# Patient Record
Sex: Female | Born: 1935 | Race: White | Hispanic: No | Smoking: Never smoker
Health system: Southern US, Community
[De-identification: ages and names within clinical notes are randomized; demographics above are authoritative.]

## PROBLEM LIST (undated history)

## (undated) DIAGNOSIS — B029 Zoster without complications: Secondary | ICD-10-CM

## (undated) DIAGNOSIS — E785 Hyperlipidemia, unspecified: Secondary | ICD-10-CM

## (undated) DIAGNOSIS — E039 Hypothyroidism, unspecified: Secondary | ICD-10-CM

## (undated) DIAGNOSIS — I340 Nonrheumatic mitral (valve) insufficiency: Secondary | ICD-10-CM

## (undated) DIAGNOSIS — I10 Essential (primary) hypertension: Secondary | ICD-10-CM

## (undated) DIAGNOSIS — S12100A Unspecified displaced fracture of second cervical vertebra, initial encounter for closed fracture: Secondary | ICD-10-CM

## (undated) DIAGNOSIS — F32A Depression, unspecified: Secondary | ICD-10-CM

## (undated) DIAGNOSIS — C4490 Unspecified malignant neoplasm of skin, unspecified: Secondary | ICD-10-CM

## (undated) DIAGNOSIS — R569 Unspecified convulsions: Secondary | ICD-10-CM

## (undated) DIAGNOSIS — M1990 Unspecified osteoarthritis, unspecified site: Secondary | ICD-10-CM

## (undated) DIAGNOSIS — I071 Rheumatic tricuspid insufficiency: Secondary | ICD-10-CM

## (undated) DIAGNOSIS — F329 Major depressive disorder, single episode, unspecified: Secondary | ICD-10-CM

## (undated) DIAGNOSIS — I452 Bifascicular block: Secondary | ICD-10-CM

## (undated) DIAGNOSIS — S7292XA Unspecified fracture of left femur, initial encounter for closed fracture: Secondary | ICD-10-CM

## (undated) DIAGNOSIS — I272 Pulmonary hypertension, unspecified: Secondary | ICD-10-CM

## (undated) DIAGNOSIS — H547 Unspecified visual loss: Secondary | ICD-10-CM

## (undated) DIAGNOSIS — S73004A Unspecified dislocation of right hip, initial encounter: Secondary | ICD-10-CM

## (undated) DIAGNOSIS — I4891 Unspecified atrial fibrillation: Secondary | ICD-10-CM

## (undated) DIAGNOSIS — S7291XA Unspecified fracture of right femur, initial encounter for closed fracture: Secondary | ICD-10-CM

## (undated) DIAGNOSIS — I5042 Chronic combined systolic (congestive) and diastolic (congestive) heart failure: Secondary | ICD-10-CM

## (undated) DIAGNOSIS — R42 Dizziness and giddiness: Secondary | ICD-10-CM

## (undated) HISTORY — PX: BREAST CYST EXCISION: SHX579

## (undated) HISTORY — PX: TOTAL KNEE ARTHROPLASTY: SHX125

## (undated) HISTORY — PX: TOTAL HIP ARTHROPLASTY: SHX124

## (undated) HISTORY — PX: ROTATOR CUFF REPAIR: SHX139

## (undated) HISTORY — DX: Major depressive disorder, single episode, unspecified: F32.9

## (undated) HISTORY — DX: Hyperlipidemia, unspecified: E78.5

## (undated) HISTORY — PX: ABDOMINAL HYSTERECTOMY: SHX81

## (undated) HISTORY — PX: CHOLECYSTECTOMY: SHX55

## (undated) HISTORY — PX: CATARACT EXTRACTION, BILATERAL: SHX1313

## (undated) HISTORY — DX: Zoster without complications: B02.9

## (undated) HISTORY — DX: Unspecified malignant neoplasm of skin, unspecified: C44.90

## (undated) HISTORY — DX: Depression, unspecified: F32.A

## (undated) HISTORY — DX: Unspecified convulsions: R56.9

## (undated) HISTORY — PX: OTHER SURGICAL HISTORY: SHX169

## (undated) HISTORY — DX: Unspecified osteoarthritis, unspecified site: M19.90

## (undated) HISTORY — PX: CARPAL TUNNEL RELEASE: SHX101

---
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
## 1996-10-11 HISTORY — PX: SHOULDER ARTHROSCOPY: SHX128

## 1998-02-03 ENCOUNTER — Other Ambulatory Visit: Admission: RE | Admit: 1998-02-03 | Discharge: 1998-02-03 | Payer: Self-pay | Admitting: Endocrinology

## 1998-08-28 ENCOUNTER — Ambulatory Visit (HOSPITAL_COMMUNITY): Admission: RE | Admit: 1998-08-28 | Discharge: 1998-08-28 | Payer: Self-pay | Admitting: *Deleted

## 1999-02-05 ENCOUNTER — Other Ambulatory Visit: Admission: RE | Admit: 1999-02-05 | Discharge: 1999-02-05 | Payer: Self-pay | Admitting: Endocrinology

## 1999-07-05 ENCOUNTER — Encounter: Payer: Self-pay | Admitting: Emergency Medicine

## 1999-07-05 ENCOUNTER — Emergency Department (HOSPITAL_COMMUNITY): Admission: EM | Admit: 1999-07-05 | Discharge: 1999-07-05 | Payer: Self-pay | Admitting: Emergency Medicine

## 1999-07-10 ENCOUNTER — Inpatient Hospital Stay (HOSPITAL_COMMUNITY): Admission: EM | Admit: 1999-07-10 | Discharge: 1999-07-11 | Payer: Self-pay | Admitting: Emergency Medicine

## 1999-07-11 ENCOUNTER — Encounter: Payer: Self-pay | Admitting: *Deleted

## 1999-07-11 ENCOUNTER — Encounter: Payer: Self-pay | Admitting: Emergency Medicine

## 2000-02-03 ENCOUNTER — Ambulatory Visit (HOSPITAL_COMMUNITY): Admission: RE | Admit: 2000-02-03 | Discharge: 2000-02-03 | Payer: Self-pay | Admitting: Endocrinology

## 2000-02-03 ENCOUNTER — Encounter: Payer: Self-pay | Admitting: Endocrinology

## 2000-03-01 ENCOUNTER — Other Ambulatory Visit: Admission: RE | Admit: 2000-03-01 | Discharge: 2000-03-01 | Payer: Self-pay | Admitting: Endocrinology

## 2001-02-14 ENCOUNTER — Ambulatory Visit (HOSPITAL_COMMUNITY): Admission: RE | Admit: 2001-02-14 | Discharge: 2001-02-14 | Payer: Self-pay | Admitting: Family Medicine

## 2001-02-14 ENCOUNTER — Encounter: Payer: Self-pay | Admitting: Family Medicine

## 2002-08-13 ENCOUNTER — Ambulatory Visit (HOSPITAL_COMMUNITY): Admission: RE | Admit: 2002-08-13 | Discharge: 2002-08-13 | Payer: Self-pay | Admitting: *Deleted

## 2002-08-13 ENCOUNTER — Encounter: Payer: Self-pay | Admitting: *Deleted

## 2003-05-31 ENCOUNTER — Emergency Department (HOSPITAL_COMMUNITY): Admission: EM | Admit: 2003-05-31 | Discharge: 2003-05-31 | Payer: Self-pay | Admitting: Emergency Medicine

## 2003-09-26 ENCOUNTER — Encounter: Admission: RE | Admit: 2003-09-26 | Discharge: 2003-09-26 | Payer: Self-pay | Admitting: Orthopedic Surgery

## 2004-03-13 ENCOUNTER — Ambulatory Visit (HOSPITAL_COMMUNITY): Admission: RE | Admit: 2004-03-13 | Discharge: 2004-03-13 | Payer: Self-pay | Admitting: Family Medicine

## 2004-03-19 ENCOUNTER — Encounter: Admission: RE | Admit: 2004-03-19 | Discharge: 2004-03-19 | Payer: Self-pay | Admitting: Family Medicine

## 2004-07-16 ENCOUNTER — Encounter: Admission: RE | Admit: 2004-07-16 | Discharge: 2004-07-16 | Payer: Self-pay | Admitting: Orthopedic Surgery

## 2004-07-22 ENCOUNTER — Ambulatory Visit (HOSPITAL_BASED_OUTPATIENT_CLINIC_OR_DEPARTMENT_OTHER): Admission: RE | Admit: 2004-07-22 | Discharge: 2004-07-22 | Payer: Self-pay | Admitting: Orthopedic Surgery

## 2004-07-22 ENCOUNTER — Ambulatory Visit (HOSPITAL_COMMUNITY): Admission: RE | Admit: 2004-07-22 | Discharge: 2004-07-22 | Payer: Self-pay | Admitting: Orthopedic Surgery

## 2005-04-06 ENCOUNTER — Ambulatory Visit (HOSPITAL_COMMUNITY): Admission: RE | Admit: 2005-04-06 | Discharge: 2005-04-06 | Payer: Self-pay | Admitting: Family Medicine

## 2005-07-22 ENCOUNTER — Inpatient Hospital Stay (HOSPITAL_COMMUNITY): Admission: EM | Admit: 2005-07-22 | Discharge: 2005-07-24 | Payer: Self-pay | Admitting: Emergency Medicine

## 2006-05-26 ENCOUNTER — Ambulatory Visit (HOSPITAL_COMMUNITY): Admission: RE | Admit: 2006-05-26 | Discharge: 2006-05-26 | Payer: Self-pay | Admitting: Family Medicine

## 2007-03-23 ENCOUNTER — Ambulatory Visit (HOSPITAL_COMMUNITY): Admission: EM | Admit: 2007-03-23 | Discharge: 2007-03-23 | Payer: Self-pay | Admitting: Emergency Medicine

## 2007-06-26 ENCOUNTER — Ambulatory Visit (HOSPITAL_COMMUNITY): Admission: RE | Admit: 2007-06-26 | Discharge: 2007-06-26 | Payer: Self-pay | Admitting: Family Medicine

## 2008-07-18 ENCOUNTER — Ambulatory Visit (HOSPITAL_COMMUNITY): Admission: RE | Admit: 2008-07-18 | Discharge: 2008-07-18 | Payer: Self-pay | Admitting: Family Medicine

## 2010-05-09 ENCOUNTER — Inpatient Hospital Stay (HOSPITAL_COMMUNITY)
Admission: EM | Admit: 2010-05-09 | Discharge: 2010-05-10 | Payer: Self-pay | Source: Home / Self Care | Admitting: Emergency Medicine

## 2010-11-01 ENCOUNTER — Encounter: Payer: Self-pay | Admitting: Family Medicine

## 2010-12-26 LAB — APTT: aPTT: 35 seconds (ref 24–37)

## 2010-12-26 LAB — BASIC METABOLIC PANEL
BUN: 14 mg/dL (ref 6–23)
CO2: 31 mEq/L (ref 19–32)
Calcium: 8.7 mg/dL (ref 8.4–10.5)
Creatinine, Ser: 0.6 mg/dL (ref 0.4–1.2)
GFR calc Af Amer: >60 mL/min (ref >60)

## 2010-12-26 LAB — CBC
HCT: 37.5 % (ref 36.0–46.0)
HCT: 40.5 % (ref 36.0–46.0)
Hemoglobin: 12.9 g/dL (ref 12.0–15.0)
Hemoglobin: 13.6 g/dL (ref 12.0–15.0)
MCH: 32.6 pg (ref 26.0–34.0)
MCHC: 33.7 g/dL (ref 30.0–36.0)
MCHC: 34.4 g/dL (ref 30.0–36.0)
MCV: 96.9 fL (ref 78.0–100.0)
Platelets: 160 10*3/uL (ref 150–400)
RBC: 3.89 MIL/uL (ref 3.87–5.11)
RBC: 4.18 MIL/uL (ref 3.87–5.11)
RDW: 14 % (ref 11.5–15.5)
WBC: 4.6 10*3/uL (ref 4.0–10.5)

## 2010-12-26 LAB — COMPREHENSIVE METABOLIC PANEL
ALT: 16 U/L (ref 0–35)
AST: 27 U/L (ref 0–37)
Albumin: 3.7 g/dL (ref 3.5–5.2)
Alkaline Phosphatase: 81 U/L (ref 39–117)
BUN: 16 mg/dL (ref 6–23)
CO2: 31 mEq/L (ref 19–32)
Calcium: 9 mg/dL (ref 8.4–10.5)
Chloride: 99 mEq/L (ref 96–112)
Creatinine, Ser: 0.71 mg/dL (ref 0.4–1.2)
GFR calc Af Amer: >60 mL/min (ref >60)
GFR calc non Af Amer: >60 mL/min (ref >60)
Glucose, Bld: 109 mg/dL — ABNORMAL HIGH (ref 70–99)
Potassium: 3.6 mEq/L (ref 3.5–5.1)
Sodium: 139 mEq/L (ref 135–145)
Total Bilirubin: 0.5 mg/dL (ref 0.3–1.2)
Total Protein: 6.3 g/dL (ref 6.0–8.3)

## 2010-12-26 LAB — TYPE AND SCREEN
ABO/RH(D): A POS
Antibody Screen: NEGATIVE

## 2010-12-26 LAB — DIFFERENTIAL
Basophils Absolute: 0 10*3/uL (ref 0.0–0.1)
Basophils Relative: 1 % (ref 0–1)
Eosinophils Absolute: 0.3 10*3/uL (ref 0.0–0.7)
Eosinophils Relative: 6 % — ABNORMAL HIGH (ref 0–5)
Lymphocytes Relative: 36 % (ref 12–46)
Lymphs Abs: 1.6 10*3/uL (ref 0.7–4.0)
Monocytes Absolute: 0.4 10*3/uL (ref 0.1–1.0)
Monocytes Relative: 9 % (ref 3–12)
Neutro Abs: 2.3 10*3/uL (ref 1.7–7.7)
Neutrophils Relative %: 49 % (ref 43–77)

## 2010-12-26 LAB — PROTIME-INR
INR: 1.02 (ref 0.00–1.49)
Prothrombin Time: 13.3 seconds (ref 11.6–15.2)

## 2010-12-26 LAB — TSH
TSH: 5.456 u[IU]/mL — ABNORMAL HIGH (ref 0.350–4.500)
TSH: 6.341 u[IU]/mL — ABNORMAL HIGH (ref 0.350–4.500)

## 2010-12-26 LAB — SAMPLE TO BLOOD BANK

## 2010-12-26 LAB — ABO/RH: ABO/RH(D): A POS

## 2010-12-26 LAB — HEMOCCULT GUIAC POC 1CARD (OFFICE): Fecal Occult Bld: POSITIVE

## 2010-12-26 LAB — HEMOGLOBIN AND HEMATOCRIT, BLOOD
HCT: 38 % (ref 36.0–46.0)
HCT: 38.4 % (ref 36.0–46.0)
Hemoglobin: 13 g/dL (ref 12.0–15.0)
Hemoglobin: 13.1 g/dL (ref 12.0–15.0)

## 2010-12-26 LAB — PHENYTOIN LEVEL, TOTAL: Phenytoin Lvl: 9.7 ug/mL — ABNORMAL LOW (ref 10.0–20.0)

## 2011-02-23 NOTE — Op Note (Signed)
Erica [name], Erica [name]        ACCOUNT NO.:  [id number]   MEDICAL RECORD NO.:  [id number]          PATIENT TYPE:  INP   LOCATION:  0098                         FACILITY:  [facility]   PHYSICIAN:  Madlyn Frankel. Charlann Boxer, M.D.  DATE OF BIRTH:  10-04-1917   DATE OF PROCEDURE:  03/23/2007  DATE OF DISCHARGE:                               OPERATIVE REPORT   PREOPERATIVE DIAGNOSIS:  Dislocation right total hip replacement.   POSTOPERATIVE DIAGNOSIS:  Dislocation right total hip replacement.   PROCEDURE:  Closed reduction right total hip replacement.   SURGEON:  Madlyn Frankel. Charlann Boxer, M.D.   ASSISTANT:  None.   ANESTHESIA:  MAC.   COMPLICATIONS:  None.   INDICATIONS FOR PROCEDURE:  [name] is an 77 year old female with  a history of right total hip replacement performed by Dr. Ranee Gosselin.  She was out in her garden today when she bent down pick some  leaves up and felt a popping type sensation.  Her daughter lives next to  her and brought her to the emergency room after finding her down.  Radiographs note dislocation of the hip. She was seen and evaluated in  the emergency room  and brought to the operating room for closed  reduction.  The risks and benefits were discussed.   PROCEDURE IN DETAIL:  The patient was brought to the operating theater.  Once adequate anesthesia was established, flexion and internal rotation  of the hip and in line traction reduced the hip without complications.  Leg lengths were normal.  Hip range of motion was normal.  She was  placed in a knee immobilizer.  She was then awakened from anesthesia and  brought to the recovery room in stable condition.   I will plan for her to follow up in approximately 3-4 weeks with Dr.  Ranee Gosselin postoperatively.  She will be able to be discharged home.  She will be wearing the knee immobilizer full time until she is followed  up.      Madlyn Frankel Charlann Boxer, M.D.  Electronically Signed     MDO/MEDQ  D:   03/23/2007  T:  03/23/2007  Job:  [id number]   cc:   Windy Fast A. Darrelyn Hillock, M.D.  Fax: [phone]
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
## 2011-02-26 NOTE — Op Note (Signed)
Erica [name], Erica [name]        ACCOUNT NO.:  [id number]   MEDICAL RECORD NO.:  [id number]          PATIENT TYPE:  AMB   LOCATION:  NESC                         FACILITY:  [facility]   PHYSICIAN:  Georges Lynch. Gioffre, M.D.DATE OF BIRTH:  Jan 19, 1917   DATE OF PROCEDURE:  07/22/2004  DATE OF DISCHARGE:                                 OPERATIVE REPORT   PREOPERATIVE DIAGNOSIS:  Severe carpal tunnel syndrome on the left.   POSTOPERATIVE DIAGNOSIS:  Severe carpal tunnel syndrome on the left.   PROCEDURE:  Left carpal tunnel release.   SURGEON:  Georges Lynch. Darrelyn Hillock, M.D.   ASSISTANT:  Nurse.   ANESTHESIA:  General.   DESCRIPTION OF PROCEDURE:  Under general anesthesia, routine orthopedic  prepping and draping of the left upper extremity was carried out.  I  exsanguinated with the left arm with an Esmarch and elevated the tourniquet  to 225 mmHg.  At this time, a curvilinear incision was made over the flexor  aspect of the left wrist.  The few superficial bleeders were identified and  cauterized with the bipolar. Great care was taken not to injure the  underlying neurovascular structures.   I identified the superficial and deep transverse carpal ligaments.  I  protected the median nerve.  I incised the ligaments down into the palm.  The nerve now was free proximally and distally. I thoroughly irrigated out  the area and closed the skin only with 2-0 and 3-0 nylon sutures.  Sterile  Neosporin bundle dressings were applied.  The patient left the operating  room in satisfactory condition.      RAG/MEDQ  D:  07/22/2004  T:  07/22/2004  Job:  [id number]
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
## 2011-05-14 ENCOUNTER — Encounter (INDEPENDENT_AMBULATORY_CARE_PROVIDER_SITE_OTHER): Payer: Self-pay | Admitting: Surgery

## 2011-05-17 ENCOUNTER — Encounter (INDEPENDENT_AMBULATORY_CARE_PROVIDER_SITE_OTHER): Payer: Self-pay | Admitting: Surgery

## 2011-05-17 ENCOUNTER — Ambulatory Visit (INDEPENDENT_AMBULATORY_CARE_PROVIDER_SITE_OTHER): Payer: Medicare Other | Admitting: Surgery

## 2011-05-17 VITALS — BP 142/78 | HR 58 | Temp 97.2°F | Ht 64.0 in | Wt 173.2 lb

## 2011-05-17 DIAGNOSIS — L989 Disorder of the skin and subcutaneous tissue, unspecified: Secondary | ICD-10-CM

## 2011-05-17 NOTE — Progress Notes (Signed)
Chief Complaint  Patient presents with  . Other    new pt-cyst on rt calf    Erica [name] is a 77 y.o. white female who is a patient of Erica Hoit, MD and comes to me today for right posterior skin lesion.  The patient has had multiple prior skin cancers. She saw Dr. Leta [name] for a lesion on her posterior right leg. She's had this lesion for several months but were recently decided to have it addressed.  She's had some swelling of her right leg she says only for 3 weeks as this lesion has flared up.  She is accompanied by her daughter. Her daughter is morbidly obese and in a wheelchair.  Past Medical History  Diagnosis Date  . Hypertension   . Hemorrhoids   . Thyroid disease   . Osteoarthritis   . Seizures   . Depression   . Hyperlipidemia   . Wears glasses   . Cancer     skin  . Bruises easily     Past Surgical History  Procedure Date  . Cataract extraction, bilateral   . Carpal tunnel release     left hand  . Shoulder arthroscopy 1998    right  . Total hip arthroplasty     right  . Total knee arthroplasty     right   . Rotator cuff repair     left shoulder  . Total knee arthroplasty     left   . Cholecystectomy   . Breast cyst excision     left  . Abdominal hysterectomy   . Tonsellectomy     Current Outpatient Prescriptions  Medication Sig Dispense Refill  . amLODipine (NORVASC) 2.5 MG tablet Take 2.5 mg by mouth daily.        . B Complex-C (B-COMPLEX WITH VITAMIN C) tablet Take 1 tablet by mouth daily.        . calcium-vitamin D (OSCAL WITH D) 250-125 MG-UNIT per tablet Take 1 tablet by mouth daily.        . fexofenadine (ALLEGRA) 60 MG tablet Take 60 mg by mouth daily.        . Multiple Vitamins-Minerals (MULTIVITAMIN WITH MINERALS) tablet Take 1 tablet by mouth daily.        Marland Kitchen PARoxetine (PAXIL) 20 MG tablet Take 29 mg by mouth every morning.       . phenytoin (DILANTIN) 200 MG ER capsule Take 100 mg by mouth 2 (two) times daily.        . simvastatin (ZOCOR) 40 MG tablet Take 40 mg by mouth at bedtime.        Marland Kitchen spironolactone-hydrochlorothiazide (ALDACTAZIDE) 25-25 MG per tablet Take 1 tablet by mouth daily.        . ciprofloxacin (CIPRO) 750 MG tablet 750 mg Daily.      Marland Kitchen docusate sodium (COLACE) 100 MG capsule Take 100 mg by mouth 2 (two) times daily.        . hydrocortisone (ANUSOL-HC) 2.5 % rectal cream Place rectally as needed.        . naproxen sodium (ANAPROX) 220 MG tablet Take 220 mg by mouth 2 (two) times daily with a meal.        . SYNTHROID 112 MCG tablet Daily.        Allergies  Allergen Reactions  . Biaxin   . Detrol (Tolterodine Tartrate)   . Ditropan (Oxybutynin Chloride)   . Prednisone   . Vioxx (Rofecoxib)    Review of Systems:  Skin:  No history of rash.  Has had prior skin cancers. Infection:  No history of hepatitis.  No history of MRSA. Neurologic:  History of seizure in 2000.  Followed by [facility] (? Physician).  Erica [name], [place].  No history of headaches. History of depression (though she says she is not) Cardiac:  Hypertension for 10 years. No history of heart disease.  No history of prior cardiac catheterization.  No history of seeing a cardiologist. Pulmonary:  Does not smoke cigarettes.  No asthma or bronchitis.  No OSA/CPAP.  Endocrine:  No diabetes. On thyroid replacement.  Has hypercholesterolemia. Gastrointestinal:  No history of stomach disease.  No history of liver disease.  Cholecystectomy - 1979.  No history of pancreas disease.  No history of colon disease. Urologic:  No history of kidney stones.  No history of bladder infections. Musculoskeletal:  Has had bilateral knee replacement (1988 - left, 1991 - right). Hematologic:  No bleeding disorder.  No history of anemia.  Not anticoagulated.   SOCIAL and FAMILY HISTORY:  PHYSICAL EXAM: BP 142/78  Pulse 58  Temp 97.2 F (36.2 C)  Ht 5\' 4"  (1.626 m)  Wt 173 lb 4 oz (78.586 kg)  BMI 29.74 kg/m2  HEENT: Normal.  Pupils equal. Normal dentition.  Scars on face from prior excisions. Neck: Supple. No thyroid mass. Lymph Nodes:  No supraclavicular or cervical nodes. Lungs: Clear and symmetric. Heart:  RRR. No murmur. Abdomen: No mass. No tenderness. No hernia. Normal bowel sounds.  No abdominal scars. Rectal: Not done. Extremities:  Edema right leg > left leg.  2.0 x 3.5 cm lesion on posterior right leg. [photo taken]  Multiple scars from skin excisions. Neurologic:  Grossly intact to motor and sensory function.  Users a cane for balance.   DATA REVIEWED: Typed patient notes and Dr. Loyce [name] notes.  ASSESSMENT and PLAN: 1.  Right posterior skin lesion.  No pathology.  Probable SCCa.  Discussed surgery.  Risks include bleeding, wound problems, and inadequate excision.  2.  History of seizures. Remote. 3.  Hypertension. 4.  Hypercholesterolism. 5.  Thyroid replacement. 6.  Osteoarthritis.

## 2011-05-25 ENCOUNTER — Ambulatory Visit (HOSPITAL_BASED_OUTPATIENT_CLINIC_OR_DEPARTMENT_OTHER)
Admission: RE | Admit: 2011-05-25 | Discharge: 2011-05-25 | Disposition: A | Discharge status: Home / Self Care | Payer: Medicare Other | Source: Ambulatory Visit | Attending: Surgery | Admitting: Surgery

## 2011-05-25 ENCOUNTER — Other Ambulatory Visit (INDEPENDENT_AMBULATORY_CARE_PROVIDER_SITE_OTHER): Payer: Self-pay | Admitting: Surgery

## 2011-05-25 DIAGNOSIS — C44721 Squamous cell carcinoma of skin of unspecified lower limb, including hip: Secondary | ICD-10-CM

## 2011-05-25 HISTORY — PX: SKIN LESION EXCISION: SHX2412

## 2011-06-01 NOTE — Op Note (Signed)
  NAMECENIYAH, THORP          ACCOUNT NO.:  [id number]  MEDICAL RECORD NO.:  [id number]  LOCATION:                                 FACILITY:  PHYSICIAN:  Sandria Bales. Ezzard Standing, M.D.  DATE OF BIRTH:  [birth year]  DATE OF PROCEDURE: 25 May 2011                              OPERATIVE REPORT  PREOP DIAGNOSIS:  Lesion, posterior right calf.  POSTOP DIAGNOSIS:  A 2 x 3 cm lesion, posterior right calf.  Final path pending.  PROCEDURES:  Excision of lesion, posterior right calf with 8 cm in longest dimension of excision.  SURGEON:  Sandria Bales. Ezzard Standing, MD.  FIRST ASSISTANT:  None.  ANESTHESIA:  20 mL of 1% Xylocaine with epinephrine.  COMPLICATIONS:  None.  PROCEDURE:  Ms. Roda Lauture is a 77 year old white female, who sees Dr. Benedetto Goad as her primary medical doctor.  Dr. Leta Speller from a dermatology standpoint has this lesion of her right posterior calf with some swelling over her right leg, and now comes for excision of this lesion.  The indications and potential complications were explained to the patient and her daughter.  The potential complications include bleeding, infection, and inadequate excision of the lesion, and nerve injury.  OPERATIVE NOTE:  The patient in prone position with a right posterior calf prepped with Betadine solution. I cleaned off some dead skin and then reprepped again with Betadine solution.  She has this lesion, which measures about 2 x 3 cm on the right posterior calf, it  looks like a component be part of the sebaceous cyst, although there is some kind of an open ulcer, which was excised in one block.  I infiltrated with 20 mL of 1% Xylocaine with epinephrine.  I then made an incision approximately 8 x 3 cm.  I marked a long suture on the cranial aspect of the lesion, a short suture of the medial aspect to the lesion.  I then undermined the skin back about 2 cm on both sides so the skin would pull together.  I used 2-0 nylon sutures,  her skin is fairly fragile.  I got the wound closure to where the skin edges were approximated under a moderate amount of tension.  The wound looked good.  I put a gauze over the incision and then wrapped the leg with a 4-inch Kerlix and then a 6-inch Ace bandage.  She will keep this on her leg for least 3 days, keep her leg elevated. She does have some chronic edema, maybe 1+ of the right leg, this may be coming as far from this inflammation.  I think elevation will help keep this down.  She will see me back in 1 week for wound check.  She can shower in 3 days.    Sandria Bales. Ezzard Standing, M.D., FACS   DHN/MEDQ  D:  05/25/2011  T:  05/26/2011  Job:  [id number]  cc:   Gloriajean Dell. Andrey Campanile, M.D. Norval Gable. [place], M.D.  Electronically Signed by Ovidio Kin M.D. on 06/01/2011 29:56:21 PM

## 2011-06-02 ENCOUNTER — Ambulatory Visit (INDEPENDENT_AMBULATORY_CARE_PROVIDER_SITE_OTHER): Payer: Medicare Other | Admitting: Surgery

## 2011-06-02 ENCOUNTER — Encounter (INDEPENDENT_AMBULATORY_CARE_PROVIDER_SITE_OTHER): Payer: Self-pay | Admitting: Surgery

## 2011-06-02 VITALS — BP 156/68 | HR 68 | Temp 97.6°F | Wt 174.0 lb

## 2011-06-02 DIAGNOSIS — C4492 Squamous cell carcinoma of skin, unspecified: Secondary | ICD-10-CM

## 2011-06-02 NOTE — Patient Instructions (Signed)
Continue local wound care with soap and water and wrapping leg.  See me back in one week.

## 2011-06-02 NOTE — Progress Notes (Signed)
Chief Complaint  Patient presents with  . Other    new pt-cyst on rt calf    Erica [name] is a 77 y.o. white female who is a patient of Pamelia Hoit, MD and comes follow-up of a right posterior skin lesion that I excised 05/25/2011.  The patient has had multiple prior skin cancers. She saw Dr. Leta Speller for a lesion on her posterior right leg.  He referred her for me to see.  I did an excision of this lesion on 05/25/2011.  Her final path showed a well differentiated squamous cell carcinoma.  Tumor was present at the medial margin.  The patient has done a good job of taking care of the wound.   (She is accompanied by her daughter. Her daughter is morbidly obese and in a wheelchair.)  PHYSICAL EXAM: BP 156/68  Pulse 68  Temp(Src) 97.6 F (36.4 C) (Temporal)  Wt 174 lb (78.926 kg)  Right LEG: She has had some breakdown of the incision, I think this is just secondary to her age and tension on the repair.  I removed one suture.  I will leave the other sutures until I see her next week in the office.  ASSESSMENT AND PLAN: 1.  Squamous cell ca of right calf.    Wound is doing okay.  Will probably have some central breakdown.  Medial margin involved.  Will watch wound and decide if to proceed with wider excision now or later.  I gave the patient a copy of her path report.  2. History of seizures. Remote.  3. Hypertension.  4. Hypercholesterolism.  5. Thyroid replacement.  6. Osteoarthritis.

## 2011-06-09 ENCOUNTER — Ambulatory Visit (INDEPENDENT_AMBULATORY_CARE_PROVIDER_SITE_OTHER): Payer: Medicare Other | Admitting: Surgery

## 2011-06-09 DIAGNOSIS — C44721 Squamous cell carcinoma of skin of unspecified lower limb, including hip: Secondary | ICD-10-CM

## 2011-06-09 NOTE — Progress Notes (Signed)
Chief Complaint  Patient presents with  . Other    new pt-cyst on rt calf    Erica [name] is a 77 y.o. white female who is a patient of Pamelia Hoit, MD and comes follow-up of a right posterior skin lesion that I excised 05/25/2011.  The patient has had multiple prior skin cancers. She saw Dr. Leta Speller for a lesion on her posterior right leg.  He referred her for me to see.  I did an excision of this lesion on 05/25/2011.  Her final path showed a well differentiated squamous cell carcinoma.  Tumor was present at the medial margin.  She is doing well.  She has been up cleaning around her house.  The patient has done a good job of taking care of the wound.   (She is accompanied by her daughter. Her daughter is morbidly obese and in a wheelchair.)  PHYSICAL EXAM: BP 156/68  Pulse 68  Temp(Src) 97.6 F (36.4 C) (Temporal)  Wt 174 lb (78.926 kg)  Right LEG: Wound looks good.  I removed the remainder of the sutures.  ASSESSMENT AND PLAN: 1.  Squamous cell ca of right calf.    She'll continue local wound care.  I want to see her back in 2 weeks to check the incision.   Then I'll probably see her in 4 - 6 months for follow up wound check.  With the positive margin, she understands there is a risk of local recurrence.  2. History of seizures. Remote.  3. Hypertension.  4. Hypercholesterolism.  5. Thyroid replacement.  6. Osteoarthritis.

## 2011-06-09 NOTE — Patient Instructions (Signed)
Continue local wound care.  

## 2011-06-25 ENCOUNTER — Ambulatory Visit (INDEPENDENT_AMBULATORY_CARE_PROVIDER_SITE_OTHER): Payer: Medicare Other | Admitting: Surgery

## 2011-06-25 VITALS — BP 130/82 | HR 66 | Wt 172.0 lb

## 2011-06-25 DIAGNOSIS — Z8589 Personal history of malignant neoplasm of other organs and systems: Secondary | ICD-10-CM | POA: Insufficient documentation

## 2011-06-25 NOTE — Progress Notes (Signed)
Erica [name] is a 77 y.o. white female who is a patient of Pamelia Hoit, MD and comes follow-up of a right posterior skin lesion that I excised 05/25/2011.   The patient has had multiple prior skin cancers. She saw Dr. Leta Speller for a lesion on her posterior right leg. He referred her for me to see.  I did an excision of this lesion on 05/25/2011. Her final path showed a well differentiated squamous cell carcinoma. Tumor was present at the medial margin. She is doing well. She has been up cleaning around her house.   The patient has done a good job of taking care of the wound.   (She is accompanied by her daughter. Her daughter is morbidly obese and in a wheelchair.)   PHYSICAL EXAM:  BP 130/82  Pulse 66  Wt 172 lb (78.019 kg)   Right LEG: Wound looks good.  It is almost healed.   ASSESSMENT AND PLAN:  1. Squamous cell ca of right calf.   The wound is almost entirely healed and looks better than I wound have expected.  She'll continue local wound care.  I want to see her in 6 months for follow up wound check. With the positive margin, she understands there is a risk of local recurrence.   2. History of seizures. Remote.  3. Hypertension.  4. Hypercholesterolism.  5. Thyroid replacement.  6. Osteoarthritis.

## 2011-07-29 LAB — COMPREHENSIVE METABOLIC PANEL
ALT: 19
AST: 29
Calcium: 9.6
GFR calc Af Amer: >60
Sodium: 140
Total Protein: 6.3

## 2011-07-29 LAB — URINALYSIS, ROUTINE W REFLEX MICROSCOPIC
Glucose, UA: NEGATIVE
Hgb urine dipstick: NEGATIVE
Ketones, ur: NEGATIVE
Protein, ur: NEGATIVE

## 2011-07-29 LAB — CBC
HCT: 41.9
Hemoglobin: 14.1
MCV: 95
RDW: 13.9
WBC: 7

## 2011-07-29 LAB — DIFFERENTIAL
Eosinophils Absolute: 0.1
Eosinophils Relative: 2
Lymphs Abs: 1.3
Monocytes Relative: 7

## 2011-08-18 ENCOUNTER — Encounter (INDEPENDENT_AMBULATORY_CARE_PROVIDER_SITE_OTHER): Payer: Self-pay

## 2011-08-25 ENCOUNTER — Encounter (INDEPENDENT_AMBULATORY_CARE_PROVIDER_SITE_OTHER): Payer: Medicare Other | Admitting: Surgery

## 2011-10-06 ENCOUNTER — Telehealth (INDEPENDENT_AMBULATORY_CARE_PROVIDER_SITE_OTHER): Payer: Self-pay

## 2011-10-06 NOTE — Telephone Encounter (Signed)
Pt called today c/o swelling and tightness in her leg.  She has hx of squamous cell carcinoma.  I advised her to call her PCP today to be seen ASAP.  They should then contact us if she needs to be seen by Dr. Ezzard Standing.

## 2011-10-27 ENCOUNTER — Encounter (INDEPENDENT_AMBULATORY_CARE_PROVIDER_SITE_OTHER): Payer: Self-pay | Admitting: Surgery

## 2011-10-27 ENCOUNTER — Ambulatory Visit (INDEPENDENT_AMBULATORY_CARE_PROVIDER_SITE_OTHER): Payer: Medicare Other | Admitting: Surgery

## 2011-10-27 DIAGNOSIS — L989 Disorder of the skin and subcutaneous tissue, unspecified: Secondary | ICD-10-CM

## 2011-10-27 DIAGNOSIS — Z8589 Personal history of malignant neoplasm of other organs and systems: Secondary | ICD-10-CM

## 2011-10-27 NOTE — Progress Notes (Signed)
[facility]  Kensington Rios, MD,  FACS [address]  [address] [place], [place]    [address] Phone:  [phone] FAX:  [phone]   Re:   Erica [name] DOB:   04/13/1917 MRN:   [id number]  ASSESSMENT AND PLAN: 1.  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  I discussed with patient about re-excising this.   Will do under local.  I will do one leg at a time.  Since this was a known SCCa, will do the right leg first.  I discussed there is still the possibility that the margins could be involved.  2.  Lesion left calf, posterior lateral, possible SCCa.  Will excise this 4 - 8 weeks after the right leg excision, depending on how the incisions do.  3.  History of seizures. In 2000 after fall at [facility].  No further seizures, though she is still on Dilantin. 4.  Hypertension.  5.  Hypercholesterolism.  6.  Thyroid replacement.  7.  Osteoarthritis. 8.  Left hip/leg pain after recent fall.  To see Dr. Giofre (was a former patient of Dr. Carter).  HISTORY OF PRESENT ILLNESS: Chief Complaint  Patient presents with  . Follow-up    rt leg wound not healed after 5 mo    Erica [name] is a 78 y.o. (DOB: 08/19/1917)  white female who is a patient of WILSON,FRED HENRY, MD, MD and comes to me today for evaluation of left leg lesion.  She also has a lesion on the right leg that has come up since she saw Dr. Wilson.  I received a full office note from Dr. Wilson.  I excised a 2 x 3 cm right posterior calf lesion on 05/23/2011.  It was a SCCa, but the margins were positive.  She said the wound never healed.  She has recently fallen and thought she is walking, her left leg/hip bothers her.  She is accompanied with her son in law, Bob [name].   PHYSICAL EXAM: BP 128/76  Pulse 72  Temp(Src) 96.9 F (36.1 C) (Temporal)  Resp 18  Ht 5' 4" (1.626 m)  Wt 174 lb (78.926 kg)  BMI 29.87 kg/m2  General:  Older WF who is slow moving, but  alert and able to get on the exam table by herself.  Legs:  1.2 cm right posterior calf lesion,  2.0 x 1.2 left posterior calf lesion [photos were taken]  DATA REVIEWED: Old path report  Eleaner Dibartolo, MD, FACS Office:  [phone]  

## 2011-11-11 ENCOUNTER — Other Ambulatory Visit (INDEPENDENT_AMBULATORY_CARE_PROVIDER_SITE_OTHER): Payer: Self-pay | Admitting: Surgery

## 2011-11-11 ENCOUNTER — Ambulatory Visit (HOSPITAL_BASED_OUTPATIENT_CLINIC_OR_DEPARTMENT_OTHER)
Admission: RE | Admit: 2011-11-11 | Discharge: 2011-11-11 | Disposition: A | Discharge status: Home / Self Care | Payer: Medicare Other | Source: Ambulatory Visit | Attending: Surgery | Admitting: Surgery

## 2011-11-11 ENCOUNTER — Encounter (HOSPITAL_BASED_OUTPATIENT_CLINIC_OR_DEPARTMENT_OTHER)
Admission: RE | Disposition: A | Discharge status: Home / Self Care | Payer: Self-pay | Source: Ambulatory Visit | Attending: Surgery

## 2011-11-11 DIAGNOSIS — C44721 Squamous cell carcinoma of skin of unspecified lower limb, including hip: Secondary | ICD-10-CM | POA: Insufficient documentation

## 2011-11-11 DIAGNOSIS — I10 Essential (primary) hypertension: Secondary | ICD-10-CM | POA: Insufficient documentation

## 2011-11-11 DIAGNOSIS — Z8589 Personal history of malignant neoplasm of other organs and systems: Secondary | ICD-10-CM

## 2011-11-11 DIAGNOSIS — E7800 Pure hypercholesterolemia, unspecified: Secondary | ICD-10-CM | POA: Insufficient documentation

## 2011-11-11 HISTORY — PX: LESION REMOVAL: SHX5196

## 2011-11-11 SURGERY — MINOR EXCISION OF LESION
Anesthesia: LOCAL | Site: Leg Lower | Laterality: Right | Wound class: Clean

## 2011-11-11 MED ORDER — LIDOCAINE-EPINEPHRINE 1 %-1:100000 IJ SOLN
INTRAMUSCULAR | Status: DC | PRN
  Administered 2011-11-11: 15 mL

## 2011-11-11 SURGICAL SUPPLY — 34 items
APL SKNCLS STERI-STRIP NONHPOA (GAUZE/BANDAGES/DRESSINGS)
BANDAGE ADHESIVE 1X3 (GAUZE/BANDAGES/DRESSINGS) IMPLANT
BENZOIN TINCTURE PRP APPL 2/3 (GAUZE/BANDAGES/DRESSINGS) IMPLANT
BLADE SURG 15 STRL LF DISP TIS (BLADE) ×1 IMPLANT
BLADE SURG 15 STRL SS (BLADE) ×2
CLOTH BEACON ORANGE TIMEOUT ST (SAFETY) ×2 IMPLANT
DRAPE UTILITY XL STRL (DRAPES) IMPLANT
GAUZE SPONGE 4X4 12PLY STRL LF (GAUZE/BANDAGES/DRESSINGS) IMPLANT
GLOVE BIO SURGEON STRL SZ 6.5 (GLOVE) ×2 IMPLANT
GLOVE SURG SIGNA 7.5 PF LTX (GLOVE) ×2 IMPLANT
MARKER SKIN DUAL TIP RULER LAB (MISCELLANEOUS) ×2 IMPLANT
NDL HYPO 25X5/8 SAFETYGLIDE (NEEDLE) ×1 IMPLANT
NDL SAFETY ECLIPSE 18X1.5 (NEEDLE) ×1 IMPLANT
NEEDLE HYPO 18GX1.5 SHARP (NEEDLE)
NEEDLE HYPO 25X1 1.5 SAFETY (NEEDLE) IMPLANT
NEEDLE HYPO 25X5/8 SAFETYGLIDE (NEEDLE) ×2 IMPLANT
NS IRRIG 1000ML POUR BTL (IV SOLUTION) IMPLANT
STRIP CLOSURE SKIN 1/2X4 (GAUZE/BANDAGES/DRESSINGS) IMPLANT
STRIP CLOSURE SKIN 1/4X4 (GAUZE/BANDAGES/DRESSINGS) IMPLANT
SUT ETHILON 2 0 FS 18 (SUTURE) ×3 IMPLANT
SUT ETHILON 3 0 PS 1 (SUTURE) IMPLANT
SUT ETHILON 4 0 PS 2 18 (SUTURE) IMPLANT
SUT ETHILON 5 0 P 3 18 (SUTURE)
SUT NYLON ETHILON 5-0 P-3 1X18 (SUTURE) IMPLANT
SUT SILK 2 0 FS (SUTURE) ×2 IMPLANT
SUT VIC AB 3-0 SH 27 (SUTURE) ×2
SUT VIC AB 3-0 SH 27X BRD (SUTURE) ×1 IMPLANT
SUT VIC AB 5-0 PC1 18 (SUTURE) IMPLANT
SUT VICRYL 3-0 CR8 SH (SUTURE) IMPLANT
SUT VICRYL 4-0 PS2 18IN ABS (SUTURE) IMPLANT
SWABSTICK POVIDONE IODINE SNGL (MISCELLANEOUS) ×4 IMPLANT
SYR CONTROL 10ML LL (SYRINGE) ×2 IMPLANT
TOWEL OR NON WOVEN STRL DISP B (DISPOSABLE) ×2 IMPLANT
UNDERPAD 30X30 INCONTINENT (UNDERPADS AND DIAPERS) IMPLANT

## 2011-11-11 NOTE — Interval H&P Note (Signed)
History and Physical Interval Note:  11/11/2011 10:12 AM  Erica [name]  has presented today for surgery, with the diagnosis of right leg lesion  This is probably recurrent SCCa.  Her son is at the bedside (I met her son in law in the office).   The various methods of treatment have been discussed with the patient and family.   After consideration of risks, benefits and other options for treatment, the patient has consented to  Procedure(s):  EXICISION OF LESION on right calf as a surgical intervention .    The patients' history has been reviewed, patient examined, no change in status, stable for surgery.  I have reviewed the patients' chart and labs.  Questions were answered to the patient's satisfaction.     Laketa Sandoz H

## 2011-11-11 NOTE — H&P (View-Only) (Signed)
[facility]  Ovidio Kin, MD,  FACS [address] [place]  [address] [place], [place]    [address] Phone:  [phone] FAX:  [phone]   Re:   Erica [name] DOB:   [birth year] MRN:   [id number]  ASSESSMENT AND PLAN: 1.  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  I discussed with patient about re-excising this.   Will do under local.  I will do one leg at a time.  Since this was a known SCCa, will do the right leg first.  I discussed there is still the possibility that the margins could be involved.  2.  Lesion left calf, posterior lateral, possible SCCa.  Will excise this 4 - 8 weeks after the right leg excision, depending on how the incisions do.  3.  History of seizures. In 2000 after fall at [facility].  No further seizures, though she is still on Dilantin. 4.  Hypertension.  5.  Hypercholesterolism.  6.  Thyroid replacement.  7.  Osteoarthritis. 8.  Left hip/leg pain after recent fall.  To see Dr. Lerry Paterson (was a former patient of Dr. Montez Morita).  HISTORY OF PRESENT ILLNESS: Chief Complaint  Patient presents with  . Follow-up    rt leg wound not healed after 5 mo    Erica [name] is a 78 y.o. (DOB: 1917-06-08)  white female who is a patient of Pamelia Hoit, MD, MD and comes to me today for evaluation of left leg lesion.  She also has a lesion on the right leg that has come up since she saw Dr. Andrey Campanile.  I received a full office note from Dr. Andrey Campanile.  I excised a 2 x 3 cm right posterior calf lesion on 05/23/2011.  It was a SCCa, but the margins were positive.  She said the wound never healed.  She has recently fallen and thought she is walking, her left leg/hip bothers her.  She is accompanied with her son in law, Coy Saunas.   PHYSICAL EXAM: BP 128/76  Pulse 72  Temp(Src) 96.9 F (36.1 C) (Temporal)  Resp 18  Ht 5\' 4"  (1.626 m)  Wt 174 lb (78.926 kg)  BMI 29.87 kg/m2  General:  Older WF who is slow moving, but  alert and able to get on the exam table by herself.  Legs:  1.2 cm right posterior calf lesion,  2.0 x 1.2 left posterior calf lesion [photos were taken]  DATA REVIEWED: Old path report  Ovidio Kin, MD, FACS Office:  [phone]

## 2011-11-11 NOTE — Brief Op Note (Addendum)
11/11/2011  10:52 AM  PATIENT:  Erica [name], 78 y.o., female, MRN: [id number]  PREOP DIAGNOSIS:  right leg lesion  POSTOP DIAGNOSIS:   Probable recurrent SCCa of right posterior calf  PROCEDURE:   Procedure(s): MINOR EXICISION OF LESION  SURGEON:   Ovidio Kin, M.D.  ASSISTANT:   none  ANESTHESIA:   local  EBL:  Minimal  ml  BLOOD ADMINISTERED: none  DRAINS: none   LOCAL MEDICATIONS USED:   15 cc 1% xylocaine  SPECIMEN:   Right posterior calf lesion - long suture distal, short suture anterior  COUNTS CORRECT:  YES  INDICATIONS FOR PROCEDURE:  Erica [name] is a 78 y.o. (DOB: 22-Aug-1917) white female whose primary care physician is Pamelia Hoit, MD, MD and comes for excision of right leg lesion.   The indications and risks of the surgery were explained to the patient.  The risks include, but are not limited to, infection, bleeding, and nerve injury.  Note dictated to:   [id number]  DN

## 2011-11-12 NOTE — Op Note (Signed)
NAMECarolan Clines           ACCOUNT NO.:  [id number]  MEDICAL RECORD NO.:  [id number]  LOCATION:                               FACILITY:  [facility]  PHYSICIAN:  Sandria Bales. Ezzard Standing, M.D.  DATE OF BIRTH:  06/05/1917  DATE OF PROCEDURE:  11/11/2011                               OPERATIVE REPORT  PREOPERATIVE DIAGNOSIS:  Lesion, right lateral posterior calf.  POSTOPERATIVE DIAGNOSIS:  Lesion, right lateral posterior calf, probable recurrent squamous cell carcinoma.  PROCEDURE:  Excision of right calf lesion, excision approximately 2 x 8 cm.  SURGEON:  Sandria Bales. Ezzard Standing, MD  FIRST ASSISTANT:  None.  ANESTHESIA:  Local with 15 cc of 1% Xylocaine with epinephrine.  COMPLICATIONS:  None.  INDICATIONS OF PROCEDURE:  Mr. Edgren is a pleasant 78 year old white female who sees Dr. Benedetto Goad, who has had prior squamous cells of her lower extremities.  She comes with an increasing lesion of her right lateral posterior calf, which is suspicious for recurrent squamous cell carcinoma.  She comes for excision of this lesion.  The indications and potential complications of the procedure were explained to the patient.  DESCRIPTION OF PROCEDURE:  The patient was placed in left lateral decubitus position with the right leg up.  I prepped her right posterior calf with Betadine solution and marked this.  The lesion itself was about 1.5 cm.  I tried to get about 5 mm margins around this.  I excised an ellipse of skin, measuring approximately 8 x 2 cm.  A long suture marked the distal margin of the excision, a short suture marked the anterior margin of the incision, this was sent to Pathology.  The wound was then irrigated with saline.  It was closed with interrupted 2-0 nylon sutures, painted with Betadine ointment, sterilely dressed, and her leg wrapped with a 6-inch Ace gauze.  She will see me back in probably 1 week for just wound check with a plan to leave the sutures in for a total of 3  weeks.   Sandria Bales. Ezzard Standing, M.D., FACS   DHN/MEDQ  D:  11/11/2011  T:  11/11/2011  Job:  [id number]  cc:   Norval Gable. [place], M.D. Fax: [phone]  Gloriajean Dell. Andrey Campanile, M.D. Fax: [phone]

## 2011-11-15 ENCOUNTER — Encounter (HOSPITAL_BASED_OUTPATIENT_CLINIC_OR_DEPARTMENT_OTHER): Payer: Self-pay | Admitting: Surgery

## 2011-11-17 ENCOUNTER — Encounter (INDEPENDENT_AMBULATORY_CARE_PROVIDER_SITE_OTHER): Payer: Self-pay | Admitting: Surgery

## 2011-11-17 ENCOUNTER — Ambulatory Visit (INDEPENDENT_AMBULATORY_CARE_PROVIDER_SITE_OTHER): Payer: Medicare Other | Admitting: Surgery

## 2011-11-17 VITALS — BP 136/68 | HR 76 | Temp 97.3°F | Resp 20 | Ht 63.0 in | Wt 172.0 lb

## 2011-11-17 DIAGNOSIS — Z8589 Personal history of malignant neoplasm of other organs and systems: Secondary | ICD-10-CM

## 2011-11-17 NOTE — Progress Notes (Signed)
[facility]  Ovidio Kin, MD,  FACS [address] [place]  [address] [place], [place]    [address] Phone:  [phone] FAX:  [phone]   Re:   Erica [name] DOB:   1917-03-28 MRN:   [id number]  ASSESSMENT AND PLAN: 1.  Recurrent SCCa of right calf, posterior lateral.  The margins are clear on the path report.  I gave her a copy of the report.  The wound looks good.   I removed a few of the sutures.  I will see her back in 2 weeks to remove the remaining sutures and schedule the left leg lesion excision.  2.  Lesion left calf, posterior lateral, possible SCCa.  Will excise this 4 weeks after the right leg excision. 3.  History of seizures. In 2000 after fall at [facility].  No further seizures, though she is still on Dilantin. 4.  Hypertension.  5.  Hypercholesterolism.  6.  Thyroid replacement.  7.  Osteoarthritis. 8.  Left hip/leg pain after recent fall.  To see Dr. Lerry Paterson (was a former patient of Dr. Montez Morita).  HISTORY OF PRESENT ILLNESS: Chief Complaint  Patient presents with  . Routine Post Op    f/u after leg excision 11/11/11    Erica [name] is a 78 y.o. (DOB: 1917-03-24)  white female who is a patient of Pamelia Hoit, MD, MD and comes to me today follow up of an excision of a right calf SCCa.  The wound looks good and the path report showed the margins were clear.   She is accompanied with her son in law, Coy Saunas.  PHYSICAL EXAM: BP 136/68  Pulse 76  Temp(Src) 97.3 F (36.3 C) (Temporal)  Resp 20  Ht 5\' 3"  (1.6 m)  Wt 172 lb (78.019 kg)  BMI 30.47 kg/m2  General:  Older WF who is slow moving, but alert and able to get on the exam table by herself.  Legs:  Right posterior calf wound looks good.  I removed some sutures.  I will remove the remaining sutures in 2 weeks.  DATA REVIEWED: Current path report.  Ovidio Kin, MD, FACS Office:  [phone]

## 2011-12-01 ENCOUNTER — Encounter (INDEPENDENT_AMBULATORY_CARE_PROVIDER_SITE_OTHER): Payer: Self-pay | Admitting: Surgery

## 2011-12-01 ENCOUNTER — Ambulatory Visit (INDEPENDENT_AMBULATORY_CARE_PROVIDER_SITE_OTHER): Payer: Medicare Other | Admitting: Surgery

## 2011-12-01 DIAGNOSIS — Z8589 Personal history of malignant neoplasm of other organs and systems: Secondary | ICD-10-CM

## 2011-12-01 DIAGNOSIS — L989 Disorder of the skin and subcutaneous tissue, unspecified: Secondary | ICD-10-CM

## 2011-12-01 NOTE — Progress Notes (Signed)
[facility]  Ovidio Kin, MD,  FACS [address] [place]  [address] [place], [place]    [address] Phone:  [phone] FAX:  [phone]   Re:   Erica [name] DOB:   1916-12-07 MRN:   [id number]  ASSESSMENT AND PLAN: 1.  Recurrent SCCa of right calf, posterior lateral.  The margins are clear on the path report.    The wound looks good.   I removed  the remaining sutures.  Her son in law is with her.  His wife (her daughter) has had hip trouble and is at [facility].  This may effect scheduling surgery on the left side.  2.  Lesion left calf, posterior lateral, possible SCCa.  Will schedule to excise this lesion when it is convenient for her. 3.  History of seizures. In 2000 after fall at [facility].  No further seizures, though she is still on Dilantin. 4.  Hypertension.  5.  Hypercholesterolism.  6.  Thyroid replacement.  7.  Osteoarthritis. 8.  Left hip/leg pain after recent fall.  To see Dr. Lerry Paterson (was a former patient of Dr. Montez Morita).  HISTORY OF PRESENT ILLNESS: Chief Complaint  Patient presents with  . Suture / Staple Removal    Erica [name] is a 78 y.o. (DOB: [birth year])  white female who is a patient of Pamelia Hoit, MD, MD and comes to me today follow up of an excision of a right calf SCCa.  The wound looks good and the path report showed the margins were clear.   She is accompanied with her son in law, Coy Saunas.  PHYSICAL EXAM: BP 130/76  Pulse 68  Temp(Src) 97.9 F (36.6 C) (Temporal)  Resp 16  Ht 5\' 5"  (1.651 m)  Wt 170 lb (77.111 kg)  BMI 28.29 kg/m2  General:  Older WF who is slow moving, but alert and able to get on the exam table by herself.  Legs:  Right posterior calf wound looks good.  I removed the remaining sutures.   Left lateral leg:  The lesion is unchanged from what it looked like earlier.  Will plan excision at a time convenient for her.  DATA REVIEWED: None.  Ovidio Kin, MD, FACS Office:   [phone]

## 2011-12-21 ENCOUNTER — Ambulatory Visit (HOSPITAL_BASED_OUTPATIENT_CLINIC_OR_DEPARTMENT_OTHER)
Admission: RE | Admit: 2011-12-21 | Discharge: 2011-12-21 | Disposition: A | Discharge status: Home / Self Care | Payer: Medicare Other | Source: Ambulatory Visit | Attending: Surgery | Admitting: Surgery

## 2011-12-21 ENCOUNTER — Encounter (HOSPITAL_BASED_OUTPATIENT_CLINIC_OR_DEPARTMENT_OTHER)
Admission: RE | Disposition: A | Discharge status: Home / Self Care | Payer: Self-pay | Source: Ambulatory Visit | Attending: Surgery

## 2011-12-21 DIAGNOSIS — C44721 Squamous cell carcinoma of skin of unspecified lower limb, including hip: Secondary | ICD-10-CM

## 2011-12-21 DIAGNOSIS — Z9181 History of falling: Secondary | ICD-10-CM | POA: Insufficient documentation

## 2011-12-21 DIAGNOSIS — E7800 Pure hypercholesterolemia, unspecified: Secondary | ICD-10-CM | POA: Insufficient documentation

## 2011-12-21 DIAGNOSIS — M1990 Unspecified osteoarthritis, unspecified site: Secondary | ICD-10-CM | POA: Insufficient documentation

## 2011-12-21 DIAGNOSIS — I10 Essential (primary) hypertension: Secondary | ICD-10-CM | POA: Insufficient documentation

## 2011-12-21 DIAGNOSIS — M25559 Pain in unspecified hip: Secondary | ICD-10-CM | POA: Insufficient documentation

## 2011-12-21 DIAGNOSIS — Z8589 Personal history of malignant neoplasm of other organs and systems: Secondary | ICD-10-CM

## 2011-12-21 HISTORY — PX: MASS EXCISION: SHX2000

## 2011-12-21 SURGERY — MINOR EXCISION OF MASS
Anesthesia: LOCAL | Site: Leg Lower | Laterality: Left | Wound class: Clean

## 2011-12-21 MED ORDER — LIDOCAINE-EPINEPHRINE 1 %-1:100000 IJ SOLN
INTRAMUSCULAR | Status: DC | PRN
  Administered 2011-12-21: 20 mL

## 2011-12-21 SURGICAL SUPPLY — 33 items
APL SKNCLS STERI-STRIP NONHPOA (GAUZE/BANDAGES/DRESSINGS)
BANDAGE ADHESIVE 1X3 (GAUZE/BANDAGES/DRESSINGS) IMPLANT
BANDAGE ELASTIC 6 VELCRO ST LF (GAUZE/BANDAGES/DRESSINGS) ×1 IMPLANT
BENZOIN TINCTURE PRP APPL 2/3 (GAUZE/BANDAGES/DRESSINGS) IMPLANT
BLADE SURG 15 STRL LF DISP TIS (BLADE) ×1 IMPLANT
BLADE SURG 15 STRL SS (BLADE) ×2
CLOTH BEACON ORANGE TIMEOUT ST (SAFETY) ×2 IMPLANT
DRAPE UTILITY XL STRL (DRAPES) ×2 IMPLANT
GAUZE SPONGE 4X4 12PLY STRL LF (GAUZE/BANDAGES/DRESSINGS) ×6 IMPLANT
GLOVE ECLIPSE 6.5 STRL STRAW (GLOVE) ×2 IMPLANT
GLOVE SURG SIGNA 7.5 PF LTX (GLOVE) ×2 IMPLANT
MARKER SKIN DUAL TIP RULER LAB (MISCELLANEOUS) ×2 IMPLANT
NDL HYPO 25X1 1.5 SAFETY (NEEDLE) IMPLANT
NDL SAFETY ECLIPSE 18X1.5 (NEEDLE) ×1 IMPLANT
NEEDLE HYPO 18GX1.5 SHARP (NEEDLE)
NEEDLE HYPO 25X1 1.5 SAFETY (NEEDLE) ×2 IMPLANT
NEEDLE HYPO 25X5/8 SAFETYGLIDE (NEEDLE) ×2 IMPLANT
NS IRRIG 1000ML POUR BTL (IV SOLUTION) IMPLANT
SPONGE GAUZE 4X4 12PLY (GAUZE/BANDAGES/DRESSINGS) ×2 IMPLANT
STRIP CLOSURE SKIN 1/2X4 (GAUZE/BANDAGES/DRESSINGS) IMPLANT
STRIP CLOSURE SKIN 1/4X4 (GAUZE/BANDAGES/DRESSINGS) IMPLANT
SUT ETHILON 2 0 FS 18 (SUTURE) ×6 IMPLANT
SUT ETHILON 3 0 PS 1 (SUTURE) IMPLANT
SUT ETHILON 4 0 PS 2 18 (SUTURE) IMPLANT
SUT ETHILON 5 0 P 3 18 (SUTURE)
SUT NYLON ETHILON 5-0 P-3 1X18 (SUTURE) IMPLANT
SUT VIC AB 5-0 PC1 18 (SUTURE) IMPLANT
SUT VICRYL 3-0 CR8 SH (SUTURE) ×1 IMPLANT
SUT VICRYL 4-0 PS2 18IN ABS (SUTURE) IMPLANT
SWABSTICK POVIDONE IODINE SNGL (MISCELLANEOUS) ×4 IMPLANT
SYR CONTROL 10ML LL (SYRINGE) ×2 IMPLANT
TOWEL OR NON WOVEN STRL DISP B (DISPOSABLE) ×1 IMPLANT
UNDERPAD 30X30 INCONTINENT (UNDERPADS AND DIAPERS) ×1 IMPLANT

## 2011-12-21 NOTE — H&P (View-Only) (Signed)
[facility]  Dorlisa Savino, MD,  FACS [address]  [address] [place], [place]    [address] Phone:  [phone] FAX:  [phone]   Re:   Summit P Gauntt DOB:   09/18/1917 MRN:   [id number]  ASSESSMENT AND PLAN: 1.  Recurrent SCCa of right calf, posterior lateral.  The margins are clear on the path report.    The wound looks good.   I removed  the remaining sutures.  Her son in law is with her.  His wife (her daughter) has had hip trouble and is at [facility].  This may effect scheduling surgery on the left side.  2.  Lesion left calf, posterior lateral, possible SCCa.  Will schedule to excise this lesion when it is convenient for her. 3.  History of seizures. In 2000 after fall at [facility].  No further seizures, though she is still on Dilantin. 4.  Hypertension.  5.  Hypercholesterolism.  6.  Thyroid replacement.  7.  Osteoarthritis. 8.  Left hip/leg pain after recent fall.  To see Dr. Giofre (was a former patient of Dr. Carter).  HISTORY OF PRESENT ILLNESS: Chief Complaint  Patient presents with  . Suture / Staple Removal    Izza P Heidelberger is a 78 y.o. (DOB: 12/19/1916)  white female who is a patient of WILSON,FRED HENRY, MD, MD and comes to me today follow up of an excision of a right calf SCCa.  The wound looks good and the path report showed the margins were clear.   She is accompanied with her son in law, Bob [name].  PHYSICAL EXAM: BP 130/76  Pulse 68  Temp(Src) 97.9 F (36.6 C) (Temporal)  Resp 16  Ht 5' 5" (1.651 m)  Wt 170 lb (77.111 kg)  BMI 28.29 kg/m2  General:  Older WF who is slow moving, but alert and able to get on the exam table by herself.  Legs:  Right posterior calf wound looks good.  I removed the remaining sutures.   Left lateral leg:  The lesion is unchanged from what it looked like earlier.  Will plan excision at a time convenient for her.  DATA REVIEWED: None.  Raksha Wolfgang, MD, FACS Office:   [phone]  

## 2011-12-21 NOTE — Discharge Instructions (Signed)
DISCHARGE INSTRUCTIONS TO PATIENT  Activity:  Elevate leg when not walking for at least 3 to 4 days.  Wound Care:   Leave wound covered for 3 days, then may remove bandage and shower wounds.  Diet:  As tolerated  Follow up appointment:  Call Dr. Allene Pyo office [facility]) at [phone] for an appointment in 2 weeks  Medications and dosages:  Resume your home medications.  Call Dr. Ezzard Standing or his office  [phone]) if you have:  Temperature greater than 100.4,  Persistent nausea and vomiting,  Severe uncontrolled pain,  Redness, tenderness, or signs of infection (pain, swelling, redness, odor or green/yellow discharge around the site),  Difficulty breathing, headache or visual disturbances,  Any other questions or concerns you may have after discharge.  In an emergency, call 911 or go to an Emergency Department at a nearby hospital.

## 2011-12-21 NOTE — Brief Op Note (Signed)
12/21/2011  12:13 PM  PATIENT:  Erica [name], 78 y.o., female, MRN: [id number]  PREOP DIAGNOSIS:  lesion left leg  POSTOP DIAGNOSIS:   3 cm lesion, left lower leg - lateral  PROCEDURE:   Procedure(s): MINOR EXCISION OF MASS  SURGEON:   Ovidio Kin, M.D.  ASSISTANT:   none  ANESTHESIA:   local  * No anesthesia staff entered *  Local  EBL:  minimal  ml  BLOOD ADMINISTERED: none  DRAINS: none   LOCAL MEDICATIONS USED:   20cc 1% xylocaine with epi  SPECIMEN:   Left leg lesion - long suture cranial, short suture anterior  COUNTS CORRECT:  YES  INDICATIONS FOR PROCEDURE:  Erica [name] is a 78 y.o. (DOB: [birth year]) white female whose primary care physician is Pamelia Hoit, MD, MD and comes for excision lesion of the left leg.   The indications and risks of the surgery were explained to the patient.  The risks include, but are not limited to, infection, bleeding, and nerve injury.  Note dictated to:   #[id number]

## 2011-12-21 NOTE — Interval H&P Note (Signed)
History and Physical Interval Note:  12/21/2011 11:32 AM  Erica [name]  has presented today for surgery, with the diagnosis of lesion left leg  The various methods of treatment have been discussed with the patient and family. [name] is with her.  After consideration of risks, benefits and other options for treatment, the patient has consented to  Procedure(s) (LRB):  MINOR EXCISION OF MASS (Left) as a surgical intervention .    The patients' history has been reviewed, patient examined, no change in status, stable for surgery.  I have reviewed the patients' chart and labs.  Questions were answered to the patient's satisfaction.     Enas Winchel H

## 2011-12-22 ENCOUNTER — Encounter (HOSPITAL_BASED_OUTPATIENT_CLINIC_OR_DEPARTMENT_OTHER): Payer: Self-pay | Admitting: Surgery

## 2011-12-22 NOTE — Op Note (Signed)
NAME:  KASHLYN, SALINAS               ACCOUNT NO.:  MEDICAL RECORD NO.:  [id number]  LOCATION:                                 FACILITY:  PHYSICIAN:  Sandria Bales. Ezzard Standing, M.D.  DATE OF BIRTH:  Feb 17, 1917  DATE OF PROCEDURE:  12/21/2011                              OPERATIVE REPORT   PREOPERATIVE DIAGNOSIS:  A 3-cm lesion, left lower leg, lateral.  POSTOPERATIVE DIAGNOSIS:  A 3-cm lesion, left lower leg, lateral.  PROCEDURE:  Excision of left lower leg lesion, 3 cm x 10 cm.  SURGEON:  Sandria Bales. Ezzard Standing, MD  FIRST ASSISTANT:  No first assistant.  ANESTHESIA:  Local with 20 mL of 1% Xylocaine with epinephrine.  COMPLICATIONS:  None.  INDICATION OF PROCEDURE:  [name] is an 78 year old white female, who has had multiple squamous cell carcinomas.  I recently resected one on her right leg and she now comes for a suspicious lesion on her left leg for excision.  The indications, potential complications of procedure were explained to the patient.  OPERATIVE NOTE:  The patient in a right lateral decubitus position, her left lower leg, posterior calf was prepped with Betadine solution, sterilely draped.  A time-out was held and surgical checklist run.  The skin was marked and then infiltrated with 20 mL of 1% Xylocaine with epinephrine.  I excised an area approximately 9.5 x 3 cm, found to have excised a lesion in total.  I controlled hemostasis with Bovie electrocautery.  I marked the specimen, long suture cranial and short suture anterior, and was sent to pathology.  I then closed the wound just with simple mattress 2-0 nylon sutures, and though under some tension, the skin did approximate, looked in reasonably good health.  I then painted with Betadine ointment, covered with sterile dressings and a 6-inch Ace bandage.  She will leave the bandage on for a couple days, see me back in 2 weeks for wound check and review of pathology.   Sandria Bales. Ezzard Standing, M.D.,  FACS   DHN/MEDQ  D:  12/21/2011  T:  12/22/2011  Job:  [id number]  cc:   Gloriajean Dell. Andrey Campanile, M.D. Norval Gable. [place], M.D.

## 2011-12-23 ENCOUNTER — Telehealth (INDEPENDENT_AMBULATORY_CARE_PROVIDER_SITE_OTHER): Payer: Self-pay | Admitting: Surgery

## 2011-12-23 NOTE — Telephone Encounter (Signed)
I called the patient and gave her an appointment for 3/28.

## 2011-12-23 NOTE — Telephone Encounter (Signed)
Pt calling in to set up 2 week appt for suture removal. [phone].

## 2012-01-06 ENCOUNTER — Ambulatory Visit (INDEPENDENT_AMBULATORY_CARE_PROVIDER_SITE_OTHER): Payer: Medicare Other | Admitting: Surgery

## 2012-01-06 VITALS — BP 138/70 | HR 78 | Resp 18 | Ht 66.0 in | Wt 165.0 lb

## 2012-01-06 DIAGNOSIS — Z8589 Personal history of malignant neoplasm of other organs and systems: Secondary | ICD-10-CM

## 2012-01-06 NOTE — Progress Notes (Addendum)
[facility]  Ovidio Kin, MD,  FACS [address] [place]  [address] [place], [place]    [address] Phone:  [phone] FAX:  [phone]   Re:   Erica [name] DOB:   1916/10/25 MRN:   [id number]  ASSESSMENT AND PLAN: 1.  Recurrent SCCa of right calf, posterior lateral.  Excised - 11/11/2011  2.  Lesion left calf, posterior lateral, SCCa. 2.3 cm, T1  Excised 12/21/2011.  Posterior margin positive for dysplasia.  I will see her every 2 months for 2 - 4 times to make sure the wound is entirely healed. 3.  History of seizures. In 2000 after fall at [facility].  No further seizures, though she is still on Dilantin. 4.  Hypertension.  5.  Hypercholesterolism.  6.  Thyroid replacement.  7.  Osteoarthritis. 8.  Left hip/leg pain after recent fall.  To see Dr. Lerry Paterson (was a former patient of Dr. Montez Morita). 9.  Daughter died early 01-18-12.  HISTORY OF PRESENT ILLNESS: Chief Complaint  Patient presents with  . Suture / Staple Removal    Erica [name] is a 78 y.o. (DOB: 1917-05-24)  white female who is a patient of Pamelia Hoit, MD, MD and comes to me today follow up of an excision of a left calf SCCa. I removed the sutures and reviewed the pathology with her.  She is accompanied with her son in law, Coy Saunas.  It was his wife that died.  PHYSICAL EXAM: BP 138/70  Pulse 78  Resp 18  Ht 5\' 6"  (1.676 m)  Wt 165 lb (74.844 kg)  BMI 26.63 kg/m2  General:  Older WF who is slow moving, but alert and able to get on the exam table by herself.  Legs:  Right posterior calf: wound looks good.  I took out all the sutures.  Left lateral leg:  Wound okay.  DATA REVIEWED: Path to patient.  Ovidio Kin, MD, FACS Office:  [phone]

## 2012-03-15 ENCOUNTER — Ambulatory Visit (INDEPENDENT_AMBULATORY_CARE_PROVIDER_SITE_OTHER): Payer: Medicare Other | Admitting: Surgery

## 2012-03-15 ENCOUNTER — Encounter (INDEPENDENT_AMBULATORY_CARE_PROVIDER_SITE_OTHER): Payer: Self-pay | Admitting: Surgery

## 2012-03-15 VITALS — BP 136/68 | HR 73 | Temp 97.7°F | Ht 65.5 in | Wt 158.0 lb

## 2012-03-15 DIAGNOSIS — Z8589 Personal history of malignant neoplasm of other organs and systems: Secondary | ICD-10-CM

## 2012-03-15 NOTE — Progress Notes (Signed)
[facility]  Ovidio Kin, MD,  FACS [address] [place]  [address] [place], [place]    [address] Phone:  [phone] FAX:  [phone]   Re:   Erica [name] DOB:   06/18/1917 MRN:   [id number]  ASSESSMENT AND PLAN: 1.  Recurrent SCCa of right calf, posterior lateral.  Excised - 11/11/2011  She has a keratotic lesion just lateral to the old scar.  Clayborne Artist is a Building services engineer of this]    We will watch this are and I will see her back in 3 months.  She knows to call if there is a change.  2.  Lesion left calf, posterior lateral, SCCa. 2.3 cm, T1  Excised 12/21/2011.  Posterior margin positive for dysplasia.  I will see her every 3 months for 2 - 4 times to make sure the wound is entirely healed.  The left leg lesion has healed well 3.  History of seizures.   In 2000 after fall at [facility].  No further seizures, though she is still on Dilantin. 4.  Hypertension.  5.  Hypercholesterolism.  6.  Thyroid replacement.  7.  Osteoarthritis. 8.  Left hip/leg pain after recent fall.  To see Dr. Lerry Paterson (was a former patient of Dr. Montez Morita). 9.  Daughter died early 2012/01/22.  HISTORY OF PRESENT ILLNESS: Chief Complaint  Patient presents with  . Follow-up    reck Lt leg exc lesion    Erica [name] is a 78 y.o. (DOB: [birth year])  white female who is a patient of Pamelia Hoit, MD, MD and comes to me today follow up of an excision of a left calf SCCa. She has had no new complains.  Though she does have a raised area lateral to her right leg incision.  She is accompanied with her son in law, Erica [name].  It was his wife that died.  PHYSICAL EXAM: BP 136/68  Pulse 73  Temp(Src) 97.7 F (36.5 C) (Temporal)  Ht 5' 5.5" (1.664 m)  Wt 158 lb (71.668 kg)  BMI 25.89 kg/m2  SpO2 98%  General:  Older WF who is slow moving, but alert and has some difficulty getting on the exam tablef.  Legs:  Right posterior calf: 1 cm keritotic lesion just lateral to my  scar.  [photo taken]  Left lateral leg:  Wound okay.  DATA REVIEWED: None new.  Ovidio Kin, MD, FACS Office:  [phone]

## 2012-03-28 ENCOUNTER — Encounter (HOSPITAL_COMMUNITY): Payer: Self-pay | Admitting: Emergency Medicine

## 2012-03-28 ENCOUNTER — Emergency Department (HOSPITAL_COMMUNITY)
Admission: EM | Admit: 2012-03-28 | Discharge: 2012-03-28 | Disposition: A | Discharge status: Home / Self Care | Payer: Medicare Other | Source: Home / Self Care | Attending: Emergency Medicine | Admitting: Emergency Medicine

## 2012-03-28 ENCOUNTER — Emergency Department (HOSPITAL_COMMUNITY): Payer: Medicare Other

## 2012-03-28 DIAGNOSIS — I10 Essential (primary) hypertension: Secondary | ICD-10-CM | POA: Insufficient documentation

## 2012-03-28 DIAGNOSIS — X500XXA Overexertion from strenuous movement or load, initial encounter: Secondary | ICD-10-CM | POA: Insufficient documentation

## 2012-03-28 DIAGNOSIS — M25559 Pain in unspecified hip: Secondary | ICD-10-CM | POA: Insufficient documentation

## 2012-03-28 DIAGNOSIS — T84029A Dislocation of unspecified internal joint prosthesis, initial encounter: Secondary | ICD-10-CM | POA: Insufficient documentation

## 2012-03-28 DIAGNOSIS — M25569 Pain in unspecified knee: Secondary | ICD-10-CM | POA: Insufficient documentation

## 2012-03-28 DIAGNOSIS — Z96649 Presence of unspecified artificial hip joint: Secondary | ICD-10-CM | POA: Insufficient documentation

## 2012-03-28 DIAGNOSIS — E876 Hypokalemia: Secondary | ICD-10-CM | POA: Insufficient documentation

## 2012-03-28 DIAGNOSIS — S73004A Unspecified dislocation of right hip, initial encounter: Secondary | ICD-10-CM

## 2012-03-28 DIAGNOSIS — M25579 Pain in unspecified ankle and joints of unspecified foot: Secondary | ICD-10-CM | POA: Insufficient documentation

## 2012-03-28 LAB — DIFFERENTIAL
Basophils Absolute: 0 10*3/uL (ref 0.0–0.1)
Eosinophils Absolute: 0.1 10*3/uL (ref 0.0–0.7)
Lymphocytes Relative: 15 % (ref 12–46)
Lymphs Abs: 0.9 10*3/uL (ref 0.7–4.0)
Neutrophils Relative %: 74 % (ref 43–77)

## 2012-03-28 LAB — BASIC METABOLIC PANEL
CO2: 33 mEq/L — ABNORMAL HIGH (ref 19–32)
GFR calc non Af Amer: 77 mL/min — ABNORMAL LOW (ref >90)
Glucose, Bld: 113 mg/dL — ABNORMAL HIGH (ref 70–99)
Potassium: 3 mEq/L — ABNORMAL LOW (ref 3.5–5.1)
Sodium: 140 mEq/L (ref 135–145)

## 2012-03-28 LAB — CBC
Platelets: 176 10*3/uL (ref 150–400)
RBC: 4.27 MIL/uL (ref 3.87–5.11)
RDW: 13.6 % (ref 11.5–15.5)
WBC: 6.1 10*3/uL (ref 4.0–10.5)

## 2012-03-28 MED ORDER — PROPOFOL 10 MG/ML IV BOLUS
0.5000 mg/kg | Freq: Once | INTRAVENOUS | Status: AC
  Administered 2012-03-28: 70 mg via INTRAVENOUS

## 2012-03-28 MED ORDER — POTASSIUM CHLORIDE CRYS ER 20 MEQ PO TBCR
40.0000 meq | EXTENDED_RELEASE_TABLET | Freq: Once | ORAL | Status: AC
  Administered 2012-03-28: 40 meq via ORAL
  Filled 2012-03-28: qty 2

## 2012-03-28 MED ORDER — FENTANYL CITRATE 0.05 MG/ML IJ SOLN
50.0000 ug | INTRAMUSCULAR | Status: AC
  Administered 2012-03-28: 50 ug via INTRAVENOUS
  Filled 2012-03-28: qty 2

## 2012-03-28 MED ORDER — FENTANYL CITRATE 0.05 MG/ML IJ SOLN
50.0000 ug | Freq: Once | INTRAMUSCULAR | Status: AC
  Administered 2012-03-28: 50 ug via INTRAVENOUS
  Filled 2012-03-28: qty 2

## 2012-03-28 MED ORDER — POTASSIUM CHLORIDE ER 10 MEQ PO TBCR: 10.0000 meq | EXTENDED_RELEASE_TABLET | Freq: Two times a day (BID) | ORAL | Status: DC

## 2012-03-28 NOTE — ED Notes (Signed)
Pt awake conversing with family member

## 2012-03-28 NOTE — ED Notes (Signed)
MD still adjusting leg. Ortho assist.

## 2012-03-28 NOTE — ED Notes (Signed)
MD finished adjusting leg. Ortho applying brace

## 2012-03-28 NOTE — ED Provider Notes (Signed)
Medical screening examination/treatment/procedure(s) were conducted as a shared visit with non-physician practitioner(s) and myself.  I personally evaluated the patient during the encounter  i personally performed the right hip reduction (please see midlevel note of procedure)  Procedural sedation Performed by: Lyanne Co Consent: Verbal consent obtained. Risks and benefits: risks, benefits and alternatives were discussed Required items: required blood products, implants, devices, and special equipment available Patient identity confirmed: arm band and provided demographic data Time out: Immediately prior to procedure a "time out" was called to verify the correct patient, procedure, equipment, support staff and site/side marked as required. Sedation type: moderate (conscious) sedation NPO time confirmed and considedered Sedatives: PROPOFOL Physician Time at Bedside: 20 Vitals: Vital signs were monitored during sedation. Cardiac Monitor, pulse oximeter Patient tolerance: Patient tolerated the procedure well with no immediate complications. Comments: Pt with uneventful recovered. Returned to pre-procedural sedation baseline  Discharge home with orthopedic follow up  1. Hip dislocation, right   2. Hypokalemia    Dg Hip 1 View Right  03/28/2012  *RADIOLOGY REPORT*  Clinical Data: 78 year old female status post fall with hip arthroplasty dislocation.  Post reduction.  RIGHT HIP - 1 VIEW  Comparison: 1452 hours the same day.  Findings: The AP view 1647 hours.  The femoral component now appears normally aligned over the acetabular component.  Hardware appears intact.  No fracture identified.  IMPRESSION: Successfully reduced right hip arthroplasty judging by this single AP view.  Original Report Authenticated By: Harley Hallmark, M.D.   Dg Hip Complete Right  03/28/2012  *RADIOLOGY REPORT*  Clinical Data: Fall, hip deformity.  RIGHT HIP - COMPLETE 2+ VIEW  Comparison: 03/23/2007  Findings:  Right total hip replacement dislocation noted.  The femoral head component projects superior to the acetabular component.  Advanced degenerative changes in the left hip.  Diffuse osteopenia.  IMPRESSION: Right total hip replacement dislocation superiorly.  Original Report Authenticated By: Cyndie Chime, M.D.   Dg Knee 1-2 Views Right  03/28/2012  *RADIOLOGY REPORT*  Clinical Data: 78 year old female with pain after fall.  RIGHT KNEE - 1-2 VIEW  Comparison: None.  Findings: Sequelae of right total knee arthroplasty.  Hardware components appear intact and normally aligned.  Postoperative changes to the patella.  Osteopenia.  No acute fracture or dislocation identified.  No definite joint effusion.  IMPRESSION: Previous right knee arthroplasty.  No acute fracture or dislocation identified.  Original Report Authenticated By: Harley Hallmark, M.D.   Dg Tibia/fibula Right  03/28/2012  *RADIOLOGY REPORT*  Clinical Data: 78 year old female status post fall with pain.  RIGHT TIBIA AND FIBULA - 2 VIEW  Comparison: Right knee series from the same day.  Findings: Right knee total arthroplasty again noted.  Osteopenia. Grossly normal alignment at the right ankle.  Irregularity at the medial malleolus, otherwise the right tibia appears intact.  No definite acute fracture of the right fibula.  Calcified atherosclerosis.  IMPRESSION: 1.  Cannot exclude a minimally-displaced fracture of the right medial malleolus.  Recommend dedicated right ankle series. 2.  No other acute fracture identified in the right tib-fib.  Original Report Authenticated By: Harley Hallmark, M.D.   Dg Ankle Complete Right  03/28/2012  *RADIOLOGY REPORT*  Clinical Data: Generalized ankle pain  RIGHT ANKLE - COMPLETE 3+ VIEW  Comparison: Right tibia-fibula same day  Findings: Three views of the right ankle submitted.  Ankle mortise is preserved.  There is diffuse osteopenia.  Again noted cortical irregularity medial malleolus with a subtle lucent line  suspicious for nondisplaced fracture.  There is soft tissue swelling adjacent to medial malleolus. Plantar and posterior spurring of the calcaneus.  IMPRESSION: There is diffuse osteopenia.  Again noted cortical irregularity medial malleolus with a subtle lucent line suspicious for nondisplaced fracture.  There is soft tissue swelling adjacent to medial malleolus.  Original Report Authenticated By: Natasha Mead, M.D.       Lyanne Co, MD 03/28/12 2016

## 2012-03-28 NOTE — ED Notes (Addendum)
Pt was sliding over bathchair into bathtub when her R hip pop.  Pt lowered herself to bathstool.  Upon EMS arrival R hip was deformed with shortening and rotation.  Pulse, sensation and movemment present.  20ga started, but pt refused pain med.

## 2012-03-28 NOTE — ED Notes (Addendum)
MD adjusting leg.  Ortho assist.

## 2012-03-28 NOTE — ED Notes (Signed)
2 ortho techs, Dr. Patria Mane, Drucie Opitz PA, and Fleet Contras RN at bedside

## 2012-03-28 NOTE — ED Notes (Signed)
[id number]<AV> Expected date:<BR> Expected time: 1:16 PM<BR> Means of arrival:<BR> Comments:<BR> Rock8 - 89yoF hip pain, deformity, fall

## 2012-03-28 NOTE — Discharge Instructions (Signed)
Take potassium supplement as directed but it is important to followup closely with your primary care physician next week for recheck of your potassium. Wear the knee immobilizer for the next 1 to 2 weeks until you followup with [facility] for further evaluation and management of your dislocated hip that was put back into place in emergency department. Use walker for assistance. Return to the emergency department for any emergent changing or worsening symptoms. Alternate between Tylenol and ibuprofen as needed for pain.  Hip Dislocation Hip dislocation is the displacement of the "ball" at the head of your thigh bone (femur) from its socket in the hip bone (pelvis). The ball-and-socket structure of the hip joint gives it a lot of stability, while allowing it to move freely. Therefore, a lot of force is required to displace the femur from its socket. A hip dislocation is an emergency. If you believe you have dislocated your hip and cannot move your leg, call for help immediately. Do not try to move. CAUSES The most common cause of hip dislocation is motor vehicle accidents. However, force from falls from a height (a ladder or building), injuries from contact sports, or injuries from industrial accidents can be enough to dislocate your hip. SYMPTOMS A hip dislocation is very painful. If you have a dislocated hip, you will not be able to move your hip. If you have nerve damage, you may not have feeling in your lower leg, foot, or ankle.  DIAGNOSIS Usually, your caregiver can diagnose a hip dislocation by looking at the position of your leg. Generally, X-ray exams are done to check for fractures in your femur or pelvis. The leg of the dislocated hip will appear shorter than the other leg, and your foot will be turned inward. TREATMENT  Your caregiver can manipulate your bones back into the joint (reduction). If there are no other complications involved with your dislocation, such as fractures or  damage to blood vessels or nerves, this procedure can be done without surgery. Before this procedure, you will be given medicine so that you will not feel pain (anesthetic). Often specialized imaging exams are done after the reduction (magnetic resonance imaging [MRI] or computed tomography [CT]) to check for loose pieces of cartilage or bone in the joint. If a manual reduction fails or you have nerve damage, damage to your blood vessels, or bone fractures, surgery will be necessary to perform the reduction.  HOME CARE INSTRUCTIONS The following measures can help to reduce pain and speed up the healing process:  Rest your injured joint. Do not move your joint if it is painful. Also, avoid activities similar to the one that caused your injury.   Apply ice to your injured joint for 1 to 2 days after your reduction or as directed by your caregiver. Applying ice helps to reduce inflammation and pain.   Put ice in a plastic bag.   Place a towel between your skin and the bag.   Leave the ice on for 15 to 20 minutes at a time, every 2 hours while you are awake.   Use crutches or a walker as directed by your caregiver.   Exercise your hip and leg as directed by your caregiver.   Take over-the-counter or prescription medicine for pain as directed by your caregiver.  SEEK IMMEDIATE MEDICAL CARE IF:  Your pain becomes worse rather than better.   You feel like your hip has become dislocated again.  MAKE SURE YOU:  Understand these instructions.  Will watch your condition.   Will get help right away if you are not doing well or get worse.  Document Released: 06/22/2001 Document Revised: 09/16/2011 Document Reviewed: 02/25/2011 [facility] Patient Information 2012 [place], [place].

## 2012-03-28 NOTE — ED Notes (Signed)
Patient has urinal positioned between legs to obtain urine specimen if patient voids.  

## 2012-03-28 NOTE — ED Notes (Signed)
Propofol 30mg  given.  Airway adjusted o2 sat 100

## 2012-03-28 NOTE — ED Provider Notes (Signed)
History     CSN: [id number]  Arrival date & time 03/28/12  1333   First MD Initiated Contact with Patient 03/28/12 1350      Chief Complaint  Patient presents with  . Hip Pain    deformity    (Consider location/radiation/quality/duration/timing/severity/associated sxs/prior treatment) Patient is a 78 y.o. female presenting with hip pain. The history is provided by the patient, medical records and a relative.  Hip Pain   Patient who states she has hx of hip dislcoation adn prosthestic hip presents to ER complmaining of right hip pain and injury just PTA by EMS. Patient states she was sitting on her bath tub chair and "swung leg over to get out of tub" and when she did she states her "hip popped right out and now I can not move it." patient states this similar to prior dislocation. Patient has seen [facility] in the past. She denies extremity numbness/tingling/weakness or additional injury. She denies falling off of the chair or hitting her head. Pain is constant and aggravated by movement or touch.   Past Medical History  Diagnosis Date  . Hypertension   . Hemorrhoids   . Thyroid disease   . Osteoarthritis   . Seizures   . Depression   . Hyperlipidemia   . Wears glasses   . Cancer     skin  . Bruises easily   . Skin cancer     Past Surgical History  Procedure Date  . Cataract extraction, bilateral   . Carpal tunnel release     left hand  . Shoulder arthroscopy 1998    right  . Total hip arthroplasty     right  . Total knee arthroplasty     right   . Rotator cuff repair     left shoulder  . Total knee arthroplasty     left   . Cholecystectomy   . Breast cyst excision     left  . Abdominal hysterectomy   . Tonsellectomy   . Skin lesion excision 05/25/2011  . Lesion removal 11/11/2011    Procedure: MINOR EXICISION OF LESION;  Surgeon: Kandis Cocking, MD;  Location: [facility];  Service: General;  Laterality: Right;  right leg  . Mass  excision 12/21/2011    Procedure: MINOR EXCISION OF MASS;  Surgeon: Kandis Cocking, MD;  Location: [facility];  Service: General;  Laterality: Left;  excision of lesion left leg-3cm    Family History  Problem Relation Age of Onset  . Heart disease Father   . Heart disease Sister   . Cancer Brother     mouth and throat    History  Substance Use Topics  . Smoking status: Never Smoker   . Smokeless tobacco: Never Used  . Alcohol Use: No    OB History    Grav Para Term Preterm Abortions TAB SAB Ect Mult Living                  Review of Systems  All other systems reviewed and are negative.    Allergies  Clarithromycin; Detrol; Ditropan; Prednisone; and Vioxx  Home Medications   Current Outpatient Rx  Name Route Sig Dispense Refill  . AMLODIPINE BESYLATE 2.5 MG PO TABS  2.5 mg See admin instructions. Take one tablet daily    . B COMPLEX-C PO TABS Oral Take 1 tablet by mouth daily. OTC  [facility] brand    . CALCIUM CARBONATE-VITAMIN D 250-125 MG-UNIT PO  TABS Oral Take 1 tablet by mouth daily.     Marland Kitchen VITAMIN D3 2000 UNITS PO CAPS Oral Take 2,000 Units by mouth daily.    Marland Kitchen FEXOFENADINE HCL 180 MG PO TABS Oral Take 180 mg by mouth daily.    Marland Kitchen LEVOTHYROXINE SODIUM 125 MCG PO TABS Oral Take 125 mcg by mouth daily.    . MULTI-VITAMIN/MINERALS PO TABS Oral Take 1 tablet by mouth daily.     . ALEVE PO Oral Take by mouth as needed.      Marland Kitchen PAROXETINE HCL 20 MG PO TABS Oral Take 20 mg by mouth daily.     Marland Kitchen PHENYTOIN SODIUM EXTENDED 200 MG PO CAPS Oral Take 100 mg by mouth See admin instructions. Take two capsules by mouth at bedtime    . SIMVASTATIN 40 MG PO TABS Oral Take 40 mg by mouth daily.     Marland Kitchen SPIRONOLACTONE-HCTZ 25-25 MG PO TABS Oral Take 1 tablet by mouth daily.       BP 149/101  Pulse 67  Temp 97.7 F (36.5 C) (Oral)  Resp 14  SpO2 98%  Physical Exam  Constitutional: She is oriented to person, place, and time. She appears well-developed and well-nourished.  No distress.  HENT:  Head: Normocephalic and atraumatic.  Eyes: Conjunctivae are normal.  Cardiovascular: Normal rate and regular rhythm.   Pulmonary/Chest: Effort normal.  Musculoskeletal: She exhibits tenderness.       Right ankle: She exhibits swelling. tenderness.       RLE internally rotated and shortened. Good pedal pulse and normal sensation of RLE. Mild TTP over right lateral hip. Pelvis stable. Good femoral pulse.   Neurological: She is alert and oriented to person, place, and time.       Normal sensation of entire foot.   Skin: Skin is warm and dry. No rash noted. She is not diaphoretic. No erythema. No pallor.  Psychiatric: She has a normal mood and affect. Her behavior is normal.    ED Course  Reduction of dislocation Date/Time: 03/28/2012 4:10 PM Performed by: Drucie Opitz Authorized by: Drucie Opitz Consent: Verbal consent obtained. Written consent obtained. Risks and benefits: risks, benefits and alternatives were discussed Consent given by: patient Patient understanding: patient states understanding of the procedure being performed Patient consent: the patient's understanding of the procedure matches consent given Relevant documents: relevant documents present and verified Imaging studies: imaging studies available Patient identity confirmed: verbally with patient and arm band Time out: Immediately prior to procedure a "time out" was called to verify the correct patient, procedure, equipment, support staff and site/side marked as required. Patient sedated: yes Sedatives: propofol Vitals: Vital signs were monitored during sedation. Patient tolerance: Patient tolerated the procedure well with no immediate complications. Comments: Right hip dislocation successfully reduced under conscious sedation.    (including critical care time)  IV fentanyl  3:26 PM Imaging reviewed with Dr. Patria Mane. We will consent patient for conscious sedation and reduction or right  dislocated hip. RLE is neurovasc intact pre reduction.    Date: 03/28/2012  Rate: 68  Rhythm: normal sinus rhythm  QRS Axis: normal  Intervals: normal  ST/T Wave abnormalities: normal  Conduction Disutrbances:left anterior fascicular block  Narrative Interpretation: non provocative EKG compared to Jul 23, 2005  Old EKG Reviewed: unchanged   Labs Reviewed  BASIC METABOLIC PANEL - Abnormal; Notable for the following:    Potassium 3.0 (*)     CO2 33 (*)     Glucose, Bld 113 (*)  GFR calc non Af Amer 77 (*)     GFR calc Af Amer 89 (*)     All other components within normal limits  CBC  DIFFERENTIAL   Dg Hip 1 View Right  03/28/2012  *RADIOLOGY REPORT*  Clinical Data: 78 year old female status post fall with hip arthroplasty dislocation.  Post reduction.  RIGHT HIP - 1 VIEW  Comparison: 1452 hours the same day.  Findings: The AP view 1647 hours.  The femoral component now appears normally aligned over the acetabular component.  Hardware appears intact.  No fracture identified.  IMPRESSION: Successfully reduced right hip arthroplasty judging by this single AP view.  Original Report Authenticated By: Harley Hallmark, M.D.   Dg Hip Complete Right  03/28/2012  *RADIOLOGY REPORT*  Clinical Data: Fall, hip deformity.  RIGHT HIP - COMPLETE 2+ VIEW  Comparison: 03/23/2007  Findings: Right total hip replacement dislocation noted.  The femoral head component projects superior to the acetabular component.  Advanced degenerative changes in the left hip.  Diffuse osteopenia.  IMPRESSION: Right total hip replacement dislocation superiorly.  Original Report Authenticated By: Cyndie Chime, M.D.   Dg Knee 1-2 Views Right  03/28/2012  *RADIOLOGY REPORT*  Clinical Data: 78 year old female with pain after fall.  RIGHT KNEE - 1-2 VIEW  Comparison: None.  Findings: Sequelae of right total knee arthroplasty.  Hardware components appear intact and normally aligned.  Postoperative changes to the patella.   Osteopenia.  No acute fracture or dislocation identified.  No definite joint effusion.  IMPRESSION: Previous right knee arthroplasty.  No acute fracture or dislocation identified.  Original Report Authenticated By: Harley Hallmark, M.D.   Dg Tibia/fibula Right  03/28/2012  *RADIOLOGY REPORT*  Clinical Data: 78 year old female status post fall with pain.  RIGHT TIBIA AND FIBULA - 2 VIEW  Comparison: Right knee series from the same day.  Findings: Right knee total arthroplasty again noted.  Osteopenia. Grossly normal alignment at the right ankle.  Irregularity at the medial malleolus, otherwise the right tibia appears intact.  No definite acute fracture of the right fibula.  Calcified atherosclerosis.  IMPRESSION: 1.  Cannot exclude a minimally-displaced fracture of the right medial malleolus.  Recommend dedicated right ankle series. 2.  No other acute fracture identified in the right tib-fib.  Original Report Authenticated By: Harley Hallmark, M.D.   Dg Ankle Complete Right  03/28/2012  *RADIOLOGY REPORT*  Clinical Data: Generalized ankle pain  RIGHT ANKLE - COMPLETE 3+ VIEW  Comparison: Right tibia-fibula same day  Findings: Three views of the right ankle submitted.  Ankle mortise is preserved.  There is diffuse osteopenia.  Again noted cortical irregularity medial malleolus with a subtle lucent line suspicious for nondisplaced fracture.  There is soft tissue swelling adjacent to medial malleolus. Plantar and posterior spurring of the calcaneus.  IMPRESSION: There is diffuse osteopenia.  Again noted cortical irregularity medial malleolus with a subtle lucent line suspicious for nondisplaced fracture.  There is soft tissue swelling adjacent to medial malleolus.  Original Report Authenticated By: Natasha Mead, M.D.   RLE neurovasc intact after reduction. Force was needed to reduce hip under conscious sedation and therefore post reduction imaging obtained to rule acute abnormality. Despite ankle finding, patient has  no complaints of ankle pain with FROM and there was no direct force applied to ankle.   1. Hip dislocation, right   2. Hypokalemia       MDM  Right lower extremity is neurovascularly intact pre-and post reduction. No other  acute findings on x-rays. Patient states her pain is completely resolved post reduction. Patient is agreeable to following up closely with her orthopedic surgeon in [place] [facility] in the near future. Incidental hypokalemia found on labs and repleted in the ER. She is agreeable to following up closely with her primary care provider for recheck of potassium. Patient has family at bedside who will be caring for patient closely. She states she has a walker at home for assistance.        [place], [place] 03/28/12 1811

## 2012-03-30 ENCOUNTER — Inpatient Hospital Stay (HOSPITAL_COMMUNITY): Payer: Medicare Other

## 2012-03-30 ENCOUNTER — Emergency Department (HOSPITAL_COMMUNITY): Payer: Medicare Other

## 2012-03-30 ENCOUNTER — Encounter (HOSPITAL_COMMUNITY): Payer: Self-pay

## 2012-03-30 ENCOUNTER — Inpatient Hospital Stay (HOSPITAL_COMMUNITY)
Admission: EM | Admit: 2012-03-30 | Discharge: 2012-04-03 | DRG: 690 | Disposition: A | Discharge status: Skilled Nursing Facility | Payer: Medicare Other | Attending: Internal Medicine | Admitting: Internal Medicine

## 2012-03-30 DIAGNOSIS — E876 Hypokalemia: Secondary | ICD-10-CM | POA: Diagnosis present

## 2012-03-30 DIAGNOSIS — Z85828 Personal history of other malignant neoplasm of skin: Secondary | ICD-10-CM

## 2012-03-30 DIAGNOSIS — E860 Dehydration: Secondary | ICD-10-CM

## 2012-03-30 DIAGNOSIS — R531 Weakness: Secondary | ICD-10-CM | POA: Diagnosis present

## 2012-03-30 DIAGNOSIS — N390 Urinary tract infection, site not specified: Secondary | ICD-10-CM | POA: Diagnosis present

## 2012-03-30 DIAGNOSIS — Z9181 History of falling: Secondary | ICD-10-CM

## 2012-03-30 DIAGNOSIS — L989 Disorder of the skin and subcutaneous tissue, unspecified: Secondary | ICD-10-CM

## 2012-03-30 DIAGNOSIS — Z66 Do not resuscitate: Secondary | ICD-10-CM | POA: Diagnosis present

## 2012-03-30 DIAGNOSIS — M1990 Unspecified osteoarthritis, unspecified site: Secondary | ICD-10-CM | POA: Diagnosis present

## 2012-03-30 DIAGNOSIS — W19XXXA Unspecified fall, initial encounter: Secondary | ICD-10-CM | POA: Diagnosis present

## 2012-03-30 DIAGNOSIS — M6282 Rhabdomyolysis: Secondary | ICD-10-CM | POA: Diagnosis present

## 2012-03-30 DIAGNOSIS — R5383 Other fatigue: Secondary | ICD-10-CM

## 2012-03-30 DIAGNOSIS — B9689 Other specified bacterial agents as the cause of diseases classified elsewhere: Secondary | ICD-10-CM | POA: Diagnosis present

## 2012-03-30 DIAGNOSIS — R5381 Other malaise: Secondary | ICD-10-CM

## 2012-03-30 DIAGNOSIS — Z79899 Other long term (current) drug therapy: Secondary | ICD-10-CM

## 2012-03-30 DIAGNOSIS — E785 Hyperlipidemia, unspecified: Secondary | ICD-10-CM | POA: Diagnosis present

## 2012-03-30 DIAGNOSIS — I10 Essential (primary) hypertension: Secondary | ICD-10-CM | POA: Diagnosis present

## 2012-03-30 DIAGNOSIS — G40909 Epilepsy, unspecified, not intractable, without status epilepticus: Secondary | ICD-10-CM | POA: Diagnosis present

## 2012-03-30 DIAGNOSIS — E119 Type 2 diabetes mellitus without complications: Secondary | ICD-10-CM

## 2012-03-30 DIAGNOSIS — F3289 Other specified depressive episodes: Secondary | ICD-10-CM | POA: Diagnosis present

## 2012-03-30 DIAGNOSIS — F329 Major depressive disorder, single episode, unspecified: Secondary | ICD-10-CM | POA: Diagnosis present

## 2012-03-30 DIAGNOSIS — Z8589 Personal history of malignant neoplasm of other organs and systems: Secondary | ICD-10-CM

## 2012-03-30 DIAGNOSIS — Z96649 Presence of unspecified artificial hip joint: Secondary | ICD-10-CM

## 2012-03-30 DIAGNOSIS — E039 Hypothyroidism, unspecified: Secondary | ICD-10-CM | POA: Diagnosis present

## 2012-03-30 DIAGNOSIS — Z96659 Presence of unspecified artificial knee joint: Secondary | ICD-10-CM

## 2012-03-30 DIAGNOSIS — E782 Mixed hyperlipidemia: Secondary | ICD-10-CM

## 2012-03-30 LAB — URINALYSIS, ROUTINE W REFLEX MICROSCOPIC
Bilirubin Urine: NEGATIVE
Ketones, ur: 15 mg/dL — AB
Nitrite: POSITIVE — AB
pH: 6.5 (ref 5.0–8.0)

## 2012-03-30 LAB — CBC
Hemoglobin: 12.8 g/dL (ref 12.0–15.0)
MCH: 31.5 pg (ref 26.0–34.0)
MCV: 93.8 fL (ref 78.0–100.0)
RBC: 4.06 MIL/uL (ref 3.87–5.11)

## 2012-03-30 LAB — URINE MICROSCOPIC-ADD ON

## 2012-03-30 LAB — COMPREHENSIVE METABOLIC PANEL
Alkaline Phosphatase: 81 U/L (ref 39–117)
BUN: 17 mg/dL (ref 6–23)
Calcium: 9 mg/dL (ref 8.4–10.5)
GFR calc Af Amer: >90 mL/min (ref >90)
Glucose, Bld: 100 mg/dL — ABNORMAL HIGH (ref 70–99)
Total Protein: 6.2 g/dL (ref 6.0–8.3)

## 2012-03-30 LAB — DIFFERENTIAL
Eosinophils Absolute: 0.1 10*3/uL (ref 0.0–0.7)
Eosinophils Relative: 1 % (ref 0–5)
Lymphs Abs: 1.4 10*3/uL (ref 0.7–4.0)
Monocytes Relative: 8 % (ref 3–12)

## 2012-03-30 LAB — CARDIAC PANEL(CRET KIN+CKTOT+MB+TROPI)
Relative Index: 1.7 (ref 0.0–2.5)
Total CK: 1043 U/L — ABNORMAL HIGH (ref 7–177)
Troponin I: <0.3 ng/mL (ref <0.30)

## 2012-03-30 MED ORDER — POTASSIUM CHLORIDE 10 MEQ/100ML IV SOLN
10.0000 meq | Freq: Once | INTRAVENOUS | Status: AC
  Administered 2012-03-30: 10 meq via INTRAVENOUS
  Filled 2012-03-30: qty 100

## 2012-03-30 MED ORDER — ONDANSETRON HCL 4 MG/2ML IJ SOLN: 4.0000 mg | Freq: Three times a day (TID) | INTRAMUSCULAR | Status: DC | PRN

## 2012-03-30 MED ORDER — LEVOTHYROXINE SODIUM 25 MCG PO TABS
125.0000 ug | ORAL_TABLET | Freq: Every day | ORAL | Status: DC
  Administered 2012-03-31 – 2012-04-01 (×2): 125 ug via ORAL
  Filled 2012-03-30 (×2): qty 1

## 2012-03-30 MED ORDER — ONDANSETRON HCL 4 MG/2ML IJ SOLN: 4.0000 mg | Freq: Four times a day (QID) | INTRAMUSCULAR | Status: DC | PRN

## 2012-03-30 MED ORDER — ACETAMINOPHEN 650 MG RE SUPP: 650.0000 mg | Freq: Four times a day (QID) | RECTAL | Status: DC | PRN

## 2012-03-30 MED ORDER — SODIUM CHLORIDE 0.9 % IV SOLN: INTRAVENOUS | Status: DC

## 2012-03-30 MED ORDER — HYDROCODONE-ACETAMINOPHEN 5-325 MG PO TABS: 1.0000 | ORAL_TABLET | ORAL | Status: DC | PRN

## 2012-03-30 MED ORDER — POTASSIUM CHLORIDE IN NACL 40-0.9 MEQ/L-% IV SOLN
INTRAVENOUS | Status: AC
  Filled 2012-03-30: qty 1000

## 2012-03-30 MED ORDER — ASPIRIN EC 81 MG PO TBEC
81.0000 mg | DELAYED_RELEASE_TABLET | Freq: Every day | ORAL | Status: DC
  Administered 2012-03-31 – 2012-04-03 (×4): 81 mg via ORAL
  Filled 2012-03-30 (×4): qty 1

## 2012-03-30 MED ORDER — SODIUM CHLORIDE 0.9 % IV SOLN
Freq: Once | INTRAVENOUS | Status: AC
  Administered 2012-03-30: 15:00:00 via INTRAVENOUS

## 2012-03-30 MED ORDER — MORPHINE SULFATE 2 MG/ML IJ SOLN: 1.0000 mg | INTRAMUSCULAR | Status: DC | PRN

## 2012-03-30 MED ORDER — PHENYTOIN SODIUM EXTENDED 100 MG PO CAPS
200.0000 mg | ORAL_CAPSULE | Freq: Every day | ORAL | Status: DC
  Administered 2012-03-30 – 2012-04-02 (×4): 200 mg via ORAL
  Filled 2012-03-30 (×4): qty 2

## 2012-03-30 MED ORDER — DEXTROSE 5 % IV SOLN
1.0000 g | Freq: Once | INTRAVENOUS | Status: AC
  Administered 2012-03-30: 1 g via INTRAVENOUS
  Filled 2012-03-30: qty 10

## 2012-03-30 MED ORDER — VITAMIN D3 25 MCG (1000 UNIT) PO TABS
2000.0000 [IU] | ORAL_TABLET | Freq: Every day | ORAL | Status: DC
  Administered 2012-03-31 – 2012-04-03 (×4): 2000 [IU] via ORAL
  Filled 2012-03-30 (×9): qty 2

## 2012-03-30 MED ORDER — PAROXETINE HCL 20 MG PO TABS
20.0000 mg | ORAL_TABLET | Freq: Every day | ORAL | Status: DC
  Administered 2012-03-31 – 2012-04-03 (×4): 20 mg via ORAL
  Filled 2012-03-30 (×4): qty 1

## 2012-03-30 MED ORDER — POTASSIUM CHLORIDE IN NACL 40-0.9 MEQ/L-% IV SOLN
INTRAVENOUS | Status: DC
  Administered 2012-03-30: 22:00:00 via INTRAVENOUS
  Filled 2012-03-30 (×7): qty 1000

## 2012-03-30 MED ORDER — ACETAMINOPHEN 325 MG PO TABS: 650.0000 mg | ORAL_TABLET | Freq: Four times a day (QID) | ORAL | Status: DC | PRN

## 2012-03-30 MED ORDER — DEXTROSE 5 % IV SOLN
1.0000 g | INTRAVENOUS | Status: DC
  Administered 2012-03-31 – 2012-04-01 (×2): 1 g via INTRAVENOUS
  Filled 2012-03-30 (×3): qty 10

## 2012-03-30 MED ORDER — ONDANSETRON HCL 4 MG PO TABS: 4.0000 mg | ORAL_TABLET | Freq: Four times a day (QID) | ORAL | Status: DC | PRN

## 2012-03-30 MED ORDER — ENOXAPARIN SODIUM 40 MG/0.4ML ~~LOC~~ SOLN
40.0000 mg | SUBCUTANEOUS | Status: DC
  Administered 2012-03-30 – 2012-04-02 (×4): 40 mg via SUBCUTANEOUS
  Filled 2012-03-30 (×4): qty 0.4

## 2012-03-30 MED ORDER — POTASSIUM CHLORIDE CRYS ER 20 MEQ PO TBCR
40.0000 meq | EXTENDED_RELEASE_TABLET | Freq: Once | ORAL | Status: AC
  Administered 2012-03-30: 40 meq via ORAL
  Filled 2012-03-30: qty 2

## 2012-03-30 NOTE — ED Notes (Signed)
Pt via EMS from home with complaints of weakness and incontence that started 2 days prior. Cast to right leg for knee reduction on Mon at [facility]

## 2012-03-30 NOTE — ED Notes (Signed)
CRITICAL VALUE ALERT  Critical value received:  CKMB 17.3  Date of notification: 03/30/12  Time of notification:  1606  Critical value read back:yes  Nurse who received alert:  t abbott rn  MD notified (1st page):    Time of first page:  1606  MD notified (2nd page):  Time of second page:  Responding MD:  rancour  Time MD responded:

## 2012-03-30 NOTE — ED Provider Notes (Signed)
History   This chart was scribed for Glynn Octave, MD by Clarita Crane. The patient was seen in room APA05/APA05. Patient's care was started at 1342.    CSN: [id number]  Arrival date & time 03/30/12  1342   First MD Initiated Contact with Patient 03/30/12 1354      Chief Complaint  Patient presents with  . incontience   . Fatigue    (Consider location/radiation/quality/duration/timing/severity/associated sxs/prior treatment) HPI Erica [name] is a 78 y.o. female who presents to the Emergency Department complaining of constant moderate to severe generalized weakness with associated urinary incontinence, decreased appetite and decreased fluid intake onset 2 days ago after being d/c from [facility] after evaluation for right hip dislocation and gradually worsening since. Reports she has been bedridden for the past 2 days since discharge due to weakness. Denies abdominal pain, chest pain, fever, chills, back pain. Patient with h/o HTN, thyroid disease, hemorrhoids, seizures, HLD, skin CA, total right hip arthroplasty, total left knee arthroplasty, abdominal hysterectomy.    Past Medical History  Diagnosis Date  . Hypertension   . Hemorrhoids   . Thyroid disease   . Osteoarthritis   . Seizures   . Depression   . Hyperlipidemia   . Wears glasses   . Cancer     skin  . Bruises easily   . Skin cancer     Past Surgical History  Procedure Date  . Cataract extraction, bilateral   . Carpal tunnel release     left hand  . Shoulder arthroscopy 1998    right  . Total hip arthroplasty     right  . Total knee arthroplasty     right   . Rotator cuff repair     left shoulder  . Total knee arthroplasty     left   . Cholecystectomy   . Breast cyst excision     left  . Abdominal hysterectomy   . Tonsellectomy   . Skin lesion excision 05/25/2011  . Lesion removal 11/11/2011    Procedure: MINOR EXICISION OF LESION;  Surgeon: Kandis Cocking, MD;  Location: [facility];  Service: General;  Laterality: Right;  right leg  . Mass excision 12/21/2011    Procedure: MINOR EXCISION OF MASS;  Surgeon: Kandis Cocking, MD;  Location: [facility];  Service: General;  Laterality: Left;  excision of lesion left leg-3cm    Family History  Problem Relation Age of Onset  . Heart disease Father   . Heart disease Sister   . Cancer Brother     mouth and throat    History  Substance Use Topics  . Smoking status: Never Smoker   . Smokeless tobacco: Never Used  . Alcohol Use: No    OB History    Grav Para Term Preterm Abortions TAB SAB Ect Mult Living                  Review of Systems A complete 10 system review of systems was obtained and all systems are negative except as noted in the HPI and PMH.   Allergies  Clarithromycin; Detrol; Ditropan; Prednisone; and Vioxx  Home Medications   Current Outpatient Rx  Name Route Sig Dispense Refill  . AMLODIPINE BESYLATE 2.5 MG PO TABS  2.5 mg See admin instructions. Take one tablet daily    . B COMPLEX-C PO TABS Oral Take 1 tablet by mouth daily. OTC  [facility] brand    . CALCIUM  CARBONATE-VITAMIN D 250-125 MG-UNIT PO TABS Oral Take 1 tablet by mouth daily.     Marland Kitchen VITAMIN D3 2000 UNITS PO CAPS Oral Take 2,000 Units by mouth daily.    Marland Kitchen FEXOFENADINE HCL 180 MG PO TABS Oral Take 180 mg by mouth daily.    Marland Kitchen LEVOTHYROXINE SODIUM 125 MCG PO TABS Oral Take 125 mcg by mouth daily.    . MULTI-VITAMIN/MINERALS PO TABS Oral Take 1 tablet by mouth daily.     . ALEVE PO Oral Take by mouth as needed.      Marland Kitchen PAROXETINE HCL 20 MG PO TABS Oral Take 20 mg by mouth daily.     Marland Kitchen PHENYTOIN SODIUM EXTENDED 200 MG PO CAPS Oral Take 100 mg by mouth See admin instructions. Take two capsules by mouth at bedtime    . POTASSIUM CHLORIDE ER 10 MEQ PO TBCR Oral Take 1 tablet (10 mEq total) by mouth 2 (two) times daily. 30 tablet 0  . SIMVASTATIN 40 MG PO TABS Oral Take 40 mg by mouth daily.     Marland Kitchen  SPIRONOLACTONE-HCTZ 25-25 MG PO TABS Oral Take 1 tablet by mouth daily.       BP 132/49  Pulse 79  Temp 98.4 F (36.9 C) (Oral)  Resp 16  SpO2 97%  Physical Exam  Nursing note and vitals reviewed. Constitutional: She is oriented to person, place, and time. She appears well-developed and well-nourished. No distress.       Smells of urine.   HENT:  Head: Normocephalic and atraumatic.  Eyes: EOM are normal. Pupils are equal, round, and reactive to light.  Neck: Neck supple. No tracheal deviation present.  Cardiovascular: Normal rate and regular rhythm.  Exam reveals no gallop and no friction rub.   No murmur heard. Pulmonary/Chest: Effort normal. No respiratory distress. She has no wheezes. She has no rales.  Abdominal: Soft. She exhibits no distension. There is no tenderness.  Musculoskeletal: Normal range of motion. She exhibits no edema.       +2 DP and PT pulses. Bilateral ankle dorsiflexion and plantarflexion intact. Small ecchymosis to medial side of left patella.  Right knee immobilizer in place. FROM of left knee with no effusion noted. Spine non-tender.  Neurological: She is alert and oriented to person, place, and time. No sensory deficit.  Skin: Skin is warm and dry.  Psychiatric: She has a normal mood and affect. Her behavior is normal.    ED Course  Procedures (including critical care time)  DIAGNOSTIC STUDIES: Oxygen Saturation is 97% on room air, normal by my interpretation.    COORDINATION OF CARE: 2:28PM- Patient informed of current plan for treatment and evaluation and agrees with plan at this time.      Labs Reviewed  CBC  DIFFERENTIAL  COMPREHENSIVE METABOLIC PANEL  CARDIAC PANEL(CRET KIN+CKTOT+MB+TROPI)  URINALYSIS, ROUTINE W REFLEX MICROSCOPIC  URINE CULTURE  PHENYTOIN LEVEL, TOTAL   Dg Hip 1 View Right  03/28/2012  *RADIOLOGY REPORT*  Clinical Data: 78 year old female status post fall with hip arthroplasty dislocation.  Post reduction.  RIGHT HIP  - 1 VIEW  Comparison: 1452 hours the same day.  Findings: The AP view 1647 hours.  The femoral component now appears normally aligned over the acetabular component.  Hardware appears intact.  No fracture identified.  IMPRESSION: Successfully reduced right hip arthroplasty judging by this single AP view.  Original Report Authenticated By: Harley Hallmark, M.D.   Dg Hip Complete Right  03/28/2012  *RADIOLOGY REPORT*  Clinical  Data: Fall, hip deformity.  RIGHT HIP - COMPLETE 2+ VIEW  Comparison: 03/23/2007  Findings: Right total hip replacement dislocation noted.  The femoral head component projects superior to the acetabular component.  Advanced degenerative changes in the left hip.  Diffuse osteopenia.  IMPRESSION: Right total hip replacement dislocation superiorly.  Original Report Authenticated By: Cyndie Chime, M.D.   Dg Knee 1-2 Views Right  03/28/2012  *RADIOLOGY REPORT*  Clinical Data: 78 year old female with pain after fall.  RIGHT KNEE - 1-2 VIEW  Comparison: None.  Findings: Sequelae of right total knee arthroplasty.  Hardware components appear intact and normally aligned.  Postoperative changes to the patella.  Osteopenia.  No acute fracture or dislocation identified.  No definite joint effusion.  IMPRESSION: Previous right knee arthroplasty.  No acute fracture or dislocation identified.  Original Report Authenticated By: Harley Hallmark, M.D.   Dg Tibia/fibula Right  03/28/2012  *RADIOLOGY REPORT*  Clinical Data: 78 year old female status post fall with pain.  RIGHT TIBIA AND FIBULA - 2 VIEW  Comparison: Right knee series from the same day.  Findings: Right knee total arthroplasty again noted.  Osteopenia. Grossly normal alignment at the right ankle.  Irregularity at the medial malleolus, otherwise the right tibia appears intact.  No definite acute fracture of the right fibula.  Calcified atherosclerosis.  IMPRESSION: 1.  Cannot exclude a minimally-displaced fracture of the right medial malleolus.   Recommend dedicated right ankle series. 2.  No other acute fracture identified in the right tib-fib.  Original Report Authenticated By: Harley Hallmark, M.D.   Dg Ankle Complete Right  03/28/2012  *RADIOLOGY REPORT*  Clinical Data: Generalized ankle pain  RIGHT ANKLE - COMPLETE 3+ VIEW  Comparison: Right tibia-fibula same day  Findings: Three views of the right ankle submitted.  Ankle mortise is preserved.  There is diffuse osteopenia.  Again noted cortical irregularity medial malleolus with a subtle lucent line suspicious for nondisplaced fracture.  There is soft tissue swelling adjacent to medial malleolus. Plantar and posterior spurring of the calcaneus.  IMPRESSION: There is diffuse osteopenia.  Again noted cortical irregularity medial malleolus with a subtle lucent line suspicious for nondisplaced fracture.  There is soft tissue swelling adjacent to medial malleolus.  Original Report Authenticated By: Natasha Mead, M.D.     No diagnosis found.    MDM  2 days of generalized weakness, urinary incontinence, decreased by mouth intake. Seen 2 days ago for right hip dislocation reduction.   +UA, given rocephin. Generalized weakness, unable to walk. Elevated CK with normal troponin and EKG.  Suspect MSK injury from falls and recent hip manipulation. Hypokalemia.    Date: 03/30/2012  Rate: 79  Rhythm: normal sinus rhythm  QRS Axis: left  Intervals: normal  ST/T Wave abnormalities: nonspecific ST/T changes  Conduction Disutrbances:none  Narrative Interpretation:   Old EKG Reviewed: unchanged     I personally performed the services described in this documentation, which was scribed in my presence.  The recorded information has been reviewed and considered.    Glynn Octave, MD 03/30/12 1904

## 2012-03-30 NOTE — ED Notes (Signed)
Pt staes that she is unable to ambulate at this time. Suhail Peloquin

## 2012-03-30 NOTE — H&P (Signed)
PCP:   Pamelia Hoit, MD   Chief Complaint:  weakness  HPI: This is a 78 year old female who presents to the emergency room today with complaints of generalized weakness, urinary incontinence, inability to ambulate. Patient was recently seen at [facility] on June 18 when she had right-sided hip pain. X-rays and at that time showed a right hip dislocation. This was subsequently reduced in the emergency room. The patient had improvement of her symptoms and had knee immobilizer was applied. She was discharged home with plans for followup with orthopedics. Patient reports after she got home she was unable to ambulate. She essentially spends most of her time in bed. She reported worsening urinary incontinence, frequency. She denies any fever, dysuria, pyuria, hematuria. She has felt generally weak. Denies any cough or shortness of breath. Denies any chest pain. Denies any dizziness. She is unable to stand due to weakness and pain in her legs. At baseline she lives independently, ambulates with the assistance of a walker. She occasionally uses a wheelchair she has pain in her legs. She was evaluated in the emergency room where chest x-ray and x-rays of the knee were unremarkable. She was found to have a urinary tract infection. She's been referred for admission.  Allergies:   Allergies  Allergen Reactions  . Clarithromycin   . Detrol (Tolterodine Tartrate)   . Ditropan (Oxybutynin Chloride)   . Prednisone   . Vioxx (Rofecoxib)       Past Medical History  Diagnosis Date  . Hypertension   . Hemorrhoids   . Thyroid disease   . Osteoarthritis   . Seizures   . Depression   . Hyperlipidemia   . Wears glasses   . Cancer     skin  . Bruises easily   . Skin cancer     Past Surgical History  Procedure Date  . Cataract extraction, bilateral   . Carpal tunnel release     left hand  . Shoulder arthroscopy 1998    right  . Total hip arthroplasty     right  . Total knee  arthroplasty     right   . Rotator cuff repair     left shoulder  . Total knee arthroplasty     left   . Cholecystectomy   . Breast cyst excision     left  . Abdominal hysterectomy   . Tonsellectomy   . Skin lesion excision 05/25/2011  . Lesion removal 11/11/2011    Procedure: MINOR EXICISION OF LESION;  Surgeon: Kandis Cocking, MD;  Location: [facility];  Service: General;  Laterality: Right;  right leg  . Mass excision 12/21/2011    Procedure: MINOR EXCISION OF MASS;  Surgeon: Kandis Cocking, MD;  Location: [facility];  Service: General;  Laterality: Left;  excision of lesion left leg-3cm    Prior to Admission medications   Medication Sig Start Date End Date Taking? Authorizing Provider  amLODipine (NORVASC) 2.5 MG tablet Take 2.5 mg by mouth daily.  05/28/11  Yes Historical Provider, MD  B Complex-C (B-COMPLEX WITH VITAMIN C) tablet Take 1 tablet by mouth daily. OTC  [facility] brand   Yes Historical Provider, MD  calcium-vitamin D (OSCAL WITH D) 250-125 MG-UNIT per tablet Take 1 tablet by mouth daily.    Yes Historical Provider, MD  Cholecalciferol (VITAMIN D3) 2000 UNITS TABS Take 1 tablet by mouth daily.   Yes Historical Provider, MD  fexofenadine (ALLEGRA) 180 MG tablet Take 180 mg  by mouth daily.   Yes Historical Provider, MD  levothyroxine (SYNTHROID, LEVOTHROID) 125 MCG tablet Take 125 mcg by mouth daily.   Yes Historical Provider, MD  PARoxetine (PAXIL) 20 MG tablet Take 20 mg by mouth daily.    Yes Historical Provider, MD  phenytoin (DILANTIN) 100 MG ER capsule Take 200 mg by mouth at bedtime.   Yes Historical Provider, MD  simvastatin (ZOCOR) 40 MG tablet Take 40 mg by mouth daily.    Yes Historical Provider, MD  spironolactone-hydrochlorothiazide (ALDACTAZIDE) 25-25 MG per tablet Take 1 tablet by mouth daily.    Yes Historical Provider, MD  potassium chloride (K-DUR) 10 MEQ tablet Take 1 tablet (10 mEq total) by mouth 2 (two) times daily. 03/28/12  03/28/13  Drucie Opitz, PA    Social History:  reports that she has never smoked. She has never used smokeless tobacco. She reports that she does not drink alcohol or use illicit drugs.  Family History  Problem Relation Age of Onset  . Heart disease Father   . Heart disease Sister   . Cancer Brother     mouth and throat    Review of Systems: Positives in bold Constitutional: Denies fever, chills, diaphoresis, appetite change and fatigue.  HEENT: Denies photophobia, eye pain, redness, hearing loss, ear pain, congestion, sore throat, rhinorrhea, sneezing, mouth sores, trouble swallowing, neck pain, neck stiffness and tinnitus.   Respiratory: Denies SOB, DOE, cough, chest tightness,  and wheezing.   Cardiovascular: Denies chest pain, palpitations and leg swelling.  Gastrointestinal: Denies nausea, vomiting, abdominal pain, diarrhea, constipation, blood in stool and abdominal distention.  Genitourinary: Denies dysuria, urgency, frequency, hematuria, flank pain and difficulty urinating.  Musculoskeletal: Denies myalgias, back pain, joint swelling, arthralgias and gait problem.  Skin: Denies pallor, rash and wound.  Neurological: Denies dizziness, seizures, syncope, weakness, light-headedness, numbness and headaches.  Hematological: Denies adenopathy. Easy bruising, personal or family bleeding history  Psychiatric/Behavioral: Denies suicidal ideation, mood changes, confusion, nervousness, sleep disturbance and agitation   Physical Exam: Blood pressure 132/49, pulse 79, temperature 98.4 F (36.9 C), temperature source Oral, resp. rate 16, SpO2 97.00%. General: Patient is lying in bed, alert and oriented x3, does not appear to be in any acute distress. HEENT: Normocephalic, atraumatic, pupils are equal round reactive to light Neck: Supple Chest: Clear to auscultation bilaterally Cardiac: S1, S2, regular rate and rhythm Abdomen: Soft, nontender, nondistended, bowel sounds are  active Extremities: 1-2+ pitting edema bilaterally, chronic Neurologic: Grossly intact, nonfocal, unable to test the lower extremities due to pain Psychiatric: Normal affect, does not appear depressed  Labs on Admission:  Results for orders placed during the hospital encounter of 03/30/12 (from the past 48 hour(s))  CBC     Status: Abnormal   Collection Time   03/30/12  2:38 PM      Component Value Range Comment   WBC 8.8  4.0 - 10.5 K/uL    RBC 4.06  3.87 - 5.11 MIL/uL    Hemoglobin 12.8  12.0 - 15.0 g/dL    HCT 40.9  81.1 - 91.4 %    MCV 93.8  78.0 - 100.0 fL    MCH 31.5  26.0 - 34.0 pg    MCHC 33.6  30.0 - 36.0 g/dL    RDW 78.2  95.6 - 21.3 %    Platelets 132 (*) 150 - 400 K/uL   DIFFERENTIAL     Status: Normal   Collection Time   03/30/12  2:38 PM  Component Value Range Comment   Neutrophils Relative 75  43 - 77 %    Neutro Abs 6.6  1.7 - 7.7 K/uL    Lymphocytes Relative 16  12 - 46 %    Lymphs Abs 1.4  0.7 - 4.0 K/uL    Monocytes Relative 8  3 - 12 %    Monocytes Absolute 0.7  0.1 - 1.0 K/uL    Eosinophils Relative 1  0 - 5 %    Eosinophils Absolute 0.1  0.0 - 0.7 K/uL    Basophils Relative 0  0 - 1 %    Basophils Absolute 0.0  0.0 - 0.1 K/uL   COMPREHENSIVE METABOLIC PANEL     Status: Abnormal   Collection Time   03/30/12  2:38 PM      Component Value Range Comment   Sodium 138  135 - 145 mEq/L    Potassium 3.2 (*) 3.5 - 5.1 mEq/L    Chloride 99  96 - 112 mEq/L    CO2 29  19 - 32 mEq/L    Glucose, Bld 100 (*) 70 - 99 mg/dL    BUN 17  6 - 23 mg/dL    Creatinine, Ser 1.61  0.50 - 1.10 mg/dL    Calcium 9.0  8.4 - 09.6 mg/dL    Total Protein 6.2  6.0 - 8.3 g/dL    Albumin 3.1 (*) 3.5 - 5.2 g/dL    AST 64 (*) 0 - 37 U/L    ALT 26  0 - 35 U/L    Alkaline Phosphatase 81  39 - 117 U/L    Total Bilirubin 1.1  0.3 - 1.2 mg/dL    GFR calc non Af Amer 80 (*) >90 mL/min    GFR calc Af Amer >90  >90 mL/min   CARDIAC PANEL(CRET KIN+CKTOT+MB+TROPI)     Status: Abnormal    Collection Time   03/30/12  2:38 PM      Component Value Range Comment   Total CK 1043 (*) 7 - 177 U/L    CK, MB 17.3 (*) 0.3 - 4.0 ng/mL    Troponin I <0.30  <0.30 ng/mL    Relative Index 1.7  0.0 - 2.5   PHENYTOIN LEVEL, TOTAL     Status: Abnormal   Collection Time   03/30/12  2:38 PM      Component Value Range Comment   Phenytoin Lvl 2.4 (*) 10.0 - 20.0 ug/mL   URINALYSIS, ROUTINE W REFLEX MICROSCOPIC     Status: Abnormal   Collection Time   03/30/12  4:35 PM      Component Value Range Comment   Color, Urine YELLOW  YELLOW    APPearance CLEAR  CLEAR    Specific Gravity, Urine 1.020  1.005 - 1.030    pH 6.5  5.0 - 8.0    Glucose, UA NEGATIVE  NEGATIVE mg/dL    Hgb urine dipstick LARGE (*) NEGATIVE    Bilirubin Urine NEGATIVE  NEGATIVE    Ketones, ur 15 (*) NEGATIVE mg/dL    Protein, ur TRACE (*) NEGATIVE mg/dL    Urobilinogen, UA 0.2  0.0 - 1.0 mg/dL    Nitrite POSITIVE (*) NEGATIVE    Leukocytes, UA MODERATE (*) NEGATIVE   URINE MICROSCOPIC-ADD ON     Status: Abnormal   Collection Time   03/30/12  4:35 PM      Component Value Range Comment   Squamous Epithelial / LPF RARE  RARE    WBC,  UA 21-50  <3 WBC/hpf    RBC / HPF 3-6  <3 RBC/hpf    Bacteria, UA MANY (*) RARE     Radiological Exams on Admission: Dg Chest 1 View  03/30/2012  *RADIOLOGY REPORT*  Clinical Data: Fatigue, fall.  CHEST - 1 VIEW  Comparison: 09/29/2009  Findings: Heart size upper normal limits.  Aortic arch atherosclerosis. Lung apices are partially obscured.  Bronchitic changes.  No additional areas of focal consolidation.  No pleural effusion or pneumothorax.  Osteopenia.  Multilevel degenerative changes.  IMPRESSION: Bronchitic changes without focal consolidation.   Recommend PA and lateral radiographs when the patient can tolerate for better characterization.  Original Report Authenticated By: Waneta Martins, M.D.   Dg Knee Complete 4 Views Left  03/30/2012  *RADIOLOGY REPORT*  Clinical Data: Fatigue,  left knee pain.  LEFT KNEE - COMPLETE 4+ VIEW  Comparison: None.  Findings: Changes of left knee replacement.  Small joint effusion. No hardware or bony complicating feature.  No acute bony abnormality.  IMPRESSION: Left knee replacement.  Small joint effusion.  No acute bony abnormality.  Original Report Authenticated By: Cyndie Chime, M.D.    Assessment/Plan Principal Problem:  *UTI (urinary tract infection) Active Problems:  Generalized weakness  Hypokalemia  Dehydration  Hypertension  Hypothyroidism  Rhabdomyolysis  Seizure disorder  Hyperlipidemia  Fall  Plan:  #1 urinary tract infection. Patient will have urine culture sent, we will treat her empirically with Rocephin. This can be further adjusted based on results of urine culture.  #2. Generalized weakness. Likely related to underlying infectious process. We will hydrate her and ask physical therapy to work with her. We will also check TSH  #3. Rhabdomyolysis. Patient does have elevation of her creatine kinase enzymes. She had a recent muscle injury and had closed reduction of dislocated hip. Her troponins are negative and EKG is nonacute. Patient will be adequately hydrated with IV fluids. We will hold her statin for now.  #4. Recent right hip dislocation. We will repeat x-rays of the pelvis since she has had a fall. If plain films are negative for fracture, we will ask physical therapy to assess how much rehabilitation she will need.  #5. Hypothyroidism. Continue outpatient medications and to check TSH.  #6. Hypokalemia. This will be repleted. We'll also check magnesium.  #7. Hyperlipidemia. Hold statin for now.  #8. CODE STATUS. Patient confirms DO NOT RESUSCITATE. Further orders per the clinical course.   Time Spent on Admission:  Nevayah Faust [facility] Pager: [phone] 03/30/2012, 6:02 PM

## 2012-03-31 ENCOUNTER — Encounter (HOSPITAL_COMMUNITY): Payer: Self-pay | Admitting: *Deleted

## 2012-03-31 DIAGNOSIS — E782 Mixed hyperlipidemia: Secondary | ICD-10-CM

## 2012-03-31 DIAGNOSIS — R5381 Other malaise: Secondary | ICD-10-CM

## 2012-03-31 DIAGNOSIS — E860 Dehydration: Secondary | ICD-10-CM

## 2012-03-31 DIAGNOSIS — R5383 Other fatigue: Secondary | ICD-10-CM

## 2012-03-31 DIAGNOSIS — E119 Type 2 diabetes mellitus without complications: Secondary | ICD-10-CM

## 2012-03-31 LAB — BASIC METABOLIC PANEL
Calcium: 8.4 mg/dL (ref 8.4–10.5)
Creatinine, Ser: 0.48 mg/dL — ABNORMAL LOW (ref 0.50–1.10)
GFR calc Af Amer: >90 mL/min (ref >90)
GFR calc non Af Amer: 83 mL/min — ABNORMAL LOW (ref >90)

## 2012-03-31 LAB — CBC
MCHC: 33.9 g/dL (ref 30.0–36.0)
Platelets: 122 10*3/uL — ABNORMAL LOW (ref 150–400)
RDW: 13.9 % (ref 11.5–15.5)
WBC: 8.3 10*3/uL (ref 4.0–10.5)

## 2012-03-31 LAB — MAGNESIUM: Magnesium: 1.7 mg/dL (ref 1.5–2.5)

## 2012-03-31 MED ORDER — SODIUM CHLORIDE 0.9 % IJ SOLN
INTRAMUSCULAR | Status: AC
  Administered 2012-03-31: 3 mL
  Filled 2012-03-31: qty 3

## 2012-03-31 MED ORDER — SODIUM CHLORIDE 0.9 % IJ SOLN
INTRAMUSCULAR | Status: AC
  Administered 2012-03-31: 09:00:00
  Filled 2012-03-31: qty 3

## 2012-03-31 MED ORDER — SODIUM CHLORIDE 0.9 % IJ SOLN
INTRAMUSCULAR | Status: AC
  Administered 2012-03-31: 10 mL
  Filled 2012-03-31: qty 3

## 2012-03-31 NOTE — Progress Notes (Signed)
Subjective: Feels better today, pain controlled.  Worked with physical therapy today.  Unsteady on feet.  Objective: Vital signs in last 24 hours: Temp:  [98.3 F (36.8 C)-99.9 F (37.7 C)] 98.3 F (36.8 C) (06/21 0601) Pulse Rate:  [79-85] 81  (06/21 0601) Resp:  [16] 16  (06/21 0601) BP: (124-141)/(49-74) 124/70 mmHg (06/21 0601) SpO2:  [92 %-97 %] 92 % (06/21 0601) Weight:  [74.889 kg (165 lb 1.6 oz)] 74.889 kg (165 lb 1.6 oz) (06/21 0849) Weight change:  Last BM Date: 03/31/12  Intake/Output from previous day: 06/20 0701 - 06/21 0700 In: 730 [P.O.:120; I.V.:610] Out: -  Total I/O In: 120 [P.O.:120] Out: -    Physical Exam: General: Alert, awake, oriented x3, in no acute distress. HEENT: No bruits, no goiter. Heart: Regular rate and rhythm, without murmurs, rubs, gallops. Lungs: Clear to auscultation bilaterally. Abdomen: Soft, nontender, nondistended, positive bowel sounds. Extremities: +edema in LE b/l Neuro: Grossly intact, nonfocal.    Lab Results: Basic Metabolic Panel:  Basename 03/31/12 0537 03/30/12 1438  NA 139 138  K 3.6 3.2*  CL 103 99  CO2 27 29  GLUCOSE 113* 100*  BUN 15 17  CREATININE 0.48* 0.53  CALCIUM 8.4 9.0  MG 1.7 --  PHOS -- --   Liver Function Tests:  Basename 03/30/12 1438  AST 64*  ALT 26  ALKPHOS 81  BILITOT 1.1  PROT 6.2  ALBUMIN 3.1*   No results found for this basename: LIPASE:2,AMYLASE:2 in the last 72 hours No results found for this basename: AMMONIA:2 in the last 72 hours CBC:  Basename 03/31/12 0537 03/30/12 1438 03/28/12 1420  WBC 8.3 8.8 --  NEUTROABS -- 6.6 4.5  HGB 12.7 12.8 --  HCT 37.5 38.1 --  MCV 94.7 93.8 --  PLT 122* 132* --   Cardiac Enzymes:  Basename 03/31/12 0600 03/30/12 1438  CKTOTAL 538* 1043*  CKMB -- 17.3*  CKMBINDEX -- --  TROPONINI -- <0.30   BNP: No results found for this basename: PROBNP:3 in the last 72 hours D-Dimer: No results found for this basename: DDIMER:2 in the last  72 hours CBG: No results found for this basename: GLUCAP:6 in the last 72 hours Hemoglobin A1C: No results found for this basename: HGBA1C in the last 72 hours Fasting Lipid Panel: No results found for this basename: CHOL,HDL,LDLCALC,TRIG,CHOLHDL,LDLDIRECT in the last 72 hours Thyroid Function Tests: No results found for this basename: TSH,T4TOTAL,FREET4,T3FREE,THYROIDAB in the last 72 hours Anemia Panel: No results found for this basename: VITAMINB12,FOLATE,FERRITIN,TIBC,IRON,RETICCTPCT in the last 72 hours Coagulation: No results found for this basename: LABPROT:2,INR:2 in the last 72 hours Urine Drug Screen: Drugs of Abuse  No results found for this basename: labopia, cocainscrnur, labbenz, amphetmu, thcu, labbarb    Alcohol Level: No results found for this basename: ETH:2 in the last 72 hours Urinalysis:  Basename 03/30/12 1635  COLORURINE YELLOW  LABSPEC 1.020  PHURINE 6.5  GLUCOSEU NEGATIVE  HGBUR LARGE*  BILIRUBINUR NEGATIVE  KETONESUR 15*  PROTEINUR TRACE*  UROBILINOGEN 0.2  NITRITE POSITIVE*  LEUKOCYTESUR MODERATE*    No results found for this or any previous visit (from the past 240 hour(s)).  Studies/Results: Dg Chest 1 View  03/30/2012  *RADIOLOGY REPORT*  Clinical Data: Fatigue, fall.  CHEST - 1 VIEW  Comparison: 09/29/2009  Findings: Heart size upper normal limits.  Aortic arch atherosclerosis. Lung apices are partially obscured.  Bronchitic changes.  No additional areas of focal consolidation.  No pleural effusion or pneumothorax.  Osteopenia.  Multilevel degenerative changes.  IMPRESSION: Bronchitic changes without focal consolidation.   Recommend PA and lateral radiographs when the patient can tolerate for better characterization.  Original Report Authenticated By: Waneta Martins, M.D.   Dg Hip Bilateral Vito Berger  03/30/2012  *RADIOLOGY REPORT*  Clinical Data: Pelvic pain; multiple recent falls, with recent right hip prosthesis dislocation.  BILATERAL  HIP WITH PELVIS - 4+ VIEW  Comparison: Right hip radiographs performed 03/28/2012  Findings: There is no evidence of dislocation of the patient's right hip prosthesis at this time.  There is no evidence of loosening or associated fracture.  The right hip prosthesis is grossly unremarkable in appearance.  The femoral stem is within normal limits.  Marked degenerative change is again noted at the left hip joint, with flattening of the femoral head and diffuse sclerosis, and loss of the joint space.  Sclerotic change is noted at the sacroiliac joints and at the pubic symphysis.  These findings are stable from the prior study.  The visualized bowel gas pattern is grossly unremarkable.  IMPRESSION:  1.  No evidence of right hip prosthesis dislocation; the prosthesis appears intact, without evidence of loosening or fracture. 2.  Marked degenerative change at the left hip joint, with loss of the joint space, flattening of the femoral head and diffuse sclerosis.  Original Report Authenticated By: Tonia Ghent, M.D.   Dg Knee Complete 4 Views Left  03/30/2012  *RADIOLOGY REPORT*  Clinical Data: Fatigue, left knee pain.  LEFT KNEE - COMPLETE 4+ VIEW  Comparison: None.  Findings: Changes of left knee replacement.  Small joint effusion. No hardware or bony complicating feature.  No acute bony abnormality.  IMPRESSION: Left knee replacement.  Small joint effusion.  No acute bony abnormality.  Original Report Authenticated By: Cyndie Chime, M.D.    Medications: Scheduled Meds:   . sodium chloride   Intravenous Once  . aspirin EC  81 mg Oral Daily  . cefTRIAXone (ROCEPHIN)  IV  1 g Intravenous Once  . cefTRIAXone (ROCEPHIN)  IV  1 g Intravenous Q24H  . cholecalciferol  2,000 Units Oral Daily  . enoxaparin  40 mg Subcutaneous Q24H  . levothyroxine  125 mcg Oral QAC breakfast  . PARoxetine  20 mg Oral Daily  . phenytoin  200 mg Oral QHS  . potassium chloride  10 mEq Intravenous Once  . potassium chloride SA  40  mEq Oral Once  . sodium chloride      . DISCONTD: sodium chloride   Intravenous STAT   Continuous Infusions:   . DISCONTD: 0.9 % NaCl with KCl 40 mEq / L 75 mL/hr at 03/31/12 0926   PRN Meds:.acetaminophen, acetaminophen, HYDROcodone-acetaminophen, morphine injection, ondansetron (ZOFRAN) IV, ondansetron, DISCONTD: ondansetron (ZOFRAN) IV  Assessment/Plan:  Principal Problem:  *UTI (urinary tract infection) Active Problems:  Generalized weakness  Hypokalemia  Dehydration  Hypertension  Hypothyroidism  Rhabdomyolysis  Seizure disorder  Hyperlipidemia  Fall  Plan:  1. UTI. Continue rocephin, follow up cultures  2. Dehydration. Improved with IVF  3. Mild rhabdomyolysis.  Improved with IVF  4. Recent right hip dislocation.  Closed reduction done at [place] earlier this week.  Follow up with [facility]  5. Generalized weakness. Seen by physical therapy and recommended SNF placement. Will ask csw to follow.     LOS: 1 day   Alaine Loughney [facility] Pager: [phone] 03/31/2012, 12:47 PM

## 2012-03-31 NOTE — Evaluation (Signed)
Physical Therapy Evaluation Patient Details Name: Erica [name] MRN: [id number] DOB: 1917-10-05 Today's Date: 03/31/2012 Time: 7829-5621 PT Time Calculation (min): 62 min  PT Assessment / Plan / Recommendation Clinical Impression  Pt is a delightful 78 year old who had a 2nd R hip deslocation of R THR last Monday.  It was relacated at the hospital and they asked her to use a R knee immobilizer (I'm not sure why as her quad strength is OK).  She has since lost the ability to stand or walk.  She is very afraid of falling and I think this is a large part of her problem.  I am recommending SNF at d/c and she is very agreeable.    PT Assessment  Patient needs continued PT services    Follow Up Recommendations  Skilled nursing facility    Barriers to Discharge Decreased caregiver support      lEquipment Recommendations  Defer to next venue    Recommendations for Other Services     Frequency Min 3X/week    Precautions / Restrictions Precautions Precautions: Fall Required Braces or Orthoses: Knee Immobilizer - Right Knee Immobilizer - Right: On when out of bed or walking (added after R hip relocation--don't know why????) Restrictions Weight Bearing Restrictions: No   Pertinent Vitals/Pain       Mobility  Bed Mobility Bed Mobility: Sit to Supine;Supine to Sit Supine to Sit: 4: Min assist;HOB elevated Sit to Supine: 1: +2 Total assist Transfers Transfers: Stand to Sit;Sit to Stand;Squat Pivot Transfers Sit to Stand: 1: +2 Total assist;With upper extremity assist;From bed;From chair/3-in-1 (very frightened of falling) Sit to Stand: Patient Percentage: 10% Stand to Sit: 2: Max assist;To bed Squat Pivot Transfers: 2: Max assist;From elevated surface;With upper extremity assistance Transfer via Lift Equipment: Stedy Details for Transfer Assistance: pt feels more secure with stedy lift and needs max assist to stand Ambulation/Gait Ambulation/Gait Assistance: Not tested  (comment) (unable)    Exercises     PT Diagnosis: Generalized weakness (unable to stand or walk)  PT Problem List: Decreased strength;Decreased activity tolerance;Decreased mobility PT Treatment Interventions: Functional mobility training;Therapeutic activities;Therapeutic exercise;Patient/family education   PT Goals Acute Rehab PT Goals PT Goal Formulation: With patient Time For Goal Achievement: 04/07/12 Potential to Achieve Goals: Good Pt will go Supine/Side to Sit: with mod assist;with HOB not 0 degrees (comment degree) (HOB at 30 deg) PT Goal: Supine/Side to Sit - Progress: Goal set today Pt will go Sit to Supine/Side: with max assist;with HOB not 0 degrees (comment degree) PT Goal: Sit to Supine/Side - Progress: Goal set today Pt will go Sit to Stand: with max assist;with upper extremity assist PT Goal: Sit to Stand - Progress: Goal set today Pt will go Stand to Sit: with mod assist;with upper extremity assist PT Goal: Stand to Sit - Progress: Goal set today  Visit Information  Last PT Received On: 03/31/12    Subjective Data  Subjective: I'm kind of clutsy Patient Stated Goal: wants to be able to walk   Prior Functioning  Home Living Lives With: Alone Available Help at Discharge: Family Type of Home: House Home Access: Ramped entrance Home Layout: One level Firefighter: Standard Home Adaptive Equipment: Walker - rolling;Wheelchair - manual Additional Comments: has used the walker for past 6 months Prior Function Level of Independence: Independent with assistive device(s) Able to Take Stairs?: No Driving: No Vocation: Retired Musician: No difficulties    Cognition  Overall Cognitive Status: Appears within functional limits for tasks  assessed/performed Arousal/Alertness: Awake/alert Orientation Level: Appears intact for tasks assessed Behavior During Session: [facility] for tasks performed Cognition - Other Comments: very sharp mentally      Extremity/Trunk Assessment Right Upper Extremity Assessment RUE ROM/Strength/Tone: WFL for tasks assessed Left Upper Extremity Assessment LUE ROM/Strength/Tone: WFL for tasks assessed Right Lower Extremity Assessment RLE ROM/Strength/Tone: Deficits RLE ROM/Strength/Tone Deficits: strength generally 3-/5 at hip, 3+/5 at knee RLE Sensation: WFL - Light Touch RLE Coordination: WFL - gross motor Left Lower Extremity Assessment LLE ROM/Strength/Tone: Deficits LLE ROM/Strength/Tone Deficits: strength generally 3-/5 at hip, 3+/5 at knee LLE Sensation: WFL - Light Touch;WFL - Proprioception LLE Coordination: WFL - gross motor Trunk Assessment Trunk Assessment: Normal   Balance Balance Balance Assessed: No  End of Session PT - End of Session Equipment Utilized During Treatment: Gait belt Activity Tolerance: Patient tolerated treatment well Patient left: in bed;with nursing in room;with call bell/phone within reach Nurse Communication: Mobility status;Need for lift equipment   Konrad Penta 03/31/2012, 9:37 AM

## 2012-03-31 NOTE — Progress Notes (Signed)
Rec'd referral for SNF placement- will ask weekend CSW to assess and proceed with SNF. Reece Levy, MSW, Theresia Majors [phone]

## 2012-03-31 NOTE — Progress Notes (Signed)
UR Chart Review Completed  

## 2012-04-01 DIAGNOSIS — E782 Mixed hyperlipidemia: Secondary | ICD-10-CM

## 2012-04-01 DIAGNOSIS — E860 Dehydration: Secondary | ICD-10-CM

## 2012-04-01 DIAGNOSIS — R5381 Other malaise: Secondary | ICD-10-CM

## 2012-04-01 DIAGNOSIS — R5383 Other fatigue: Secondary | ICD-10-CM

## 2012-04-01 LAB — URINE CULTURE: Culture  Setup Time: 201306210131

## 2012-04-01 MED ORDER — SODIUM CHLORIDE 0.9 % IJ SOLN
INTRAMUSCULAR | Status: AC
  Administered 2012-04-01: 10 mL
  Filled 2012-04-01: qty 3

## 2012-04-01 MED ORDER — LEVOTHYROXINE SODIUM 75 MCG PO TABS
150.0000 ug | ORAL_TABLET | Freq: Every day | ORAL | Status: DC
  Administered 2012-04-02 – 2012-04-03 (×2): 150 ug via ORAL
  Filled 2012-04-01 (×2): qty 2

## 2012-04-01 NOTE — Progress Notes (Signed)
Subjective: This delightful 78 year old lady presented with weakness. She has gram-negative rod UTI, awaiting full identification. She was somewhat clinically dehydrated and now she is clearly improved. She feels somewhat lethargic still and sleepy.           Physical Exam: Blood pressure 128/68, pulse 69, temperature 98.2 F (36.8 C), temperature source Oral, resp. rate 18, height 5' 5.5" (1.664 m), weight 74.889 kg (165 lb 1.6 oz), SpO2 94.00%. She looks systemically well. She is not toxic or septic. Heart sounds are present and normal. Lung fields are clear. She is alert and orientated without any focal neurological signs.   Investigations:  Recent Results (from the past 240 hour(s))  URINE CULTURE     Status: Normal (Preliminary result)   Collection Time   03/30/12  4:35 PM      Component Value Range Status Comment   Specimen Description URINE, CLEAN CATCH   Final    Special Requests NONE   Final    Culture  Setup Time [id number]   Final    Colony Count >=100,000 COLONIES/ML   Final    Culture GRAM NEGATIVE RODS   Final    Report Status PENDING   Incomplete      Basic Metabolic Panel:  Basename 03/31/12 0537 03/30/12 1438  NA 139 138  K 3.6 3.2*  CL 103 99  CO2 27 29  GLUCOSE 113* 100*  BUN 15 17  CREATININE 0.48* 0.53  CALCIUM 8.4 9.0  MG 1.7 --  PHOS -- --   Liver Function Tests:  Basename 03/30/12 1438  AST 64*  ALT 26  ALKPHOS 81  BILITOT 1.1  PROT 6.2  ALBUMIN 3.1*     CBC:  Basename 03/31/12 0537 03/30/12 1438  WBC 8.3 8.8  NEUTROABS -- 6.6  HGB 12.7 12.8  HCT 37.5 38.1  MCV 94.7 93.8  PLT 122* 132*    Dg Chest 1 View  03/30/2012  *RADIOLOGY REPORT*  Clinical Data: Fatigue, fall.  CHEST - 1 VIEW  Comparison: 09/29/2009  Findings: Heart size upper normal limits.  Aortic arch atherosclerosis. Lung apices are partially obscured.  Bronchitic changes.  No additional areas of focal consolidation.  No pleural effusion or pneumothorax.   Osteopenia.  Multilevel degenerative changes.  IMPRESSION: Bronchitic changes without focal consolidation.   Recommend PA and lateral radiographs when the patient can tolerate for better characterization.  Original Report Authenticated By: Waneta Martins, M.D.   Dg Hip Bilateral Vito Berger  03/30/2012  *RADIOLOGY REPORT*  Clinical Data: Pelvic pain; multiple recent falls, with recent right hip prosthesis dislocation.  BILATERAL HIP WITH PELVIS - 4+ VIEW  Comparison: Right hip radiographs performed 03/28/2012  Findings: There is no evidence of dislocation of the patient's right hip prosthesis at this time.  There is no evidence of loosening or associated fracture.  The right hip prosthesis is grossly unremarkable in appearance.  The femoral stem is within normal limits.  Marked degenerative change is again noted at the left hip joint, with flattening of the femoral head and diffuse sclerosis, and loss of the joint space.  Sclerotic change is noted at the sacroiliac joints and at the pubic symphysis.  These findings are stable from the prior study.  The visualized bowel gas pattern is grossly unremarkable.  IMPRESSION:  1.  No evidence of right hip prosthesis dislocation; the prosthesis appears intact, without evidence of loosening or fracture. 2.  Marked degenerative change at the left hip joint, with loss of the joint space,  flattening of the femoral head and diffuse sclerosis.  Original Report Authenticated By: Tonia Ghent, M.D.   Dg Knee Complete 4 Views Left  03/30/2012  *RADIOLOGY REPORT*  Clinical Data: Fatigue, left knee pain.  LEFT KNEE - COMPLETE 4+ VIEW  Comparison: None.  Findings: Changes of left knee replacement.  Small joint effusion. No hardware or bony complicating feature.  No acute bony abnormality.  IMPRESSION: Left knee replacement.  Small joint effusion.  No acute bony abnormality.  Original Report Authenticated By: Cyndie Chime, M.D.      Medications:  Scheduled:   . aspirin  EC  81 mg Oral Daily  . cefTRIAXone (ROCEPHIN)  IV  1 g Intravenous Q24H  . cholecalciferol  2,000 Units Oral Daily  . enoxaparin  40 mg Subcutaneous Q24H  . levothyroxine  150 mcg Oral QAC breakfast  . PARoxetine  20 mg Oral Daily  . phenytoin  200 mg Oral QHS  . sodium chloride      . sodium chloride      . DISCONTD: levothyroxine  125 mcg Oral QAC breakfast    Impression: 1. Gram-negative rod UTI. 2. Dehydration, clinically improved. 3. Mild rhabdomyolysis, improving. 4. Status post right hip dislocation of right total hip replacement, status post replaced.  5. Hypothyroidism, TSH still elevated.     Plan: 1. Continue with current therapy. 2. Await full identification of the organism in the UTI. 3. Increase levothyroxine to 150 mcg daily. 4. Disposition-the skilled nursing facility for rehabilitation when bed available.     LOS: 2 days   Wilson Singer Pager [phone]  04/01/2012, 9:49 AM

## 2012-04-01 NOTE — Clinical Social Work Psychosocial (Signed)
Clinical Social Work Department BRIEF PSYCHOSOCIAL ASSESSMENT 04/01/2012  Patient:  Erica [name], Erica [name]     Account Number:  [id number]     Admit date:  03/30/2012  Clinical Social Worker:  Etta Grandchild  Date/Time:  04/01/2012 01:00 PM  Referred by:  Physician  Date Referred:  03/31/2012 Referred for  SNF Placement   Other Referral:   Interview type:  Patient Other interview type:   & son and daugther in law from [place]    PSYCHOSOCIAL DATA Living Status:  ALONE Admitted from facility:   Level of care:   Primary support name:  Debroah Loop [phone] Primary support relationship to patient:  CHILD, ADULT Degree of support available:   adequate. patient's 76 yr old son-in-law lives next door and provides minimal care. Patient's son and daugther in law are visiting from [place] at this time.    CURRENT CONCERNS Current Concerns  Post-Acute Placement   Other Concerns:    SOCIAL WORK ASSESSMENT / PLAN CSW received referral for SNF placement, PT recommending SNF for rehab.  CSW met with patient at bedside initially. Ms. Ator agrees she needs SNF placement to regain her strength and hopes to safely return home.  She reports her son in law is able to help with minor things and he lives next door.  Her son is currently visiting from [place] to help with placement.  She would like to find a facility in [place].    CSW also met with patient and son/daughter in law and explained SNF search process.  CSW encouraged family to begin looking at facilities and CSW will provide bed offers as soon as possible. CSW and family briefly discussed home care benefits under patient's supplemental plan for future needs.   Assessment/plan status:   Other assessment/ plan:   Information/referral to community resources:   Referral to [facility].    PATIENT'S/FAMILY'S RESPONSE TO PLAN OF CARE: The patient and family are in agreeance that rehab is necessary.  They expressed they are  not familiar with the area's facilities and plan to begin touring SNFs soon.    Kathrin Penner, LCSWA Weekend coverage

## 2012-04-02 DIAGNOSIS — R5383 Other fatigue: Secondary | ICD-10-CM

## 2012-04-02 DIAGNOSIS — E782 Mixed hyperlipidemia: Secondary | ICD-10-CM

## 2012-04-02 DIAGNOSIS — R5381 Other malaise: Secondary | ICD-10-CM

## 2012-04-02 DIAGNOSIS — E860 Dehydration: Secondary | ICD-10-CM

## 2012-04-02 LAB — COMPREHENSIVE METABOLIC PANEL
ALT: 33 U/L (ref 0–35)
AST: 40 U/L — ABNORMAL HIGH (ref 0–37)
Alkaline Phosphatase: 71 U/L (ref 39–117)
CO2: 26 mEq/L (ref 19–32)
Chloride: 103 mEq/L (ref 96–112)
GFR calc non Af Amer: 84 mL/min — ABNORMAL LOW (ref >90)
Potassium: 3.6 mEq/L (ref 3.5–5.1)
Sodium: 138 mEq/L (ref 135–145)
Total Bilirubin: 0.5 mg/dL (ref 0.3–1.2)

## 2012-04-02 LAB — CBC
Hemoglobin: 12 g/dL (ref 12.0–15.0)
Platelets: 149 10*3/uL — ABNORMAL LOW (ref 150–400)
RBC: 3.72 MIL/uL — ABNORMAL LOW (ref 3.87–5.11)
WBC: 5.4 10*3/uL (ref 4.0–10.5)

## 2012-04-02 MED ORDER — LEVOTHYROXINE SODIUM 150 MCG PO TABS: 150.0000 ug | ORAL_TABLET | Freq: Every day | ORAL | Status: DC

## 2012-04-02 MED ORDER — CIPROFLOXACIN HCL 250 MG PO TABS
500.0000 mg | ORAL_TABLET | Freq: Two times a day (BID) | ORAL | Status: DC
  Administered 2012-04-02 – 2012-04-03 (×3): 500 mg via ORAL
  Filled 2012-04-02 (×3): qty 2

## 2012-04-02 MED ORDER — CIPROFLOXACIN HCL 500 MG PO TABS: 500.0000 mg | ORAL_TABLET | Freq: Two times a day (BID) | ORAL | Status: AC

## 2012-04-02 NOTE — Progress Notes (Signed)
Clinical Social Worker has not received any bed offers today. CSW will continue to follow and update patient when bed offers are received.   Rozetta Nunnery MSW, LCSWA (W/E coverage) [phone]

## 2012-04-02 NOTE — Discharge Summary (Addendum)
Physician Discharge Summary  Patient ID: Erica [name] MRN: [id number] DOB/AGE: [birth year] 78 y.o. Primary Care Physician:WILSON,FRED Sherilyn Cooter, MD Admit date: 03/30/2012 Discharge date: 04/03/2012    Discharge Diagnoses:   Principal Diagnosis:  1. Urinary tract infection, present on admission Active problems: 2. Debility/deconditioning. 3. Dehydration, improved. 4. Hypertension. 5. Hypothyroidism, levothyroxine dose increase. 6. Mild rhabdomyolysis, improved.   Medication List  As of 04/03/2012 10:42 AM   STOP taking these medications         potassium chloride 10 MEQ tablet      simvastatin 40 MG tablet      spironolactone-hydrochlorothiazide 25-25 MG per tablet         TAKE these medications         amLODipine 2.5 MG tablet   Commonly known as: NORVASC   Take 2.5 mg by mouth daily.      B-complex with vitamin C tablet   Take 1 tablet by mouth daily. OTC  [facility] brand      calcium-vitamin D 250-125 MG-UNIT per tablet   Commonly known as: OSCAL WITH D   Take 1 tablet by mouth daily.      ciprofloxacin 500 MG tablet   Commonly known as: CIPRO   Take 1 tablet (500 mg total) by mouth 2 (two) times daily.      fexofenadine 180 MG tablet   Commonly known as: ALLEGRA   Take 180 mg by mouth daily.      levothyroxine 150 MCG tablet   Commonly known as: SYNTHROID, LEVOTHROID   Take 1 tablet (150 mcg total) by mouth daily before breakfast.      PARoxetine 20 MG tablet   Commonly known as: PAXIL   Take 20 mg by mouth daily.      phenytoin 100 MG ER capsule   Commonly known as: DILANTIN   Take 200 mg by mouth at bedtime.      Vitamin D3 2000 UNITS Tabs   Take 1 tablet by mouth daily.            Discharged Condition: Stable.    Consults: None.  Significant Diagnostic Studies: Dg Chest 1 View  03/30/2012  *RADIOLOGY REPORT*  Clinical Data: Fatigue, fall.  CHEST - 1 VIEW  Comparison: 09/29/2009  Findings: Heart size upper normal limits.  Aortic arch  atherosclerosis. Lung apices are partially obscured.  Bronchitic changes.  No additional areas of focal consolidation.  No pleural effusion or pneumothorax.  Osteopenia.  Multilevel degenerative changes.  IMPRESSION: Bronchitic changes without focal consolidation.   Recommend PA and lateral radiographs when the patient can tolerate for better characterization.  Original Report Authenticated By: Waneta Martins, M.D.   Dg Hip 1 View Right  03/28/2012  *RADIOLOGY REPORT*  Clinical Data: 78 year old female status post fall with hip arthroplasty dislocation.  Post reduction.  RIGHT HIP - 1 VIEW  Comparison: 1452 hours the same day.  Findings: The AP view 1647 hours.  The femoral component now appears normally aligned over the acetabular component.  Hardware appears intact.  No fracture identified.  IMPRESSION: Successfully reduced right hip arthroplasty judging by this single AP view.  Original Report Authenticated By: Harley Hallmark, M.D.   Dg Hip Complete Right  03/28/2012  *RADIOLOGY REPORT*  Clinical Data: Fall, hip deformity.  RIGHT HIP - COMPLETE 2+ VIEW  Comparison: 03/23/2007  Findings: Right total hip replacement dislocation noted.  The femoral head component projects superior to the acetabular component.  Advanced degenerative changes in the left hip.  Diffuse osteopenia.  IMPRESSION: Right total hip replacement dislocation superiorly.  Original Report Authenticated By: Cyndie Chime, M.D.   Dg Hip Bilateral Vito Berger  03/30/2012  *RADIOLOGY REPORT*  Clinical Data: Pelvic pain; multiple recent falls, with recent right hip prosthesis dislocation.  BILATERAL HIP WITH PELVIS - 4+ VIEW  Comparison: Right hip radiographs performed 03/28/2012  Findings: There is no evidence of dislocation of the patient's right hip prosthesis at this time.  There is no evidence of loosening or associated fracture.  The right hip prosthesis is grossly unremarkable in appearance.  The femoral stem is within normal limits.   Marked degenerative change is again noted at the left hip joint, with flattening of the femoral head and diffuse sclerosis, and loss of the joint space.  Sclerotic change is noted at the sacroiliac joints and at the pubic symphysis.  These findings are stable from the prior study.  The visualized bowel gas pattern is grossly unremarkable.  IMPRESSION:  1.  No evidence of right hip prosthesis dislocation; the prosthesis appears intact, without evidence of loosening or fracture. 2.  Marked degenerative change at the left hip joint, with loss of the joint space, flattening of the femoral head and diffuse sclerosis.  Original Report Authenticated By: Tonia Ghent, M.D.   Dg Knee 1-2 Views Right  03/28/2012  *RADIOLOGY REPORT*  Clinical Data: 78 year old female with pain after fall.  RIGHT KNEE - 1-2 VIEW  Comparison: None.  Findings: Sequelae of right total knee arthroplasty.  Hardware components appear intact and normally aligned.  Postoperative changes to the patella.  Osteopenia.  No acute fracture or dislocation identified.  No definite joint effusion.  IMPRESSION: Previous right knee arthroplasty.  No acute fracture or dislocation identified.  Original Report Authenticated By: Harley Hallmark, M.D.   Dg Tibia/fibula Right  03/28/2012  *RADIOLOGY REPORT*  Clinical Data: 78 year old female status post fall with pain.  RIGHT TIBIA AND FIBULA - 2 VIEW  Comparison: Right knee series from the same day.  Findings: Right knee total arthroplasty again noted.  Osteopenia. Grossly normal alignment at the right ankle.  Irregularity at the medial malleolus, otherwise the right tibia appears intact.  No definite acute fracture of the right fibula.  Calcified atherosclerosis.  IMPRESSION: 1.  Cannot exclude a minimally-displaced fracture of the right medial malleolus.  Recommend dedicated right ankle series. 2.  No other acute fracture identified in the right tib-fib.  Original Report Authenticated By: Harley Hallmark, M.D.     Dg Ankle Complete Right  03/28/2012  *RADIOLOGY REPORT*  Clinical Data: Generalized ankle pain  RIGHT ANKLE - COMPLETE 3+ VIEW  Comparison: Right tibia-fibula same day  Findings: Three views of the right ankle submitted.  Ankle mortise is preserved.  There is diffuse osteopenia.  Again noted cortical irregularity medial malleolus with a subtle lucent line suspicious for nondisplaced fracture.  There is soft tissue swelling adjacent to medial malleolus. Plantar and posterior spurring of the calcaneus.  IMPRESSION: There is diffuse osteopenia.  Again noted cortical irregularity medial malleolus with a subtle lucent line suspicious for nondisplaced fracture.  There is soft tissue swelling adjacent to medial malleolus.  Original Report Authenticated By: Natasha Mead, M.D.   Dg Knee Complete 4 Views Left  03/30/2012  *RADIOLOGY REPORT*  Clinical Data: Fatigue, left knee pain.  LEFT KNEE - COMPLETE 4+ VIEW  Comparison: None.  Findings: Changes of left knee replacement.  Small joint effusion. No hardware or bony complicating feature.  No acute bony abnormality.  IMPRESSION: Left knee replacement.  Small joint effusion.  No acute bony abnormality.  Original Report Authenticated By: Cyndie Chime, M.D.    Lab Results: Basic Metabolic Panel:  [facility] 04/02/12 0645  NA 138  K 3.6  CL 103  CO2 26  GLUCOSE 103*  BUN 8  CREATININE 0.46*  CALCIUM 8.7  MG --  PHOS --   Liver Function Tests:  [facility] 04/02/12 0645  AST 40*  ALT 33  ALKPHOS 71  BILITOT 0.5  PROT 5.6*  ALBUMIN 2.5*     CBC:  Basename 04/02/12 0645  WBC 5.4  NEUTROABS --  HGB 12.0  HCT 35.1*  MCV 94.4  PLT 149*    Recent Results (from the past 240 hour(s))  URINE CULTURE     Status: Normal   Collection Time   03/30/12  4:35 PM      Component Value Range Status Comment   Specimen Description URINE, CLEAN CATCH   Final    Special Requests NONE   Final    Culture  Setup Time [id number]   Final    Colony Count  >=100,000 COLONIES/ML   Final    Culture ENTEROBACTER CLOACAE   Final    Report Status 04/01/2012 FINAL   Final    Organism ID, Bacteria ENTEROBACTER CLOACAE   Final      Hospital Course: This very pleasant 78 year old lady was admitted with symptoms of weakness, urinary incontinence and inability to ambulate. She had been recently seen at [facility] on June 18 when she had right-sided hip pain and x-rays at that time showed a right hip dislocation. This was subsequently reduced in the emergency room. The patient did have improvement of her symptoms and had a knee immobilizer applied. She was discharged home with plans for followup but the patient described that after she got home she was unable to ambulate and felt rather weak. She was admitted due to these symptoms and there were no focal abnormalities found. X-rays of the knee were unremarkable. Urine studies did indicate a UTI and she was treated with intravenous Rocephin. Urine culture has shown the organism Enterobacter and this is sensitive to ciprofloxacin as well as Rocephin. She is now on oral ciprofloxacin. She is taking by mouth well. She is mobilizing to some degree. She has been evaluated by physical therapy who feels that she would definitely benefit from further rehabilitation in a skilled nursing facility. She is agreeable to this.  Discharge Exam: Blood pressure 125/65, pulse 63, temperature 97.8 F (36.6 C), temperature source Oral, resp. rate 20, height 5' 5.5" (1.664 m), weight 74.889 kg (165 lb 1.6 oz), SpO2 96.00%. She looks systemically well. Heart sounds are present and normal. Lung fields are clear. She is alert and orientated without any focal neural signs. Abdomen is soft and nontender.  Disposition: Skilled nursing facility when bed is available. She will finish the course of antibiotics for her UTI and after rehabilitation in the skilled nursing facility, hopefully she will be able to be discharged  home.  Discharge Orders    Future Appointments: Provider: Department: Dept Phone: Center:   06/09/2012 2:10 PM Kandis Cocking, MD [facility] [phone] None     Future Orders Please Complete By Expires   Diet - low sodium heart healthy      Diet - low sodium heart healthy      Increase activity slowly      Increase activity slowly  SignedErick Blinks Pager [phone]  04/03/2012, 10:42 AM

## 2012-04-03 ENCOUNTER — Inpatient Hospital Stay
Admission: RE | Admit: 2012-04-03 | Discharge: 2012-05-12 | Disposition: A | Discharge status: Home / Self Care | Payer: BLUE CROSS/BLUE SHIELD | Source: Ambulatory Visit | Attending: Internal Medicine | Admitting: Internal Medicine

## 2012-04-03 DIAGNOSIS — R5381 Other malaise: Secondary | ICD-10-CM

## 2012-04-03 DIAGNOSIS — E782 Mixed hyperlipidemia: Secondary | ICD-10-CM

## 2012-04-03 DIAGNOSIS — E860 Dehydration: Secondary | ICD-10-CM

## 2012-04-03 DIAGNOSIS — R5383 Other fatigue: Secondary | ICD-10-CM

## 2012-04-03 DIAGNOSIS — E119 Type 2 diabetes mellitus without complications: Secondary | ICD-10-CM

## 2012-04-03 NOTE — Clinical Social Work Placement (Signed)
Clinical Social Work Department CLINICAL SOCIAL WORK PLACEMENT NOTE 04/03/2012  Patient:  Erica [name], Erica [name]  Account Number:  [id number] Admit date:  03/30/2012  Clinical Social Worker:  Derenda Fennel, LCSW  Date/time:  04/03/2012 11:00 AM  Clinical Social Work is seeking post-discharge placement for this patient at the following level of care:   SKILLED NURSING   (*CSW will update this form in Epic as items are completed)   04/01/2012  Patient/family provided with [facility] Department of Clinical Social Work's list of facilities offering this level of care within the geographic area requested by the patient (or if unable, by the patient's family).  04/03/2012  Patient/family informed of their freedom to choose among providers that offer the needed level of care, that participate in Medicare, Medicaid or managed care program needed by the patient, have an available bed and are willing to accept the patient.  04/01/2012  Patient/family informed of [facility]' ownership interest in [facility], as well as of the fact that they are under no obligation to receive care at this facility.  PASARR submitted to EDS on 04/01/2012 PASARR number received from EDS on 04/01/2012  FL2 transmitted to all facilities in geographic area requested by pt/family on  04/01/2012 FL2 transmitted to all facilities within larger geographic area on   Patient informed that his/her managed care company has contracts with or will negotiate with  certain facilities, including the following:   n/a     Patient/family informed of bed offers received:  04/03/2012 Patient chooses bed at [facility] Physician recommends and patient chooses bed at  [facility]  Patient to be transferred to [facility] on  04/03/2012 Patient to be transferred to facility by RN  The following physician request were entered in Epic:   Additional Comments:  Derenda Fennel, LCSW [phone]

## 2012-04-03 NOTE — Clinical Social Work Note (Signed)
CSW presented bed offers to pt and pt's family.  They choose [facility] and facility notified.  Pt to transfer with RN.  D/C summary faxed. Family had questions about Medicare coverage and CSW explained SNF benefits.   Derenda Fennel, [place] [phone]

## 2012-04-03 NOTE — Progress Notes (Signed)
Subjective: Patient denies any complaints today, feeling well.  Objective: Vital signs in last 24 hours: Temp:  [97.5 F (36.4 C)-98.3 F (36.8 C)] 97.8 F (36.6 C) (06/24 0625) Pulse Rate:  [63-71] 63  (06/24 0625) Resp:  [18-20] 20  (06/24 0625) BP: (116-128)/(64-66) 125/65 mmHg (06/24 0625) SpO2:  [96 %-97 %] 96 % (06/24 0625) Weight change:  Last BM Date: 04/02/12  Intake/Output from previous day: 06/23 0701 - 06/24 0700 In: 1560 [P.O.:1560] Out: 850 [Urine:850] Total I/O In: 480 [P.O.:480] Out: -    Physical Exam: General: Alert, awake, oriented x3, in no acute distress. HEENT: No bruits, no goiter. Heart: Regular rate and rhythm, without murmurs, rubs, gallops. Lungs: Clear to auscultation bilaterally. Abdomen: Soft, nontender, nondistended, positive bowel sounds. Extremities: No clubbing cyanosis or edema with positive pedal pulses. Neuro: Grossly intact, nonfocal.    Lab Results: Basic Metabolic Panel:  Basename 04/02/12 0645  NA 138  K 3.6  CL 103  CO2 26  GLUCOSE 103*  BUN 8  CREATININE 0.46*  CALCIUM 8.7  MG --  PHOS --   Liver Function Tests:  [facility] 04/02/12 0645  AST 40*  ALT 33  ALKPHOS 71  BILITOT 0.5  PROT 5.6*  ALBUMIN 2.5*   No results found for this basename: LIPASE:2,AMYLASE:2 in the last 72 hours No results found for this basename: AMMONIA:2 in the last 72 hours CBC:  Basename 04/02/12 0645  WBC 5.4  NEUTROABS --  HGB 12.0  HCT 35.1*  MCV 94.4  PLT 149*   Cardiac Enzymes:  Basename 04/02/12 0645  CKTOTAL 97  CKMB --  CKMBINDEX --  TROPONINI --   BNP: No results found for this basename: PROBNP:3 in the last 72 hours D-Dimer: No results found for this basename: DDIMER:2 in the last 72 hours CBG: No results found for this basename: GLUCAP:6 in the last 72 hours Hemoglobin A1C: No results found for this basename: HGBA1C in the last 72 hours Fasting Lipid Panel: No results found for this basename:  CHOL,HDL,LDLCALC,TRIG,CHOLHDL,LDLDIRECT in the last 72 hours Thyroid Function Tests: No results found for this basename: TSH,T4TOTAL,FREET4,T3FREE,THYROIDAB in the last 72 hours Anemia Panel: No results found for this basename: VITAMINB12,FOLATE,FERRITIN,TIBC,IRON,RETICCTPCT in the last 72 hours Coagulation: No results found for this basename: LABPROT:2,INR:2 in the last 72 hours Urine Drug Screen: Drugs of Abuse  No results found for this basename: labopia, cocainscrnur, labbenz, amphetmu, thcu, labbarb    Alcohol Level: No results found for this basename: ETH:2 in the last 72 hours Urinalysis: No results found for this basename: COLORURINE:2,APPERANCEUR:2,LABSPEC:2,PHURINE:2,GLUCOSEU:2,HGBUR:2,BILIRUBINUR:2,KETONESUR:2,PROTEINUR:2,UROBILINOGEN:2,NITRITE:2,LEUKOCYTESUR:2 in the last 72 hours  Recent Results (from the past 240 hour(s))  URINE CULTURE     Status: Normal   Collection Time   03/30/12  4:35 PM      Component Value Range Status Comment   Specimen Description URINE, CLEAN CATCH   Final    Special Requests NONE   Final    Culture  Setup Time [id number]   Final    Colony Count >=100,000 COLONIES/ML   Final    Culture ENTEROBACTER CLOACAE   Final    Report Status 04/01/2012 FINAL   Final    Organism ID, Bacteria ENTEROBACTER CLOACAE   Final     Studies/Results: No results found.  Medications: Scheduled Meds:   . aspirin EC  81 mg Oral Daily  . cholecalciferol  2,000 Units Oral Daily  . ciprofloxacin  500 mg Oral BID  . enoxaparin  40 mg Subcutaneous Q24H  .  levothyroxine  150 mcg Oral QAC breakfast  . PARoxetine  20 mg Oral Daily  . phenytoin  200 mg Oral QHS   Continuous Infusions:  PRN Meds:.acetaminophen, acetaminophen, HYDROcodone-acetaminophen, morphine injection, ondansetron (ZOFRAN) IV, ondansetron  Assessment/Plan:  Principal Problem:  *UTI (urinary tract infection) Active Problems:  Generalized weakness  Hypokalemia  Dehydration  Hypertension   Hypothyroidism  Rhabdomyolysis  Seizure disorder  Hyperlipidemia  Fall  Plan:  UTI, treated with cipro Dehydration, improved with IVF Rhabdomyolysis, improved Generalized weakness, for SNF placement  Patient will be discharged today.  Please refer to discharge summary done yesterday by Dr. Karilyn Cota for further details   LOS: 4 days   [facility] Pager: [phone] 04/03/2012, 10:32 AM

## 2012-04-03 NOTE — Progress Notes (Signed)
Discharged to [facility] in stable condition, out via w/c with staff and family, reported to S. Smalls, LPN.

## 2012-06-09 ENCOUNTER — Encounter (INDEPENDENT_AMBULATORY_CARE_PROVIDER_SITE_OTHER): Payer: Medicare Other | Admitting: Surgery

## 2012-06-25 ENCOUNTER — Emergency Department (HOSPITAL_COMMUNITY): Payer: Medicare Other

## 2012-06-25 ENCOUNTER — Emergency Department (HOSPITAL_COMMUNITY)
Admission: EM | Admit: 2012-06-25 | Discharge: 2012-06-25 | Disposition: A | Discharge status: Home / Self Care | Payer: Medicare Other | Attending: Emergency Medicine | Admitting: Emergency Medicine

## 2012-06-25 ENCOUNTER — Encounter (HOSPITAL_COMMUNITY): Payer: Self-pay

## 2012-06-25 DIAGNOSIS — W19XXXA Unspecified fall, initial encounter: Secondary | ICD-10-CM

## 2012-06-25 DIAGNOSIS — D496 Neoplasm of unspecified behavior of brain: Secondary | ICD-10-CM

## 2012-06-25 DIAGNOSIS — W1809XA Striking against other object with subsequent fall, initial encounter: Secondary | ICD-10-CM | POA: Insufficient documentation

## 2012-06-25 DIAGNOSIS — I10 Essential (primary) hypertension: Secondary | ICD-10-CM | POA: Insufficient documentation

## 2012-06-25 DIAGNOSIS — S0100XA Unspecified open wound of scalp, initial encounter: Secondary | ICD-10-CM | POA: Insufficient documentation

## 2012-06-25 DIAGNOSIS — G939 Disorder of brain, unspecified: Secondary | ICD-10-CM | POA: Insufficient documentation

## 2012-06-25 DIAGNOSIS — S0101XA Laceration without foreign body of scalp, initial encounter: Secondary | ICD-10-CM

## 2012-06-25 DIAGNOSIS — E785 Hyperlipidemia, unspecified: Secondary | ICD-10-CM | POA: Insufficient documentation

## 2012-06-25 DIAGNOSIS — R55 Syncope and collapse: Secondary | ICD-10-CM | POA: Insufficient documentation

## 2012-06-25 DIAGNOSIS — G936 Cerebral edema: Secondary | ICD-10-CM | POA: Insufficient documentation

## 2012-06-25 DIAGNOSIS — Z79899 Other long term (current) drug therapy: Secondary | ICD-10-CM | POA: Insufficient documentation

## 2012-06-25 NOTE — ED Provider Notes (Signed)
History   This chart was scribed for Donnetta Hutching, MD by Toya Smothers. The patient was seen in room APA18/APA18. Patient's care was started at 1016.  CSN: [id number]  Arrival date & time 06/25/12  1016   First MD Initiated Contact with Patient 06/25/12 1110      Chief Complaint  Patient presents with  . Fall  . Laceration   HPI Comments: Pt presents to the Emergency as a level 5 Caveat. HPI and ROS are limited due to Pt's Dementia.  Patient is a 78 y.o. female presenting with fall and skin laceration. The history is provided by the patient. The history is limited by the condition of the patient. No language interpreter was used.  Fall  Laceration    Erica [name] is a 78 y.o. female who is a level 5 caveat for dementia presents to the Emergency Department after an accidental fall. Pt sustained a 3 cm scalp laceration after hitting her head on a wooden door frame. Pt denies LOC neck pain, hip pain, and back pain. HPI and ROS are limited because of the state of the Pt.  Pt lists Dr. Montez Morita as Orthopedist.   Past Medical History  Diagnosis Date  . Hypertension   . Hemorrhoids   . Thyroid disease   . Osteoarthritis   . Seizures   . Depression   . Hyperlipidemia   . Wears glasses   . Cancer     skin  . Bruises easily   . Skin cancer     Past Surgical History  Procedure Date  . Cataract extraction, bilateral   . Carpal tunnel release     left hand  . Shoulder arthroscopy 1998    right  . Total hip arthroplasty     right  . Total knee arthroplasty     right   . Rotator cuff repair     left shoulder  . Total knee arthroplasty     left   . Cholecystectomy   . Breast cyst excision     left  . Abdominal hysterectomy   . Tonsellectomy   . Skin lesion excision 05/25/2011  . Lesion removal 11/11/2011    Procedure: MINOR EXICISION OF LESION;  Surgeon: Kandis Cocking, MD;  Location: [facility];  Service: General;  Laterality: Right;  right leg  .  Mass excision 12/21/2011    Procedure: MINOR EXCISION OF MASS;  Surgeon: Kandis Cocking, MD;  Location: [facility];  Service: General;  Laterality: Left;  excision of lesion left leg-3cm    Family History  Problem Relation Age of Onset  . Heart disease Father   . Heart disease Sister   . Cancer Brother     mouth and throat    History  Substance Use Topics  . Smoking status: Never Smoker   . Smokeless tobacco: Never Used  . Alcohol Use: No   Review of Systems  Unable to perform ROS HENT: Negative for neck pain.   Musculoskeletal: Negative for back pain and arthralgias.  Skin: Positive for wound.    Allergies  Clarithromycin; Detrol; Ditropan; Prednisone; and Vioxx  Home Medications   Current Outpatient Rx  Name Route Sig Dispense Refill  . AMLODIPINE BESYLATE 2.5 MG PO TABS Oral Take 2.5 mg by mouth daily.     . B COMPLEX-C PO TABS Oral Take 1 tablet by mouth daily. OTC  [facility] brand    . CALCIUM CARBONATE-VITAMIN D 250-125 MG-UNIT PO TABS Oral  Take 1 tablet by mouth daily.     Marland Kitchen VITAMIN D3 2000 UNITS PO TABS Oral Take 1 tablet by mouth daily.    Marland Kitchen FEXOFENADINE HCL 180 MG PO TABS Oral Take 180 mg by mouth daily.    Marland Kitchen LEVOTHYROXINE SODIUM 150 MCG PO TABS Oral Take 1 tablet (150 mcg total) by mouth daily before breakfast. 30 tablet 0  . PAROXETINE HCL 20 MG PO TABS Oral Take 20 mg by mouth daily.     Marland Kitchen PHENYTOIN SODIUM EXTENDED 100 MG PO CAPS Oral Take 200 mg by mouth at bedtime.      BP 148/65  Pulse 67  Temp 97.9 F (36.6 C) (Oral)  Resp 18  SpO2 97%  Physical Exam  Nursing note and vitals reviewed. Constitutional: She is oriented to person, place, and time. She appears well-developed and well-nourished.  HENT:  Head: Normocephalic.       3.0 cm lac left parietal occipital area  Eyes: Conjunctivae normal and EOM are normal. Pupils are equal, round, and reactive to light.  Neck: Normal range of motion. Neck supple.  Cardiovascular: Normal rate,  regular rhythm and normal heart sounds.   Pulmonary/Chest: Effort normal and breath sounds normal.  Abdominal: Soft. Bowel sounds are normal.  Musculoskeletal: Normal range of motion.  Neurological: She is alert and oriented to person, place, and time.  Skin: Skin is warm and dry.  Psychiatric: She has a normal mood and affect.    ED Course  LACERATION REPAIR Date/Time: 06/25/2012 3:09 PM Performed by: Donnetta Hutching Authorized by: Donnetta Hutching Consent: Verbal consent obtained. Risks and benefits: risks, benefits and alternatives were discussed Consent given by: patient Patient understanding: patient states understanding of the procedure being performed Imaging studies: imaging studies available Patient identity confirmed: arm band and verbally with patient Time out: Immediately prior to procedure a "time out" was called to verify the correct patient, procedure, equipment, support staff and site/side marked as required. Body area: head/neck Location details: scalp Laceration length: 3 cm Vascular damage: no Patient sedated: no Irrigation solution: saline Amount of cleaning: standard Skin closure: staples Number of sutures: 6 Approximation: close Approximation difficulty: simple Patient tolerance: Patient tolerated the procedure well with no immediate complications.   (including critical care time) DIAGNOSTIC STUDIES: Oxygen Saturation is 97% on room air, normal by my interpretation.    COORDINATION OF CARE: 11:24- Evaluated Pt. Pt is awake and alert. 11:34- Ordered CT Head Wo Contrast 1 time imaging.  Labs Reviewed - No data to display No results found.   No diagnosis found. No results found.  No results found.  MDM  No neuro deficits. Supple laceration repair as noted     I personally performed the services described in this documentation, which was scribed in my presence. The recorded information has been reviewed and considered.      Donnetta Hutching,  MD 07/07/12 [phone]

## 2012-06-25 NOTE — ED Notes (Signed)
Pt states she bumped into something coming from restroom causing her to fall. Head hit the corner of bathroom door. Pt denies LOC. C-collar remains in place. Back board removed on arrival per protocol. Lac to back of head, bleeding controlled. Pt is a/o x 4. NAD.

## 2012-06-25 NOTE — ED Notes (Signed)
Pt via EMS after fall at home. Pt struck head on door frame appr 2 in laceration left posterior head. Denies LOC is immobilized by EMS

## 2012-07-03 ENCOUNTER — Encounter (HOSPITAL_COMMUNITY): Payer: Self-pay | Admitting: Emergency Medicine

## 2012-07-03 ENCOUNTER — Emergency Department (HOSPITAL_COMMUNITY)
Admission: EM | Admit: 2012-07-03 | Discharge: 2012-07-03 | Disposition: A | Discharge status: Home / Self Care | Payer: Medicare Other | Attending: Emergency Medicine | Admitting: Emergency Medicine

## 2012-07-03 DIAGNOSIS — I10 Essential (primary) hypertension: Secondary | ICD-10-CM | POA: Insufficient documentation

## 2012-07-03 DIAGNOSIS — F329 Major depressive disorder, single episode, unspecified: Secondary | ICD-10-CM | POA: Insufficient documentation

## 2012-07-03 DIAGNOSIS — Z881 Allergy status to other antibiotic agents status: Secondary | ICD-10-CM | POA: Insufficient documentation

## 2012-07-03 DIAGNOSIS — Z800 Family history of malignant neoplasm of digestive organs: Secondary | ICD-10-CM | POA: Insufficient documentation

## 2012-07-03 DIAGNOSIS — Z8249 Family history of ischemic heart disease and other diseases of the circulatory system: Secondary | ICD-10-CM | POA: Insufficient documentation

## 2012-07-03 DIAGNOSIS — Z888 Allergy status to other drugs, medicaments and biological substances status: Secondary | ICD-10-CM | POA: Insufficient documentation

## 2012-07-03 DIAGNOSIS — F3289 Other specified depressive episodes: Secondary | ICD-10-CM | POA: Insufficient documentation

## 2012-07-03 DIAGNOSIS — Z4802 Encounter for removal of sutures: Secondary | ICD-10-CM | POA: Insufficient documentation

## 2012-07-03 DIAGNOSIS — Z85828 Personal history of other malignant neoplasm of skin: Secondary | ICD-10-CM | POA: Insufficient documentation

## 2012-07-03 NOTE — ED Notes (Signed)
Here for head staple removal. Denies problems

## 2012-07-06 NOTE — ED Provider Notes (Signed)
History     CSN: [id number]  Arrival date & time 07/03/12  0905   First MD Initiated Contact with Patient 07/03/12 0913      Chief Complaint  Patient presents with  . Suture / Staple Removal    (Consider location/radiation/quality/duration/timing/severity/associated sxs/prior treatment) Patient is a 78 y.o. female presenting with suture removal. The history is provided by the patient.  Suture / Staple Removal  The sutures were placed 7 to 10 days ago. There has been no treatment since the wound repair. There has been no drainage from the wound. There is no redness present. There is no swelling present. The pain has improved.    Past Medical History  Diagnosis Date  . Hypertension   . Hemorrhoids   . Thyroid disease   . Osteoarthritis   . Seizures   . Depression   . Hyperlipidemia   . Wears glasses   . Cancer     skin  . Bruises easily   . Skin cancer     Past Surgical History  Procedure Date  . Cataract extraction, bilateral   . Carpal tunnel release     left hand  . Shoulder arthroscopy 1998    right  . Total hip arthroplasty     right  . Total knee arthroplasty     right   . Rotator cuff repair     left shoulder  . Total knee arthroplasty     left   . Cholecystectomy   . Breast cyst excision     left  . Abdominal hysterectomy   . Tonsellectomy   . Skin lesion excision 05/25/2011  . Lesion removal 11/11/2011    Procedure: MINOR EXICISION OF LESION;  Surgeon: Kandis Cocking, MD;  Location: [facility];  Service: General;  Laterality: Right;  right leg  . Mass excision 12/21/2011    Procedure: MINOR EXCISION OF MASS;  Surgeon: Kandis Cocking, MD;  Location: [facility];  Service: General;  Laterality: Left;  excision of lesion left leg-3cm    Family History  Problem Relation Age of Onset  . Heart disease Father   . Heart disease Sister   . Cancer Brother     mouth and throat    History  Substance Use Topics  . Smoking  status: Never Smoker   . Smokeless tobacco: Never Used  . Alcohol Use: No    OB History    Grav Para Term Preterm Abortions TAB SAB Ect Mult Living                  Review of Systems  Constitutional: Negative for fever and chills.  Respiratory: Negative for shortness of breath and wheezing.   Skin: Positive for wound.  Neurological: Negative for light-headedness, numbness and headaches.    Allergies  Clarithromycin; Detrol; Ditropan; Prednisone; and Vioxx  Home Medications   Current Outpatient Rx  Name Route Sig Dispense Refill  . AMLODIPINE BESYLATE 2.5 MG PO TABS Oral Take 2.5 mg by mouth daily.     . B COMPLEX-C PO TABS Oral Take 1 tablet by mouth daily. OTC  [facility] brand    . CALCIUM CARBONATE-VITAMIN D 250-125 MG-UNIT PO TABS Oral Take 1 tablet by mouth daily.     Marland Kitchen VITAMIN D3 2000 UNITS PO TABS Oral Take 1 tablet by mouth daily.    Marland Kitchen FEXOFENADINE HCL 180 MG PO TABS Oral Take 180 mg by mouth daily.    Marland Kitchen LEVOTHYROXINE SODIUM 150  MCG PO TABS Oral Take 1 tablet (150 mcg total) by mouth daily before breakfast. 30 tablet 0  . PAROXETINE HCL 20 MG PO TABS Oral Take 20 mg by mouth daily.     Marland Kitchen PHENYTOIN SODIUM EXTENDED 100 MG PO CAPS Oral Take 200 mg by mouth at bedtime.      BP 149/57  Pulse 60  Temp 98.7 F (37.1 C) (Oral)  Resp 18  Ht 5\' 6"  (1.676 m)  Wt 161 lb (73.029 kg)  BMI 25.99 kg/m2  SpO2 96%  Physical Exam  Constitutional: She appears well-developed and well-nourished.  HENT:  Head: Normocephalic.  Cardiovascular: Normal rate.   Pulmonary/Chest: Effort normal.  Neurological: She is alert. No sensory deficit.  Skin:       Well healed stapled laceration left scalp.  No erythema, edema or drainage.    ED Course  Procedures (including critical care time)  Labs Reviewed - No data to display No results found.   1. Encounter for staple removal       MDM  Staples removed by RN.  Examined post removal - wound is stable.  Pt tolerated procedure well.   Anticipate prn f/u.         Burgess Amor, PA 07/06/12 2307  Burgess Amor, PA 07/06/12 [phone]

## 2012-07-07 NOTE — ED Provider Notes (Signed)
Medical screening examination/treatment/procedure(s) were performed by non-physician practitioner and as supervising physician I was immediately available for consultation/collaboration.   Maximiliano Cromartie L Sanae Willetts, MD 07/07/12 1452 

## 2012-09-16 ENCOUNTER — Emergency Department (HOSPITAL_COMMUNITY)
Admission: EM | Admit: 2012-09-16 | Discharge: 2012-09-16 | Disposition: A | Discharge status: Home / Self Care | Payer: Medicare Other | Attending: Emergency Medicine | Admitting: Emergency Medicine

## 2012-09-16 ENCOUNTER — Encounter (HOSPITAL_COMMUNITY): Payer: Self-pay | Admitting: Emergency Medicine

## 2012-09-16 DIAGNOSIS — I10 Essential (primary) hypertension: Secondary | ICD-10-CM | POA: Insufficient documentation

## 2012-09-16 DIAGNOSIS — E785 Hyperlipidemia, unspecified: Secondary | ICD-10-CM | POA: Insufficient documentation

## 2012-09-16 DIAGNOSIS — E079 Disorder of thyroid, unspecified: Secondary | ICD-10-CM | POA: Insufficient documentation

## 2012-09-16 DIAGNOSIS — G40802 Other epilepsy, not intractable, without status epilepticus: Secondary | ICD-10-CM | POA: Insufficient documentation

## 2012-09-16 DIAGNOSIS — E7089 Other disorders of aromatic amino-acid metabolism: Secondary | ICD-10-CM | POA: Insufficient documentation

## 2012-09-16 DIAGNOSIS — F3289 Other specified depressive episodes: Secondary | ICD-10-CM | POA: Insufficient documentation

## 2012-09-16 DIAGNOSIS — Z79899 Other long term (current) drug therapy: Secondary | ICD-10-CM | POA: Insufficient documentation

## 2012-09-16 DIAGNOSIS — Z9889 Other specified postprocedural states: Secondary | ICD-10-CM | POA: Insufficient documentation

## 2012-09-16 DIAGNOSIS — H6120 Impacted cerumen, unspecified ear: Secondary | ICD-10-CM | POA: Insufficient documentation

## 2012-09-16 DIAGNOSIS — F329 Major depressive disorder, single episode, unspecified: Secondary | ICD-10-CM | POA: Insufficient documentation

## 2012-09-16 MED ORDER — HYDROGEN PEROXIDE 3 % EX SOLN
Freq: Once | CUTANEOUS | Status: DC
  Filled 2012-09-16: qty 473

## 2012-09-16 NOTE — ED Provider Notes (Signed)
History     CSN: [id number]  Arrival date & time 09/16/12  1638   First MD Initiated Contact with Patient 09/16/12 1728      Chief Complaint  Patient presents with  . Hearing Problem    (Consider location/radiation/quality/duration/timing/severity/associated sxs/prior treatment) HPI  Patient's son-in-law states patient has had complaints of her ears for about one week. She was seen 3 days ago by her PCP and was given drops to place in her ear daily. She was to come back to the office next week for them to flush her ear.They report however today she states she can't hear from her right ear. She denies pain or tinnitus.  PCP Dr. Benedetto Goad at [facility]  Past Medical History  Diagnosis Date  . Hypertension   . Hemorrhoids   . Thyroid disease   . Osteoarthritis   . Seizures   . Depression   . Hyperlipidemia   . Wears glasses   . Cancer     skin  . Bruises easily   . Skin cancer     Past Surgical History  Procedure Date  . Cataract extraction, bilateral   . Carpal tunnel release     left hand  . Shoulder arthroscopy 1998    right  . Total hip arthroplasty     right  . Total knee arthroplasty     right   . Rotator cuff repair     left shoulder  . Total knee arthroplasty     left   . Cholecystectomy   . Breast cyst excision     left  . Abdominal hysterectomy   . Tonsellectomy   . Skin lesion excision 05/25/2011  . Lesion removal 11/11/2011    Procedure: MINOR EXICISION OF LESION;  Surgeon: Kandis Cocking, MD;  Location: [facility];  Service: General;  Laterality: Right;  right leg  . Mass excision 12/21/2011    Procedure: MINOR EXCISION OF MASS;  Surgeon: Kandis Cocking, MD;  Location: [facility];  Service: General;  Laterality: Left;  excision of lesion left leg-3cm    Family History  Problem Relation Age of Onset  . Heart disease Father   . Heart disease Sister   . Cancer Brother     mouth and throat     History  Substance Use Topics  . Smoking status: Never Smoker   . Smokeless tobacco: Never Used  . Alcohol Use: No  Lives at home Lives alone (has family next door)  OB History    Grav Para Term Preterm Abortions TAB SAB Ect Mult Living                  Review of Systems  All other systems reviewed and are negative.    Allergies  Clarithromycin; Detrol; Ditropan; Prednisone; and Vioxx  Home Medications   Current Outpatient Rx  Name  Route  Sig  Dispense  Refill  . AMLODIPINE BESYLATE 2.5 MG PO TABS   Oral   Take 2.5 mg by mouth daily.          . B COMPLEX-C PO TABS   Oral   Take 1 tablet by mouth daily. OTC  [facility] brand         . CALCIUM CARBONATE-VITAMIN D 250-125 MG-UNIT PO TABS   Oral   Take 1 tablet by mouth daily.          Marland Kitchen VITAMIN D3 2000 UNITS PO TABS   Oral  Take 1 tablet by mouth daily.         Marland Kitchen LEVOTHYROXINE SODIUM 150 MCG PO TABS   Oral   Take 1 tablet (150 mcg total) by mouth daily before breakfast.   30 tablet   0   . PAROXETINE HCL 20 MG PO TABS   Oral   Take 20 mg by mouth daily.          Marland Kitchen PHENYTOIN SODIUM EXTENDED 100 MG PO CAPS   Oral   Take 200 mg by mouth at bedtime.           BP 164/56  Pulse 74  Temp 97.2 F (36.2 C) (Oral)  Resp 20  Ht 5\' 2"  (1.575 m)  Wt 165 lb (74.844 kg)  BMI 30.18 kg/m2  SpO2 97%  Vital signs normal     Physical Exam  Nursing note and vitals reviewed. Constitutional: She is oriented to person, place, and time. She appears well-developed and well-nourished.  Non-toxic appearance. She does not appear ill. No distress.       Elderly frail female, can hear to answer my questions at the end of her bed  HENT:  Head: Normocephalic and atraumatic.  Nose: Nose normal. No mucosal edema or rhinorrhea.  Mouth/Throat: Oropharynx is clear and moist and mucous membranes are normal. No dental abscesses or uvula swelling.       Pt has an warty type growth in her left ear helix that son states  has been there a while. I can see her posterior TM on the right which appears normal with some wax build up in the canal, the left TM has moderate wax.   Eyes: Conjunctivae normal and EOM are normal. Pupils are equal, round, and reactive to light.  Neck: Full passive range of motion without pain.  Pulmonary/Chest: Breath sounds normal. She has no rhonchi. She exhibits no crepitus.  Abdominal: Normal appearance.  Musculoskeletal: Normal range of motion. She exhibits no edema.  Neurological: She is alert and oriented to person, place, and time. She has normal strength. No cranial nerve deficit.  Skin: Skin is warm, dry and intact. No rash noted. No erythema. No pallor.  Psychiatric: She has a normal mood and affect. Her speech is normal and behavior is normal. Her mood appears not anxious.    ED Course  Procedures (including critical care time)   Medications  hydrogen peroxide 3 % external solution (not administered)    Ear irrigated by nursing staff who report some wax came out of her left ear but not the right. On reexam she has some redness to the left ext ear canal and a lot of the wax is now gone, her right remains about the same. Pt states she can ear better.     1. Cerumen impaction     Plan discharge  Devoria Albe, MD, FACEP   MDM          Ward Givens, MD 09/16/12 1610

## 2012-09-16 NOTE — ED Notes (Signed)
Irrigated multiple times both ears.  Large amounts of cerumen from L ear.  Minimal from R.  Able to visualize more in L but not able to ID TM.  R ear still impacted.  Dr. Lynelle Doctor notified.

## 2012-09-16 NOTE — ED Notes (Signed)
Cerumen blocking view of TM bilaterally.

## 2012-09-16 NOTE — ED Notes (Signed)
Patient with c/o decreased hearing worsening yesterday and today. Is being treated with docusate to loosen ear wax on Wednesday.

## 2013-02-04 ENCOUNTER — Emergency Department (HOSPITAL_COMMUNITY): Payer: Medicare Other

## 2013-02-04 ENCOUNTER — Inpatient Hospital Stay (HOSPITAL_COMMUNITY)
Admission: EM | Admit: 2013-02-04 | Discharge: 2013-02-09 | DRG: 552 | Disposition: A | Discharge status: Home Health | Payer: Medicare Other | Attending: Internal Medicine | Admitting: Internal Medicine

## 2013-02-04 ENCOUNTER — Encounter (HOSPITAL_COMMUNITY): Payer: Self-pay | Admitting: Emergency Medicine

## 2013-02-04 DIAGNOSIS — M8448XA Pathological fracture, other site, initial encounter for fracture: Secondary | ICD-10-CM | POA: Diagnosis present

## 2013-02-04 DIAGNOSIS — E785 Hyperlipidemia, unspecified: Secondary | ICD-10-CM | POA: Diagnosis present

## 2013-02-04 DIAGNOSIS — A4902 Methicillin resistant Staphylococcus aureus infection, unspecified site: Secondary | ICD-10-CM | POA: Diagnosis present

## 2013-02-04 DIAGNOSIS — Y92009 Unspecified place in unspecified non-institutional (private) residence as the place of occurrence of the external cause: Secondary | ICD-10-CM

## 2013-02-04 DIAGNOSIS — R42 Dizziness and giddiness: Secondary | ICD-10-CM | POA: Diagnosis present

## 2013-02-04 DIAGNOSIS — F329 Major depressive disorder, single episode, unspecified: Secondary | ICD-10-CM | POA: Diagnosis present

## 2013-02-04 DIAGNOSIS — Z66 Do not resuscitate: Secondary | ICD-10-CM | POA: Diagnosis present

## 2013-02-04 DIAGNOSIS — S12100A Unspecified displaced fracture of second cervical vertebra, initial encounter for closed fracture: Principal | ICD-10-CM

## 2013-02-04 DIAGNOSIS — M1990 Unspecified osteoarthritis, unspecified site: Secondary | ICD-10-CM | POA: Diagnosis present

## 2013-02-04 DIAGNOSIS — F3289 Other specified depressive episodes: Secondary | ICD-10-CM | POA: Diagnosis present

## 2013-02-04 DIAGNOSIS — R55 Syncope and collapse: Secondary | ICD-10-CM

## 2013-02-04 DIAGNOSIS — E039 Hypothyroidism, unspecified: Secondary | ICD-10-CM | POA: Diagnosis present

## 2013-02-04 DIAGNOSIS — Z79899 Other long term (current) drug therapy: Secondary | ICD-10-CM

## 2013-02-04 DIAGNOSIS — Z96659 Presence of unspecified artificial knee joint: Secondary | ICD-10-CM

## 2013-02-04 DIAGNOSIS — I10 Essential (primary) hypertension: Secondary | ICD-10-CM | POA: Diagnosis present

## 2013-02-04 DIAGNOSIS — W19XXXA Unspecified fall, initial encounter: Secondary | ICD-10-CM

## 2013-02-04 DIAGNOSIS — S12100S Unspecified displaced fracture of second cervical vertebra, sequela: Secondary | ICD-10-CM

## 2013-02-04 DIAGNOSIS — N390 Urinary tract infection, site not specified: Secondary | ICD-10-CM | POA: Diagnosis present

## 2013-02-04 DIAGNOSIS — Z96649 Presence of unspecified artificial hip joint: Secondary | ICD-10-CM

## 2013-02-04 DIAGNOSIS — G40909 Epilepsy, unspecified, not intractable, without status epilepticus: Secondary | ICD-10-CM | POA: Diagnosis present

## 2013-02-04 DIAGNOSIS — Z85828 Personal history of other malignant neoplasm of skin: Secondary | ICD-10-CM

## 2013-02-04 LAB — BASIC METABOLIC PANEL
BUN: 20 mg/dL (ref 6–23)
Calcium: 9.1 mg/dL (ref 8.4–10.5)
GFR calc non Af Amer: 72 mL/min — ABNORMAL LOW (ref >90)
Glucose, Bld: 129 mg/dL — ABNORMAL HIGH (ref 70–99)
Sodium: 140 mEq/L (ref 135–145)

## 2013-02-04 LAB — CBC WITH DIFFERENTIAL/PLATELET
Eosinophils Absolute: 0.3 10*3/uL (ref 0.0–0.7)
Eosinophils Relative: 5 % (ref 0–5)
Lymphs Abs: 1.4 10*3/uL (ref 0.7–4.0)
MCH: 31.3 pg (ref 26.0–34.0)
MCHC: 34.4 g/dL (ref 30.0–36.0)
MCV: 91 fL (ref 78.0–100.0)
Platelets: 150 10*3/uL (ref 150–400)
RBC: 4.31 MIL/uL (ref 3.87–5.11)

## 2013-02-04 LAB — URINE MICROSCOPIC-ADD ON

## 2013-02-04 LAB — URINALYSIS, ROUTINE W REFLEX MICROSCOPIC
Glucose, UA: NEGATIVE mg/dL
Ketones, ur: NEGATIVE mg/dL
pH: 8 (ref 5.0–8.0)

## 2013-02-04 MED ORDER — DEXTROSE 5 % IV SOLN
1.0000 g | Freq: Once | INTRAVENOUS | Status: AC
  Administered 2013-02-05: 1 g via INTRAVENOUS
  Filled 2013-02-04: qty 10

## 2013-02-04 NOTE — ED Notes (Signed)
Per [facility], pt from home, pt reports falling today x1, but states she falls all the time, pt reports hitting the back of her head on the wall and then falling forward, pt denies LOC, N/V. Pt uses a walker, denies tripping over her walker and/or becoming dizzy. 18 g LAC

## 2013-02-04 NOTE — ED Notes (Signed)
Pt asked about urine. Pt stated that she'll wait for a few then try getting urine to avoid doing a in and out cath.

## 2013-02-04 NOTE — ED Provider Notes (Signed)
History     CSN: [id number]  Arrival date & time 02/04/13  2158   None     Chief Complaint  Patient presents with  . Fall    (Consider location/radiation/quality/duration/timing/severity/associated sxs/prior treatment) HPI Comments: 79 y F with PMH of HTN and HLD that lives at home here after a near syncopal event at home.  No LOC.  She only reports pain in her neck.  On questioning she does reports feeling lightheaded during the event.  No CP, fevers, HA, vision changes, weakness, numbness or other complaints.  Pt switched to [place] collar while maintaining in-line stabilization.  Patient is a 79 y.o. female presenting with fall. The history is provided by the patient.  Fall The accident occurred 1 to 2 hours ago. Fall occurred: while standing in her kitchen. Distance fallen: from standing. She landed on a hard floor. There was no blood loss. The point of impact was the head (lower back). The pain is present in the neck. The pain is at a severity of 5/10. The pain is moderate. There was no entrapment after the fall. Pertinent negatives include no visual change, no fever, no abdominal pain, no bowel incontinence, no vomiting, no headaches and no loss of consciousness. Treatment on scene includes a c-collar and a backboard.    Past Medical History  Diagnosis Date  . Hypertension   . Hemorrhoids   . Thyroid disease   . Osteoarthritis   . Seizures   . Depression   . Hyperlipidemia   . Wears glasses   . Cancer     skin  . Bruises easily   . Skin cancer     Past Surgical History  Procedure Laterality Date  . Cataract extraction, bilateral    . Carpal tunnel release      left hand  . Shoulder arthroscopy  1998    right  . Total hip arthroplasty      right  . Total knee arthroplasty      right   . Rotator cuff repair      left shoulder  . Total knee arthroplasty      left   . Cholecystectomy    . Breast cyst excision      left  . Abdominal hysterectomy    .  Tonsellectomy    . Skin lesion excision  05/25/2011  . Lesion removal  11/11/2011    Procedure: MINOR EXICISION OF LESION;  Surgeon: Kandis Cocking, MD;  Location: [facility];  Service: General;  Laterality: Right;  right leg  . Mass excision  12/21/2011    Procedure: MINOR EXCISION OF MASS;  Surgeon: Kandis Cocking, MD;  Location: [facility];  Service: General;  Laterality: Left;  excision of lesion left leg-3cm    Family History  Problem Relation Age of Onset  . Heart disease Father   . Heart disease Sister   . Cancer Brother     mouth and throat    History  Substance Use Topics  . Smoking status: Never Smoker   . Smokeless tobacco: Never Used  . Alcohol Use: No    OB History   Grav Para Term Preterm Abortions TAB SAB Ect Mult Living                  Review of Systems  Constitutional: Negative for fever.  Gastrointestinal: Negative for vomiting, abdominal pain and bowel incontinence.  Neurological: Negative for loss of consciousness and headaches.  All other  systems reviewed and are negative.    Allergies  Clarithromycin; Ditropan; Prednisone; and Vioxx  Home Medications   Current Outpatient Rx  Name  Route  Sig  Dispense  Refill  . amLODipine (NORVASC) 2.5 MG tablet   Oral   Take 2.5 mg by mouth daily.          . B Complex-C (B-COMPLEX WITH VITAMIN C) tablet   Oral   Take 1 tablet by mouth daily. OTC  [facility] brand         . Cholecalciferol (VITAMIN D3) 2000 UNITS TABS   Oral   Take 1 tablet by mouth daily.         . fexofenadine (ALLEGRA) 180 MG tablet   Oral   Take 180 mg by mouth daily.         . furosemide (LASIX) 20 MG tablet   Oral   Take 20 mg by mouth daily.         Marland Kitchen levothyroxine (SYNTHROID, LEVOTHROID) 125 MCG tablet   Oral   Take 125 mcg by mouth daily before breakfast.         . Multiple Vitamin (MULTIVITAMIN WITH MINERALS) TABS   Oral   Take 1 tablet by mouth daily.         Marland Kitchen PARoxetine  (PAXIL) 20 MG tablet   Oral   Take 20 mg by mouth daily.          . phenytoin (DILANTIN) 100 MG ER capsule   Oral   Take 200 mg by mouth at bedtime.         . potassium chloride (K-DUR,KLOR-CON) 10 MEQ tablet   Oral   Take 10 mEq by mouth daily.           BP 140/65  Pulse 68  Temp(Src) 98.1 F (36.7 C)  Resp 15  SpO2 96%  Physical Exam  Vitals reviewed. Constitutional: She is oriented to person, place, and time. She appears well-developed and well-nourished.  HENT:  Right Ear: External ear normal.  Left Ear: External ear normal.  Mouth/Throat: No oropharyngeal exudate.  Eyes: Conjunctivae and EOM are normal. Pupils are equal, round, and reactive to light.  Neck: Neck supple. Spinous process tenderness present.  Cardiovascular: Normal rate, regular rhythm, normal heart sounds and intact distal pulses.  Exam reveals no gallop and no friction rub.   No murmur heard. Pulmonary/Chest: Effort normal and breath sounds normal.  Abdominal: Soft. Bowel sounds are normal. She exhibits no distension. There is no tenderness.  Musculoskeletal: Normal range of motion. She exhibits no edema.       Back:  Neurological: She is alert and oriented to person, place, and time. She has normal strength. No cranial nerve deficit or sensory deficit.  5/5 strength LEs, sensation grossly intact  Skin: Skin is warm and dry. No rash noted.  Psychiatric: She has a normal mood and affect.    ED Course  Procedures (including critical care time)  Labs Reviewed  BASIC METABOLIC PANEL - Abnormal; Notable for the following:    CO2 33 (*)    Glucose, Bld 129 (*)    GFR calc non Af Amer 72 (*)    GFR calc Af Amer 83 (*)    All other components within normal limits  URINALYSIS, ROUTINE W REFLEX MICROSCOPIC - Abnormal; Notable for the following:    APPearance CLOUDY (*)    Nitrite POSITIVE (*)    Leukocytes, UA MODERATE (*)    All other components within  normal limits  URINE MICROSCOPIC-ADD ON  - Abnormal; Notable for the following:    Bacteria, UA FEW (*)    All other components within normal limits  URINE CULTURE  CBC WITH DIFFERENTIAL  TROPONIN I   Dg Chest 1 View  02/04/2013  *RADIOLOGY REPORT*  Clinical Data: Mid and low back tenderness after fall tonight.  CHEST - 1 VIEW  Comparison: 03/30/2012  Findings: Mild cardiac enlargement.  Pulmonary vascularity is normal for technique.  Calcified and tortuous aorta.  Peribronchial thickening interstitial changes consistent with chronic bronchitis. No focal consolidation.  No blunting of costophrenic angles.  No pneumothorax.  Degenerative changes in the spine and shoulders.  No significant change since previous study.  IMPRESSION: No evidence of active pulmonary disease.  Mild cardiac enlargement and chronic bronchitic changes.   Original Report Authenticated By: Burman Nieves, M.D.    Dg Thoracic Spine 2 View  02/04/2013  *RADIOLOGY REPORT*  Clinical Data: The patient fell backwards tonight and complains of tenderness in the mid and lower back.  THORACIC SPINE - 2 VIEW  Comparison: Chest and lateral 09/29/2009.  Findings: The normal alignment of the thoracic vertebrae.  Diffuse degenerative changes with narrowed thoracic interspaces and associated endplate hypertrophic changes.  Mild anterior compression of the superior endplate of T12 is stable since previous chest radiograph.  No acute compression deformities are demonstrated.  No focal bone lesion or bone destruction.  No paraspinal soft tissue swelling.  IMPRESSION: Diffuse degenerative changes in the thoracic spine.  Stable mild compression of T12.  No acute displaced fractures are demonstrated.   Original Report Authenticated By: Burman Nieves, M.D.    Dg Lumbar Spine 2-3 Views  02/04/2013  *RADIOLOGY REPORT*  Clinical Data: Mid and low back tenderness after fall tonight.  LUMBAR SPINE - 2-3 VIEW  Comparison: CT myelogram 09/26/2003  Findings: Five lumbar type vertebrae.  Diffuse  bone demineralization.  Diffuse degenerative change throughout the lumbar spine with narrowed cervical interspaces and prominent hypertrophic changes in the endplates.  Degenerative changes in the facet joints.  Mild lumbar scoliosis convex towards the right is likely degenerative.  There is superior endplate compression of the T12 vertebra which appears stable since previous chest radiograph from 09/29/2009. Mild retrolisthesis of L2-L3 and mild anterolisthesis of L4-L5. These changes are stable and likely due to degenerative change.  No acute vertebral compression deformities.  No focal bone lesion or bone destruction.  Vascular calcifications.  IMPRESSION: The degenerative changes throughout the lumbar spine with diffuse demineralization.  Chronic compression of the anterior T12 vertebral body.  No acute displaced fractures are identified.   Original Report Authenticated By: Burman Nieves, M.D.    Ct Head Wo Contrast  02/04/2013  *RADIOLOGY REPORT*  Clinical Data:  Fall.  Head trauma.  CT HEAD WITHOUT CONTRAST CT CERVICAL SPINE WITHOUT CONTRAST  Technique:  Multidetector CT imaging of the head and cervical spine was performed following the standard protocol without intravenous contrast.  Multiplanar CT image reconstructions of the cervical spine were also generated.  Comparison:  06/25/2012.  CT HEAD  Findings: Calvarium intact.  Atrophy and chronic ischemic white matter disease is present.  There is a mass in the anterior right middle cranial fossa extending into the anterior cranial fossa. This measures similar to the prior exam of 2013, 32 mm AP and 32 mm transverse.  There is no hemorrhage.  Ventricular size and configuration appears unchanged compared to prior.  No hydrocephalus.  No midline shift.  Minimal mass effect from  the right middle cranial fossa lesion.  IMPRESSION: 1.  No acute intracranial abnormality. 2.  Atrophy and chronic ischemic white matter disease. 3.  Unchanged anterior right middle  cranial fossa mass lesion. This may represent a meningioma given the low growth.  CT CERVICAL SPINE  Findings: There is a comminuted C2 fracture.  This is a combination of a type 3 odontoid fracture with extension into the lateral masses.  No epidural hematoma or compression of the cervical cord as visualized grossly.  The fracture is mildly impacted.  Tip of the odontoid is intact.  Craniocervical junction appears within normal limits.  C1 ring is intact.  Fracture extends into both C2 lateral masses.  3 mm anterolisthesis of C2 on C3 may be traumatic or degenerative.  Severe bilateral facet arthrosis is present.  2 mm anterolisthesis of C3 on C4 appears degenerative, again associated with facet arthrosis.  Severe multilevel degenerative disc disease.  Disc osteophyte complexes extend from C4-C5 through C6-C7.  Ligamentum flavum and nuchal ligament calcification. Multilevel foraminal encroachment associated with uncovertebral spurring.  Central stenosis at C5-C6 there is moderate. Sternoclavicular joint degenerative disease.  No displaced rib fracture.  Lung apices appear within normal limits.  Carotid atherosclerosis incidentally noted.  IMPRESSION: 1.  Comminuted C2 fracture.  This is a combination of a type 3 odontoid fracture with extension of the fracture into both lateral masses.  3 mm anterolisthesis of C2 on C3 may be traumatic or degenerative.  No gross epidural hematoma identified on this noncontrast CT.  The fracture components are minimally displaced. 2.  Multilevel moderate to severe cervical spondylosis.   Original Report Authenticated By: Andreas Newport, M.D.    Ct Cervical Spine Wo Contrast  02/04/2013  *RADIOLOGY REPORT*  Clinical Data:  Fall.  Head trauma.  CT HEAD WITHOUT CONTRAST CT CERVICAL SPINE WITHOUT CONTRAST  Technique:  Multidetector CT imaging of the head and cervical spine was performed following the standard protocol without intravenous contrast.  Multiplanar CT image  reconstructions of the cervical spine were also generated.  Comparison:  06/25/2012.  CT HEAD  Findings: Calvarium intact.  Atrophy and chronic ischemic white matter disease is present.  There is a mass in the anterior right middle cranial fossa extending into the anterior cranial fossa. This measures similar to the prior exam of 2013, 32 mm AP and 32 mm transverse.  There is no hemorrhage.  Ventricular size and configuration appears unchanged compared to prior.  No hydrocephalus.  No midline shift.  Minimal mass effect from the right middle cranial fossa lesion.  IMPRESSION: 1.  No acute intracranial abnormality. 2.  Atrophy and chronic ischemic white matter disease. 3.  Unchanged anterior right middle cranial fossa mass lesion. This may represent a meningioma given the low growth.  CT CERVICAL SPINE  Findings: There is a comminuted C2 fracture.  This is a combination of a type 3 odontoid fracture with extension into the lateral masses.  No epidural hematoma or compression of the cervical cord as visualized grossly.  The fracture is mildly impacted.  Tip of the odontoid is intact.  Craniocervical junction appears within normal limits.  C1 ring is intact.  Fracture extends into both C2 lateral masses.  3 mm anterolisthesis of C2 on C3 may be traumatic or degenerative.  Severe bilateral facet arthrosis is present.  2 mm anterolisthesis of C3 on C4 appears degenerative, again associated with facet arthrosis.  Severe multilevel degenerative disc disease.  Disc osteophyte complexes extend from C4-C5 through C6-C7.  Ligamentum flavum and nuchal ligament calcification. Multilevel foraminal encroachment associated with uncovertebral spurring.  Central stenosis at C5-C6 there is moderate. Sternoclavicular joint degenerative disease.  No displaced rib fracture.  Lung apices appear within normal limits.  Carotid atherosclerosis incidentally noted.  IMPRESSION: 1.  Comminuted C2 fracture.  This is a combination of a type 3  odontoid fracture with extension of the fracture into both lateral masses.  3 mm anterolisthesis of C2 on C3 may be traumatic or degenerative.  No gross epidural hematoma identified on this noncontrast CT.  The fracture components are minimally displaced. 2.  Multilevel moderate to severe cervical spondylosis.   Original Report Authenticated By: Andreas Newport, M.D.     Date: 02/05/2013  Rate: 69  Rhythm: normal sinus rhythm  QRS Axis: left  Intervals: normal  ST/T Wave abnormalities: TWI in V3, NSTWA in lead III  Conduction Disutrbances:right bundle branch block and left anterior fascicular block  Narrative Interpretation: NSR, RBBB, LAFB, possible LVH by voltage criteria, no significant changes from EKG 03/30/12  Old EKG Reviewed: as noted above    1. UTI (urinary tract infection)   2. Near syncope   3. C2 cervical fracture, closed, initial encounter   4. Fall at home, initial encounter       MDM   41 y F with PMH of HTN and HLD that lives at home here after a near syncopal event at home.  No LOC.  She only reports pain in her neck.  On questioning she does reports feeling lightheaded during the event.  No CP, fevers, HA, vision changes, weakness, numbness or other complaints.  Pt switched to [place] collar while maintaining in-line stabilization.  AFVSS, well appearing.  Exam only remarkable for cervical and mid-thoracic TTP.  CT head/C spine, plain films of T&L.  CBC, BMP, trop, EKG, CXR, UA to eval for source of near syncope.  12:41 AM NSU consulted and will follow along, but state that there is nothing to do from a surgical standpoint and she can be maintained in a C-collar at all times.  She has now been switched to an Biochemist, clinical.  NV intact in all extremities after switch.  Will plan for admission to Hospitalist for UTI, PT/OT and pain control.  Rocephin ordered.  Culture pending.  Disposition: Admit  Condition: Stable  Pt seen in conjunction with my attending, Dr.  Lynelle Doctor.  Oleh Genin, MD PGY-II [facility] Emergency Medicine Resident         Oleh Genin, MD 02/05/13 [phone]

## 2013-02-05 ENCOUNTER — Encounter (HOSPITAL_COMMUNITY): Payer: Self-pay | Admitting: Internal Medicine

## 2013-02-05 DIAGNOSIS — N390 Urinary tract infection, site not specified: Secondary | ICD-10-CM

## 2013-02-05 DIAGNOSIS — R42 Dizziness and giddiness: Secondary | ICD-10-CM | POA: Diagnosis present

## 2013-02-05 DIAGNOSIS — W19XXXA Unspecified fall, initial encounter: Secondary | ICD-10-CM

## 2013-02-05 DIAGNOSIS — S12100A Unspecified displaced fracture of second cervical vertebra, initial encounter for closed fracture: Secondary | ICD-10-CM

## 2013-02-05 LAB — TSH: TSH: 5.805 u[IU]/mL — ABNORMAL HIGH (ref 0.350–4.500)

## 2013-02-05 LAB — CBC
HCT: 38.2 % (ref 36.0–46.0)
Hemoglobin: 13.1 g/dL (ref 12.0–15.0)
MCH: 31.2 pg (ref 26.0–34.0)
MCV: 91 fL (ref 78.0–100.0)
Platelets: 151 10*3/uL (ref 150–400)
RBC: 4.2 MIL/uL (ref 3.87–5.11)

## 2013-02-05 LAB — CREATININE, SERUM: GFR calc Af Amer: >90 mL/min (ref >90)

## 2013-02-05 MED ORDER — PAROXETINE HCL 20 MG PO TABS
20.0000 mg | ORAL_TABLET | Freq: Every day | ORAL | Status: DC
  Administered 2013-02-05 – 2013-02-09 (×5): 20 mg via ORAL
  Filled 2013-02-05 (×5): qty 1

## 2013-02-05 MED ORDER — MORPHINE SULFATE 2 MG/ML IJ SOLN
2.0000 mg | Freq: Once | INTRAMUSCULAR | Status: DC
  Filled 2013-02-05: qty 1

## 2013-02-05 MED ORDER — SODIUM CHLORIDE 0.9 % IJ SOLN: 3.0000 mL | INTRAMUSCULAR | Status: DC | PRN

## 2013-02-05 MED ORDER — ENOXAPARIN SODIUM 30 MG/0.3ML ~~LOC~~ SOLN
30.0000 mg | SUBCUTANEOUS | Status: DC
  Administered 2013-02-05 – 2013-02-09 (×5): 30 mg via SUBCUTANEOUS
  Filled 2013-02-05 (×5): qty 0.3

## 2013-02-05 MED ORDER — HEPARIN SODIUM (PORCINE) 5000 UNIT/ML IJ SOLN
5000.0000 [IU] | Freq: Three times a day (TID) | INTRAMUSCULAR | Status: DC
  Administered 2013-02-05: 5000 [IU] via SUBCUTANEOUS
  Filled 2013-02-05 (×4): qty 1

## 2013-02-05 MED ORDER — LEVOTHYROXINE SODIUM 125 MCG PO TABS
125.0000 ug | ORAL_TABLET | Freq: Every day | ORAL | Status: DC
  Administered 2013-02-05 – 2013-02-09 (×5): 125 ug via ORAL
  Filled 2013-02-05 (×6): qty 1

## 2013-02-05 MED ORDER — ACETAMINOPHEN 325 MG PO TABS
650.0000 mg | ORAL_TABLET | Freq: Four times a day (QID) | ORAL | Status: DC | PRN
  Administered 2013-02-05 – 2013-02-09 (×5): 650 mg via ORAL
  Filled 2013-02-05 (×5): qty 2

## 2013-02-05 MED ORDER — POTASSIUM CHLORIDE CRYS ER 10 MEQ PO TBCR
10.0000 meq | EXTENDED_RELEASE_TABLET | Freq: Every day | ORAL | Status: DC
  Administered 2013-02-05 – 2013-02-09 (×5): 10 meq via ORAL
  Filled 2013-02-05 (×5): qty 1

## 2013-02-05 MED ORDER — SODIUM CHLORIDE 0.9 % IV SOLN: 250.0000 mL | INTRAVENOUS | Status: DC | PRN

## 2013-02-05 MED ORDER — DEXTROSE 5 % IV SOLN
1.0000 g | INTRAVENOUS | Status: DC
  Administered 2013-02-05 – 2013-02-06 (×2): 1 g via INTRAVENOUS
  Filled 2013-02-05 (×3): qty 10

## 2013-02-05 MED ORDER — SODIUM CHLORIDE 0.9 % IJ SOLN
3.0000 mL | Freq: Two times a day (BID) | INTRAMUSCULAR | Status: DC
  Administered 2013-02-05 – 2013-02-09 (×9): 3 mL via INTRAVENOUS

## 2013-02-05 MED ORDER — PHENYTOIN SODIUM EXTENDED 100 MG PO CAPS
200.0000 mg | ORAL_CAPSULE | Freq: Every day | ORAL | Status: DC
  Administered 2013-02-05 – 2013-02-08 (×5): 200 mg via ORAL
  Filled 2013-02-05 (×6): qty 2

## 2013-02-05 MED ORDER — LORATADINE 10 MG PO TABS
10.0000 mg | ORAL_TABLET | Freq: Every day | ORAL | Status: DC
  Administered 2013-02-05 – 2013-02-09 (×5): 10 mg via ORAL
  Filled 2013-02-05 (×5): qty 1

## 2013-02-05 MED ORDER — AMLODIPINE BESYLATE 2.5 MG PO TABS
2.5000 mg | ORAL_TABLET | Freq: Every day | ORAL | Status: DC
  Administered 2013-02-05 – 2013-02-09 (×5): 2.5 mg via ORAL
  Filled 2013-02-05 (×5): qty 1

## 2013-02-05 MED ORDER — VITAMIN D3 25 MCG (1000 UNIT) PO TABS
2000.0000 [IU] | ORAL_TABLET | Freq: Every day | ORAL | Status: DC
  Administered 2013-02-05 – 2013-02-09 (×5): 2000 [IU] via ORAL
  Filled 2013-02-05 (×5): qty 2

## 2013-02-05 MED ORDER — SODIUM CHLORIDE 0.9 % IV BOLUS (SEPSIS)
500.0000 mL | Freq: Once | INTRAVENOUS | Status: AC
  Administered 2013-02-05: 500 mL via INTRAVENOUS

## 2013-02-05 MED ORDER — SODIUM CHLORIDE 0.9 % IJ SOLN
3.0000 mL | Freq: Two times a day (BID) | INTRAMUSCULAR | Status: DC
  Administered 2013-02-05 (×2): 3 mL via INTRAVENOUS

## 2013-02-05 NOTE — ED Notes (Signed)
Attempted to call report. RN will call back. 

## 2013-02-05 NOTE — ED Notes (Signed)
[place] c-collar changed to aspen collar by Resident Beverely Pace and Burna Forts, EMT

## 2013-02-05 NOTE — Progress Notes (Signed)
Physical Therapy Evaluation Patient Details Name: JAELYNE DEEG MRN: [id number] DOB: 02-09-1917 Today's Date: 02/05/2013 Time: 8295-6213 PT Time Calculation (min): 25 min  PT Assessment / Plan / Recommendation Clinical Impression  79 yo female admitted post fall resulting in C2 fracture, now with hard cervical collar, presents with decr functional mobilty; Will benefit from PT to address mobility defecits, and initiate rehabilitation which will be continued post-acutely at SNF    PT Assessment  Patient needs continued PT services    Follow Up Recommendations  SNF;Supervision/Assistance - 24 hour    Does the patient have the potential to tolerate intense rehabilitation      Barriers to Discharge Decreased caregiver support Lives alone    Equipment Recommendations  None recommended by PT    Recommendations for Other Services     Frequency Min 5X/week    Precautions / Restrictions Precautions Precautions: Cervical;Fall Required Braces or Orthoses: Cervical Brace Cervical Brace: Hard collar (on at all times)   Pertinent Vitals/Pain 4/10 neck pain; RN notified      Mobility  Bed Mobility Bed Mobility: Rolling Right;Right Sidelying to Sit;Sitting - Scoot to Edge of Bed Rolling Right: 4: Min assist;With rail Right Sidelying to Sit: 4: Min assist Sitting - Scoot to [place] of Bed: 4: Min guard Details for Bed Mobility Assistance: VC's for sequencing and hand placement Transfers Transfers: Sit to Stand;Stand to Sit Sit to Stand: 4: Min assist;With upper extremity assist;From bed Stand to Sit: 4: Min assist;With upper extremity assist;With armrests;To chair/3-in-1 Details for Transfer Assistance: Vc's for safe hand placement Ambulation/Gait Ambulation/Gait Assistance: 4: Min assist Ambulation Distance (Feet): 75 Feet Assistive device: Rolling walker Ambulation/Gait Assistance Details: Cues for safety, RW proximity, and to self-monitor for activity tolerance; Pt  reported her head and neck began to feel heavy Gait Pattern: Step-through pattern Gait velocity: slowed    Exercises     PT Diagnosis: Difficulty walking;Acute pain  PT Problem List: Decreased strength;Decreased activity tolerance;Decreased balance;Decreased mobility;Decreased knowledge of use of DME;Pain PT Treatment Interventions: DME instruction;Gait training;Functional mobility training;Therapeutic activities;Therapeutic exercise;Patient/family education   PT Goals Acute Rehab PT Goals PT Goal Formulation: With patient Time For Goal Achievement: 02/19/13 Potential to Achieve Goals: Good Pt will Roll Supine to Right Side: with supervision;with rail PT Goal: Rolling Supine to Right Side - Progress: Goal set today Pt will Roll Supine to Left Side: with supervision;with rail PT Goal: Rolling Supine to Left Side - Progress: Goal set today Pt will go Supine/Side to Sit: with supervision;with rail PT Goal: Supine/Side to Sit - Progress: Goal set today Pt will go Sit to Supine/Side: with supervision;with rail PT Goal: Sit to Supine/Side - Progress: Goal set today Pt will go Sit to Stand: with supervision PT Goal: Sit to Stand - Progress: Goal set today Pt will go Stand to Sit: with supervision PT Goal: Stand to Sit - Progress: Goal set today Pt will Ambulate: >150 feet;with supervision;with rolling walker PT Goal: Ambulate - Progress: Goal set today  Visit Information  Last PT Received On: 02/05/13 Assistance Needed: +1 PT/OT Co-Evaluation/Treatment: Yes    Subjective Data  Subjective: Agreeable to amb; Enjoys being outside Patient Stated Goal: be able to return home   Prior Functioning  Home Living Lives With: Alone Available Help at Discharge: Family;Available PRN/intermittently (son-in-law next door, retired) Type of Home: House Home Access: Ramped entrance Home Layout: One level Bathroom Shower/Tub: Forensic scientist: Standard Bathroom  Accessibility: Yes How Accessible: Accessible via walker Home Adaptive Equipment: Tub  transfer bench;Walker - rolling;Bedside commode/3-in-1;Wheelchair - manual;Walker - standard;Straight cane Prior Function Level of Independence: Independent with assistive device(s) Able to Take Stairs?: No Driving: No Vocation: Retired Musician: HOH Dominant Hand: Right    Cognition  Cognition Arousal/Alertness: Awake/alert Behavior During Therapy: WFL for tasks assessed/performed Overall Cognitive Status: Within Functional Limits for tasks assessed    Extremity/Trunk Assessment Right Upper Extremity Assessment RUE ROM/Strength/Tone: Within functional levels RUE Sensation: WFL - Light Touch Right Lower Extremity Assessment RLE ROM/Strength/Tone: Deficits RLE ROM/Strength/Tone Deficits: Generally weak, with dependence on UE push, support on RW and armrests Left Lower Extremity Assessment LLE ROM/Strength/Tone: Deficits LLE ROM/Strength/Tone Deficits: same as RLE   Balance    End of Session PT - End of Session Equipment Utilized During Treatment: Gait belt;Cervical collar Activity Tolerance: Patient tolerated treatment well Patient left: in chair;with call bell/phone within reach Nurse Communication: Mobility status  GP     Olen Pel Swartz, [place] [phone]  02/05/2013, 3:59 PM

## 2013-02-05 NOTE — Progress Notes (Signed)
Patient arrived on unit from ED. Patient alert & oriented, no complaints of pain, aspen collar on, telemetry placed per MD order. BP 149/67, HR 74, T 97.9, R 18, O2 96% on ra. Steele Berg RN

## 2013-02-05 NOTE — Consult Note (Signed)
I have reviewed the patient's cervical CT. She has a type III odontoid fracture and is 79 years old. The treatment for this is a hard cervical collar. Please have the patient followup with me in the office in approximately 6 weeks. Please call if I can be of further assistance.

## 2013-02-05 NOTE — Progress Notes (Signed)
Foley placed per MD order. Clear, yellow urine return.  Patient tolerated well. Steele Berg RN

## 2013-02-05 NOTE — H&P (Signed)
[facility] History and Physical  Memory Erica [name] ZDG:[id number] DOB: [birth year]    PCP:   Pamelia Hoit, MD   Chief Complaint: Fall.  HPI: Erica [name] is an 79 y.o. female with hx of intermittent vertigo this past year, HTN, depression, Seizure, presents to the ER after falling.  She denied LOC, HA, chest pain or palpitation.  Evaluation in the ER with negative head CT ( a stable ?meningioma), Neck CT with C2 diminuted Fx, and Lumbar film showed chronic T12 fx.  After neurosurgical consultation over the phone by the EDP, hospitalist was asked to admit patient for frequent falls, pain control and that she lives alone.  Neurosurgery will see her in the am, but recommended only the placement of the [place] collar which has been placed.  Rewiew of Systems:  Constitutional: Negative for malaise, fever and chills. No significant weight loss or weight gain Eyes: Negative for eye pain, redness and discharge, diplopia, visual changes, or flashes of light. ENMT: Negative for ear pain, hoarseness, nasal congestion, sinus pressure and sore throat. No headaches; tinnitus, drooling, or problem swallowing. Cardiovascular: Negative for chest pain, palpitations, diaphoresis, dyspnea and peripheral edema. ; No orthopnea, PND Respiratory: Negative for cough, hemoptysis, wheezing and stridor. No pleuritic chestpain. Gastrointestinal: Negative for nausea, vomiting, diarrhea, constipation, abdominal pain, melena, blood in stool, hematemesis, jaundice and rectal bleeding.    Genitourinary: Negative for frequency, dysuria, incontinence,flank pain and hematuria; Musculoskeletal: Negative for back pain and neck pain. Negative for swelling and trauma.;  Skin: . Negative for pruritus, rash, abrasions, bruising and skin lesion.; ulcerations Neuro: Negative for headache, and neck stiffness. Negative for weakness, altered level of consciousness , altered mental status, extremity weakness,  burning feet, involuntary movement, seizure and syncope.  Psych: negative for anxiety, depression, insomnia, tearfulness, panic attacks, hallucinations, paranoia, suicidal or homicidal ideation    Past Medical History  Diagnosis Date  . Hypertension   . Hemorrhoids   . Thyroid disease   . Osteoarthritis   . Seizures   . Depression   . Hyperlipidemia   . Wears glasses   . Cancer     skin  . Bruises easily   . Skin cancer     Past Surgical History  Procedure Laterality Date  . Cataract extraction, bilateral    . Carpal tunnel release      left hand  . Shoulder arthroscopy  1998    right  . Total hip arthroplasty      right  . Total knee arthroplasty      right   . Rotator cuff repair      left shoulder  . Total knee arthroplasty      left   . Cholecystectomy    . Breast cyst excision      left  . Abdominal hysterectomy    . Tonsellectomy    . Skin lesion excision  05/25/2011  . Lesion removal  11/11/2011    Procedure: MINOR EXICISION OF LESION;  Surgeon: Kandis Cocking, MD;  Location: [facility];  Service: General;  Laterality: Right;  right leg  . Mass excision  12/21/2011    Procedure: MINOR EXCISION OF MASS;  Surgeon: Kandis Cocking, MD;  Location: [facility];  Service: General;  Laterality: Left;  excision of lesion left leg-3cm    Medications:  HOME MEDS: Prior to Admission medications   Medication Sig Start Date End Date Taking? Authorizing Provider  amLODipine (NORVASC) 2.5 MG tablet Take 2.5 mg  by mouth daily.  05/28/11  Yes Historical Provider, MD  B Complex-C (B-COMPLEX WITH VITAMIN C) tablet Take 1 tablet by mouth daily. OTC  [facility] brand   Yes Historical Provider, MD  Cholecalciferol (VITAMIN D3) 2000 UNITS TABS Take 1 tablet by mouth daily.   Yes Historical Provider, MD  fexofenadine (ALLEGRA) 180 MG tablet Take 180 mg by mouth daily.   Yes Historical Provider, MD  furosemide (LASIX) 20 MG tablet Take 20 mg by mouth daily.    Yes Historical Provider, MD  levothyroxine (SYNTHROID, LEVOTHROID) 125 MCG tablet Take 125 mcg by mouth daily before breakfast.   Yes Historical Provider, MD  Multiple Vitamin (MULTIVITAMIN WITH MINERALS) TABS Take 1 tablet by mouth daily.   Yes Historical Provider, MD  PARoxetine (PAXIL) 20 MG tablet Take 20 mg by mouth daily.    Yes Historical Provider, MD  phenytoin (DILANTIN) 100 MG ER capsule Take 200 mg by mouth at bedtime.   Yes Historical Provider, MD  potassium chloride (K-DUR,KLOR-CON) 10 MEQ tablet Take 10 mEq by mouth daily.   Yes Historical Provider, MD     Allergies:  Allergies  Allergen Reactions  . Clarithromycin Other (See Comments)    "made me ill"-reaction years ago  . Ditropan (Oxybutynin Chloride) Itching and Other (See Comments)    Constipation  . Prednisone Other (See Comments)    Stomach pain & dizziness  . Vioxx (Rofecoxib) Other (See Comments)    dizziness    Social History:   reports that she has never smoked. She has never used smokeless tobacco. She reports that she does not drink alcohol or use illicit drugs.  Family History: Family History  Problem Relation Age of Onset  . Heart disease Father   . Heart disease Sister   . Cancer Brother     mouth and throat     Physical Exam: Filed Vitals:   02/05/13 0100 02/05/13 0130 02/05/13 0200 02/05/13 0304  BP: 141/52 147/55 138/51 149/67  Pulse: 73 73 71 74  Temp:    97.9 F (36.6 C)  TempSrc:    Oral  Resp:    18  Height:    5\' 6"  (1.676 m)  Weight:    65.5 kg (144 lb 6.4 oz)  SpO2: 96% 97% 96% 96%   Blood pressure 149/67, pulse 74, temperature 97.9 F (36.6 C), temperature source Oral, resp. rate 18, height 5\' 6"  (1.676 m), weight 65.5 kg (144 lb 6.4 oz), SpO2 96.00%.  GEN:  Pleasant  patient lying in the stretcher in no acute distress; cooperative with exam. PSYCH:  alert and oriented x4; does not appear anxious or depressed; affect is appropriate. HEENT: Mucous membranes pink and  anicteric; PERRLA; EOM intact; no cervical lymphadenopathy nor thyromegaly or carotid bruit; no JVD; There were no stridor. Neck is very supple. Breasts:: Not examined CHEST WALL: No tenderness CHEST: Normal respiration, clear to auscultation bilaterally.  HEART: Regular rate and rhythm.  There are no murmur, rub, or gallops.   BACK: No kyphosis or scoliosis; no CVA tenderness ABDOMEN: soft and non-tender; no masses, no organomegaly, normal abdominal bowel sounds; no pannus; no intertriginous candida. There is no rebound and no distention. Rectal Exam: Not done EXTREMITIES: No bone or joint deformity; age-appropriate arthropathy of the hands and knees; no edema; no ulcerations.  There is no calf tenderness. Genitalia: not examined PULSES: 2+ and symmetric SKIN: Normal hydration no rash or ulceration CNS: Cranial nerves 2-12 grossly intact no focal lateralizing neurologic deficit.  Speech  is fluent; uvula elevated with phonation, facial symmetry and tongue midline. DTR are normal bilaterally, cerebella exam is intact, barbinski is negative and strengths are equaled bilaterally.  No sensory loss.   Labs on Admission:  Basic Metabolic Panel:  Recent Labs Lab 02/04/13 2214  NA 140  K 3.6  CL 101  CO2 33*  GLUCOSE 129*  BUN 20  CREATININE 0.72  CALCIUM 9.1   Liver Function Tests: No results found for this basename: AST, ALT, ALKPHOS, BILITOT, PROT, ALBUMIN,  in the last 168 hours No results found for this basename: LIPASE, AMYLASE,  in the last 168 hours No results found for this basename: AMMONIA,  in the last 168 hours CBC:  Recent Labs Lab 02/04/13 2214  WBC 4.9  NEUTROABS 2.7  HGB 13.5  HCT 39.2  MCV 91.0  PLT 150   Cardiac Enzymes:  Recent Labs Lab 02/04/13 2214  TROPONINI <0.30    CBG: No results found for this basename: GLUCAP,  in the last 168 hours   Radiological Exams on Admission: Dg Chest 1 View  02/04/2013  *RADIOLOGY REPORT*  Clinical Data: Mid and  low back tenderness after fall tonight.  CHEST - 1 VIEW  Comparison: 03/30/2012  Findings: Mild cardiac enlargement.  Pulmonary vascularity is normal for technique.  Calcified and tortuous aorta.  Peribronchial thickening interstitial changes consistent with chronic bronchitis. No focal consolidation.  No blunting of costophrenic angles.  No pneumothorax.  Degenerative changes in the spine and shoulders.  No significant change since previous study.  IMPRESSION: No evidence of active pulmonary disease.  Mild cardiac enlargement and chronic bronchitic changes.   Original Report Authenticated By: Burman Nieves, M.D.    Dg Thoracic Spine 2 View  02/04/2013  *RADIOLOGY REPORT*  Clinical Data: The patient fell backwards tonight and complains of tenderness in the mid and lower back.  THORACIC SPINE - 2 VIEW  Comparison: Chest and lateral 09/29/2009.  Findings: The normal alignment of the thoracic vertebrae.  Diffuse degenerative changes with narrowed thoracic interspaces and associated endplate hypertrophic changes.  Mild anterior compression of the superior endplate of T12 is stable since previous chest radiograph.  No acute compression deformities are demonstrated.  No focal bone lesion or bone destruction.  No paraspinal soft tissue swelling.  IMPRESSION: Diffuse degenerative changes in the thoracic spine.  Stable mild compression of T12.  No acute displaced fractures are demonstrated.   Original Report Authenticated By: Burman Nieves, M.D.    Dg Lumbar Spine 2-3 Views  02/04/2013  *RADIOLOGY REPORT*  Clinical Data: Mid and low back tenderness after fall tonight.  LUMBAR SPINE - 2-3 VIEW  Comparison: CT myelogram 09/26/2003  Findings: Five lumbar type vertebrae.  Diffuse bone demineralization.  Diffuse degenerative change throughout the lumbar spine with narrowed cervical interspaces and prominent hypertrophic changes in the endplates.  Degenerative changes in the facet joints.  Mild lumbar scoliosis convex  towards the right is likely degenerative.  There is superior endplate compression of the T12 vertebra which appears stable since previous chest radiograph from 09/29/2009. Mild retrolisthesis of L2-L3 and mild anterolisthesis of L4-L5. These changes are stable and likely due to degenerative change.  No acute vertebral compression deformities.  No focal bone lesion or bone destruction.  Vascular calcifications.  IMPRESSION: The degenerative changes throughout the lumbar spine with diffuse demineralization.  Chronic compression of the anterior T12 vertebral body.  No acute displaced fractures are identified.   Original Report Authenticated By: Burman Nieves, M.D.    Ct  Head Wo Contrast  02/04/2013  *RADIOLOGY REPORT*  Clinical Data:  Fall.  Head trauma.  CT HEAD WITHOUT CONTRAST CT CERVICAL SPINE WITHOUT CONTRAST  Technique:  Multidetector CT imaging of the head and cervical spine was performed following the standard protocol without intravenous contrast.  Multiplanar CT image reconstructions of the cervical spine were also generated.  Comparison:  06/25/2012.  CT HEAD  Findings: Calvarium intact.  Atrophy and chronic ischemic white matter disease is present.  There is a mass in the anterior right middle cranial fossa extending into the anterior cranial fossa. This measures similar to the prior exam of 2013, 32 mm AP and 32 mm transverse.  There is no hemorrhage.  Ventricular size and configuration appears unchanged compared to prior.  No hydrocephalus.  No midline shift.  Minimal mass effect from the right middle cranial fossa lesion.  IMPRESSION: 1.  No acute intracranial abnormality. 2.  Atrophy and chronic ischemic white matter disease. 3.  Unchanged anterior right middle cranial fossa mass lesion. This may represent a meningioma given the low growth.  CT CERVICAL SPINE  Findings: There is a comminuted C2 fracture.  This is a combination of a type 3 odontoid fracture with extension into the lateral masses.   No epidural hematoma or compression of the cervical cord as visualized grossly.  The fracture is mildly impacted.  Tip of the odontoid is intact.  Craniocervical junction appears within normal limits.  C1 ring is intact.  Fracture extends into both C2 lateral masses.  3 mm anterolisthesis of C2 on C3 may be traumatic or degenerative.  Severe bilateral facet arthrosis is present.  2 mm anterolisthesis of C3 on C4 appears degenerative, again associated with facet arthrosis.  Severe multilevel degenerative disc disease.  Disc osteophyte complexes extend from C4-C5 through C6-C7.  Ligamentum flavum and nuchal ligament calcification. Multilevel foraminal encroachment associated with uncovertebral spurring.  Central stenosis at C5-C6 there is moderate. Sternoclavicular joint degenerative disease.  No displaced rib fracture.  Lung apices appear within normal limits.  Carotid atherosclerosis incidentally noted.  IMPRESSION: 1.  Comminuted C2 fracture.  This is a combination of a type 3 odontoid fracture with extension of the fracture into both lateral masses.  3 mm anterolisthesis of C2 on C3 may be traumatic or degenerative.  No gross epidural hematoma identified on this noncontrast CT.  The fracture components are minimally displaced. 2.  Multilevel moderate to severe cervical spondylosis.   Original Report Authenticated By: Andreas Newport, M.D.    Ct Cervical Spine Wo Contrast  02/04/2013  *RADIOLOGY REPORT*  Clinical Data:  Fall.  Head trauma.  CT HEAD WITHOUT CONTRAST CT CERVICAL SPINE WITHOUT CONTRAST  Technique:  Multidetector CT imaging of the head and cervical spine was performed following the standard protocol without intravenous contrast.  Multiplanar CT image reconstructions of the cervical spine were also generated.  Comparison:  06/25/2012.  CT HEAD  Findings: Calvarium intact.  Atrophy and chronic ischemic white matter disease is present.  There is a mass in the anterior right middle cranial fossa  extending into the anterior cranial fossa. This measures similar to the prior exam of 2013, 32 mm AP and 32 mm transverse.  There is no hemorrhage.  Ventricular size and configuration appears unchanged compared to prior.  No hydrocephalus.  No midline shift.  Minimal mass effect from the right middle cranial fossa lesion.  IMPRESSION: 1.  No acute intracranial abnormality. 2.  Atrophy and chronic ischemic white matter disease. 3.  Unchanged anterior  right middle cranial fossa mass lesion. This may represent a meningioma given the low growth.  CT CERVICAL SPINE  Findings: There is a comminuted C2 fracture.  This is a combination of a type 3 odontoid fracture with extension into the lateral masses.  No epidural hematoma or compression of the cervical cord as visualized grossly.  The fracture is mildly impacted.  Tip of the odontoid is intact.  Craniocervical junction appears within normal limits.  C1 ring is intact.  Fracture extends into both C2 lateral masses.  3 mm anterolisthesis of C2 on C3 may be traumatic or degenerative.  Severe bilateral facet arthrosis is present.  2 mm anterolisthesis of C3 on C4 appears degenerative, again associated with facet arthrosis.  Severe multilevel degenerative disc disease.  Disc osteophyte complexes extend from C4-C5 through C6-C7.  Ligamentum flavum and nuchal ligament calcification. Multilevel foraminal encroachment associated with uncovertebral spurring.  Central stenosis at C5-C6 there is moderate. Sternoclavicular joint degenerative disease.  No displaced rib fracture.  Lung apices appear within normal limits.  Carotid atherosclerosis incidentally noted.  IMPRESSION: 1.  Comminuted C2 fracture.  This is a combination of a type 3 odontoid fracture with extension of the fracture into both lateral masses.  3 mm anterolisthesis of C2 on C3 may be traumatic or degenerative.  No gross epidural hematoma identified on this noncontrast CT.  The fracture components are minimally  displaced. 2.  Multilevel moderate to severe cervical spondylosis.   Original Report Authenticated By: Andreas Newport, M.D.     Assessment/Plan Present on Admission:  . Vertigo . UTI (urinary tract infection) . Seizure disorder . Hypothyroidism . Hypertension . Hyperlipidemia  PLAN:  Will admit her to telemetry.  Continue with the philadelphia collar.  I have continued her on her meds.  Will get OT and PT to evaluate her for gait stability.  She also found to have a UTI, and will be treated with IV Rocephin.  I discussed her code status, and she confirmed that in the case of cardiopulmonary arrest that she would like to be DNR (RN witnessed this as well) .  Will honor her request.  She is stable and will be admitted to telemetry under [facility] service.  Other plans as per orders.  Code Status: DNR.   Houston Siren, MD. [facility] Pager [phone] 7pm to 7am.  02/05/2013, 3:56 AM

## 2013-02-05 NOTE — Progress Notes (Signed)
Pt seen and examined Admitted this am per Dr.Lee after fall with C2 Fracture NSG recommends Supportive care and hard cervical collar and FU as outpt UTI-continue rocephin, FU cultures PT/OT eval DNR  Zannie Cove, MD [phone]

## 2013-02-05 NOTE — ED Provider Notes (Signed)
I reviewed and interpreted the EKG during the patient's evaluation in the ED and agree with the resident's interpretation. I saw and evaluated the patient, reviewed the resident's note and I agree with the findings and plan.  Pt with c2 fracture after fall/syncope.  Placed in c collar.  Nsurg consulted.  Stable in the ED without distress. Plan for admission.   Celene Kras, MD 02/05/13 1136

## 2013-02-05 NOTE — Evaluation (Signed)
Occupational Therapy Evaluation Patient Details Name: Erica [name] MRN: [id number] DOB: [birth year] Today's Date: 02/05/2013 Time: 4098-1191 OT Time Calculation (min): 25 min  OT Assessment / Plan / Recommendation Clinical Impression  This 79 yo female s/p fall with resultant C2 fracture and now with hard cervical collar presents to acute OT with problems below. Will benefit from acute OT with follow up at SNF (perfers [facility]).    OT Assessment  Patient needs continued OT Services    Follow Up Recommendations  SNF    Barriers to Discharge Decreased caregiver support    Equipment Recommendations  None recommended by OT       Frequency  Min 2X/week    Precautions / Restrictions Precautions Precautions: Cervical;Fall Required Braces or Orthoses: Cervical Brace Cervical Brace: Hard collar (on at all times) Restrictions Weight Bearing Restrictions: No   Pertinent Vitals/Pain 4/10 neck, when up from supine to sit    ADL  Eating/Feeding: Simulated;Modified independent Where Assessed - Eating/Feeding: Chair Grooming: Simulated;Set up;Supervision/safety Where Assessed - Grooming: Unsupported sitting Upper Body Bathing: Simulated;Set up;Supervision/safety Where Assessed - Upper Body Bathing: Unsupported sitting Lower Body Bathing: Simulated;Minimal assistance Where Assessed - Lower Body Bathing: Supported sit to stand Upper Body Dressing: Simulated;Minimal assistance Where Assessed - Upper Body Dressing: Unsupported sitting Lower Body Dressing: Simulated;Minimal assistance Where Assessed - Lower Body Dressing: Supported sit to stand Toilet Transfer: Mining engineer Method: Sit to Barista:  (Bed>out door>recliner on other side of bed) Toileting - Architect and Hygiene: Simulated;Min guard Where Assessed - Engineer, mining and Hygiene: Standing Equipment Used: Gait belt;Rolling  walker Transfers/Ambulation Related to ADLs: Min A for all    OT Diagnosis: Generalized weakness;Acute pain  OT Problem List: Decreased strength;Decreased activity tolerance;Impaired balance (sitting and/or standing);Pain;Decreased knowledge of use of DME or AE;Decreased knowledge of precautions OT Treatment Interventions: Self-care/ADL training;DME and/or AE instruction;Patient/family education;Balance training   OT Goals Acute Rehab OT Goals OT Goal Formulation: With patient Time For Goal Achievement: 02/12/13 Potential to Achieve Goals: Good ADL Goals Pt Will Perform Grooming: with set-up;with supervision;Unsupported;Standing at sink ADL Goal: Grooming - Progress: Goal set today Pt Will Perform Upper Body Bathing: with set-up;with supervision;Sitting at sink;Standing at sink;Unsupported ADL Goal: Upper Body Bathing - Progress: Goal set today Pt Will Perform Lower Body Bathing: with set-up;with supervision;Standing at sink;Sitting at sink;Unsupported ADL Goal: Lower Body Bathing - Progress: Goal set today Pt Will Perform Upper Body Dressing: with set-up;with supervision;Unsupported;Sitting, chair;Sitting, bed ADL Goal: Upper Body Dressing - Progress: Goal set today Pt Will Perform Lower Body Dressing: with set-up;with supervision;Unsupported;Sit to stand from chair;Sit to stand from bed ADL Goal: Lower Body Dressing - Progress: Goal set today Pt Will Transfer to Toilet: with supervision;Ambulation;with DME;Regular height toilet;Grab bars;Comfort height toilet;3-in-1 ADL Goal: Toilet Transfer - Progress: Goal set today Pt Will Perform Toileting - Clothing Manipulation: Standing;with modified independence ADL Goal: Toileting - Clothing Manipulation - Progress: Goal set today Pt Will Perform Toileting - Hygiene: with modified independence;Sit to stand from 3-in-1/toilet ADL Goal: Toileting - Hygiene - Progress: Goal set today Miscellaneous OT Goals Miscellaneous OT Goal #1: Pt will be  Mod I in and OOB for BADLs. OT Goal: Miscellaneous Goal #1 - Progress: Goal set today  Visit Information  Last OT Received On: 02/05/13 Assistance Needed: +1 PT/OT Co-Evaluation/Treatment: Yes    Subjective Data  Subjective: My neck hurts (when she sat up).  I haven't driven since I was 89 and I am 89 now  Prior Functioning     Home Living Lives With: Alone Available Help at Discharge: Family;Available PRN/intermittently (son-in-law next door, retired) Type of Home: House Home Access: Ramped entrance Home Layout: One level Bathroom Shower/Tub: Forensic scientist: Standard Bathroom Accessibility: Yes How Accessible: Accessible via walker Home Adaptive Equipment: Tub transfer bench;Walker - rolling;Bedside commode/3-in-1;Wheelchair - manual;Walker - standard;Straight cane Prior Function Level of Independence: Independent with assistive device(s) Able to Take Stairs?: No Driving: No 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 Communication: HOH Dominant Hand: Right         Vision/Perception Vision - History Baseline Vision: Wears glasses only for reading Patient Visual Report: No change from baseline   Cognition  Cognition Arousal/Alertness: Awake/alert Behavior During Therapy: WFL for tasks assessed/performed Overall Cognitive Status: Within Functional Limits for tasks assessed    Extremity/Trunk Assessment Right Upper Extremity Assessment RUE ROM/Strength/Tone: Within functional levels RUE Sensation: WFL - Light Touch Right Lower Extremity Assessment RLE ROM/Strength/Tone: Deficits RLE ROM/Strength/Tone Deficits: Generally weak, with dependence on UE push, support on RW and armrests     Mobility Bed Mobility Bed Mobility: Rolling Right;Right Sidelying to Sit;Sitting - Scoot to Edge of Bed Rolling Right: 4: Min assist;With rail Right Sidelying to Sit: 4: Min assist Sitting - Scoot to [place] of Bed:  4: Min guard Details for Bed Mobility Assistance: VC's for sequencing and hand placement Transfers Transfers: Sit to Stand;Stand to Sit Sit to Stand: 4: Min assist;With upper extremity assist;From bed Stand to Sit: 4: Min assist;With upper extremity assist;With armrests;To chair/3-in-1 Details for Transfer Assistance: Vc's for safe hand placement           End of Session OT - End of Session Equipment Utilized During Treatment: Gait belt Activity Tolerance: Patient limited by fatigue (pt stated she was tired) Patient left: in chair;with call bell/phone within reach Nurse Communication:  (+1  A)       Evette Georges [phone] 02/05/2013, 2:31 PM

## 2013-02-05 NOTE — Progress Notes (Signed)
Utilization Review Completed.   Nea Gittens, RN, BSN Nurse Case Manager  [phone]  

## 2013-02-06 DIAGNOSIS — I10 Essential (primary) hypertension: Secondary | ICD-10-CM

## 2013-02-06 DIAGNOSIS — G40909 Epilepsy, unspecified, not intractable, without status epilepticus: Secondary | ICD-10-CM

## 2013-02-06 DIAGNOSIS — IMO0002 Reserved for concepts with insufficient information to code with codable children: Secondary | ICD-10-CM

## 2013-02-06 LAB — BASIC METABOLIC PANEL
BUN: 17 mg/dL (ref 6–23)
Chloride: 103 mEq/L (ref 96–112)
Glucose, Bld: 105 mg/dL — ABNORMAL HIGH (ref 70–99)
Potassium: 3.7 mEq/L (ref 3.5–5.1)
Sodium: 139 mEq/L (ref 135–145)

## 2013-02-06 LAB — CBC
HCT: 37.6 % (ref 36.0–46.0)
Hemoglobin: 13 g/dL (ref 12.0–15.0)
RBC: 4.11 MIL/uL (ref 3.87–5.11)
WBC: 6 10*3/uL (ref 4.0–10.5)

## 2013-02-06 MED ORDER — ACETAMINOPHEN 325 MG PO TABS: 650.0000 mg | ORAL_TABLET | Freq: Four times a day (QID) | ORAL | Status: DC | PRN

## 2013-02-06 MED ORDER — TRAMADOL HCL 50 MG PO TABS: 50.0000 mg | ORAL_TABLET | Freq: Three times a day (TID) | ORAL | Status: DC | PRN

## 2013-02-06 NOTE — Progress Notes (Signed)
Occupational Therapy Treatment Patient Details Name: Erica [name] MRN: [id number] DOB: Aug 08, 1917 Today's Date: 02/06/2013 Time: 1415-1440 OT Time Calculation (min): 25 min  OT Assessment / Plan / Recommendation Comments on Treatment Session Pt is progressing.  Upon arrival pt had collar on improperly-- OTR/L adjusted the collar and saw that the collar could be smaller. Pt was educated on precautions and provided a handout for her cervical collar.  At end of session pt was able to state 2 precautions.  OTAS contacted ortho tech about collar pads and a smaller collar.  Ortho Tech stated they would handle the situation.      Follow Up Recommendations  SNF       Equipment Recommendations  None recommended by OT       Frequency Min 2X/week   Plan Discharge plan remains appropriate    Precautions / Restrictions Precautions Precautions: Cervical;Fall Required Braces or Orthoses: Cervical Brace Cervical Brace: Hard collar;Applied in sitting position Restrictions Weight Bearing Restrictions: No       ADL  Grooming: Performed;Wash/dry hands;Supervision/safety;Set up Where Assessed - Grooming: Unsupported standing Toilet Transfer: Performed;Min guard Statistician Method: Sit to Barista: Raised toilet seat with arms (or 3-in-1 over toilet) Toileting - Clothing Manipulation and Hygiene: Performed;Min guard Where Assessed - Glass blower/designer Manipulation and Hygiene: Standing Equipment Used: Gait belt;Rolling walker Transfers/Ambulation Related to ADLs: Pt ambulated from recliner>bathroom>sink>recliner with min guard A.  Needed Min VCs for hand placement when transferring from sit<>stand and rw safety ADL Comments: Pt was able to reach out of BOS while at sink washing hands and pt was able to remember to turn her body and not just her head.      OT Goals ADL Goals ADL Goal: Grooming - Progress: Met ADL Goal: Toilet Transfer - Progress:  Progressing toward goals ADL Goal: Toileting - Clothing Manipulation - Progress: Progressing toward goals ADL Goal: Toileting - Hygiene - Progress: Progressing toward goals  Visit Information  Last OT Received On: 02/06/13 Assistance Needed: +1    Subjective Data  Subjective: I'm not alone(when speaking about her support system)      Cognition  Cognition Arousal/Alertness: Awake/alert Behavior During Therapy: WFL for tasks assessed/performed Overall Cognitive Status: Within Functional Limits for tasks assessed    Mobility  Bed Mobility Bed Mobility: Not assessed Details for Bed Mobility Assistance: Up in chair upon arrival Transfers Transfers: Sit to Stand;Stand to Sit Sit to Stand: 5: Supervision;With upper extremity assist;With armrests;From chair/3-in-1 Stand to Sit: 5: Supervision;With upper extremity assist;With armrests;To chair/3-in-1 Details for Transfer Assistance: Min VCs for hand placement for sit<>stand         Balance Balance Balance Assessed: Yes Dynamic Standing Balance Dynamic Standing - Balance Support: No upper extremity supported;During functional activity Dynamic Standing - Level of Assistance:  (min guard A) Dynamic Standing - Comments: Washing hands at sink and managing clothing   End of Session OT - End of Session Equipment Utilized During Treatment: Gait belt;Cervical collar Activity Tolerance: Patient tolerated treatment well Patient left: in chair;with call bell/phone within reach       Dietrich Pates 02/06/2013, 3:33 PM

## 2013-02-06 NOTE — Progress Notes (Addendum)
[facility] PROGRESS NOTE  Irja Lorita Officer XBJ:[id number] DOB: 05-Aug-1917 DOA: 02/04/2013 PCP: Pamelia Hoit, MD  Brief narrative: Erica [name] is an 79 y.o. female with hx of intermittent vertigo this past year, HTN, depression, Seizure, presented to the ER after a mechanical fall.  She was noted to have an acute C2 fracture for which NSG recommended a hard collar only and is also being treated for a UTI.   Assessment/Plan: 1. C2 fracture: -Following fall,  -NSG Dr.Jenkins reviewed case and Ct and recommended Hard cervical collar at all times and FU in his Office  -Pain controlled, Tylenol and tramadol PRN  2. UTI: -Urine Cx pending -Continue rocephin and change to Po based on sensitivities  3. HTN: -stable -continue amlodipine  4. H/o Seizure d/o -continue Dilantin  5. Hypothyroidism: -continue synthroid  DVt proph: lovenox  Code Status: DNR Family Communication: none at bedside, called and d/w Lum Keas her son in Social worker. Disposition Plan: SNF in 1-2 days   Consultants:  Dr.Jenkins reviewed case and Scans  Antibiotics:  IV rocephin 4/28  HPI/Subjective: Doing well, no complaints  Objective: Filed Vitals:   02/05/13 1800 02/05/13 2133 02/06/13 0518 02/06/13 1000  BP: 124/60 143/60 150/69 149/55  Pulse: 79 71 71 65  Temp: 98.2 F (36.8 C) 99 F (37.2 C) 98.6 F (37 C) 98 F (36.7 C)  TempSrc: Oral Oral Oral Oral  Resp: 18 18 18 18   Height:      Weight:  67.2 kg (148 lb 2.4 oz)    SpO2: 98% 95% 94% 96%    Intake/Output Summary (Last 24 hours) at 02/06/13 1207 Last data filed at 02/06/13 0900  Gross per 24 hour  Intake    602 ml  Output    350 ml  Net    252 ml   Filed Weights   02/05/13 0304 02/05/13 2133  Weight: 65.5 kg (144 lb 6.4 oz) 67.2 kg (148 lb 2.4 oz)    Exam:   General:  AAOx3, no distress  HEENT: PERRLA, EOMI, Wearing hard cervical collar  Cardiovascular: S1S2/RRR  Respiratory:  CTAB  Abdomen: soft, NT, BS present  Musculoskeletal: no edema c/c  Neuro: no focal deficits  Data Reviewed: Basic Metabolic Panel:  Recent Labs Lab 02/04/13 2214 02/05/13 0700 02/06/13 0700  NA 140  --  139  K 3.6  --  3.7  CL 101  --  103  CO2 33*  --  29  GLUCOSE 129*  --  105*  BUN 20  --  17  CREATININE 0.72 0.55 0.48*  CALCIUM 9.1  --  8.7   Liver Function Tests: No results found for this basename: AST, ALT, ALKPHOS, BILITOT, PROT, ALBUMIN,  in the last 168 hours No results found for this basename: LIPASE, AMYLASE,  in the last 168 hours No results found for this basename: AMMONIA,  in the last 168 hours CBC:  Recent Labs Lab 02/04/13 2214 02/05/13 0700 02/06/13 0700  WBC 4.9 6.2 6.0  NEUTROABS 2.7  --   --   HGB 13.5 13.1 13.0  HCT 39.2 38.2 37.6  MCV 91.0 91.0 91.5  PLT 150 151 152   Cardiac Enzymes:  Recent Labs Lab 02/04/13 2214  TROPONINI <0.30   BNP (last 3 results) No results found for this basename: PROBNP,  in the last 8760 hours CBG: No results found for this basename: GLUCAP,  in the last 168 hours  Recent Results (from the past 240 hour(s))  URINE CULTURE  Status: None   Collection Time    02/04/13 11:30 PM      Result Value Range Status   Specimen Description URINE, RANDOM   Final   Special Requests CX ADDED AT 2357 ON [id number]   Final   Culture  Setup Time 02/05/2013 02:01   Final   Colony Count >=100,000 COLONIES/ML   Final   Culture     Final   Value: STAPHYLOCOCCUS SPECIES     Note: RIFAMPIN AND GENTAMICIN SHOULD NOT BE USED AS SINGLE DRUGS FOR TREATMENT OF STAPH INFECTIONS.   Report Status PENDING   Incomplete     Studies: Dg Chest 1 View  02/04/2013  *RADIOLOGY REPORT*  Clinical Data: Mid and low back tenderness after fall tonight.  CHEST - 1 VIEW  Comparison: 03/30/2012  Findings: Mild cardiac enlargement.  Pulmonary vascularity is normal for technique.  Calcified and tortuous aorta.  Peribronchial thickening  interstitial changes consistent with chronic bronchitis. No focal consolidation.  No blunting of costophrenic angles.  No pneumothorax.  Degenerative changes in the spine and shoulders.  No significant change since previous study.  IMPRESSION: No evidence of active pulmonary disease.  Mild cardiac enlargement and chronic bronchitic changes.   Original Report Authenticated By: Burman Nieves, M.D.    Dg Thoracic Spine 2 View  02/04/2013  *RADIOLOGY REPORT*  Clinical Data: The patient fell backwards tonight and complains of tenderness in the mid and lower back.  THORACIC SPINE - 2 VIEW  Comparison: Chest and lateral 09/29/2009.  Findings: The normal alignment of the thoracic vertebrae.  Diffuse degenerative changes with narrowed thoracic interspaces and associated endplate hypertrophic changes.  Mild anterior compression of the superior endplate of T12 is stable since previous chest radiograph.  No acute compression deformities are demonstrated.  No focal bone lesion or bone destruction.  No paraspinal soft tissue swelling.  IMPRESSION: Diffuse degenerative changes in the thoracic spine.  Stable mild compression of T12.  No acute displaced fractures are demonstrated.   Original Report Authenticated By: Burman Nieves, M.D.    Dg Lumbar Spine 2-3 Views  02/04/2013  *RADIOLOGY REPORT*  Clinical Data: Mid and low back tenderness after fall tonight.  LUMBAR SPINE - 2-3 VIEW  Comparison: CT myelogram 09/26/2003  Findings: Five lumbar type vertebrae.  Diffuse bone demineralization.  Diffuse degenerative change throughout the lumbar spine with narrowed cervical interspaces and prominent hypertrophic changes in the endplates.  Degenerative changes in the facet joints.  Mild lumbar scoliosis convex towards the right is likely degenerative.  There is superior endplate compression of the T12 vertebra which appears stable since previous chest radiograph from 09/29/2009. Mild retrolisthesis of L2-L3 and mild  anterolisthesis of L4-L5. These changes are stable and likely due to degenerative change.  No acute vertebral compression deformities.  No focal bone lesion or bone destruction.  Vascular calcifications.  IMPRESSION: The degenerative changes throughout the lumbar spine with diffuse demineralization.  Chronic compression of the anterior T12 vertebral body.  No acute displaced fractures are identified.   Original Report Authenticated By: Burman Nieves, M.D.    Ct Head Wo Contrast  02/04/2013  *RADIOLOGY REPORT*  Clinical Data:  Fall.  Head trauma.  CT HEAD WITHOUT CONTRAST CT CERVICAL SPINE WITHOUT CONTRAST  Technique:  Multidetector CT imaging of the head and cervical spine was performed following the standard protocol without intravenous contrast.  Multiplanar CT image reconstructions of the cervical spine were also generated.  Comparison:  06/25/2012.  CT HEAD  Findings: Calvarium intact.  Atrophy  and chronic ischemic white matter disease is present.  There is a mass in the anterior right middle cranial fossa extending into the anterior cranial fossa. This measures similar to the prior exam of 2013, 32 mm AP and 32 mm transverse.  There is no hemorrhage.  Ventricular size and configuration appears unchanged compared to prior.  No hydrocephalus.  No midline shift.  Minimal mass effect from the right middle cranial fossa lesion.  IMPRESSION: 1.  No acute intracranial abnormality. 2.  Atrophy and chronic ischemic white matter disease. 3.  Unchanged anterior right middle cranial fossa mass lesion. This may represent a meningioma given the low growth.  CT CERVICAL SPINE  Findings: There is a comminuted C2 fracture.  This is a combination of a type 3 odontoid fracture with extension into the lateral masses.  No epidural hematoma or compression of the cervical cord as visualized grossly.  The fracture is mildly impacted.  Tip of the odontoid is intact.  Craniocervical junction appears within normal limits.  C1 ring  is intact.  Fracture extends into both C2 lateral masses.  3 mm anterolisthesis of C2 on C3 may be traumatic or degenerative.  Severe bilateral facet arthrosis is present.  2 mm anterolisthesis of C3 on C4 appears degenerative, again associated with facet arthrosis.  Severe multilevel degenerative disc disease.  Disc osteophyte complexes extend from C4-C5 through C6-C7.  Ligamentum flavum and nuchal ligament calcification. Multilevel foraminal encroachment associated with uncovertebral spurring.  Central stenosis at C5-C6 there is moderate. Sternoclavicular joint degenerative disease.  No displaced rib fracture.  Lung apices appear within normal limits.  Carotid atherosclerosis incidentally noted.  IMPRESSION: 1.  Comminuted C2 fracture.  This is a combination of a type 3 odontoid fracture with extension of the fracture into both lateral masses.  3 mm anterolisthesis of C2 on C3 may be traumatic or degenerative.  No gross epidural hematoma identified on this noncontrast CT.  The fracture components are minimally displaced. 2.  Multilevel moderate to severe cervical spondylosis.   Original Report Authenticated By: Andreas Newport, M.D.    Ct Cervical Spine Wo Contrast  02/04/2013  *RADIOLOGY REPORT*  Clinical Data:  Fall.  Head trauma.  CT HEAD WITHOUT CONTRAST CT CERVICAL SPINE WITHOUT CONTRAST  Technique:  Multidetector CT imaging of the head and cervical spine was performed following the standard protocol without intravenous contrast.  Multiplanar CT image reconstructions of the cervical spine were also generated.  Comparison:  06/25/2012.  CT HEAD  Findings: Calvarium intact.  Atrophy and chronic ischemic white matter disease is present.  There is a mass in the anterior right middle cranial fossa extending into the anterior cranial fossa. This measures similar to the prior exam of 2013, 32 mm AP and 32 mm transverse.  There is no hemorrhage.  Ventricular size and configuration appears unchanged compared to  prior.  No hydrocephalus.  No midline shift.  Minimal mass effect from the right middle cranial fossa lesion.  IMPRESSION: 1.  No acute intracranial abnormality. 2.  Atrophy and chronic ischemic white matter disease. 3.  Unchanged anterior right middle cranial fossa mass lesion. This may represent a meningioma given the low growth.  CT CERVICAL SPINE  Findings: There is a comminuted C2 fracture.  This is a combination of a type 3 odontoid fracture with extension into the lateral masses.  No epidural hematoma or compression of the cervical cord as visualized grossly.  The fracture is mildly impacted.  Tip of the odontoid is intact.  Craniocervical  junction appears within normal limits.  C1 ring is intact.  Fracture extends into both C2 lateral masses.  3 mm anterolisthesis of C2 on C3 may be traumatic or degenerative.  Severe bilateral facet arthrosis is present.  2 mm anterolisthesis of C3 on C4 appears degenerative, again associated with facet arthrosis.  Severe multilevel degenerative disc disease.  Disc osteophyte complexes extend from C4-C5 through C6-C7.  Ligamentum flavum and nuchal ligament calcification. Multilevel foraminal encroachment associated with uncovertebral spurring.  Central stenosis at C5-C6 there is moderate. Sternoclavicular joint degenerative disease.  No displaced rib fracture.  Lung apices appear within normal limits.  Carotid atherosclerosis incidentally noted.  IMPRESSION: 1.  Comminuted C2 fracture.  This is a combination of a type 3 odontoid fracture with extension of the fracture into both lateral masses.  3 mm anterolisthesis of C2 on C3 may be traumatic or degenerative.  No gross epidural hematoma identified on this noncontrast CT.  The fracture components are minimally displaced. 2.  Multilevel moderate to severe cervical spondylosis.   Original Report Authenticated By: Andreas Newport, M.D.     Scheduled Meds: . amLODipine  2.5 mg Oral Daily  . cefTRIAXone (ROCEPHIN)  IV  1 g  Intravenous Q24H  . cholecalciferol  2,000 Units Oral Daily  . enoxaparin (LOVENOX) injection  30 mg Subcutaneous Q24H  . levothyroxine  125 mcg Oral QAC breakfast  . loratadine  10 mg Oral Daily  . PARoxetine  20 mg Oral Daily  . phenytoin  200 mg Oral QHS  . potassium chloride  10 mEq Oral Daily  . sodium chloride  3 mL Intravenous Q12H  . sodium chloride  3 mL Intravenous Q12H   Continuous Infusions:   Active Problems:   UTI (urinary tract infection)   Hypertension   Hypothyroidism   Seizure disorder   Hyperlipidemia   Fall   Vertigo   C2 cervical fracture    Time spent:    [facility]  [facility] Pager [phone]. If 7PM-7AM, please contact night-coverage at [email or web address], password [facility] 02/06/2013, 12:07 PM  LOS: 2 days

## 2013-02-06 NOTE — Progress Notes (Signed)
Physical Therapy Treatment Patient Details Name: Erica [name] MRN: [id number] DOB: 10-14-1916 Today's Date: 02/06/2013 Time: 4098-1191 PT Time Calculation (min): 16 min  PT Assessment / Plan / Recommendation Comments on Treatment Session  Pt mobilty improving.      Follow Up Recommendations  SNF;Supervision/Assistance - 24 hour     Does the patient have the potential to tolerate intense rehabilitation     Barriers to Discharge        Equipment Recommendations  None recommended by PT    Recommendations for Other Services    Frequency Min 3X/week   Plan Discharge plan remains appropriate;Frequency needs to be updated    Precautions / Restrictions Precautions Precautions: Cervical;Fall Required Braces or Orthoses: Cervical Brace Cervical Brace: Hard collar;Applied in sitting position Restrictions Weight Bearing Restrictions: No   Pertinent Vitals/Pain 5/10 pain in neck.  RN notified.      Mobility  Bed Mobility Bed Mobility: Not assessed Details for Bed Mobility Assistance: Up in chair upon arrival Transfers Transfers: Sit to Stand;Stand to Sit Sit to Stand: 5: Supervision;With upper extremity assist;With armrests;From chair/3-in-1 Stand to Sit: 5: Supervision;With upper extremity assist;With armrests;To chair/3-in-1 Details for Transfer Assistance: Min VCs for hand placement for sit<>stand   Ambulation/Gait Ambulation/Gait Assistance: 5: Supervision;6: Modified independent (Device/Increase time) Ambulation Distance (Feet): 275 Feet Assistive device: Rolling walker Gait Pattern: Step-through pattern Gait velocity: slowed Stairs: No Wheelchair Mobility Wheelchair Mobility: No    Exercises     PT Diagnosis:    PT Problem List:   PT Treatment Interventions:     PT Goals Acute Rehab PT Goals PT Goal Formulation: With patient Time For Goal Achievement: 02/19/13 Potential to Achieve Goals: Good Pt will Roll Supine to Right Side: with supervision;with  rail Pt will Roll Supine to Left Side: with supervision;with rail Pt will go Supine/Side to Sit: with supervision;with rail Pt will go Sit to Stand: with supervision PT Goal: Sit to Stand - Progress: Met Pt will go Stand to Sit: with supervision PT Goal: Stand to Sit - Progress: Met Pt will Ambulate: >150 feet;with supervision;with rolling walker PT Goal: Ambulate - Progress: Met Additional Goals Additional Goal #1: Pt will demonstrate independent management of cervical collar.   PT Goal: Additional Goal #1 - Progress: Goal set today  Visit Information  Last PT Received On: 02/06/13 Assistance Needed: +1    Subjective Data  Subjective: Agreeable to amb; Enjoys being outside Patient Stated Goal: be able to return home   Cognition  Cognition Arousal/Alertness: Awake/alert Behavior During Therapy: WFL for tasks assessed/performed Overall Cognitive Status: Within Functional Limits for tasks assessed    Balance  Balance Balance Assessed: No Dynamic Standing Balance Dynamic Standing - Balance Support: No upper extremity supported;During functional activity Dynamic Standing - Level of Assistance:  (min guard A) Dynamic Standing - Comments: Washing hands at sink and managing clothing  End of Session PT - End of Session Equipment Utilized During Treatment: Gait belt;Cervical collar Activity Tolerance: Patient tolerated treatment well Patient left: in chair;with call bell/phone within reach Nurse Communication: Mobility status   GP     Erica [name] 02/06/2013, 4:19 PM Erica [name] L. Vilda Zollner DPT [phone]

## 2013-02-06 NOTE — Progress Notes (Signed)
Orthopedic Tech Progress Note Patient Details:  [name] 1917-07-28 [id number] Called bio-tech for c-collar. Patient ID: [name], female   DOB: 05-14-1917, 79 y.o.   MRN: [id number]   Jennye Moccasin 02/06/2013, 3:35 PM

## 2013-02-06 NOTE — Progress Notes (Signed)
I have reviewed this note and agree with its contents.  Cathy Terrea Bruster, OTR/L [phone] 02/06/2013   

## 2013-02-07 LAB — URINE CULTURE

## 2013-02-07 MED ORDER — VANCOMYCIN HCL IN DEXTROSE 750-5 MG/150ML-% IV SOLN
750.0000 mg | INTRAVENOUS | Status: DC
  Administered 2013-02-07: 750 mg via INTRAVENOUS
  Filled 2013-02-07: qty 150

## 2013-02-07 NOTE — Progress Notes (Signed)
[facility] PROGRESS NOTE  Erica [name] ZOX:[id number] DOB: 10-Dec-1916 DOA: 02/04/2013 PCP: Pamelia Hoit, MD  Assessment/Plan: Active Problems:   UTI (urinary tract infection)   Hypertension   Hypothyroidism   Seizure disorder   Hyperlipidemia   Fall   Vertigo   C2 cervical fracture   Brief narrative:  Erica [name] is an 79 y.o. female with hx of intermittent vertigo this past year, HTN, depression, Seizure, presented to the ER after a mechanical fall.  She was noted to have an acute C2 fracture for which NSG recommended a hard collar only and is also being treated for a UTI.   Assessment/Plan:  1. C2 fracture: -Following fall,  -NSG Dr.Jenkins reviewed case and Ct and recommended Hard cervical collar at all times and FU in his Office  -Pain controlled, Tylenol and tramadol PRN   2. UTI: -Urine Cx pending  -Continue rocephin and change to Po based on sensitivities    3. HTN:  -stable  -continue amlodipine   4. H/o Seizure d/o  -continue Dilantin   5. Hypothyroidism:  -continue synthroid  DVt proph: lovenox   Code Status: DNR  Family Communication: none at bedside, called and d/w Lum Keas her son in Social worker.  Disposition Plan: SNF;Supervision/Assistance - 24 hour     Consultants:  Dr.Jenkins reviewed case and Scans Antibiotics:  IV rocephin 4/28 HPI/Subjective:  Doing well, no complaints    Objective: Filed Vitals:   02/06/13 1800 02/06/13 2041 02/07/13 0512 02/07/13 0931  BP: 132/65 162/78 161/73 148/56  Pulse: 68 79 73 69  Temp: 98.6 F (37 C) 99.4 F (37.4 C) 97.6 F (36.4 C) 98.2 F (36.8 C)  TempSrc: Oral Oral Oral Oral  Resp: 18 18 18 18   Height:      Weight:  66.3 kg (146 lb 2.6 oz)    SpO2: 98% 94% 92% 95%    Intake/Output Summary (Last 24 hours) at 02/07/13 1301 Last data filed at 02/07/13 0915  Gross per 24 hour  Intake    340 ml  Output   1251 ml  Net   -911 ml    Exam:  General: AAOx3, no  distress  HEENT: PERRLA, EOMI, Wearing hard cervical collar  Cardiovascular: S1S2/RRR  Respiratory: CTAB  Abdomen: soft, NT, BS present  Musculoskeletal: no edema c/c  Neuro: no focal deficits   Data Reviewed: Basic Metabolic Panel:  Recent Labs Lab 02/04/13 2214 02/05/13 0700 02/06/13 0700  NA 140  --  139  K 3.6  --  3.7  CL 101  --  103  CO2 33*  --  29  GLUCOSE 129*  --  105*  BUN 20  --  17  CREATININE 0.72 0.55 0.48*  CALCIUM 9.1  --  8.7    Liver Function Tests: No results found for this basename: AST, ALT, ALKPHOS, BILITOT, PROT, ALBUMIN,  in the last 168 hours No results found for this basename: LIPASE, AMYLASE,  in the last 168 hours No results found for this basename: AMMONIA,  in the last 168 hours  CBC:  Recent Labs Lab 02/04/13 2214 02/05/13 0700 02/06/13 0700  WBC 4.9 6.2 6.0  NEUTROABS 2.7  --   --   HGB 13.5 13.1 13.0  HCT 39.2 38.2 37.6  MCV 91.0 91.0 91.5  PLT 150 151 152    Cardiac Enzymes:  Recent Labs Lab 02/04/13 2214  TROPONINI <0.30   BNP (last 3 results) No results found for this basename: PROBNP,  in  the last 8760 hours   CBG: No results found for this basename: GLUCAP,  in the last 168 hours  Recent Results (from the past 240 hour(s))  URINE CULTURE     Status: None   Collection Time    02/04/13 11:30 PM      Result Value Range Status   Specimen Description URINE, RANDOM   Final   Special Requests CX ADDED AT 2357 ON [id number]   Final   Culture  Setup Time 02/05/2013 02:01   Final   Colony Count >=100,000 COLONIES/ML   Final   Culture     Final   Value: STAPHYLOCOCCUS SPECIES     Note: RIFAMPIN AND GENTAMICIN SHOULD NOT BE USED AS SINGLE DRUGS FOR TREATMENT OF STAPH INFECTIONS.   Report Status PENDING   Incomplete     Studies: Dg Chest 1 View  02/04/2013  *RADIOLOGY REPORT*  Clinical Data: Mid and low back tenderness after fall tonight.  CHEST - 1 VIEW  Comparison: 03/30/2012  Findings: Mild cardiac enlargement.   Pulmonary vascularity is normal for technique.  Calcified and tortuous aorta.  Peribronchial thickening interstitial changes consistent with chronic bronchitis. No focal consolidation.  No blunting of costophrenic angles.  No pneumothorax.  Degenerative changes in the spine and shoulders.  No significant change since previous study.  IMPRESSION: No evidence of active pulmonary disease.  Mild cardiac enlargement and chronic bronchitic changes.   Original Report Authenticated By: Burman Nieves, M.D.    Dg Thoracic Spine 2 View  02/04/2013  *RADIOLOGY REPORT*  Clinical Data: The patient fell backwards tonight and complains of tenderness in the mid and lower back.  THORACIC SPINE - 2 VIEW  Comparison: Chest and lateral 09/29/2009.  Findings: The normal alignment of the thoracic vertebrae.  Diffuse degenerative changes with narrowed thoracic interspaces and associated endplate hypertrophic changes.  Mild anterior compression of the superior endplate of T12 is stable since previous chest radiograph.  No acute compression deformities are demonstrated.  No focal bone lesion or bone destruction.  No paraspinal soft tissue swelling.  IMPRESSION: Diffuse degenerative changes in the thoracic spine.  Stable mild compression of T12.  No acute displaced fractures are demonstrated.   Original Report Authenticated By: Burman Nieves, M.D.    Dg Lumbar Spine 2-3 Views  02/04/2013  *RADIOLOGY REPORT*  Clinical Data: Mid and low back tenderness after fall tonight.  LUMBAR SPINE - 2-3 VIEW  Comparison: CT myelogram 09/26/2003  Findings: Five lumbar type vertebrae.  Diffuse bone demineralization.  Diffuse degenerative change throughout the lumbar spine with narrowed cervical interspaces and prominent hypertrophic changes in the endplates.  Degenerative changes in the facet joints.  Mild lumbar scoliosis convex towards the right is likely degenerative.  There is superior endplate compression of the T12 vertebra which appears  stable since previous chest radiograph from 09/29/2009. Mild retrolisthesis of L2-L3 and mild anterolisthesis of L4-L5. These changes are stable and likely due to degenerative change.  No acute vertebral compression deformities.  No focal bone lesion or bone destruction.  Vascular calcifications.  IMPRESSION: The degenerative changes throughout the lumbar spine with diffuse demineralization.  Chronic compression of the anterior T12 vertebral body.  No acute displaced fractures are identified.   Original Report Authenticated By: Burman Nieves, M.D.    Ct Head Wo Contrast  02/04/2013  *RADIOLOGY REPORT*  Clinical Data:  Fall.  Head trauma.  CT HEAD WITHOUT CONTRAST CT CERVICAL SPINE WITHOUT CONTRAST  Technique:  Multidetector CT imaging of the head and cervical  spine was performed following the standard protocol without intravenous contrast.  Multiplanar CT image reconstructions of the cervical spine were also generated.  Comparison:  06/25/2012.  CT HEAD  Findings: Calvarium intact.  Atrophy and chronic ischemic white matter disease is present.  There is a mass in the anterior right middle cranial fossa extending into the anterior cranial fossa. This measures similar to the prior exam of 2013, 32 mm AP and 32 mm transverse.  There is no hemorrhage.  Ventricular size and configuration appears unchanged compared to prior.  No hydrocephalus.  No midline shift.  Minimal mass effect from the right middle cranial fossa lesion.  IMPRESSION: 1.  No acute intracranial abnormality. 2.  Atrophy and chronic ischemic white matter disease. 3.  Unchanged anterior right middle cranial fossa mass lesion. This may represent a meningioma given the low growth.  CT CERVICAL SPINE  Findings: There is a comminuted C2 fracture.  This is a combination of a type 3 odontoid fracture with extension into the lateral masses.  No epidural hematoma or compression of the cervical cord as visualized grossly.  The fracture is mildly impacted.   Tip of the odontoid is intact.  Craniocervical junction appears within normal limits.  C1 ring is intact.  Fracture extends into both C2 lateral masses.  3 mm anterolisthesis of C2 on C3 may be traumatic or degenerative.  Severe bilateral facet arthrosis is present.  2 mm anterolisthesis of C3 on C4 appears degenerative, again associated with facet arthrosis.  Severe multilevel degenerative disc disease.  Disc osteophyte complexes extend from C4-C5 through C6-C7.  Ligamentum flavum and nuchal ligament calcification. Multilevel foraminal encroachment associated with uncovertebral spurring.  Central stenosis at C5-C6 there is moderate. Sternoclavicular joint degenerative disease.  No displaced rib fracture.  Lung apices appear within normal limits.  Carotid atherosclerosis incidentally noted.  IMPRESSION: 1.  Comminuted C2 fracture.  This is a combination of a type 3 odontoid fracture with extension of the fracture into both lateral masses.  3 mm anterolisthesis of C2 on C3 may be traumatic or degenerative.  No gross epidural hematoma identified on this noncontrast CT.  The fracture components are minimally displaced. 2.  Multilevel moderate to severe cervical spondylosis.   Original Report Authenticated By: Andreas Newport, M.D.    Ct Cervical Spine Wo Contrast  02/04/2013  *RADIOLOGY REPORT*  Clinical Data:  Fall.  Head trauma.  CT HEAD WITHOUT CONTRAST CT CERVICAL SPINE WITHOUT CONTRAST  Technique:  Multidetector CT imaging of the head and cervical spine was performed following the standard protocol without intravenous contrast.  Multiplanar CT image reconstructions of the cervical spine were also generated.  Comparison:  06/25/2012.  CT HEAD  Findings: Calvarium intact.  Atrophy and chronic ischemic white matter disease is present.  There is a mass in the anterior right middle cranial fossa extending into the anterior cranial fossa. This measures similar to the prior exam of 2013, 32 mm AP and 32 mm transverse.   There is no hemorrhage.  Ventricular size and configuration appears unchanged compared to prior.  No hydrocephalus.  No midline shift.  Minimal mass effect from the right middle cranial fossa lesion.  IMPRESSION: 1.  No acute intracranial abnormality. 2.  Atrophy and chronic ischemic white matter disease. 3.  Unchanged anterior right middle cranial fossa mass lesion. This may represent a meningioma given the low growth.  CT CERVICAL SPINE  Findings: There is a comminuted C2 fracture.  This is a combination of a type 3 odontoid  fracture with extension into the lateral masses.  No epidural hematoma or compression of the cervical cord as visualized grossly.  The fracture is mildly impacted.  Tip of the odontoid is intact.  Craniocervical junction appears within normal limits.  C1 ring is intact.  Fracture extends into both C2 lateral masses.  3 mm anterolisthesis of C2 on C3 may be traumatic or degenerative.  Severe bilateral facet arthrosis is present.  2 mm anterolisthesis of C3 on C4 appears degenerative, again associated with facet arthrosis.  Severe multilevel degenerative disc disease.  Disc osteophyte complexes extend from C4-C5 through C6-C7.  Ligamentum flavum and nuchal ligament calcification. Multilevel foraminal encroachment associated with uncovertebral spurring.  Central stenosis at C5-C6 there is moderate. Sternoclavicular joint degenerative disease.  No displaced rib fracture.  Lung apices appear within normal limits.  Carotid atherosclerosis incidentally noted.  IMPRESSION: 1.  Comminuted C2 fracture.  This is a combination of a type 3 odontoid fracture with extension of the fracture into both lateral masses.  3 mm anterolisthesis of C2 on C3 may be traumatic or degenerative.  No gross epidural hematoma identified on this noncontrast CT.  The fracture components are minimally displaced. 2.  Multilevel moderate to severe cervical spondylosis.   Original Report Authenticated By: Andreas Newport, M.D.      Scheduled Meds: . amLODipine  2.5 mg Oral Daily  . cefTRIAXone (ROCEPHIN)  IV  1 g Intravenous Q24H  . cholecalciferol  2,000 Units Oral Daily  . enoxaparin (LOVENOX) injection  30 mg Subcutaneous Q24H  . levothyroxine  125 mcg Oral QAC breakfast  . loratadine  10 mg Oral Daily  . PARoxetine  20 mg Oral Daily  . phenytoin  200 mg Oral QHS  . potassium chloride  10 mEq Oral Daily  . sodium chloride  3 mL Intravenous Q12H  . sodium chloride  3 mL Intravenous Q12H   Continuous Infusions:   Active Problems:   UTI (urinary tract infection)   Hypertension   Hypothyroidism   Seizure disorder   Hyperlipidemia   Fall   Vertigo   C2 cervical fracture    Time spent: 40 minutes   [facility]  [facility] Pager [phone]. If 8PM-8AM, please contact night-coverage at [email or web address], password [facility] 02/07/2013, 1:01 PM  LOS: 3 days

## 2013-02-07 NOTE — Significant Event (Signed)
CRITICAL VALUE ALERT  Critical value received:  Urine culture positive for MRSA  Date of notification:  02/07/13  Time of notification:  1550  Critical value read back:yes  Nurse who received alert:  De Nurse  MD notified (1st page):  Dr. Susie Cassette  Time of first page:  1555  MD notified (2nd page):  Time of second page:  Responding MD:  Dr. Susie Cassette  Time MD responded:  1600  Laverda Sorenson, RN

## 2013-02-07 NOTE — Progress Notes (Signed)
ANTIBIOTIC CONSULT NOTE - INITIAL  Pharmacy Consult for Vancomycin Indication: MRSA UTI  Allergies  Allergen Reactions  . Clarithromycin Other (See Comments)    "made me ill"-reaction years ago  . Ditropan (Oxybutynin Chloride) Itching and Other (See Comments)    Constipation  . Prednisone Other (See Comments)    Stomach pain & dizziness  . Vioxx (Rofecoxib) Other (See Comments)    dizziness    Patient Measurements: Height: 5\' 6"  (167.6 cm) Weight: 146 lb 2.6 oz (66.3 kg) IBW/kg (Calculated) : 59.3  Labs:  Recent Labs  02/04/13 2214 02/05/13 0700 02/06/13 0700  WBC 4.9 6.2 6.0  HGB 13.5 13.1 13.0  PLT 150 151 152  CREATININE 0.72 0.55 0.48*   Estimated Creatinine Clearance: 41.1 ml/min (by C-G formula based on Cr of 0.48). No results found for this basename: VANCOTROUGH, Leodis Binet, VANCORANDOM, GENTTROUGH, GENTPEAK, GENTRANDOM, TOBRATROUGH, TOBRAPEAK, TOBRARND, AMIKACINPEAK, AMIKACINTROU, AMIKACIN,  in the last 72 hours   Microbiology: Recent Results (from the past 720 hour(s))  URINE CULTURE     Status: None   Collection Time    02/04/13 11:30 PM      Result Value Range Status   Specimen Description URINE, RANDOM   Final   Special Requests CX ADDED AT 2357 ON [id number]   Final   Culture  Setup Time 02/05/2013 02:01   Final   Colony Count >=100,000 COLONIES/ML   Final   Culture     Final   Value: METHICILLIN RESISTANT STAPHYLOCOCCUS AUREUS     Note: RIFAMPIN AND GENTAMICIN SHOULD NOT BE USED AS SINGLE DRUGS FOR TREATMENT OF STAPH INFECTIONS. CRITICAL RESULT CALLED TO, READ BACK BY AND VERIFIED WITH: TY HICKS@1550  ON [id number] BY [facility]   Report Status 02/07/2013 FINAL   Final   Organism ID, Bacteria METHICILLIN RESISTANT STAPHYLOCOCCUS AUREUS   Final    Medical History: Past Medical History  Diagnosis Date  . Hypertension   . Hemorrhoids   . Thyroid disease   . Osteoarthritis   . Seizures   . Depression   . Hyperlipidemia   . Wears glasses   . Cancer    skin  . Bruises easily   . Skin cancer    Assessment: 79 year old female admitted s/p fall with a C2 fracture and UTI.  Urine culture positive for MRSA, beginning vancomycin therapy.  Scr = 0.48, CrCl approximately 35 to 40 ml/ min  Goal of Therapy:  Vancomycin trough level 10-15 mcg/ml  Plan:  1) Vancomycin 750 mg iv Q 24 hours 2) Follow up progress, Scr 3) Trough as needed  Thank you. Okey Regal, PharmD [phone]  02/07/2013,4:10 PM

## 2013-02-07 NOTE — Clinical Social Work Psychosocial (Signed)
Clinical Social Work Department BRIEF PSYCHOSOCIAL ASSESSMENT 02/07/2013  Patient:  Erica [name], LAHMAN     Account Number:  [id number]     Admit date:  02/04/2013  Clinical Social Worker:  Delmer Islam  Date/Time:  02/07/2013 05:53 AM  Referred by:  Physician  Date Referred:  02/06/2013 Referred for  SNF Placement   Other Referral:   Interview type:  Patient Other interview type:    PSYCHOSOCIAL DATA Living Status:  ALONE Admitted from facility:   Level of care:   Primary support name:  Lum Keas Primary support relationship to patient:  FAMILY Degree of support available:   Mr. Idell Pickles is her son-in-law. Per patient he checks on her and helps her out as needed. Ms. Clendenning stated that her daughter (who was married to Mr. Idell Pickles) is deceased. She also has a son that is deceased. Patient has 2 sons that live in [place] and [place].    CURRENT CONCERNS Current Concerns  Post-Acute Placement   Other Concerns:    SOCIAL WORK ASSESSMENT / PLAN CSW introduced self and explained role. Patient is in agreement with going to a SNF for short-term rehab and wants to return to [facility], as she has been there before and likes the care she rec'd there. Patient and CSW talked about her family supports and her son-in-law is the closes to her, living next door. Per patient her daughter died last year.    CSW talked with patient about selecting a second choice in case [facility] does not have a bed available and patient expressed understanding.   Assessment/plan status:  Psychosocial Support/Ongoing Assessment of Needs Other assessment/ plan:   Information/referral to community resources:   Patient given SNF list for [facility]    PATIENT'S/FAMILY'S RESPONSE TO PLAN OF CARE: Patient very pleasant and in agreement with ST rehab at a SNF. CSW advised patient that she will be advised as to whether [facility] accept her. Patient expressed appreciation for CSW's  assistance.

## 2013-02-07 NOTE — Progress Notes (Signed)
Occupational Therapy Treatment Patient Details Name: Erica [name] MRN: [id number] DOB: 07-26-1917 Today's Date: 02/07/2013 Time: 4098-1191 OT Time Calculation (min): 32 min  OT Assessment / Plan / Recommendation Comments on Treatment Session Pt is progressing.  Pt received new collar and had to have it adjusted by OTR/L and later on in the session with OTAS.  Pt was taught compensatory techniques to donn and doff socks.  Was unable to donn right sock pt stating the right leg is weaker.      Follow Up Recommendations  SNF       Equipment Recommendations  None recommended by OT       Frequency Min 2X/week   Plan Discharge plan remains appropriate    Precautions / Restrictions Precautions Precautions: Cervical;Fall Required Braces or Orthoses: Cervical Brace Cervical Brace: Hard collar;Applied in sitting position Restrictions Weight Bearing Restrictions: No   Pertinent Vitals/Pain Pt complained of neck pain; RN came into room after tx session    ADL  Grooming: Performed;Wash/dry hands;Supervision/safety;Set up Where Assessed - Grooming: Unsupported standing Lower Body Bathing: Minimal assistance;Performed Where Assessed - Lower Body Bathing: Supported sitting (Sat on toliet to wipe anterior peri area after accident) Lower Body Dressing: Performed;Minimal assistance Where Assessed - Lower Body Dressing: Supported sit to stand Toilet Transfer: Performed;Min Pension scheme manager Method: Sit to Barista: Raised toilet seat with arms (or 3-in-1 over toilet);Grab bars Toileting - Clothing Manipulation and Hygiene: Performed;Min guard Where Assessed - Engineer, mining and Hygiene: Standing Equipment Used: Gait belt;Rolling walker Transfers/Ambulation Related to ADLs: Pt ambulated from bed>bathroom>sink>recliner with min guard A. Needed VCs for hand placement with sit<>stand at bed and toliet.  Once ambulated to recliner pt did correct  hand palcement ADL Comments: Pt was unaware that her gown was wet in supine.  Once got to bathroom and sat on 3in1 pt was able to wash her anterior peri area while sitting.  Pt was able to doff socks and only able to donn the right sock by crossing the right leg over, but needed min A with donning the left sock by placing the sock over the toes then pt donning the sock on the rest of the way     OT Goals ADL Goals ADL Goal: Lower Body Bathing - Progress: Progressing toward goals ADL Goal: Lower Body Dressing - Progress: Progressing toward goals ADL Goal: Toilet Transfer - Progress: Not progressing (Same as last tx session) ADL Goal: Toileting - Clothing Manipulation - Progress: Not progressing (same as last tx session) ADL Goal: Toileting - Hygiene - Progress: Not progressing (Same as last tx session) Miscellaneous OT Goals OT Goal: Miscellaneous Goal #1 - Progress: Progressing toward goals  Visit Information  Last OT Received On: 02/07/13 Assistance Needed: +1    Subjective Data  Subjective: I can't control my bowels(when realized her gown was wet)      Cognition  Cognition Arousal/Alertness: Awake/alert Behavior During Therapy: WFL for tasks assessed/performed Overall Cognitive Status: Within Functional Limits for tasks assessed    Mobility  Bed Mobility Bed Mobility: Supine to Sit;Sitting - Scoot to Edge of Bed Supine to Sit: HOB elevated;With rails;4: Min assist Sitting - Scoot to Edge of Bed: 5: Supervision (safety) Details for Bed Mobility Assistance: Pt needed most A with lifting UB off of bed Transfers Transfers: Sit to Stand;Stand to Sit Sit to Stand: 5: Supervision;With upper extremity assist;With armrests;From bed;From toilet;From chair/3-in-1 Stand to Sit: 5: Supervision;With upper extremity assist;With armrests;To chair/3-in-1;To toilet Details for Transfer  Assistance: Min VCs for hand placement for sit<>stand from bed from/to toliet       Balance  Balance Balance Assessed: Yes Dynamic Sitting Balance Dynamic Sitting - Balance Support: During functional activity;Feet supported Dynamic Sitting - Level of Assistance:  (supervision/safety) Dynamic Sitting - Comments: donning and doffing sock Dynamic Standing Balance Dynamic Standing - Balance Support: No upper extremity supported;During functional activity Dynamic Standing - Comments: Washing hands at sink   End of Session OT - End of Session Equipment Utilized During Treatment: Gait belt;Cervical collar Activity Tolerance: Patient tolerated treatment well Patient left: in chair;with call bell/phone within reach       Mayford Knife, [place] 02/07/2013, 1:46 PM

## 2013-02-07 NOTE — Clinical Social Work Placement (Signed)
Clinical Social Work Department CLINICAL SOCIAL WORK PLACEMENT NOTE 02/07/2013  Patient:  Erica [name], Erica [name]  Account Number:  [id number] Admit date:  02/04/2013  Clinical Social Worker:  Genelle Bal, LCSW  Date/time:  02/07/2013 06:02 AM  Clinical Social Work is seeking post-discharge placement for this patient at the following level of care:   SKILLED NURSING   (*CSW will update this form in Epic as items are completed)   02/07/2013  Patient/family provided with [facility] Department of Clinical Social Work's list of facilities offering this level of care within the geographic area requested by the patient (or if unable, by the patient's family).  02/07/2013  Patient/family informed of their freedom to choose among providers that offer the needed level of care, that participate in Medicare, Medicaid or managed care program needed by the patient, have an available bed and are willing to accept the patient.  02/07/2013  Patient/family informed of [facility]' ownership interest in [facility], as well as of the fact that they are under no obligation to receive care at this facility.  PASARR submitted to EDS on  PASARR number received from EDS on - Patient has a PASARR number   FL2 transmitted to all facilities in geographic area requested by pt/family on  02/07/2013 FL2 transmitted to all facilities within larger geographic area on   Patient informed that his/her managed care company has contracts with or will negotiate with  certain facilities, including the following:     Patient/family informed of bed offers received:   Patient chooses bed at  Physician recommends and patient chooses bed at    Patient to be transferred to  on   Patient to be transferred to facility by   The following physician request were entered in Epic:   Additional Comments:

## 2013-02-07 NOTE — Progress Notes (Signed)
I have reviewed and agree with this note.  Cathy Marlea Gambill, OTR/L [phone] 02/07/2013   

## 2013-02-08 MED ORDER — SULFAMETHOXAZOLE-TMP DS 800-160 MG PO TABS: 1.0000 | ORAL_TABLET | Freq: Two times a day (BID) | ORAL | Status: DC

## 2013-02-08 MED ORDER — SULFAMETHOXAZOLE-TMP DS 800-160 MG PO TABS
1.0000 | ORAL_TABLET | Freq: Two times a day (BID) | ORAL | Status: DC
  Administered 2013-02-08 – 2013-02-09 (×3): 1 via ORAL
  Filled 2013-02-08 (×4): qty 1
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
## 2013-02-08 NOTE — Progress Notes (Signed)
Physical Therapy Treatment Patient Details Name: Erica [name] MRN: [id number] DOB: 1916/12/31 Today's Date: 02/08/2013 Time: 1442-1500 PT Time Calculation (min): 18 min  PT Assessment / Plan / Recommendation Comments on Treatment Session  adjusted pt's cervical collar for pt's comfort. Pt mobility much improved and pt is functionally appropriate to return to home with support of family.      Follow Up Recommendations  Home health PT;Supervision - Intermittent     Does the patient have the potential to tolerate intense rehabilitation     Barriers to Discharge        Equipment Recommendations  None recommended by PT    Recommendations for Other Services    Frequency Min 3X/week   Plan Discharge plan needs to be updated;Frequency needs to be updated    Precautions / Restrictions Precautions Precautions: Cervical;Fall Required Braces or Orthoses: Cervical Brace Cervical Brace: Hard collar;Applied in sitting position Restrictions Weight Bearing Restrictions: No   Pertinent Vitals/Pain No c/o pain    Mobility  Bed Mobility Bed Mobility: Not assessed Transfers Transfers: Sit to Stand;Stand to Sit Sit to Stand: 5: Supervision Stand to Sit: 5: Supervision Details for Transfer Assistance: Min VCs for hand placement for sit<>stand from bed from/to toliet Ambulation/Gait Ambulation/Gait Assistance: 5: Supervision;6: Modified independent (Device/Increase time) Ambulation Distance (Feet): 150 Feet Assistive device: Rolling walker Ambulation/Gait Assistance Details: Supervision for safety.  No assistance required.  Gait Pattern: Within Functional Limits Stairs: No    Exercises     PT Diagnosis:    PT Problem List:   PT Treatment Interventions:     PT Goals Acute Rehab PT Goals PT Goal Formulation: With patient Time For Goal Achievement: 02/19/13 Potential to Achieve Goals: Good Pt will Roll Supine to Right Side: with supervision;with rail Pt will Roll Supine  to Left Side: with supervision;with rail Pt will go Supine/Side to Sit: with supervision;with rail Pt will go Sit to Supine/Side: with supervision;with rail Pt will go Sit to Stand: with supervision PT Goal: Sit to Stand - Progress: Met Pt will go Stand to Sit: with supervision PT Goal: Stand to Sit - Progress: Met Pt will Ambulate: >150 feet;with supervision;with rolling walker PT Goal: Ambulate - Progress: Met Additional Goals Additional Goal #1: Pt will demonstrate independent management of cervical collar.   PT Goal: Additional Goal #1 - Progress: Progressing toward goal  Visit Information  Last PT Received On: 02/08/13 Assistance Needed: +1    Subjective Data  Subjective: Agreeable to amb; Enjoys being outside Patient Stated Goal: be able to return home   Cognition  Cognition Arousal/Alertness: Awake/alert Behavior During Therapy: WFL for tasks assessed/performed Overall Cognitive Status: Within Functional Limits for tasks assessed    Balance  Balance Balance Assessed: No  End of Session PT - End of Session Equipment Utilized During Treatment: Gait belt;Cervical collar Activity Tolerance: Patient tolerated treatment well Patient left: in chair;with call bell/phone within reach Nurse Communication: Mobility status   GP     Evelina Lore 02/08/2013, 4:28 PM Markeeta Scalf L. Lisle Skillman DPT [phone]

## 2013-02-08 NOTE — Discharge Summary (Addendum)
Physician Discharge Summary  Erica [name] MRN: [id number] DOB/AGE: 1917/09/14 79 y.o.  PCP: Pamelia Hoit, MD   Admit date: 02/04/2013 Discharge date: 02/08/2013  Discharge Diagnoses:  MRSA UTI   Hypertension   Hypothyroidism   Seizure disorder   Hyperlipidemia   Fall   Vertigo   C2 cervical fracture     Medication List    TAKE these medications       acetaminophen 325 MG tablet  Commonly known as:  TYLENOL  Take 2 tablets (650 mg total) by mouth every 6 (six) hours as needed.     amLODipine 2.5 MG tablet  Commonly known as:  NORVASC  Take 2.5 mg by mouth daily.     B-complex with vitamin C tablet  Take 1 tablet by mouth daily. OTC  [facility] brand     fexofenadine 180 MG tablet  Commonly known as:  ALLEGRA  Take 180 mg by mouth daily.     furosemide 20 MG tablet  Commonly known as:  LASIX  Take 20 mg by mouth daily.     levothyroxine 125 MCG tablet  Commonly known as:  SYNTHROID, LEVOTHROID  Take 125 mcg by mouth daily before breakfast.     multivitamin with minerals Tabs  Take 1 tablet by mouth daily.     PARoxetine 20 MG tablet  Commonly known as:  PAXIL  Take 20 mg by mouth daily.     phenytoin 100 MG ER capsule  Commonly known as:  DILANTIN  Take 200 mg by mouth at bedtime.     potassium chloride 10 MEQ tablet  Commonly known as:  K-DUR,KLOR-CON  Take 10 mEq by mouth daily.     sulfamethoxazole-trimethoprim 800-160 MG per tablet  Commonly known as:  BACTRIM DS  Take 1 tablet by mouth every 12 (twelve) hours.for 2 weeks     traMADol 50 MG tablet  Commonly known as:  ULTRAM  Take 1 tablet (50 mg total) by mouth every 8 (eight) hours as needed for pain.     Vitamin D3 2000 UNITS Tabs  Take 1 tablet by mouth daily.        Discharge Condition:    Disposition: SNF    Consults: * Neurosurgery   Significant Diagnostic Studies: Dg Chest 1 View  02/04/2013  *RADIOLOGY REPORT*  Clinical Data: Mid and low back tenderness  after fall tonight.  CHEST - 1 VIEW  Comparison: 03/30/2012  Findings: Mild cardiac enlargement.  Pulmonary vascularity is normal for technique.  Calcified and tortuous aorta.  Peribronchial thickening interstitial changes consistent with chronic bronchitis. No focal consolidation.  No blunting of costophrenic angles.  No pneumothorax.  Degenerative changes in the spine and shoulders.  No significant change since previous study.  IMPRESSION: No evidence of active pulmonary disease.  Mild cardiac enlargement and chronic bronchitic changes.   Original Report Authenticated By: Burman Nieves, M.D.    Dg Thoracic Spine 2 View  02/04/2013  *RADIOLOGY REPORT*  Clinical Data: The patient fell backwards tonight and complains of tenderness in the mid and lower back.  THORACIC SPINE - 2 VIEW  Comparison: Chest and lateral 09/29/2009.  Findings: The normal alignment of the thoracic vertebrae.  Diffuse degenerative changes with narrowed thoracic interspaces and associated endplate hypertrophic changes.  Mild anterior compression of the superior endplate of T12 is stable since previous chest radiograph.  No acute compression deformities are demonstrated.  No focal bone lesion or bone destruction.  No paraspinal soft tissue swelling.  IMPRESSION:  Diffuse degenerative changes in the thoracic spine.  Stable mild compression of T12.  No acute displaced fractures are demonstrated.   Original Report Authenticated By: Burman Nieves, M.D.    Dg Lumbar Spine 2-3 Views  02/04/2013  *RADIOLOGY REPORT*  Clinical Data: Mid and low back tenderness after fall tonight.  LUMBAR SPINE - 2-3 VIEW  Comparison: CT myelogram 09/26/2003  Findings: Five lumbar type vertebrae.  Diffuse bone demineralization.  Diffuse degenerative change throughout the lumbar spine with narrowed cervical interspaces and prominent hypertrophic changes in the endplates.  Degenerative changes in the facet joints.  Mild lumbar scoliosis convex towards the right is  likely degenerative.  There is superior endplate compression of the T12 vertebra which appears stable since previous chest radiograph from 09/29/2009. Mild retrolisthesis of L2-L3 and mild anterolisthesis of L4-L5. These changes are stable and likely due to degenerative change.  No acute vertebral compression deformities.  No focal bone lesion or bone destruction.  Vascular calcifications.  IMPRESSION: The degenerative changes throughout the lumbar spine with diffuse demineralization.  Chronic compression of the anterior T12 vertebral body.  No acute displaced fractures are identified.   Original Report Authenticated By: Burman Nieves, M.D.    Ct Head Wo Contrast  02/04/2013  *RADIOLOGY REPORT*  Clinical Data:  Fall.  Head trauma.  CT HEAD WITHOUT CONTRAST CT CERVICAL SPINE WITHOUT CONTRAST  Technique:  Multidetector CT imaging of the head and cervical spine was performed following the standard protocol without intravenous contrast.  Multiplanar CT image reconstructions of the cervical spine were also generated.  Comparison:  06/25/2012.  CT HEAD  Findings: Calvarium intact.  Atrophy and chronic ischemic white matter disease is present.  There is a mass in the anterior right middle cranial fossa extending into the anterior cranial fossa. This measures similar to the prior exam of 2013, 32 mm AP and 32 mm transverse.  There is no hemorrhage.  Ventricular size and configuration appears unchanged compared to prior.  No hydrocephalus.  No midline shift.  Minimal mass effect from the right middle cranial fossa lesion.  IMPRESSION: 1.  No acute intracranial abnormality. 2.  Atrophy and chronic ischemic white matter disease. 3.  Unchanged anterior right middle cranial fossa mass lesion. This may represent a meningioma given the low growth.  CT CERVICAL SPINE  Findings: There is a comminuted C2 fracture.  This is a combination of a type 3 odontoid fracture with extension into the lateral masses.  No epidural hematoma  or compression of the cervical cord as visualized grossly.  The fracture is mildly impacted.  Tip of the odontoid is intact.  Craniocervical junction appears within normal limits.  C1 ring is intact.  Fracture extends into both C2 lateral masses.  3 mm anterolisthesis of C2 on C3 may be traumatic or degenerative.  Severe bilateral facet arthrosis is present.  2 mm anterolisthesis of C3 on C4 appears degenerative, again associated with facet arthrosis.  Severe multilevel degenerative disc disease.  Disc osteophyte complexes extend from C4-C5 through C6-C7.  Ligamentum flavum and nuchal ligament calcification. Multilevel foraminal encroachment associated with uncovertebral spurring.  Central stenosis at C5-C6 there is moderate. Sternoclavicular joint degenerative disease.  No displaced rib fracture.  Lung apices appear within normal limits.  Carotid atherosclerosis incidentally noted.  IMPRESSION: 1.  Comminuted C2 fracture.  This is a combination of a type 3 odontoid fracture with extension of the fracture into both lateral masses.  3 mm anterolisthesis of C2 on C3 may be traumatic or degenerative.  No gross epidural hematoma identified on this noncontrast CT.  The fracture components are minimally displaced. 2.  Multilevel moderate to severe cervical spondylosis.   Original Report Authenticated By: Andreas Newport, M.D.    Ct Cervical Spine Wo Contrast  02/04/2013  *RADIOLOGY REPORT*  Clinical Data:  Fall.  Head trauma.  CT HEAD WITHOUT CONTRAST CT CERVICAL SPINE WITHOUT CONTRAST  Technique:  Multidetector CT imaging of the head and cervical spine was performed following the standard protocol without intravenous contrast.  Multiplanar CT image reconstructions of the cervical spine were also generated.  Comparison:  06/25/2012.  CT HEAD  Findings: Calvarium intact.  Atrophy and chronic ischemic white matter disease is present.  There is a mass in the anterior right middle cranial fossa extending into the anterior  cranial fossa. This measures similar to the prior exam of 2013, 32 mm AP and 32 mm transverse.  There is no hemorrhage.  Ventricular size and configuration appears unchanged compared to prior.  No hydrocephalus.  No midline shift.  Minimal mass effect from the right middle cranial fossa lesion.  IMPRESSION: 1.  No acute intracranial abnormality. 2.  Atrophy and chronic ischemic white matter disease. 3.  Unchanged anterior right middle cranial fossa mass lesion. This may represent a meningioma given the low growth.  CT CERVICAL SPINE  Findings: There is a comminuted C2 fracture.  This is a combination of a type 3 odontoid fracture with extension into the lateral masses.  No epidural hematoma or compression of the cervical cord as visualized grossly.  The fracture is mildly impacted.  Tip of the odontoid is intact.  Craniocervical junction appears within normal limits.  C1 ring is intact.  Fracture extends into both C2 lateral masses.  3 mm anterolisthesis of C2 on C3 may be traumatic or degenerative.  Severe bilateral facet arthrosis is present.  2 mm anterolisthesis of C3 on C4 appears degenerative, again associated with facet arthrosis.  Severe multilevel degenerative disc disease.  Disc osteophyte complexes extend from C4-C5 through C6-C7.  Ligamentum flavum and nuchal ligament calcification. Multilevel foraminal encroachment associated with uncovertebral spurring.  Central stenosis at C5-C6 there is moderate. Sternoclavicular joint degenerative disease.  No displaced rib fracture.  Lung apices appear within normal limits.  Carotid atherosclerosis incidentally noted.  IMPRESSION: 1.  Comminuted C2 fracture.  This is a combination of a type 3 odontoid fracture with extension of the fracture into both lateral masses.  3 mm anterolisthesis of C2 on C3 may be traumatic or degenerative.  No gross epidural hematoma identified on this noncontrast CT.  The fracture components are minimally displaced. 2.  Multilevel  moderate to severe cervical spondylosis.   Original Report Authenticated By: Andreas Newport, M.D.       Microbiology: Recent Results (from the past 240 hour(s))  URINE CULTURE     Status: None   Collection Time    02/04/13 11:30 PM      Result Value Range Status   Specimen Description URINE, RANDOM   Final   Special Requests CX ADDED AT 2357 ON [id number]   Final   Culture  Setup Time 02/05/2013 02:01   Final   Colony Count >=100,000 COLONIES/ML   Final   Culture     Final   Value: METHICILLIN RESISTANT STAPHYLOCOCCUS AUREUS     Note: RIFAMPIN AND GENTAMICIN SHOULD NOT BE USED AS SINGLE DRUGS FOR TREATMENT OF STAPH INFECTIONS. CRITICAL RESULT CALLED TO, READ BACK BY AND VERIFIED WITH: TY HICKS@1550  ON [id number] BY [facility]  Report Status 02/07/2013 FINAL   Final   Organism ID, Bacteria METHICILLIN RESISTANT STAPHYLOCOCCUS AUREUS   Final     Labs: No results found for this or any previous visit (from the past 48 hour(s)).   HPI  79 y.o. female with hx of intermittent vertigo this past year, HTN, depression, Seizure, presents to the ER after falling. She denied LOC, HA, chest pain or palpitation. Evaluation in the ER with negative head CT ( a stable ?meningioma), Neck CT with C2 diminuted Fx, and Lumbar film showed chronic T12 fx. After neurosurgical consultation over the phone by the EDP, hospitalist was asked to admit patient for frequent falls, pain control and that she lives alone. Neurosurgery will see her in the am, but recommended only the placement of the [place] collar for 6 weeks     HOSPITAL COURSE:  Assessment/Plan:  1. C2 fracture: -Following fall,  Neurosurgery Dr.Jenkins reviewed case and CT scan and recommended Hard cervical collar for 6 weeks and FU in his Office  -Pain controlled, Tylenol and tramadol PRN   2. UTI: Growing MRSA sensitive to vancomycin, Bactrim  Received vancomycin in the hospital now switched to Bactrim for 2 weeks      3. HTN:  -stable   -continue amlodipine   4. H/o Seizure d/o  -continue Dilantin   5. Hypothyroidism:  -continue synthroid , TSH 5.805, will check a free T4     Code Status: DNR  Family Communication: none at bedside, called and d/w Lum Keas her son in Social worker.  Disposition Plan: SNF;    Discharge Exam:   Blood pressure 147/59, pulse 75, temperature 98.9 F (37.2 C), temperature source Oral, resp. rate 20, height 5\' 6"  (1.676 m), weight 64.8 kg (142 lb 13.7 oz), SpO2 93.00%. General: AAOx3, no distress HEENT: PERRLA, EOMI, Wearing hard cervical collar Cardiovascular: S1S2/RRR Respiratory: CTAB Abdomen: soft, NT, BS present Musculoskeletal: no edema c/c Neuro: no focal deficits            Follow-up Information   Follow up with Pamelia Hoit, MD In 1 week.   Contact information:   [address] [place] [address] [phone]       Follow up with Cristi Loron, MD In 3 weeks.   Contact information:   [address] [place] [address] [phone]       Signed: Richarda Overlie 02/08/2013, 7:45 AM

## 2013-02-09 MED ORDER — SULFAMETHOXAZOLE-TMP DS 800-160 MG PO TABS: 1.0000 | ORAL_TABLET | Freq: Two times a day (BID) | ORAL | Status: AC

## 2013-02-09 MED ORDER — TRAMADOL HCL 50 MG PO TABS: 100.0000 mg | ORAL_TABLET | Freq: Three times a day (TID) | ORAL | Status: DC | PRN

## 2013-02-09 NOTE — Progress Notes (Signed)
Physical Therapy Treatment Patient Details Name: Erica [name] MRN: [id number] DOB: 1917-06-01 Today's Date: 02/09/2013 Time: 4098-1191 PT Time Calculation (min): 17 min  PT Assessment / Plan / Recommendation Comments on Treatment Session  adjusted pt's cervical collar for pt's comfort. Pt mobility much improved and pt is functionally appropriate to return to home with support of family.      Follow Up Recommendations  Home health PT;Supervision - Intermittent     Does the patient have the potential to tolerate intense rehabilitation     Barriers to Discharge        Equipment Recommendations  None recommended by PT    Recommendations for Other Services    Frequency Min 3X/week   Plan Discharge plan remains appropriate;Frequency remains appropriate    Precautions / Restrictions Precautions Precautions: Cervical;Fall Required Braces or Orthoses: Cervical Brace Cervical Brace: Hard collar;Applied in sitting position Restrictions Weight Bearing Restrictions: No   Pertinent Vitals/Pain No c/o pain. Pt presents with [place] collar resting over her mouth not supporting the chin.  Repositioned collar for comfort and cervical support.      Mobility  Bed Mobility Bed Mobility: Supine to Sit;Rolling Right;Sit to Sidelying Right Rolling Right: 5: Supervision Right Sidelying to Sit: 5: Supervision Supine to Sit: 5: Supervision Sitting - Scoot to Edge of Bed: 5: Supervision Sit to Sidelying Right: 5: Supervision Transfers Transfers: Sit to Stand;Stand to Sit Sit to Stand: 6: Modified independent (Device/Increase time) Stand to Sit: 6: Modified independent (Device/Increase time) Ambulation/Gait Ambulation/Gait Assistance: 6: Modified independent (Device/Increase time) Ambulation Distance (Feet): 20 Feet Assistive device: Rolling walker Gait Pattern: Within Functional Limits Stairs: No Wheelchair Mobility Wheelchair Mobility: No    Exercises     PT Diagnosis:    PT  Problem List:   PT Treatment Interventions:     PT Goals Acute Rehab PT Goals PT Goal Formulation: With patient Time For Goal Achievement: 02/19/13 Potential to Achieve Goals: Good Pt will Roll Supine to Right Side: with supervision;with rail Pt will go Supine/Side to Sit: with supervision;with rail Pt will go Sit to Supine/Side: with supervision;with rail Pt will go Sit to Stand: with supervision PT Goal: Sit to Stand - Progress: Met Pt will go Stand to Sit: with supervision PT Goal: Stand to Sit - Progress: Met Pt will Ambulate: >150 feet;with supervision;with rolling walker PT Goal: Ambulate - Progress: Met Additional Goals Additional Goal #1: Pt will demonstrate independent management of cervical collar.   PT Goal: Additional Goal #1 - Progress: Progressing toward goal  Visit Information  Last PT Received On: 02/09/13 Assistance Needed: +1    Subjective Data  Subjective: Agreeable to amb; Enjoys being outside Patient Stated Goal: be able to return home   Cognition  Cognition Arousal/Alertness: Awake/alert Behavior During Therapy: WFL for tasks assessed/performed Overall Cognitive Status: Within Functional Limits for tasks assessed    Balance  Balance Balance Assessed: No  End of Session PT - End of Session Equipment Utilized During Treatment: Gait belt;Cervical collar Activity Tolerance: Patient tolerated treatment well Patient left: in chair;with call bell/phone within reach Nurse Communication: Mobility status   GP     Elana Jian 02/09/2013, 2:29 PM Aolani Piggott L. Quinterrius Errington DPT [phone]

## 2013-02-09 NOTE — Progress Notes (Signed)
AVS reviewed with pt;teach back method used. Pt able to verbalized understanding of AVS and questions were answered. RX's given. IV removed. Pt remains stable. Home health already set up by CM. Pt's son-in-law here to take her home. Pt escorted via wheelchair to exit of facility. Erica [name], Erica [name] Randy

## 2013-02-09 NOTE — Progress Notes (Signed)
   CARE MANAGEMENT NOTE 02/09/2013  Patient:  Erica [name], Erica [name]   Account Number:  [id number]  Date Initiated:  02/09/2013  Documentation initiated by:  Darlyne Russian  Subjective/Objective Assessment:   Patient admitted s/p fall at home, fracture C2.     Action/Plan:   Progression of care and discharge planning   Anticipated DC Date:  02/09/2013   Anticipated DC Plan:  HOME W HOME HEALTH SERVICES      DC Planning Services  CM consult      [facility] Choice  HOME HEALTH   Choice offered to / List presented to:  C-1 Patient        HH arranged  HH-1 RN  HH-3 OT  HH-2 PT      HH agency  [facility]   Status of service:  Completed, signed off Medicare Important Message given?   (If response is "NO", the following Medicare IM given date fields will be blank) Date Medicare IM given:   Date Additional Medicare IM given:    Discharge Disposition:  HOME W HOME HEALTH SERVICES  Per UR Regulation:    If discussed at Long Length of Stay Meetings, dates discussed:    Comments:  02/09/2013  [address] Darlyne Russian RN, [place]  [phone] CM referral:  Home Health RN, PT, OT  Spoke with patient regarding home health referral and offered choice of home health agency. She selected [facility].  [facility]Donna: referral given for [facility] RN, PT, OT

## 2013-02-09 NOTE — Discharge Summary (Signed)
Physician Discharge Summary  Erica [name]  MRN: [id number]  DOB/AGE: Mar 07, 1917 79 y.o.  PCP: Pamelia Hoit, MD  Admit date: 02/04/2013  Discharge date: 02/08/2013  Discharge Diagnoses:  MRSA UTI  Hypertension  Hypothyroidism  Seizure disorder  Hyperlipidemia  Fall  Vertigo  C2 cervical fracture    Medication List     TAKE these medications       acetaminophen 325 MG tablet    Commonly known as: TYLENOL    Take 2 tablets (650 mg total) by mouth every 6 (six) hours as needed.    amLODipine 2.5 MG tablet    Commonly known as: NORVASC    Take 2.5 mg by mouth daily.    B-complex with vitamin C tablet    Take 1 tablet by mouth daily. OTC [facility] brand    fexofenadine 180 MG tablet    Commonly known as: ALLEGRA    Take 180 mg by mouth daily.    furosemide 20 MG tablet    Commonly known as: LASIX    Take 20 mg by mouth daily.    levothyroxine 125 MCG tablet    Commonly known as: SYNTHROID, LEVOTHROID    Take 125 mcg by mouth daily before breakfast.    multivitamin with minerals Tabs    Take 1 tablet by mouth daily.    PARoxetine 20 MG tablet    Commonly known as: PAXIL    Take 20 mg by mouth daily.    phenytoin 100 MG ER capsule    Commonly known as: DILANTIN    Take 200 mg by mouth at bedtime.    potassium chloride 10 MEQ tablet    Commonly known as: K-DUR,KLOR-CON    Take 10 mEq by mouth daily.    sulfamethoxazole-trimethoprim 800-160 MG per tablet    Commonly known as: BACTRIM DS    Take 1 tablet by mouth every 12 (twelve) hours.for 2 weeks    traMADol 50 MG tablet    Commonly known as: ULTRAM    Take 1 tablet (50 mg total) by mouth every 8 (eight) hours as needed for pain.    Vitamin D3 2000 UNITS Tabs    Take 1 tablet by mouth daily.     Discharge Condition:  Disposition: SNF  Consults: *  Neurosurgery  Significant Diagnostic Studies:  Dg Chest 1 View  02/04/2013 *RADIOLOGY REPORT* Clinical Data: Mid and low back tenderness after fall tonight. CHEST  - 1 VIEW Comparison: 03/30/2012 Findings: Mild cardiac enlargement. Pulmonary vascularity is normal for technique. Calcified and tortuous aorta. Peribronchial thickening interstitial changes consistent with chronic bronchitis. No focal consolidation. No blunting of costophrenic angles. No pneumothorax. Degenerative changes in the spine and shoulders. No significant change since previous study. IMPRESSION: No evidence of active pulmonary disease. Mild cardiac enlargement and chronic bronchitic changes. Original Report Authenticated By: Burman Nieves, M.D.  Dg Thoracic Spine 2 View  02/04/2013 *RADIOLOGY REPORT* Clinical Data: The patient fell backwards tonight and complains of tenderness in the mid and lower back. THORACIC SPINE - 2 VIEW Comparison: Chest and lateral 09/29/2009. Findings: The normal alignment of the thoracic vertebrae. Diffuse degenerative changes with narrowed thoracic interspaces and associated endplate hypertrophic changes. Mild anterior compression of the superior endplate of T12 is stable since previous chest radiograph. No acute compression deformities are demonstrated. No focal bone lesion or bone destruction. No paraspinal soft tissue swelling. IMPRESSION: Diffuse degenerative changes in the thoracic spine. Stable mild compression of T12. No acute displaced fractures are demonstrated. Original  Report Authenticated By: Burman Nieves, M.D.  Dg Lumbar Spine 2-3 Views  02/04/2013 *RADIOLOGY REPORT* Clinical Data: Mid and low back tenderness after fall tonight. LUMBAR SPINE - 2-3 VIEW Comparison: CT myelogram 09/26/2003 Findings: Five lumbar type vertebrae. Diffuse bone demineralization. Diffuse degenerative change throughout the lumbar spine with narrowed cervical interspaces and prominent hypertrophic changes in the endplates. Degenerative changes in the facet joints. Mild lumbar scoliosis convex towards the right is likely degenerative. There is superior endplate compression of the T12  vertebra which appears stable since previous chest radiograph from 09/29/2009. Mild retrolisthesis of L2-L3 and mild anterolisthesis of L4-L5. These changes are stable and likely due to degenerative change. No acute vertebral compression deformities. No focal bone lesion or bone destruction. Vascular calcifications. IMPRESSION: The degenerative changes throughout the lumbar spine with diffuse demineralization. Chronic compression of the anterior T12 vertebral body. No acute displaced fractures are identified. Original Report Authenticated By: Burman Nieves, M.D.  Ct Head Wo Contrast  02/04/2013 *RADIOLOGY REPORT* Clinical Data: Fall. Head trauma. CT HEAD WITHOUT CONTRAST CT CERVICAL SPINE WITHOUT CONTRAST Technique: Multidetector CT imaging of the head and cervical spine was performed following the standard protocol without intravenous contrast. Multiplanar CT image reconstructions of the cervical spine were also generated. Comparison: 06/25/2012. CT HEAD Findings: Calvarium intact. Atrophy and chronic ischemic white matter disease is present. There is a mass in the anterior right middle cranial fossa extending into the anterior cranial fossa. This measures similar to the prior exam of 2013, 32 mm AP and 32 mm transverse. There is no hemorrhage. Ventricular size and configuration appears unchanged compared to prior. No hydrocephalus. No midline shift. Minimal mass effect from the right middle cranial fossa lesion. IMPRESSION: 1. No acute intracranial abnormality. 2. Atrophy and chronic ischemic white matter disease. 3. Unchanged anterior right middle cranial fossa mass lesion. This may represent a meningioma given the low growth. CT CERVICAL SPINE Findings: There is a comminuted C2 fracture. This is a combination of a type 3 odontoid fracture with extension into the lateral masses. No epidural hematoma or compression of the cervical cord as visualized grossly. The fracture is mildly impacted. Tip of the odontoid  is intact. Craniocervical junction appears within normal limits. C1 ring is intact. Fracture extends into both C2 lateral masses. 3 mm anterolisthesis of C2 on C3 may be traumatic or degenerative. Severe bilateral facet arthrosis is present. 2 mm anterolisthesis of C3 on C4 appears degenerative, again associated with facet arthrosis. Severe multilevel degenerative disc disease. Disc osteophyte complexes extend from C4-C5 through C6-C7. Ligamentum flavum and nuchal ligament calcification. Multilevel foraminal encroachment associated with uncovertebral spurring. Central stenosis at C5-C6 there is moderate. Sternoclavicular joint degenerative disease. No displaced rib fracture. Lung apices appear within normal limits. Carotid atherosclerosis incidentally noted. IMPRESSION: 1. Comminuted C2 fracture. This is a combination of a type 3 odontoid fracture with extension of the fracture into both lateral masses. 3 mm anterolisthesis of C2 on C3 may be traumatic or degenerative. No gross epidural hematoma identified on this noncontrast CT. The fracture components are minimally displaced. 2. Multilevel moderate to severe cervical spondylosis. Original Report Authenticated By: Andreas Newport, M.D.  Ct Cervical Spine Wo Contrast  02/04/2013 *RADIOLOGY REPORT* Clinical Data: Fall. Head trauma. CT HEAD WITHOUT CONTRAST CT CERVICAL SPINE WITHOUT CONTRAST Technique: Multidetector CT imaging of the head and cervical spine was performed following the standard protocol without intravenous contrast. Multiplanar CT image reconstructions of the cervical spine were also generated. Comparison: 06/25/2012. CT HEAD Findings: Calvarium intact. Atrophy  and chronic ischemic white matter disease is present. There is a mass in the anterior right middle cranial fossa extending into the anterior cranial fossa. This measures similar to the prior exam of 2013, 32 mm AP and 32 mm transverse. There is no hemorrhage. Ventricular size and configuration  appears unchanged compared to prior. No hydrocephalus. No midline shift. Minimal mass effect from the right middle cranial fossa lesion. IMPRESSION: 1. No acute intracranial abnormality. 2. Atrophy and chronic ischemic white matter disease. 3. Unchanged anterior right middle cranial fossa mass lesion. This may represent a meningioma given the low growth. CT CERVICAL SPINE Findings: There is a comminuted C2 fracture. This is a combination of a type 3 odontoid fracture with extension into the lateral masses. No epidural hematoma or compression of the cervical cord as visualized grossly. The fracture is mildly impacted. Tip of the odontoid is intact. Craniocervical junction appears within normal limits. C1 ring is intact. Fracture extends into both C2 lateral masses. 3 mm anterolisthesis of C2 on C3 may be traumatic or degenerative. Severe bilateral facet arthrosis is present. 2 mm anterolisthesis of C3 on C4 appears degenerative, again associated with facet arthrosis. Severe multilevel degenerative disc disease. Disc osteophyte complexes extend from C4-C5 through C6-C7. Ligamentum flavum and nuchal ligament calcification. Multilevel foraminal encroachment associated with uncovertebral spurring. Central stenosis at C5-C6 there is moderate. Sternoclavicular joint degenerative disease. No displaced rib fracture. Lung apices appear within normal limits. Carotid atherosclerosis incidentally noted. IMPRESSION: 1. Comminuted C2 fracture. This is a combination of a type 3 odontoid fracture with extension of the fracture into both lateral masses. 3 mm anterolisthesis of C2 on C3 may be traumatic or degenerative. No gross epidural hematoma identified on this noncontrast CT. The fracture components are minimally displaced. 2. Multilevel moderate to severe cervical spondylosis. Original Report Authenticated By: Andreas Newport, M.D.  Microbiology:  Recent Results (from the past 240 hour(s))   URINE CULTURE Status: None     Collection Time    02/04/13 11:30 PM   Result  Value  Range  Status    Specimen Description  URINE, RANDOM   Final    Special Requests  CX ADDED AT 2357 ON [id number]   Final    Culture Setup Time  02/05/2013 02:01   Final    Colony Count  >=100,000 COLONIES/ML   Final    Culture    Final    Value:  METHICILLIN RESISTANT STAPHYLOCOCCUS AUREUS     Note: RIFAMPIN AND GENTAMICIN SHOULD NOT BE USED AS SINGLE DRUGS FOR TREATMENT OF STAPH INFECTIONS. CRITICAL RESULT CALLED TO, READ BACK BY AND VERIFIED WITH: TY HICKS@1550  ON [id number] BY [facility]    Report Status  02/07/2013 FINAL   Final    Organism ID, Bacteria  METHICILLIN RESISTANT STAPHYLOCOCCUS AUREUS   Final    Labs:  No results found for this or any previous visit (from the past 48 hour(s)).  HPI  79 y.o. female with hx of intermittent vertigo this past year, HTN, depression, Seizure, presents to the ER after falling. She denied LOC, HA, chest pain or palpitation. Evaluation in the ER with negative head CT ( a stable ?meningioma), Neck CT with C2 diminuted Fx, and Lumbar film showed chronic T12 fx. After neurosurgical consultation over the phone by the EDP, hospitalist was asked to admit patient for frequent falls, pain control and that she lives alone. Neurosurgery will see her in the am, but recommended only the placement of the [place] collar for 6 weeks  HOSPITAL COURSE:  Assessment/Plan:  1. C2 fracture: -Following fall,  Neurosurgery Dr.Jenkins reviewed case and CT scan and recommended Hard cervical collar for 6 weeks and FU in his Office  -Pain controlled, Tylenol and tramadol PRN   2. UTI: Growing MRSA sensitive to vancomycin, Bactrim  Received vancomycin in the hospital now switched to Bactrim for 2 weeks    3. HTN:  -stable  -continue amlodipine    4. H/o Seizure d/o  -continue Dilantin    5. Hypothyroidism:  -continue synthroid , TSH 5.805,   free T4 0.97  Code Status: DNR  Family Communication: none at bedside,  called and d/w Erica [name] her son in Social worker.  Disposition Plan: home with HHPT      Discharge Exam:  Blood pressure 147/59, pulse 75, temperature 98.9 F (37.2 C), temperature source Oral, resp. rate 20, height 5\' 6"  (1.676 m), weight 64.8 kg (142 lb 13.7 oz), SpO2 93.00%.  General: AAOx3, no distress HEENT: PERRLA, EOMI, Wearing hard cervical collar Cardiovascular: S1S2/RRR Respiratory: CTAB Abdomen: soft, NT, BS present Musculoskeletal: no edema c/c Neuro: no focal deficits       Follow-up Information    Follow up with Pamelia Hoit, MD In 1 week.    Contact information:    [address] [place] [address]  [phone]       Follow up with Cristi Loron, MD In 3 weeks.    Contact information:    [address]  [address]  [place] [address]  [phone]

## 2013-02-14 LAB — CULTURE, BLOOD (ROUTINE X 2): Culture: NO GROWTH

## 2013-04-07 ENCOUNTER — Emergency Department (HOSPITAL_COMMUNITY): Payer: Medicare Other

## 2013-04-07 ENCOUNTER — Observation Stay (HOSPITAL_COMMUNITY)
Admission: EM | Admit: 2013-04-07 | Discharge: 2013-04-10 | Disposition: A | Discharge status: Home / Self Care | Payer: Medicare Other | Attending: Orthopedic Surgery | Admitting: Orthopedic Surgery

## 2013-04-07 ENCOUNTER — Encounter (HOSPITAL_COMMUNITY): Payer: Self-pay | Admitting: Anesthesiology

## 2013-04-07 ENCOUNTER — Encounter (HOSPITAL_COMMUNITY)
Admission: EM | Disposition: A | Discharge status: Home / Self Care | Payer: Self-pay | Source: Home / Self Care | Attending: Emergency Medicine

## 2013-04-07 ENCOUNTER — Encounter (HOSPITAL_COMMUNITY): Payer: Self-pay | Admitting: *Deleted

## 2013-04-07 ENCOUNTER — Emergency Department (HOSPITAL_COMMUNITY): Payer: Medicare Other | Admitting: Anesthesiology

## 2013-04-07 DIAGNOSIS — Y92009 Unspecified place in unspecified non-institutional (private) residence as the place of occurrence of the external cause: Secondary | ICD-10-CM | POA: Insufficient documentation

## 2013-04-07 DIAGNOSIS — I4949 Other premature depolarization: Secondary | ICD-10-CM | POA: Insufficient documentation

## 2013-04-07 DIAGNOSIS — S62101B Fracture of unspecified carpal bone, right wrist, initial encounter for open fracture: Secondary | ICD-10-CM

## 2013-04-07 DIAGNOSIS — S63599A Other specified sprain of unspecified wrist, initial encounter: Secondary | ICD-10-CM | POA: Insufficient documentation

## 2013-04-07 DIAGNOSIS — I10 Essential (primary) hypertension: Secondary | ICD-10-CM | POA: Insufficient documentation

## 2013-04-07 DIAGNOSIS — W010XXA Fall on same level from slipping, tripping and stumbling without subsequent striking against object, initial encounter: Secondary | ICD-10-CM | POA: Insufficient documentation

## 2013-04-07 DIAGNOSIS — S66819A Strain of other specified muscles, fascia and tendons at wrist and hand level, unspecified hand, initial encounter: Secondary | ICD-10-CM | POA: Insufficient documentation

## 2013-04-07 DIAGNOSIS — Z79899 Other long term (current) drug therapy: Secondary | ICD-10-CM | POA: Insufficient documentation

## 2013-04-07 DIAGNOSIS — S52509B Unspecified fracture of the lower end of unspecified radius, initial encounter for open fracture type I or II: Principal | ICD-10-CM | POA: Insufficient documentation

## 2013-04-07 DIAGNOSIS — I491 Atrial premature depolarization: Secondary | ICD-10-CM | POA: Insufficient documentation

## 2013-04-07 HISTORY — PX: OPEN REDUCTION INTERNAL FIXATION (ORIF) DISTAL RADIAL FRACTURE: SHX5989

## 2013-04-07 LAB — POCT I-STAT, CHEM 8
BUN: 13 mg/dL (ref 6–23)
Calcium, Ion: 1.06 mmol/L — ABNORMAL LOW (ref 1.13–1.30)
Chloride: 99 meq/L (ref 96–112)
Creatinine, Ser: 0.6 mg/dL (ref 0.50–1.10)
Glucose, Bld: 108 mg/dL — ABNORMAL HIGH (ref 70–99)
HCT: 42 % (ref 36.0–46.0)
Hemoglobin: 14.3 g/dL (ref 12.0–15.0)
Potassium: 4 meq/L (ref 3.5–5.1)
Sodium: 141 meq/L (ref 135–145)
TCO2: 35 mmol/L (ref 0–100)

## 2013-04-07 LAB — CBC WITH DIFFERENTIAL/PLATELET
Basophils Absolute: 0 10*3/uL (ref 0.0–0.1)
Basophils Relative: 0 % (ref 0–1)
Eosinophils Absolute: 0.1 10*3/uL (ref 0.0–0.7)
Eosinophils Relative: 1 % (ref 0–5)
HCT: 39.3 % (ref 36.0–46.0)
Hemoglobin: 13.2 g/dL (ref 12.0–15.0)
MCH: 31.9 pg (ref 26.0–34.0)
MCHC: 33.6 g/dL (ref 30.0–36.0)
MCV: 94.9 fL (ref 78.0–100.0)
Monocytes Absolute: 0.8 10*3/uL (ref 0.1–1.0)
Monocytes Relative: 8 % (ref 3–12)
RDW: 13.6 % (ref 11.5–15.5)

## 2013-04-07 SURGERY — OPEN REDUCTION INTERNAL FIXATION (ORIF) DISTAL RADIUS FRACTURE
Anesthesia: General | Site: Arm Lower | Laterality: Right | Wound class: Dirty or Infected

## 2013-04-07 MED ORDER — PROPOFOL 10 MG/ML IV BOLUS
INTRAVENOUS | Status: DC | PRN
  Administered 2013-04-07: 110 mg via INTRAVENOUS

## 2013-04-07 MED ORDER — LIDOCAINE HCL (CARDIAC) 20 MG/ML IV SOLN
INTRAVENOUS | Status: DC | PRN
  Administered 2013-04-07: 25 mg via INTRAVENOUS
  Administered 2013-04-07: 50 mg via INTRAVENOUS

## 2013-04-07 MED ORDER — DEXTROSE 5 % IV SOLN
INTRAVENOUS | Status: DC | PRN
  Administered 2013-04-07: 21:00:00 via INTRAVENOUS

## 2013-04-07 MED ORDER — CEFAZOLIN SODIUM 1-5 GM-% IV SOLN
INTRAVENOUS | Status: DC | PRN
  Administered 2013-04-07: 1 g via INTRAVENOUS

## 2013-04-07 MED ORDER — FENTANYL CITRATE 0.05 MG/ML IJ SOLN
INTRAMUSCULAR | Status: DC | PRN
  Administered 2013-04-07: 50 ug via INTRAVENOUS
  Administered 2013-04-07: 25 ug via INTRAVENOUS

## 2013-04-07 MED ORDER — SODIUM CHLORIDE 0.9 % IV SOLN
INTRAVENOUS | Status: DC | PRN
  Administered 2013-04-07: 21:00:00 via INTRAVENOUS

## 2013-04-07 MED ORDER — PHENYLEPHRINE HCL 10 MG/ML IJ SOLN
INTRAMUSCULAR | Status: DC | PRN
  Administered 2013-04-07 (×2): 80 ug via INTRAVENOUS
  Administered 2013-04-07: 40 ug via INTRAVENOUS

## 2013-04-07 MED ORDER — BUPIVACAINE-EPINEPHRINE 0.5% -1:200000 IJ SOLN
INTRAMUSCULAR | Status: DC | PRN
  Administered 2013-04-07: 10 mL

## 2013-04-07 MED ORDER — ONDANSETRON HCL 4 MG/2ML IJ SOLN
INTRAMUSCULAR | Status: DC | PRN
  Administered 2013-04-07: 4 mg via INTRAVENOUS

## 2013-04-07 MED ORDER — 0.9 % SODIUM CHLORIDE (POUR BTL) OPTIME
TOPICAL | Status: DC | PRN
  Administered 2013-04-07: 1000 mL

## 2013-04-07 MED ORDER — CEFAZOLIN SODIUM 1-5 GM-% IV SOLN
1.0000 g | Freq: Once | INTRAVENOUS | Status: AC
  Administered 2013-04-07: 1 g via INTRAVENOUS
  Filled 2013-04-07: qty 50

## 2013-04-07 MED ORDER — BUPIVACAINE-EPINEPHRINE (PF) 0.5% -1:200000 IJ SOLN
INTRAMUSCULAR | Status: AC
  Filled 2013-04-07: qty 10

## 2013-04-07 MED ORDER — CEFAZOLIN SODIUM 1-5 GM-% IV SOLN
INTRAVENOUS | Status: AC
  Filled 2013-04-07: qty 50

## 2013-04-07 MED ORDER — FENTANYL CITRATE 0.05 MG/ML IJ SOLN: 25.0000 ug | INTRAMUSCULAR | Status: DC | PRN

## 2013-04-07 SURGICAL SUPPLY — 64 items
BANDAGE COBAN STERILE 2 (GAUZE/BANDAGES/DRESSINGS) IMPLANT
BANDAGE GAUZE ELAST BULKY 4 IN (GAUZE/BANDAGES/DRESSINGS) ×4 IMPLANT
BIT DRILL 2.2 SS TIBIAL (BIT) ×1 IMPLANT
BLADE SURG 15 STRL LF DISP TIS (BLADE) ×1 IMPLANT
BLADE SURG 15 STRL SS (BLADE) ×2
BNDG CMPR 9X4 STRL LF SNTH (GAUZE/BANDAGES/DRESSINGS) ×1
BNDG COHESIVE 4X5 TAN STRL (GAUZE/BANDAGES/DRESSINGS) ×2 IMPLANT
BNDG ESMARK 4X9 LF (GAUZE/BANDAGES/DRESSINGS) ×2 IMPLANT
BRUSH SCRUB EZ PLAIN DRY (MISCELLANEOUS) ×2 IMPLANT
CANISTER SUCTION 2500CC (MISCELLANEOUS) ×2 IMPLANT
CHLORAPREP W/TINT 26ML (MISCELLANEOUS) ×2 IMPLANT
CORDS BIPOLAR (ELECTRODE) ×2 IMPLANT
COVER SURGICAL LIGHT HANDLE (MISCELLANEOUS) ×2 IMPLANT
COVER TABLE BACK 60X90 (DRAPES) ×2 IMPLANT
CUFF TOURNIQUET SINGLE 18IN (TOURNIQUET CUFF) ×1 IMPLANT
CUFF TOURNIQUET SINGLE 24IN (TOURNIQUET CUFF) IMPLANT
DRAPE C-ARM 42X72 X-RAY (DRAPES) ×2 IMPLANT
DRAPE SURG 17X23 STRL (DRAPES) ×2 IMPLANT
DRSG ADAPTIC 3X8 NADH LF (GAUZE/BANDAGES/DRESSINGS) ×2 IMPLANT
DRSG EMULSION OIL 3X3 NADH (GAUZE/BANDAGES/DRESSINGS) ×2 IMPLANT
GLOVE BIO SURGEON STRL SZ7.5 (GLOVE) ×2 IMPLANT
GLOVE BIOGEL PI IND STRL 6.5 (GLOVE) IMPLANT
GLOVE BIOGEL PI IND STRL 7.0 (GLOVE) IMPLANT
GLOVE BIOGEL PI IND STRL 8 (GLOVE) ×1 IMPLANT
GLOVE BIOGEL PI INDICATOR 6.5 (GLOVE) ×1
GLOVE BIOGEL PI INDICATOR 7.0 (GLOVE) ×1
GLOVE BIOGEL PI INDICATOR 8 (GLOVE) ×1
GLOVE ECLIPSE 6.5 STRL STRAW (GLOVE) ×2 IMPLANT
GOWN PREVENTION PLUS XLARGE (GOWN DISPOSABLE) ×2 IMPLANT
K-WIRE 1.6 (WIRE) ×4
K-WIRE FX5X1.6XNS BN SS (WIRE) ×2
KIT BASIN OR (CUSTOM PROCEDURE TRAY) ×2 IMPLANT
KWIRE FX5X1.6XNS BN SS (WIRE) ×2 IMPLANT
NDL HYPO 25X1 1.5 SAFETY (NEEDLE) IMPLANT
NEEDLE HYPO 25X1 1.5 SAFETY (NEEDLE) ×2 IMPLANT
NS IRRIG 1000ML POUR BTL (IV SOLUTION) ×2 IMPLANT
PACK ORTHO EXTREMITY (CUSTOM PROCEDURE TRAY) ×2 IMPLANT
PAD CAST 4YDX4 CTTN HI CHSV (CAST SUPPLIES) ×1 IMPLANT
PADDING CAST ABS 4INX4YD NS (CAST SUPPLIES)
PADDING CAST ABS COTTON 4X4 ST (CAST SUPPLIES) IMPLANT
PADDING CAST COTTON 4X4 STRL (CAST SUPPLIES) ×2
PEG LOCKING SMOOTH 2.2X18 (Peg) ×3 IMPLANT
PEG LOCKING SMOOTH 2.2X20 (Screw) ×2 IMPLANT
PEG LOCKING SMOOTH 2.2X22 (Screw) ×2 IMPLANT
PENCIL BUTTON HOLSTER BLD 10FT (ELECTRODE) IMPLANT
PLATE DVR CROSSLOCK STD RT (Plate) ×1 IMPLANT
RUBBERBAND STERILE (MISCELLANEOUS) IMPLANT
SCREW 2.7X12MM (Screw) ×1 IMPLANT
SCREW 2.7X14MM (Screw) ×2 IMPLANT
SCREW BN 14X2.7XNONLOCK 3 LD (Screw) ×2 IMPLANT
SCREW LOCK 14X2.7X 3 LD TPR (Screw) ×1 IMPLANT
SCREW LOCKING 2.7X14 (Screw) ×2 IMPLANT
SCREW NLOCK 2.7X14 (Screw) ×2 IMPLANT
SLING ARM FOAM STRAP MED (SOFTGOODS) ×1 IMPLANT
SPLINT FIBERGLASS 3X12 (CAST SUPPLIES) ×1 IMPLANT
SPONGE GAUZE 4X4 12PLY (GAUZE/BANDAGES/DRESSINGS) ×2 IMPLANT
SUT VIC AB 2-0 CT3 27 (SUTURE) ×2 IMPLANT
SUT VICRYL 4-0 PS2 18IN ABS (SUTURE) ×2 IMPLANT
SUT VICRYL RAPIDE 4/0 PS 2 (SUTURE) ×3 IMPLANT
SYR CONTROL 10ML LL (SYRINGE) ×1 IMPLANT
SYRINGE 10CC LL (SYRINGE) IMPLANT
TOWEL OR 17X24 6PK STRL BLUE (TOWEL DISPOSABLE) ×2 IMPLANT
TUBE CONNECTING 12X1/4 (SUCTIONS) ×2 IMPLANT
UNDERPAD 30X30 INCONTINENT (UNDERPADS AND DIAPERS) ×2 IMPLANT

## 2013-04-07 NOTE — Consult Note (Signed)
ORTHOPAEDIC CONSULTATION  REQUESTING PHYSICIAN: Rolan Bucco, MD  Chief Complaint: right wrist injury  HPI: Erica [name] is a 79 y.o. female who fell backward to her right hand. She had the onset of pain and deformity at the wrist, bleeding from a laceration as well. She wears a C. collar all the time because of a cervical fracture that occurred in May.  Past Medical History  Diagnosis Date  . Hypertension   . Hemorrhoids   . Thyroid disease   . Osteoarthritis   . Seizures   . Depression   . Hyperlipidemia   . Wears glasses   . Cancer     skin  . Bruises easily   . Skin cancer    Past Surgical History  Procedure Laterality Date  . Cataract extraction, bilateral    . Carpal tunnel release      left hand  . Shoulder arthroscopy  1998    right  . Total hip arthroplasty      right  . Total knee arthroplasty      right   . Rotator cuff repair      left shoulder  . Total knee arthroplasty      left   . Cholecystectomy    . Breast cyst excision      left  . Abdominal hysterectomy    . Tonsellectomy    . Skin lesion excision  05/25/2011  . Lesion removal  11/11/2011    Procedure: MINOR EXICISION OF LESION;  Surgeon: Kandis Cocking, MD;  Location: [facility];  Service: General;  Laterality: Right;  right leg  . Mass excision  12/21/2011    Procedure: MINOR EXCISION OF MASS;  Surgeon: Kandis Cocking, MD;  Location: [facility];  Service: General;  Laterality: Left;  excision of lesion left leg-3cm   History   Social History  . Marital Status: Widowed    Spouse Name: N/A    Number of Children: N/A  . Years of Education: N/A   Social History Main Topics  . Smoking status: Never Smoker   . Smokeless tobacco: Never Used  . Alcohol Use: No  . Drug Use: No  . Sexually Active: Not Currently   Other Topics Concern  . None   Social History Narrative  . None   Family History  Problem Relation Age of Onset  . Heart disease  Father   . Heart disease Sister   . Cancer Brother     mouth and throat   Allergies  Allergen Reactions  . Clarithromycin Other (See Comments)    "made me ill"-reaction years ago  . Ditropan (Oxybutynin Chloride) Itching and Other (See Comments)    Constipation  . Prednisone Other (See Comments)    Stomach pain & dizziness  . Vioxx (Rofecoxib) Other (See Comments)    dizziness   Prior to Admission medications   Medication Sig Start Date End Date Taking? Authorizing Provider  acetaminophen (TYLENOL) 325 MG tablet Take 2 tablets (650 mg total) by mouth every 6 (six) hours as needed. 02/06/13  Yes Zannie Cove, MD  amLODipine (NORVASC) 2.5 MG tablet Take 2.5 mg by mouth daily.  05/28/11  Yes Historical Provider, MD  B Complex-C (B-COMPLEX WITH VITAMIN C) tablet Take 1 tablet by mouth daily. OTC  [facility] brand   Yes Historical Provider, MD  Calcium Carb-Cholecalciferol (CALCIUM + D3) 600-200 MG-UNIT TABS Take 1 tablet by mouth daily.   Yes Historical Provider, MD  Cholecalciferol (VITAMIN  D3) 2000 UNITS TABS Take 1 tablet by mouth daily.   Yes Historical Provider, MD  fexofenadine (ALLEGRA) 180 MG tablet Take 180 mg by mouth daily.   Yes Historical Provider, MD  furosemide (LASIX) 20 MG tablet Take 20 mg by mouth daily.   Yes Historical Provider, MD  levothyroxine (SYNTHROID, LEVOTHROID) 125 MCG tablet Take 125 mcg by mouth daily before breakfast.   Yes Historical Provider, MD  Multiple Vitamin (MULTIVITAMIN WITH MINERALS) TABS Take 1 tablet by mouth daily.   Yes Historical Provider, MD  PARoxetine (PAXIL) 20 MG tablet Take 20 mg by mouth daily.    Yes Historical Provider, MD  phenytoin (DILANTIN) 100 MG ER capsule Take 200 mg by mouth at bedtime.   Yes Historical Provider, MD  potassium chloride (K-DUR,KLOR-CON) 10 MEQ tablet Take 10 mEq by mouth daily.   Yes Historical Provider, MD  traMADol (ULTRAM) 50 MG tablet Take 2 tablets (100 mg total) by mouth every 8 (eight) hours as needed for pain.  02/09/13  Yes Richarda Overlie, MD   Dg Wrist Complete Right  04/07/2013   *RADIOLOGY REPORT*  Clinical Data: Fall.  Right wrist pain.  Deformity.  RIGHT WRIST - COMPLETE 3+ VIEW  Comparison: None.  Findings: Dorsal Barton fracture observed with moderate comminution and intra-articular extension, and dorsal displacement of distal radial fracture fragments along with the carpus.  Small transverse fracture of the tip of the ulnar styloid. Regional bony demineralization observed.  Degenerative spurring of the first carpometacarpal articulation.  The bony demineralization and difficulty with positioning cause mild obscuration of portions of the distal carpal row and reduced sensitivity for carpal fracture.  IMPRESSION:  1.  Dorsal Barton fracture with comminuted radial intra-articular component. 2.  Small fracture of the tip of the ulnar styloid.   Original Report Authenticated By: Gaylyn Rong, M.D.    Positive ROS: All other systems have been reviewed and were otherwise negative with the exception of those mentioned in the HPI and as above.  Physical Exam: Vitals: Refer to EMR. Constitutional:  WD, WN, NAD HEENT:  NCAT, EOMI Neuro/Psych:  Alert & oriented to person, place, and time; appropriate mood & affect Lymphatic: No generalized UE edema or lymphadenopathy Extremities / MSK:  The extremities are normal with respect to appearance, ranges of motion, joint stability, muscle strength/tone, sensation, & perfusion except as otherwise noted:   The right forearm as a provisional splint applied. She has intact light touch sensation in the radial, median, and ulnar nerve distributions with intact motor to the same. There is some dried blood it is difficult to see a laceration which by report is on the volar ulnar aspect of the wrist. She has a ring upon the ring finger which I was able to remove using soap and gave to her son-in-law. It is unclear  whether this represents a open fracture or a closed  fracture with an associated skin tear. There is no tenderness about the elbow. . Assessment: Markedly displaced very distal right distal radius fracture, could potentially be open  Plan: I discussed these findings with the patient and her family present. I discussed the need for operative treatment to address the wound, and then to stabilize the fracture with either internal fixation or external fixation, or perhaps a combination of the 2. Goals, risks, and options were reviewed and consent was obtained. She will likely be kept overnight for IV antibiotics and PT assessment for the fitness for home discharged with a platform device for a walker  versus need for skilled nursing placement.  Cliffton Asters Janee Morn, MD     Mobile [phone] Orthopaedic [facility] Hand [facility] [address] [place], [place]  [address] [phone]

## 2013-04-07 NOTE — ED Provider Notes (Signed)
History    CSN: [id number] Arrival date & time 04/07/13  1651  First MD Initiated Contact with Patient 04/07/13 1718     Chief Complaint  Patient presents with  . Wrist Injury   (Consider location/radiation/quality/duration/timing/severity/associated sxs/prior Treatment) HPI Comments: Patient is a 79 year old female who presents for right wrist pain of onset this afternoon. Patient states that she was turning to get something out of her kitchen counter when she lost her footing. Patient fell backwards and extended her right arm behind her, causing primary impact to the right wrist. Patient admits to joint swelling and bleeding from the volar aspect of the right wrist joint. She admits to pain it is focal and nonradiating, worse with palpation and wrist movement. Pain is alleviated by morphine given via EMS prior to arrival. Patient denies the use of blood thinners. She further denies hitting her head or loss of consciousness during the fall.  Patient is a 79 y.o. female presenting with wrist injury. The history is provided by the patient. No language interpreter was used.  Wrist Injury Associated symptoms: no back pain, no fever and no neck pain    Past Medical History  Diagnosis Date  . Hypertension   . Hemorrhoids   . Thyroid disease   . Osteoarthritis   . Seizures   . Depression   . Hyperlipidemia   . Wears glasses   . Cancer     skin  . Bruises easily   . Skin cancer    Past Surgical History  Procedure Laterality Date  . Cataract extraction, bilateral    . Carpal tunnel release      left hand  . Shoulder arthroscopy  1998    right  . Total hip arthroplasty      right  . Total knee arthroplasty      right   . Rotator cuff repair      left shoulder  . Total knee arthroplasty      left   . Cholecystectomy    . Breast cyst excision      left  . Abdominal hysterectomy    . Tonsellectomy    . Skin lesion excision  05/25/2011  . Lesion removal  11/11/2011     Procedure: MINOR EXICISION OF LESION;  Surgeon: Kandis Cocking, MD;  Location: [facility];  Service: General;  Laterality: Right;  right leg  . Mass excision  12/21/2011    Procedure: MINOR EXCISION OF MASS;  Surgeon: Kandis Cocking, MD;  Location: [facility];  Service: General;  Laterality: Left;  excision of lesion left leg-3cm   Family History  Problem Relation Age of Onset  . Heart disease Father   . Heart disease Sister   . Cancer Brother     mouth and throat   History  Substance Use Topics  . Smoking status: Never Smoker   . Smokeless tobacco: Never Used  . Alcohol Use: No   OB History   Grav Para Term Preterm Abortions TAB SAB Ect Mult Living                 Review of Systems  Constitutional: Negative for fever.  HENT: Negative for neck pain and neck stiffness.   Musculoskeletal: Positive for joint swelling and arthralgias. Negative for back pain.  Skin: Positive for wound. Negative for pallor.  Neurological: Negative for weakness and numbness.  All other systems reviewed and are negative.    Allergies  Clarithromycin; Ditropan; Prednisone;  and Vioxx  Home Medications   Current Outpatient Rx  Name  Route  Sig  Dispense  Refill  . acetaminophen (TYLENOL) 325 MG tablet   Oral   Take 2 tablets (650 mg total) by mouth every 6 (six) hours as needed.         Marland Kitchen amLODipine (NORVASC) 2.5 MG tablet   Oral   Take 2.5 mg by mouth daily.          . B Complex-C (B-COMPLEX WITH VITAMIN C) tablet   Oral   Take 1 tablet by mouth daily. OTC  [facility] brand         . Calcium Carb-Cholecalciferol (CALCIUM + D3) 600-200 MG-UNIT TABS   Oral   Take 1 tablet by mouth daily.         . Cholecalciferol (VITAMIN D3) 2000 UNITS TABS   Oral   Take 1 tablet by mouth daily.         . fexofenadine (ALLEGRA) 180 MG tablet   Oral   Take 180 mg by mouth daily.         . furosemide (LASIX) 20 MG tablet   Oral   Take 20 mg by mouth daily.          Marland Kitchen levothyroxine (SYNTHROID, LEVOTHROID) 125 MCG tablet   Oral   Take 125 mcg by mouth daily before breakfast.         . Multiple Vitamin (MULTIVITAMIN WITH MINERALS) TABS   Oral   Take 1 tablet by mouth daily.         Marland Kitchen PARoxetine (PAXIL) 20 MG tablet   Oral   Take 20 mg by mouth daily.          . phenytoin (DILANTIN) 100 MG ER capsule   Oral   Take 200 mg by mouth at bedtime.         . potassium chloride (K-DUR,KLOR-CON) 10 MEQ tablet   Oral   Take 10 mEq by mouth daily.         . traMADol (ULTRAM) 50 MG tablet   Oral   Take 2 tablets (100 mg total) by mouth every 8 (eight) hours as needed for pain.   30 tablet   0    BP 139/63  Pulse 81  Temp(Src) 98 F (36.7 C) (Oral)  Resp 23  SpO2 95% Physical Exam  Nursing note and vitals reviewed. Constitutional: She is oriented to person, place, and time. She appears well-developed and well-nourished. No distress.  HENT:  Head: Normocephalic and atraumatic.  Eyes: Conjunctivae and EOM are normal. Pupils are equal, round, and reactive to light. No scleral icterus.  Neck: Normal range of motion. Neck supple.  Patient arrived in cervical collar. Cervical collar removed and had stabilized while palpating midline. Patient denies cervical midline tenderness. No bony deformities or step-offs palpated. Patient impact to her head or neck with fall. Patient clears NEXUS criteria.  Cardiovascular: Normal rate, regular rhythm, normal heart sounds and intact distal pulses.   Distal radial pulses 2+ bilaterally. Capillary refill normal.  Pulmonary/Chest: Effort normal and breath sounds normal. No respiratory distress. She has no wheezes. She has no rales.  Musculoskeletal: She exhibits tenderness (Right wrist).       Cervical back: Normal.       Thoracic back: Normal.       Lumbar back: Normal.  Gross swelling and deformity of right wrist joint. Decreased range of motion secondary to injury. 2 cm laceration appreciated on the  lateral  volar aspect of wrist joint; bleeding controlled. Exam of right hand and forearm without significant findings. No TTP of cervical, thoracic, or lumbar midline. No ecchymosis, abrasions, or swelling of back to suggest trauma. No bony deformities or step offs palpated.  Lymphadenopathy:    She has no cervical adenopathy.  Neurological: She is alert and oriented to person, place, and time. She exhibits normal muscle tone.  Patient is able to wiggle her fingers on her right hand. She denies change in sensation with light touch in her right upper extremity and hand.  Skin: She is not diaphoretic.  Psychiatric: She has a normal mood and affect. Her behavior is normal.    ED Course  Procedures (including critical care time) Labs Reviewed  CBC WITH DIFFERENTIAL   Dg Wrist Complete Right  04/07/2013   *RADIOLOGY REPORT*  Clinical Data: Fall.  Right wrist pain.  Deformity.  RIGHT WRIST - COMPLETE 3+ VIEW  Comparison: None.  Findings: Dorsal Barton fracture observed with moderate comminution and intra-articular extension, and dorsal displacement of distal radial fracture fragments along with the carpus.  Small transverse fracture of the tip of the ulnar styloid. Regional bony demineralization observed.  Degenerative spurring of the first carpometacarpal articulation.  The bony demineralization and difficulty with positioning cause mild obscuration of portions of the distal carpal row and reduced sensitivity for carpal fracture.  IMPRESSION:  1.  Dorsal Barton fracture with comminuted radial intra-articular component. 2.  Small fracture of the tip of the ulnar styloid.   Original Report Authenticated By: Gaylyn Rong, M.D.     Date: 04/07/2013  Rate: 84  Rhythm: normal sinus rhythm, premature atrial contractions (PAC) and premature ventricular contractions (PVC); poor quality EKG secondary to artifact  QRS Axis: left  Intervals: normal  ST/T Wave abnormalities: normal  Conduction  Disutrbances:nonspecific intraventricular conduction delay  Narrative Interpretation: NSR with PVCs (4) and PACs (2); poor quality secondary to artifact. No STEMI  Old EKG Reviewed: unchanged from 02/04/13 I have personally reviewed and interpreted this EKG   1. Barton fracture of R wrist 2. Ulnar styloid fracture   MDM  79 y/o with R wrist pain and deformity after mechanical fall at home. Xray significant for dorsal Barton fracture with complete displacement of fracture fragments as well as small fracture of tip of ulnar styloid. No pulselessness, pallor, poikilothermia, or paresthesias on exam; neurovascularly intact. Consult to be placed to Dr. Janee Morn of hand surgery. Patient's last meal was approximately 9 hours ago. Tetanus UTD; administered last year by PCP.  Ancef IV, CBC, and Chem 8 ordered. Dr. Janee Morn to see.      Antony Madura, PA-C 04/07/13 1948

## 2013-04-07 NOTE — Transfer of Care (Signed)
Immediate Anesthesia Transfer of Care Note  Patient: Erica [name]  Procedure(s) Performed: Procedure(s): OPEN REDUCTION INTERNAL FIXATION (ORIF) DISTAL RADIAL FRACTURE (Right)  Patient Location: PACU  Anesthesia Type:General  Level of Consciousness: oriented, sedated, patient cooperative and responds to stimulation  Airway & Oxygen Therapy: Patient Spontanous Breathing and Patient connected to nasal cannula oxygen  Post-op Assessment: Report given to PACU RN, Post -op Vital signs reviewed and stable, Patient moving all extremities and Patient moving all extremities X 4  Post vital signs: Reviewed and stable  Complications: No apparent anesthesia complications

## 2013-04-07 NOTE — Anesthesia Preprocedure Evaluation (Signed)
Anesthesia Evaluation  Patient identified by MRN, date of birth, ID band Patient awake    Reviewed: Allergy & Precautions, H&P , NPO status , Patient's Chart, lab work & pertinent test results  Airway Mallampati: II TM Distance: >3 FB Neck ROM: Limited    Dental no notable dental hx. (+) Teeth Intact and Dental Advisory Given   Pulmonary neg pulmonary ROS,  breath sounds clear to auscultation  Pulmonary exam normal       Cardiovascular hypertension, On Medications Rhythm:Regular Rate:Normal     Neuro/Psych Seizures -, Well Controlled,  PSYCHIATRIC DISORDERS  Neuromuscular disease negative neurological ROS     GI/Hepatic negative GI ROS, Neg liver ROS,   Endo/Other  Hypothyroidism   Renal/GU negative Renal ROS  negative genitourinary   Musculoskeletal   Abdominal   Peds  Hematology negative hematology ROS (+)   Anesthesia Other Findings   Reproductive/Obstetrics negative OB ROS                           Anesthesia Physical Anesthesia Plan  ASA: III  Anesthesia Plan: General   Post-op Pain Management:    Induction: Intravenous  Airway Management Planned: LMA  Additional Equipment:   Intra-op Plan:   Post-operative Plan: Extubation in OR  Informed Consent: I have reviewed the patients History and Physical, chart, labs and discussed the procedure including the risks, benefits and alternatives for the proposed anesthesia with the patient or authorized representative who has indicated his/her understanding and acceptance.   Dental advisory given  Plan Discussed with: CRNA  Anesthesia Plan Comments:         Anesthesia Quick Evaluation

## 2013-04-07 NOTE — ED Notes (Signed)
Pt at home. Was in kitchen, turned and fell. Landed on right wrist. [facility] applied gauze and arm board to right wrist. EMS administered 4mg  morphine via 18g piv in left a/c. Brought pt pain down from 8 to 2. Pt alert and oriented x 4, neuro intact.  Right wrist has deformity, swelling, and bleeding to right wrist. Bleeding controlled at this time.

## 2013-04-07 NOTE — Anesthesia Postprocedure Evaluation (Signed)
  Anesthesia Post-op Note  Patient: Erica [name]  Procedure(s) Performed: Procedure(s): OPEN REDUCTION INTERNAL FIXATION (ORIF) DISTAL RADIAL FRACTURE (Right)  Patient Location: PACU  Anesthesia Type:General  Level of Consciousness: awake and alert   Airway and Oxygen Therapy: Patient Spontanous Breathing and Patient connected to nasal cannula oxygen  Post-op Pain: none  Post-op Assessment: Post-op Vital signs reviewed, Patient's Cardiovascular Status Stable, Respiratory Function Stable, Patent Airway and No signs of Nausea or vomiting  Post-op Vital Signs: Reviewed and stable  Complications: No apparent anesthesia complications

## 2013-04-07 NOTE — ED Notes (Signed)
Patient transported to X-ray 

## 2013-04-07 NOTE — Op Note (Signed)
04/07/2013  7:55 PM  PATIENT:  Erica [name]  79 y.o. female  PRE-OPERATIVE DIAGNOSIS:  Right distal radius and ulnar styloid fractures with associated open wound  POST-OPERATIVE DIAGNOSIS:  Comminuted right distal radius fracture and open ulnar wound with extrusion of distal ulna through the wound  PROCEDURE:  ORIF right distal radius fracture and open repair of the TFCC; debridement of skin and subcutaneous tissue from 2.5 cm open ulnar wound and closure of same wound  SURGEON: Cliffton Asters. Janee Morn, MD  PHYSICIAN ASSISTANT: None  ANESTHESIA:  general  SPECIMENS:  None  DRAINS:   None  PREOPERATIVE INDICATIONS:  Erica [name] is a  79 y.o. female who fell earlier today backwards onto outstretched hand sustaining a comminuted and displaced right distal radius fracture and an associated ulnar-sided wound  The risks benefits and alternatives were discussed with the patient preoperatively including but not limited to the risks of infection, bleeding, nerve injury, cardiopulmonary complications, the need for revision surgery, among others, and the patient verbalized understanding and consented to proceed.  OPERATIVE IMPLANTS: Biomet DVR plate and screws  OPERATIVE FINDINGS: Comminuted intra-articular distal radius fracture with more than 3 fragments, near-anatomic reduction following weight application. On the ulnar side, the distal ulna had extruded through the wound and the TFC was torn from the ulna. This was repaired with a 2-0 Vicryl figure-of-eight suture through drill holes.  OPERATIVE PROCEDURE:  After receiving prophylactic antibiotics, the patient was escorted to the operative theatre and placed in a supine position. General anesthesia was administered.  A surgical "time-out" was performed during which the planned procedure, proposed operative site, and the correct patient identity were compared to the operative consent and agreement confirmed by the circulating  nurse according to current facility policy.  Following application of a tourniquet to the operative extremity, the exposed skin was pre-scrubbed with a Hibiclens scrub brush before being formally prepped with Chloraprep and draped in the usual sterile fashion.  The limb was exsanguinated with an Esmarch bandage and the tourniquet inflated to approximately higher than systolic BP.  The ulnar wound was explored first. There appeared to be no dirt or debris. It was copiously irrigated skin edges were trimmed and debrided. Subcutaneous tissue was debrided as well. The ulnar styloid could be extruded through the wound due to instability radius fracture. This radius was provisionally reduced and found likely acceptable to consider internal fixation with plate fixation. Standard volar approach the distal radius was employed. The pronator quadratus was found to be shredded but was still reflected in an L-shaped. Brachial radialis was split in a Z-plasty for later reapproximation. The distal radius was manipulated into near-anatomic alignment and this was secured with placement of a standard width short DVR plate. Placement of hardware was fluoroscopically guided and although juxta articular did not appear to be intra-articular. The distal takes were deliberately placed a short with each measure to avoid dorsal tendon problems. Once satisfied with this, the DRUJ was assessed and found to have mild instability throughout the range to include full pronation and supination.  On the ulnar side, the TFC was repaired with 2-0 Vicryl suture in a figure-of-eight fashion through a transverse drill hole in the distal ulna made with the K wire from the DVR set.  2 a Vicryl suture was used to further repair the local tissues around the distal ulna. DRUJ stability was enhanced. Tourniquet was released the wounds were copiously irrigated and ulnarly the skin was closed with 4-0 Vicryl Rapide  interrupted and running horizontal  mattress sutures. In the major wound, the brachial radialis was repaired with 2-0 Vicryl suture, the distal aspect of the pronator repaired as best could be done to help cover the distal aspect of the plate and the skin was repaired with 4-0 Vicryl Rapide interrupted and running sutures. Local anesthetic was instilled in around both incisions and a bulky splint and a dressing was applied with fiberglass slabs placing the forearm semisupinated. She was awakened and taken to the recovery room in stable condition.  DISPOSITION: She will be admitted overnight for continued IV antibiotics and assessment by PT and discharge planning as to whether she can safely go home bearing weight thru a platform device or will need short-term SNF placement.

## 2013-04-07 NOTE — ED Provider Notes (Signed)
Medical screening examination/treatment/procedure(s) were conducted as a shared visit with non-physician practitioner(s) and myself.  I personally evaluated the patient during the encounter   Rolan Bucco, MD 04/07/13 2328

## 2013-04-07 NOTE — Preoperative (Signed)
Beta Blockers   Reason not to administer Beta Blockers:Not Applicable 

## 2013-04-07 NOTE — Anesthesia Procedure Notes (Signed)
Procedure Name: LMA Insertion Date/Time: 04/07/2013 8:50 PM Performed by: Wray Kearns A Pre-anesthesia Checklist: Patient identified, Timeout performed, Emergency Drugs available, Suction available and Patient being monitored Patient Re-evaluated:Patient Re-evaluated prior to inductionOxygen Delivery Method: Circle system utilized Preoxygenation: Pre-oxygenation with 100% oxygen Intubation Type: IV induction Ventilation: Mask ventilation without difficulty LMA: LMA inserted LMA Size: 3.0 Tube type: Oral Number of attempts: 1 Placement Confirmation: breath sounds checked- equal and bilateral and positive ETCO2 Tube secured with: Tape Dental Injury: Teeth and Oropharynx as per pre-operative assessment  Comments: Pt neck kept in neutral position throughout induction ; and during procedure .

## 2013-04-08 ENCOUNTER — Encounter (HOSPITAL_COMMUNITY): Payer: Self-pay | Admitting: *Deleted

## 2013-04-08 MED ORDER — MAGNESIUM HYDROXIDE 400 MG/5ML PO SUSP: 30.0000 mL | Freq: Every day | ORAL | Status: DC | PRN

## 2013-04-08 MED ORDER — B COMPLEX-C PO TABS
1.0000 | ORAL_TABLET | Freq: Every day | ORAL | Status: DC
  Administered 2013-04-08 – 2013-04-10 (×3): 1 via ORAL
  Filled 2013-04-08 (×3): qty 1

## 2013-04-08 MED ORDER — ADULT MULTIVITAMIN W/MINERALS CH
1.0000 | ORAL_TABLET | Freq: Every day | ORAL | Status: DC
  Administered 2013-04-08 – 2013-04-10 (×3): 1 via ORAL
  Filled 2013-04-08 (×3): qty 1

## 2013-04-08 MED ORDER — LORATADINE 10 MG PO TABS
10.0000 mg | ORAL_TABLET | Freq: Every day | ORAL | Status: DC
  Administered 2013-04-08 – 2013-04-10 (×3): 10 mg via ORAL
  Filled 2013-04-08 (×3): qty 1

## 2013-04-08 MED ORDER — CEFAZOLIN SODIUM 1-5 GM-% IV SOLN
1.0000 g | Freq: Three times a day (TID) | INTRAVENOUS | Status: AC
  Administered 2013-04-08 (×2): 1 g via INTRAVENOUS
  Filled 2013-04-08 (×2): qty 50

## 2013-04-08 MED ORDER — ONDANSETRON HCL 4 MG PO TABS: 4.0000 mg | ORAL_TABLET | Freq: Four times a day (QID) | ORAL | Status: DC | PRN

## 2013-04-08 MED ORDER — HYDROMORPHONE HCL PF 1 MG/ML IJ SOLN
0.5000 mg | INTRAMUSCULAR | Status: DC | PRN
Start: 2013-04-08 — End: 2013-04-10

## 2013-04-08 MED ORDER — PHENYTOIN SODIUM EXTENDED 100 MG PO CAPS
200.0000 mg | ORAL_CAPSULE | Freq: Every day | ORAL | Status: DC
  Administered 2013-04-08 – 2013-04-09 (×2): 200 mg via ORAL
  Filled 2013-04-08 (×3): qty 2

## 2013-04-08 MED ORDER — VITAMIN C 500 MG PO TABS
1000.0000 mg | ORAL_TABLET | Freq: Every day | ORAL | Status: DC
  Administered 2013-04-08 – 2013-04-10 (×3): 1000 mg via ORAL
  Filled 2013-04-08 (×3): qty 2

## 2013-04-08 MED ORDER — LEVOTHYROXINE SODIUM 125 MCG PO TABS
125.0000 ug | ORAL_TABLET | Freq: Every day | ORAL | Status: DC
  Administered 2013-04-08 – 2013-04-10 (×3): 125 ug via ORAL
  Filled 2013-04-08 (×4): qty 1

## 2013-04-08 MED ORDER — PAROXETINE HCL 20 MG PO TABS
20.0000 mg | ORAL_TABLET | Freq: Every day | ORAL | Status: DC
  Administered 2013-04-08 – 2013-04-10 (×3): 20 mg via ORAL
  Filled 2013-04-08 (×3): qty 1

## 2013-04-08 MED ORDER — FUROSEMIDE 20 MG PO TABS
20.0000 mg | ORAL_TABLET | Freq: Every day | ORAL | Status: DC
  Administered 2013-04-08 – 2013-04-10 (×3): 20 mg via ORAL
  Filled 2013-04-08 (×3): qty 1

## 2013-04-08 MED ORDER — HYDROCODONE-ACETAMINOPHEN 5-325 MG PO TABS: 1.0000 | ORAL_TABLET | ORAL | Status: DC | PRN

## 2013-04-08 MED ORDER — VITAMIN D3 25 MCG (1000 UNIT) PO TABS
2000.0000 [IU] | ORAL_TABLET | Freq: Every day | ORAL | Status: DC
  Administered 2013-04-08 – 2013-04-10 (×3): 2000 [IU] via ORAL
  Filled 2013-04-08 (×3): qty 2

## 2013-04-08 MED ORDER — ONDANSETRON HCL 4 MG/2ML IJ SOLN: 4.0000 mg | Freq: Four times a day (QID) | INTRAMUSCULAR | Status: DC | PRN

## 2013-04-08 MED ORDER — CEFAZOLIN SODIUM 1-5 GM-% IV SOLN
1.0000 g | INTRAVENOUS | Status: AC
  Administered 2013-04-08: 1 g via INTRAVENOUS
  Filled 2013-04-08: qty 50

## 2013-04-08 MED ORDER — CALCIUM CARBONATE-VITAMIN D 500-200 MG-UNIT PO TABS
1.0000 | ORAL_TABLET | Freq: Every day | ORAL | Status: DC
  Administered 2013-04-08 – 2013-04-10 (×3): 1 via ORAL
  Filled 2013-04-08 (×4): qty 1

## 2013-04-08 MED ORDER — CALCIUM + D3 600-200 MG-UNIT PO TABS: 1.0000 | ORAL_TABLET | Freq: Every day | ORAL | Status: DC

## 2013-04-08 MED ORDER — CEFAZOLIN SODIUM-DEXTROSE 2-3 GM-% IV SOLR: 2.0000 g | INTRAVENOUS | Status: DC

## 2013-04-08 MED ORDER — HYDROCODONE-ACETAMINOPHEN 5-325 MG PO TABS
1.0000 | ORAL_TABLET | ORAL | Status: DC | PRN
Start: 2013-04-08 — End: 2013-04-10

## 2013-04-08 MED ORDER — DOCUSATE SODIUM 100 MG PO CAPS
100.0000 mg | ORAL_CAPSULE | Freq: Two times a day (BID) | ORAL | Status: DC
  Administered 2013-04-08 – 2013-04-10 (×5): 100 mg via ORAL
  Filled 2013-04-08 (×6): qty 1

## 2013-04-08 MED ORDER — POTASSIUM CHLORIDE CRYS ER 10 MEQ PO TBCR
10.0000 meq | EXTENDED_RELEASE_TABLET | Freq: Every day | ORAL | Status: DC
  Administered 2013-04-08 – 2013-04-10 (×3): 10 meq via ORAL
  Filled 2013-04-08 (×3): qty 1

## 2013-04-08 MED ORDER — AMLODIPINE BESYLATE 2.5 MG PO TABS
2.5000 mg | ORAL_TABLET | Freq: Every day | ORAL | Status: DC
  Administered 2013-04-08 – 2013-04-10 (×3): 2.5 mg via ORAL
  Filled 2013-04-08 (×3): qty 1

## 2013-04-08 NOTE — Care Management Utilization Note (Signed)
Utilization review completed.  

## 2013-04-08 NOTE — Evaluation (Addendum)
Occupational Therapy Evaluation Patient Details Name: KYARRA VANCAMP MRN: [id number] DOB: [birth year] Today's Date: 04/08/2013 Time: 7829-5621 OT Time Calculation (min): 32 min  OT Assessment / Plan / Recommendation History of present illness Fall on Outstretched hand resulting in R radius fx; recent C2 fx; in hard collar   Clinical Impression   Pt s/p ORIF right distal radius fx and open repair of the TFCC.  Pt is now quite limited due to being right hand dominant and is now PWB (no lifting/gripping/grasping >2 lbs and needs to  use platform RW). Pt wishes to return home but would need 24/7 supervision/assist (son in law could provide PRN per pt report).  At this time will recommend [facility] as safest d/c plan.  Will update plan accordingly if pt refuses SNF.    OT Assessment  Patient needs continued OT Services    Follow Up Recommendations  SNF;Supervision/Assistance - 24 hour    Barriers to Discharge Decreased caregiver support prn assist from son in Designer, multimedia Recommendations   (platform RW)    Recommendations for Other Services    Frequency  Min 2X/week    Precautions / Restrictions Precautions Precautions: Fall;Cervical Required Braces or Orthoses: Other Brace/Splint Other Brace/Splint: Hard collar Restrictions Weight Bearing Restrictions: Yes RUE Weight Bearing: Weight bear through elbow only Other Position/Activity Restrictions: No lifting, gripping or grasping >2 lbs with Right hand. Allowed to use RUE for light ADL tasks.   Pertinent Vitals/Pain See vitals    ADL  Eating/Feeding: Performed;Set up Where Assessed - Eating/Feeding: Chair Grooming: Performed;Wash/dry hands;Supervision/safety Where Assessed - Grooming: Supported standing Upper Body Bathing: Simulated;Minimal assistance Where Assessed - Upper Body Bathing: Supported sitting Lower Body Bathing: Simulated;Minimal assistance Where Assessed - Lower Body Bathing: Supported sit to stand Upper  Body Dressing: Performed;Moderate assistance Where Assessed - Upper Body Dressing: Supported sitting Lower Body Dressing: Simulated;Moderate assistance Where Assessed - Lower Body Dressing: Supported sit to Pharmacist, hospital: Performed;Minimal Dentist Method: Sit to Barista: Comfort height toilet;Grab bars Toileting - Clothing Manipulation and Hygiene: Performed;Minimal assistance Where Assessed - Engineer, mining and Hygiene: Sit to stand from 3-in-1 or toilet Equipment Used:  (platform walker, cervical collar) Transfers/Ambulation Related to ADLs: Min guard ambulating with RW. Pt with occasional posterior sway but is able to self correct. Close guarding for safety.  Min assist for sit<>Stand for steadying and to assist with placement of RUE on/off of platform walker. ADL Comments: Pt requiring assist for clothing manipulation at toilet. Encouraged pt to use RUE for tasks such as pulling gown up over hips during toileting.   Educated pt on moving fingers frequently and to keep RUE elevated on 3 pillows while in chair and bed in order to assist with edema control.    OT Diagnosis: Generalized weakness;Acute pain  OT Problem List: Decreased strength;Decreased activity tolerance;Impaired balance (sitting and/or standing);Decreased knowledge of use of DME or AE;Decreased knowledge of precautions;Impaired UE functional use OT Treatment Interventions: Self-care/ADL training;DME and/or AE instruction;Therapeutic activities;Patient/family education;Balance training   OT Goals(Current goals can be found in the care plan section) Acute Rehab OT Goals Patient Stated Goal: to return home and live her life OT Goal Formulation: With patient Time For Goal Achievement: 04/22/13 Potential to Achieve Goals: Good  Visit Information  Last OT Received On: 04/08/13 Assistance Needed: +1 PT/OT Co-Evaluation/Treatment: Yes History of Present Illness:  Fall on Outstretched hand resulting in R radius fx; recent C2 fx; in hard collar  Prior Functioning     Home Living Family/patient expects to be discharged to:: Private residence Living Arrangements: Alone Available Help at Discharge: Family;Available PRN/intermittently Type of Home: House Home Access: Ramped entrance Home Layout: One level Prior Function Level of Independence: Independent with assistive device(s) 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  Shoulder ROM WFL.   RUE Sensation: decreased light touch     Mobility Bed Mobility Bed Mobility: Not assessed Details for Bed Mobility Assistance: pt up in chair Transfers Transfers: Sit to Stand;Stand to Sit Sit to Stand: 4: Min assist;From chair/3-in-1;From toilet;With upper extremity assist Stand to Sit: 4: Min assist;To chair/3-in-1;To toilet;With armrests;With upper extremity assist Details for Transfer Assistance: Pt using LUE for support. Requires physical assist to position RUE on/off platform walker.      Exercise     Balance Balance Balance Assessed: Yes Static Standing Balance Static Standing - Balance Support: Bilateral upper extremity supported Static Standing - Level of Assistance: 5: Stand by assistance;4: Min assist Static Standing - Comment/# of Minutes: Pt requires close guarding due to occasional posterior sway. Pt able to self correct but needs close guarding for safety.   End of Session OT - End of  Session Equipment Utilized During Treatment: Gait belt;Rolling walker Activity Tolerance: Patient tolerated treatment well Patient left: in chair;with call bell/phone within reach Nurse Communication: Mobility status  GO Functional Assessment Tool Used: clinical judgement Functional Limitation: Self care Self Care Current Status (J4782): At least 20 percent but less than 40 percent impaired, limited or restricted Self Care Goal Status [id number]): 0 percent impaired, limited or restricted  04/08/2013 Cipriano Mile OTR/L Pager [phone] Office [phone]  Cipriano Mile 04/08/2013, 4:02 PM

## 2013-04-08 NOTE — Progress Notes (Signed)
Clinical Social Work Department BRIEF PSYCHOSOCIAL ASSESSMENT 04/08/2013  Patient:  Erica [name], Erica [name]     Account Number:  [id number]     Admit date:  04/07/2013  Clinical Social Worker:  Dennison Bulla  Date/Time:  04/08/2013 02:30 PM  Referred by:  Physician  Date Referred:  04/08/2013 Referred for  SNF Placement   Other Referral:   Interview type:  Patient Other interview type:    PSYCHOSOCIAL DATA Living Status:  ALONE Admitted from facility:   Level of care:   Primary support name:  Erica [name] Primary support relationship to patient:  CHILD, ADULT Degree of support available:   Strong    CURRENT CONCERNS Current Concerns  Post-Acute Placement   Other Concerns:    SOCIAL WORK ASSESSMENT / PLAN CSW received referral to assist with DC planning. CSW reviewed chart and spoke with CM who reports that patient only meet observation status.    CSW met with patient and son at bedside. Patient lives alone and was completing all ADLs independently prior to fall. CSW explained possible SNF placement and that patient was only meeting observation status. Son appeared upset and reported that Medicare should have to pay for SNF since patient had surgery. CSW explained that patient could still go to SNF but would have to pay privately. Patient has stayed at [facility] in the past and would only return there. Patient reports she can afford to pay privately and is agreeable to SNF search. CSW provided patient with SNF list in order to have other options if desired.    CSW completed FL2 and faxed out to [facility].   Assessment/plan status:  Psychosocial Support/Ongoing Assessment of Needs Other assessment/ plan:   Information/referral to community resources:   SNF information  CSW explained Medicare would not cover SNF stay and patient would be expected to pay privately    PATIENT'S/FAMILY'S RESPONSE TO PLAN OF CARE: Patient was alert and oriented. Patient and son  disappointed about Medicare status but agreeable to SNF search.       WEEKEND COVERAGE

## 2013-04-08 NOTE — Progress Notes (Signed)
Clinical Social Work Department CLINICAL SOCIAL WORK PLACEMENT NOTE 04/08/2013  Patient:  STARLINA, LAPRE  Account Number:  [id number] Admit date:  04/07/2013  Clinical Social Worker:  Unk Lightning, LCSW  Date/time:  04/08/2013 02:30 PM  Clinical Social Work is seeking post-discharge placement for this patient at the following level of care:   SKILLED NURSING   (*CSW will update this form in Epic as items are completed)   04/08/2013  Patient/family provided with [facility] Department of Clinical Social Work's list of facilities offering this level of care within the geographic area requested by the patient (or if unable, by the patient's family).  04/08/2013  Patient/family informed of their freedom to choose among providers that offer the needed level of care, that participate in Medicare, Medicaid or managed care program needed by the patient, have an available bed and are willing to accept the patient.  04/08/2013  Patient/family informed of [facility]' ownership interest in [facility], as well as of the fact that they are under no obligation to receive care at this facility.  PASARR submitted to EDS on existing # PASARR number received from EDS on   FL2 transmitted to all facilities in geographic area requested by pt/family on  04/08/2013 FL2 transmitted to all facilities within larger geographic area on   Patient informed that his/her managed care company has contracts with or will negotiate with  certain facilities, including the following:     Patient/family informed of bed offers received:   Patient chooses bed at  Physician recommends and patient chooses bed at    Patient to be transferred to  on   Patient to be transferred to facility by   The following physician request were entered in Epic:   Additional Comments:

## 2013-04-08 NOTE — Evaluation (Signed)
Physical Therapy Evaluation Patient Details Name: Erica [name] MRN: [id number] DOB: 1917-07-09 Today's Date: 04/08/2013 Time: 4098-1191 PT Time Calculation (min): 29 min  PT Assessment / Plan / Recommendation History of Present Illness  Fall on Outstretched hand resulting in R radius fx; recent C2 fx; in hard collar  Clinical Impression  Presents with decr functional mobility, gait unsteadiness; Will benefit from PT to maximize independence and safety with mobility, and to assist in dc planning  Agree with OT -- must recommend SNF as the safest dc option, considering recent falls, now with new restrictions of use of her dominant hand, and unsure if son-in-law can give assist the pt needs;  Still, pt very much wants to dc home, and noted in SW note, that pt may be private pay for SNF stay; Will update dc recs if pt refuses SNF    PT Assessment  Patient needs continued PT services    Follow Up Recommendations  SNF;Supervision/Assistance - 24 hour Max services and Rplatform RW if pt refuses SNF    Does the patient have the potential to tolerate intense rehabilitation      Barriers to Discharge Decreased caregiver support Not sure if pt's son-in-law will be able to provide 24 hour assist    Equipment Recommendations  Other (comment) (platform RW)    Recommendations for Other Services     Frequency Min 5X/week    Precautions / Restrictions Precautions Precautions: Fall;Cervical Required Braces or Orthoses: Other Brace/Splint Other Brace/Splint: Hard collar Restrictions Weight Bearing Restrictions: Yes RUE Weight Bearing: Weight bear through elbow only Other Position/Activity Restrictions: No lifting, gripping or grasping >2 lbs with Right hand. Allowed to use RUE for light ADL tasks.   Pertinent Vitals/Pain Soreness; did not rate elevated for edema and pain control       Mobility  Bed Mobility Bed Mobility: Not assessed Details for Bed Mobility Assistance:  pt up in chair Transfers Transfers: Sit to Stand;Stand to Sit Sit to Stand: 4: Min assist;From chair/3-in-1;From toilet;With upper extremity assist Stand to Sit: 4: Min assist;To chair/3-in-1;To toilet;With armrests;With upper extremity assist Details for Transfer Assistance: Pt using LUE for support. Requires physical assist to position RUE on/off platform walker.  Ambulation/Gait Ambulation/Gait Assistance: 4: Min assist Ambulation Distance (Feet): 100 Feet Assistive device: Right platform walker Ambulation/Gait Assistance Details: Cues for posture, use of platform RW,  and to self-monitor for activity tolerance; physical assist needed occasionally for steadying Gait Pattern: Decreased stride length    Exercises     PT Diagnosis: Acute pain;Difficulty walking  PT Problem List: Decreased strength;Decreased range of motion;Decreased activity tolerance;Decreased balance;Decreased mobility;Decreased knowledge of use of DME;Decreased knowledge of precautions;Pain PT Treatment Interventions: DME instruction;Gait training;Stair training;Functional mobility training;Therapeutic activities;Therapeutic exercise;Patient/family education     PT Goals(Current goals can be found in the care plan section) Acute Rehab PT Goals Patient Stated Goal: to return home and live her life PT Goal Formulation: With patient Time For Goal Achievement: 04/22/13 Potential to Achieve Goals: Good  Visit Information  Last PT Received On: 04/08/13 Assistance Needed: +1 PT/OT Co-Evaluation/Treatment: Yes History of Present Illness: Fall on Outstretched hand resulting in R radius fx; recent C2 fx; in hard collar       Prior Functioning  Home Living Family/patient expects to be discharged to:: Private residence Living Arrangements: Alone Available Help at Discharge: Family;Available PRN/intermittently Type of Home: House Home Access: Ramped entrance Home Layout: One level Home Equipment: Walker - 2  wheels Prior Function Level of Independence: Independent with  assistive device(s) 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  Shoulder ROM WFL.   RUE Sensation: decreased light touch Lower Extremity Assessment Lower Extremity Assessment: Generalized weakness Cervical / Trunk Assessment Cervical / Trunk Assessment: Other exceptions Cervical / Trunk Exceptions: C spine immobilized in collar   Balance Balance Balance Assessed: Yes Static Standing Balance Static Standing - Balance Support: Bilateral upper extremity supported Static Standing - Level of Assistance: 5: Stand by assistance;4: Min assist Static Standing - Comment/# of Minutes: Noted occasional posterior lean; needs close guard  End of Session PT - End of Session Activity Tolerance: Patient tolerated treatment well Patient left: in chair;with call bell/phone within reach Nurse Communication: Mobility status  GP Functional Assessment Tool Used: Clinical Judgement Functional Limitation: Mobility: Walking and moving around Mobility: Walking and Moving Around Current Status (Z6109): At least 1 percent but less than 20 percent impaired, limited or restricted Mobility: Walking and Moving Around Goal Status [phone]): 0 percent impaired, limited or restricted   Van Clines [facility] [place], [place] [phone]  04/08/2013, 5:26 PM

## 2013-04-08 NOTE — Progress Notes (Signed)
Subjective: POD1 ORIF R DRFx & open TFCC rpr Resting comfortably, doing well. Reports no numbness   Objective: Vital signs in last 24 hours: Temp:  [97.8 F (36.6 C)-98.5 F (36.9 C)] 97.9 F (36.6 C) (06/28 2335) Pulse Rate:  [71-157] 73 (06/28 2335) Resp:  [15-23] 18 (06/28 2335) BP: (119-145)/(42-84) 119/46 mmHg (06/28 2335) SpO2:  [94 %-100 %] 97 % (06/28 2335) Weight:  [64.864 kg (143 lb)] 64.864 kg (143 lb) (06/28 2335)  Intake/Output from previous day: 06/28 0701 - 06/29 0700 In: 800 [I.V.:800] Out: 11 [Urine:1; Blood:10] Intake/Output this shift:     Recent Labs  04/07/13 1838 04/07/13 1956  HGB 13.2 14.3    Recent Labs  04/07/13 1838 04/07/13 1956  WBC 9.2  --   RBC 4.14  --   HCT 39.3 42.0  PLT 161  --     Recent Labs  04/07/13 1956  NA 141  K 4.0  CL 99  BUN 13  CREATININE 0.60  GLUCOSE 108*   No results found for this basename: LABPT, INR,  in the last 72 hours  Dressing/splint intact.  Intact LT Sens R/M/U with intact motor to same. Fingers well-perfused  Assessment/Plan: PT to determine patient's fitness for d/c today with platform walker vs. Need for SNF. Pt may d/c if found fit and appropriate.  Pt. Desires to d/c home if possible. F/u 10-15 days in office.   Gail Creekmore A. 04/08/2013, 7:32 AM

## 2013-04-09 NOTE — Care Management Note (Signed)
    Page 1 of 2   04/10/2013     12:24:53 PM   CARE MANAGEMENT NOTE 04/10/2013  Patient:  Erica [name], Erica [name]   Account Number:  [id number]  Date Initiated:  04/09/2013  Documentation initiated by:  [facility]  Subjective/Objective Assessment:   admitted rt distal radius open fracture     Action/Plan:   PT/OT evals-recommended HHPT and HHOT   Anticipated DC Date:  04/09/2013   Anticipated DC Plan:  HOME W HOME HEALTH SERVICES      DC Planning Services  CM consult      Choice offered to / List presented to:  C-1 Patient   DME arranged  WALKER - PLATFORM      DME agency  [facility]     HH arranged  HH-2 PT  HH-3 OT  HH-4 NURSE'S AIDE      HH agency  [facility]   Status of service:  Completed, signed off Medicare Important Message given?   (If response is "NO", the following Medicare IM given date fields will be blank) Date Medicare IM given:   Date Additional Medicare IM given:    Discharge Disposition:  HOME W HOME HEALTH SERVICES  Per UR Regulation:    If discussed at Long Length of Stay Meetings, dates discussed:    Comments:  04/10/13 Rt arm platform added to patient's walker by [facility]. Will have aide visits in addition to HHPT/OT. Jacquelynn Cree RN, BSN, CCM  04/09/13 Per CSW patient does not wish to go to SNF.Spoke with patient about HHC, she selected [facility] for HHPT and HHOT. Contacted Rakhi Romagnoli with [facility] and set up HHPT and HHOT.Patient already has a rolling walker at home, will need a rt arm platform. Patent had son in law bring her walker from home and rt arm platform was added to it by [facility]. Patient stated that she will have assistance at home 24/7.  Jacquelynn Cree RN, BSN, CCM

## 2013-04-09 NOTE — Progress Notes (Signed)
Physical Therapy Treatment Patient Details Name: Erica [name] MRN: [id number] DOB: Feb 22, 1917 Today's Date: 04/09/2013 Time: 5621-3086 PT Time Calculation (min): 19 min  PT Assessment / Plan / Recommendation  PT Comments   Good progress with ambulation, especially with balance; Pt is choosing to dc home at this time; Recommend max HHservices and 24 hour assist  Follow Up Recommendations  Supervision/Assistance - 24 hour;Home health PT     Does the patient have the potential to tolerate intense rehabilitation     Barriers to Discharge        Equipment Recommendations  Other (comment) (platform RW)    Recommendations for Other Services    Frequency Min 5X/week   Progress towards PT Goals Progress towards PT goals: Progressing toward goals  Plan Discharge plan needs to be updated    Precautions / Restrictions Precautions Precautions: Fall;Cervical Required Braces or Orthoses: Other Brace/Splint Other Brace/Splint: Hard collar Restrictions Weight Bearing Restrictions: Yes RUE Weight Bearing: Weight bear through elbow only Other Position/Activity Restrictions: No lifting, gripping or grasping >2 lbs with Right hand. Allowed to use RUE for light ADL tasks.   Pertinent Vitals/Pain Some soreness in wrist; sis not rate elevated for edema and pain control     Mobility  Bed Mobility Bed Mobility: Supine to Sit Supine to Sit: 4: Min assist Details for Bed Mobility Assistance: Cues for technique and prec; min assist to elevate trunk from bed  Transfers Transfers: Sit to Stand;Stand to Sit Sit to Stand: 4: Min assist;From chair/3-in-1;From toilet;With upper extremity assist Stand to Sit: 4: Min assist;To chair/3-in-1;To toilet;With armrests;With upper extremity assist Details for Transfer Assistance: Pt using LUE for support. Requires physical assist to gently translate weight over feet Ambulation/Gait Ambulation/Gait Assistance: 4: Min guard Ambulation Distance  (Feet): 100 Feet Assistive device: Right platform walker Ambulation/Gait Assistance Details: Much less need for assist for steadying Gait Pattern: Decreased stride length    Exercises     PT Diagnosis:    PT Problem List:   PT Treatment Interventions:     PT Goals (current goals can now be found in the care plan section) Acute Rehab PT Goals Patient Stated Goal: to return home and live her life PT Goal Formulation: With patient Time For Goal Achievement: 04/22/13 Potential to Achieve Goals: Good  Visit Information  Last PT Received On: 04/09/13 Assistance Needed: +1 History of Present Illness: Fall on Outstretched hand resulting in R radius fx; recent C2 fx; in hard collar    Subjective Data  Patient Stated Goal: to return home and live her life   Cognition  Cognition Arousal/Alertness: Awake/alert Behavior During Therapy: WFL for tasks assessed/performed Overall Cognitive Status: Within Functional Limits for tasks assessed    Balance  Balance Balance Assessed: Yes Static Standing Balance Static Standing - Balance Support: Bilateral upper extremity supported Static Standing - Level of Assistance: 5: Stand by assistance;4: Min assist Static Standing - Comment/# of Minutes: Less post lean  End of Session PT - End of Session Activity Tolerance: Patient tolerated treatment well Patient left: in chair;with call bell/phone within reach Nurse Communication: Mobility status   GP     Van Clines [facility] [place], [place] [phone]  04/09/2013, 4:55 PM

## 2013-04-09 NOTE — Discharge Summary (Signed)
Physician Discharge Summary  Patient ID: Erica [name] MRN: [id number] DOB/AGE: 77-Jul-1918 77 y.o.  Admit date: 04/07/2013 Discharge date: 04/09/2013  Admission Diagnoses:  Right distal radius fracture/ Open TFCC tear  Discharge Diagnoses:  Right distal radius fracture/Open TFCC tear  Past Medical History  Diagnosis Date  . Hypertension   . Hemorrhoids   . Thyroid disease   . Osteoarthritis   . Seizures   . Depression   . Hyperlipidemia   . Wears glasses   . Cancer     skin  . Bruises easily   . Skin cancer     Surgeries: Procedure(s): OPEN REDUCTION INTERNAL FIXATION (ORIF) DISTAL RADIAL FRACTURE on 04/07/2013   Consultants (if any):    Discharged Condition: Improved  Hospital Course: Erica [name] is an 79 y.o. female who was admitted 04/07/2013 with a diagnosis listed above and went to the operating room on 04/07/2013 and underwent the above named procedures.  She was kept in-house for 2 doses of IV antibiotics and PT/OT evaluations to determine most appropriate and safe d/c plan.  Pt has decided upon home with PT/OT.  She was given perioperative antibiotics:  Anti-infectives   Start     Dose/Rate Route Frequency Ordered Stop   04/08/13 1000  ceFAZolin (ANCEF) IVPB 1 g/50 mL premix     1 g 100 mL/hr over 30 Minutes Intravenous Every 8 hours 04/08/13 0315 04/08/13 1846   04/08/13 0600  ceFAZolin (ANCEF) IVPB 2 g/50 mL premix  Status:  Discontinued     2 g 100 mL/hr over 30 Minutes Intravenous On call to O.R. 04/08/13 0316 04/08/13 0318   04/08/13 0330  ceFAZolin (ANCEF) IVPB 1 g/50 mL premix     1 g 100 mL/hr over 30 Minutes Intravenous NOW 04/08/13 0315 04/08/13 0400   04/07/13 1815  ceFAZolin (ANCEF) IVPB 1 g/50 mL premix     1 g 100 mL/hr over 30 Minutes Intravenous  Once 04/07/13 1805 04/07/13 1919    .  She was given sequential compression devices, early ambulation for DVT prophylaxis.  She benefited maximally from the hospital stay and  there were no complications.    Recent vital signs:  Filed Vitals:   04/09/13 1436  BP: 129/56  Pulse: 72  Temp: 98.1 F (36.7 C)  Resp: 16    Recent laboratory studies:  Lab Results  Component Value Date   HGB 14.3 04/07/2013   HGB 13.2 04/07/2013   HGB 13.0 02/06/2013   Lab Results  Component Value Date   WBC 9.2 04/07/2013   PLT 161 04/07/2013   Lab Results  Component Value Date   INR 1.02 05/09/2010   Lab Results  Component Value Date   NA 141 04/07/2013   K 4.0 04/07/2013   CL 99 04/07/2013   CO2 29 02/06/2013   BUN 13 04/07/2013   CREATININE 0.60 04/07/2013   GLUCOSE 108* 04/07/2013    Discharge Medications:     Medication List         acetaminophen 325 MG tablet  Commonly known as:  TYLENOL  Take 2 tablets (650 mg total) by mouth every 6 (six) hours as needed.     amLODipine 2.5 MG tablet  Commonly known as:  NORVASC  Take 2.5 mg by mouth daily.     B-complex with vitamin C tablet  Take 1 tablet by mouth daily. OTC  [facility] brand     Calcium + D3 600-200 MG-UNIT Tabs  Take 1 tablet by mouth daily.  fexofenadine 180 MG tablet  Commonly known as:  ALLEGRA  Take 180 mg by mouth daily.     furosemide 20 MG tablet  Commonly known as:  LASIX  Take 20 mg by mouth daily.     HYDROcodone-acetaminophen 5-325 MG per tablet  Commonly known as:  NORCO/VICODIN  Take 1 tablet by mouth every 4 (four) hours as needed.     levothyroxine 125 MCG tablet  Commonly known as:  SYNTHROID, LEVOTHROID  Take 125 mcg by mouth daily before breakfast.     multivitamin with minerals Tabs  Take 1 tablet by mouth daily.     PARoxetine 20 MG tablet  Commonly known as:  PAXIL  Take 20 mg by mouth daily.     phenytoin 100 MG ER capsule  Commonly known as:  DILANTIN  Take 200 mg by mouth at bedtime.     potassium chloride 10 MEQ tablet  Commonly known as:  K-DUR,KLOR-CON  Take 10 mEq by mouth daily.     traMADol 50 MG tablet  Commonly known as:  ULTRAM  Take 2 tablets  (100 mg total) by mouth every 8 (eight) hours as needed for pain.     Vitamin D3 2000 UNITS Tabs  Take 1 tablet by mouth daily.        Diagnostic Studies: Dg Wrist 2 Views Right  04/23/2013   *RADIOLOGY REPORT*  Clinical Data: Post ORIF of distal right radius fracture  RIGHT WRIST - 2 VIEW  Comparison: Right wrist radiographs - earlier same day  Findings: Three intraoperative radiographic images of the right wrist are provided for review.  Images demonstrate side plate fixation of previously noted comminuted, displaced distal radial metaphyseal fracture/dislocation Laurence Compton fracture).  Alignment now appears near anatomic.  Grossly unchanged appearance of minimally displaced fracture involving the tip of the ulnar styloid process.  Expected adjacent soft tissue swelling.  No radiopaque foreign body.  IMPRESSION: Post ORIF of the distal radius without evidence of complication.   Original Report Authenticated By: Tacey Ruiz, MD   Dg Wrist Complete Right  2013-04-23   *RADIOLOGY REPORT*  Clinical Data: Fall.  Right wrist pain.  Deformity.  RIGHT WRIST - COMPLETE 3+ VIEW  Comparison: None.  Findings: Dorsal Barton fracture observed with moderate comminution and intra-articular extension, and dorsal displacement of distal radial fracture fragments along with the carpus.  Small transverse fracture of the tip of the ulnar styloid. Regional bony demineralization observed.  Degenerative spurring of the first carpometacarpal articulation.  The bony demineralization and difficulty with positioning cause mild obscuration of portions of the distal carpal row and reduced sensitivity for carpal fracture.  IMPRESSION:  1.  Dorsal Barton fracture with comminuted radial intra-articular component. 2.  Small fracture of the tip of the ulnar styloid.   Original Report Authenticated By: Gaylyn Rong, M.D.   Dg C-arm 61-120 Min-no Report  April 23, 2013   CLINICAL DATA: Right distal radius fracture   C-ARM 61-120 MINUTES   Fluoroscopy was utilized by the requesting physician.  No radiographic  interpretation.     Disposition: 06-Home-Health Care Svc        Follow-up Information   Schedule an appointment as soon as possible for a visit with Janee Morn, Ainhoa Rallo A., MD. (10-15 days following surgery)    Contact information:   [address] [place] [address] [phone]        Signed: Danae Oland A. 04/09/2013, 6:39 PM

## 2013-04-09 NOTE — Progress Notes (Signed)
Patient reported that she cannot afford to pay privately and would like to go home with home health. SW informed CM of patient's choice. Clinical Social Worker will sign off for now as social work intervention is no longer needed. Please consult [place] again if new need arises.   Sabino Niemann, MSW,  [phone]

## 2013-04-10 ENCOUNTER — Encounter (HOSPITAL_COMMUNITY): Payer: Self-pay | Admitting: Orthopedic Surgery

## 2013-04-10 NOTE — Progress Notes (Signed)
Pt discharged to home accompanied by family. Discharge instructions and rx given and explained and pt stated understanding. Pts IV was removed. Pt left unit in a stable condition via wheelchair. 

## 2013-04-10 NOTE — Progress Notes (Signed)
Physical Therapy Treatment Patient Details Name: Erica [name] MRN: [id number] DOB: 08-Sep-1917 Today's Date: 04/10/2013 Time: 4098-1191 PT Time Calculation (min): 31 min  PT Assessment / Plan / Recommendation  PT Comments   Continuing progress; Agree with plan for home with HHservices; requesting [facility] for [name] -- notified case mgr   Follow Up Recommendations  Supervision/Assistance - 24 hour;Home health PT     Does the patient have the potential to tolerate intense rehabilitation     Barriers to Discharge        Equipment Recommendations  Other (comment) (platform RW)    Recommendations for Other Services    Frequency Min 5X/week   Progress towards PT Goals Progress towards PT goals: Progressing toward goals  Plan Current plan remains appropriate    Precautions / Restrictions Precautions Precautions: Fall;Cervical Required Braces or Orthoses: Other Brace/Splint Other Brace/Splint: Hard collar Restrictions Weight Bearing Restrictions: Yes RUE Weight Bearing: Weight bear through elbow only Other Position/Activity Restrictions: No lifting, gripping or grasping >2 lbs with Right hand. Allowed to use RUE for light ADL tasks.   Pertinent Vitals/Pain no apparent distress     Mobility  Bed Mobility Bed Mobility: Supine to Sit Supine to Sit: 4: Min assist Details for Bed Mobility Assistance: Cues for technique and prec; less need for assist to elevate shoulders  Transfers Transfers: Sit to Stand;Stand to Sit Sit to Stand: From chair/3-in-1;From toilet;With upper extremity assist;4: Min guard Stand to Sit: To chair/3-in-1;To toilet;With armrests;With upper extremity assist;4: Min guard Details for Transfer Assistance: Pt using LUE for support. Requires physical assist to gently translate weight over feet; cues to initiate with anterior lean Ambulation/Gait Ambulation/Gait Assistance: 5: Supervision Ambulation Distance (Feet): 120 Feet Assistive device:  Right platform walker Ambulation/Gait Assistance Details: No loss of balance noted; cues to keep weight forward as she has tended to lose balance posteriorly Gait Pattern: Decreased stride length    Exercises     PT Diagnosis:    PT Problem List:   PT Treatment Interventions:     PT Goals (current goals can now be found in the care plan section) Acute Rehab PT Goals Patient Stated Goal: to return home and live her life PT Goal Formulation: With patient Time For Goal Achievement: 04/22/13 Potential to Achieve Goals: Good  Visit Information  Last PT Received On: 04/10/13 Assistance Needed: +1 History of Present Illness: Fall on Outstretched hand resulting in R radius fx; recent C2 fx; in hard collar    Subjective Data  Subjective: looking forward to going home Patient Stated Goal: to return home and live her life   Cognition  Cognition Arousal/Alertness: Awake/alert Behavior During Therapy: WFL for tasks assessed/performed Overall Cognitive Status: Within Functional Limits for tasks assessed    Balance  Balance Balance Assessed: Yes  End of Session PT - End of Session Activity Tolerance: Patient tolerated treatment well Patient left: in chair;with call bell/phone within reach;with family/visitor present Nurse Communication: Mobility status   GP Functional Assessment Tool Used: Clinical Judgement Functional Limitation: Mobility: Walking and moving around Mobility: Walking and Moving Around Current Status [phone]): At least 1 percent but less than 20 percent impaired, limited or restricted Mobility: Walking and Moving Around Goal Status [phone]): 0 percent impaired, limited or restricted Mobility: Walking and Moving Around Discharge Status [phone]): At least 1 percent but less than 20 percent impaired, limited or restricted   Van Clines [facility] [place], [place] [phone]  04/10/2013, 12:24 PM

## 2014-05-28 ENCOUNTER — Emergency Department (HOSPITAL_COMMUNITY): Payer: Medicare Other

## 2014-05-28 ENCOUNTER — Inpatient Hospital Stay (HOSPITAL_COMMUNITY)
Admission: EM | Admit: 2014-05-28 | Discharge: 2014-05-31 | DRG: 291 | Disposition: A | Discharge status: Home Health | Payer: Medicare Other | Attending: Family Medicine | Admitting: Family Medicine

## 2014-05-28 ENCOUNTER — Encounter (HOSPITAL_COMMUNITY): Payer: Self-pay | Admitting: Emergency Medicine

## 2014-05-28 DIAGNOSIS — I10 Essential (primary) hypertension: Secondary | ICD-10-CM | POA: Diagnosis present

## 2014-05-28 DIAGNOSIS — Z9181 History of falling: Secondary | ICD-10-CM

## 2014-05-28 DIAGNOSIS — Z96659 Presence of unspecified artificial knee joint: Secondary | ICD-10-CM

## 2014-05-28 DIAGNOSIS — J9601 Acute respiratory failure with hypoxia: Secondary | ICD-10-CM | POA: Diagnosis present

## 2014-05-28 DIAGNOSIS — Z85828 Personal history of other malignant neoplasm of skin: Secondary | ICD-10-CM | POA: Diagnosis not present

## 2014-05-28 DIAGNOSIS — I2789 Other specified pulmonary heart diseases: Secondary | ICD-10-CM | POA: Diagnosis present

## 2014-05-28 DIAGNOSIS — E785 Hyperlipidemia, unspecified: Secondary | ICD-10-CM | POA: Diagnosis present

## 2014-05-28 DIAGNOSIS — J9600 Acute respiratory failure, unspecified whether with hypoxia or hypercapnia: Secondary | ICD-10-CM | POA: Diagnosis present

## 2014-05-28 DIAGNOSIS — G40909 Epilepsy, unspecified, not intractable, without status epilepticus: Secondary | ICD-10-CM

## 2014-05-28 DIAGNOSIS — I5041 Acute combined systolic (congestive) and diastolic (congestive) heart failure: Principal | ICD-10-CM

## 2014-05-28 DIAGNOSIS — Z66 Do not resuscitate: Secondary | ICD-10-CM | POA: Diagnosis present

## 2014-05-28 DIAGNOSIS — H547 Unspecified visual loss: Secondary | ICD-10-CM | POA: Diagnosis present

## 2014-05-28 DIAGNOSIS — I509 Heart failure, unspecified: Secondary | ICD-10-CM | POA: Diagnosis present

## 2014-05-28 DIAGNOSIS — Z96649 Presence of unspecified artificial hip joint: Secondary | ICD-10-CM

## 2014-05-28 DIAGNOSIS — I071 Rheumatic tricuspid insufficiency: Secondary | ICD-10-CM

## 2014-05-28 DIAGNOSIS — M1990 Unspecified osteoarthritis, unspecified site: Secondary | ICD-10-CM | POA: Diagnosis present

## 2014-05-28 DIAGNOSIS — I4891 Unspecified atrial fibrillation: Secondary | ICD-10-CM | POA: Diagnosis present

## 2014-05-28 DIAGNOSIS — R0602 Shortness of breath: Secondary | ICD-10-CM | POA: Diagnosis present

## 2014-05-28 DIAGNOSIS — J810 Acute pulmonary edema: Secondary | ICD-10-CM | POA: Diagnosis present

## 2014-05-28 DIAGNOSIS — E039 Hypothyroidism, unspecified: Secondary | ICD-10-CM | POA: Diagnosis present

## 2014-05-28 DIAGNOSIS — I38 Endocarditis, valve unspecified: Secondary | ICD-10-CM

## 2014-05-28 DIAGNOSIS — Z8249 Family history of ischemic heart disease and other diseases of the circulatory system: Secondary | ICD-10-CM | POA: Diagnosis not present

## 2014-05-28 DIAGNOSIS — I272 Pulmonary hypertension, unspecified: Secondary | ICD-10-CM

## 2014-05-28 HISTORY — DX: Unspecified visual loss: H54.7

## 2014-05-28 HISTORY — DX: Rheumatic tricuspid insufficiency: I07.1

## 2014-05-28 HISTORY — DX: Dizziness and giddiness: R42

## 2014-05-28 HISTORY — DX: Unspecified dislocation of right hip, initial encounter: S73.004A

## 2014-05-28 HISTORY — DX: Unspecified atrial fibrillation: I48.91

## 2014-05-28 HISTORY — DX: Bifascicular block: I45.2

## 2014-05-28 HISTORY — DX: Chronic combined systolic (congestive) and diastolic (congestive) heart failure: I50.42

## 2014-05-28 HISTORY — DX: Essential (primary) hypertension: I10

## 2014-05-28 HISTORY — DX: Pulmonary hypertension, unspecified: I27.20

## 2014-05-28 HISTORY — DX: Nonrheumatic mitral (valve) insufficiency: I34.0

## 2014-05-28 HISTORY — DX: Unspecified displaced fracture of second cervical vertebra, initial encounter for closed fracture: S12.100A

## 2014-05-28 HISTORY — DX: Hypothyroidism, unspecified: E03.9

## 2014-05-28 LAB — CBC WITH DIFFERENTIAL/PLATELET
Basophils Absolute: 0 10*3/uL (ref 0.0–0.1)
Basophils Relative: 0 % (ref 0–1)
EOS PCT: 3 % (ref 0–5)
Eosinophils Absolute: 0.2 10*3/uL (ref 0.0–0.7)
HEMATOCRIT: 43.5 % (ref 36.0–46.0)
HEMOGLOBIN: 14.5 g/dL (ref 12.0–15.0)
LYMPHS ABS: 0.9 10*3/uL (ref 0.7–4.0)
LYMPHS PCT: 17 % (ref 12–46)
MCH: 31.7 pg (ref 26.0–34.0)
MCHC: 33.3 g/dL (ref 30.0–36.0)
MCV: 95 fL (ref 78.0–100.0)
MONO ABS: 0.4 10*3/uL (ref 0.1–1.0)
MONOS PCT: 7 % (ref 3–12)
NEUTROS ABS: 4.1 10*3/uL (ref 1.7–7.7)
Neutrophils Relative %: 73 % (ref 43–77)
Platelets: 170 10*3/uL (ref 150–400)
RBC: 4.58 MIL/uL (ref 3.87–5.11)
RDW: 15.2 % (ref 11.5–15.5)
WBC: 5.6 10*3/uL (ref 4.0–10.5)

## 2014-05-28 LAB — TSH: TSH: 12.62 u[IU]/mL — AB (ref 0.350–4.500)

## 2014-05-28 LAB — TROPONIN I
Troponin I: <0.3 ng/mL (ref <0.30)
Troponin I: <0.3 ng/mL (ref <0.30)
Troponin I: <0.3 ng/mL (ref <0.30)

## 2014-05-28 LAB — BASIC METABOLIC PANEL
ANION GAP: 12 (ref 5–15)
BUN: 14 mg/dL (ref 6–23)
CALCIUM: 8.8 mg/dL (ref 8.4–10.5)
CHLORIDE: 104 meq/L (ref 96–112)
CO2: 27 meq/L (ref 19–32)
CREATININE: 0.56 mg/dL (ref 0.50–1.10)
GFR calc Af Amer: 90 mL/min — ABNORMAL LOW (ref >90)
GFR calc non Af Amer: 77 mL/min — ABNORMAL LOW (ref >90)
GLUCOSE: 105 mg/dL — AB (ref 70–99)
Potassium: 4.1 mEq/L (ref 3.7–5.3)
Sodium: 143 mEq/L (ref 137–147)

## 2014-05-28 LAB — MRSA PCR SCREENING: MRSA BY PCR: POSITIVE — AB

## 2014-05-28 LAB — PRO B NATRIURETIC PEPTIDE: Pro B Natriuretic peptide (BNP): 1935 pg/mL — ABNORMAL HIGH (ref 0–450)

## 2014-05-28 MED ORDER — APIXABAN 5 MG PO TABS
2.5000 mg | ORAL_TABLET | Freq: Two times a day (BID) | ORAL | Status: DC
  Administered 2014-05-28 – 2014-05-31 (×7): 2.5 mg via ORAL
  Filled 2014-05-28 (×8): qty 1

## 2014-05-28 MED ORDER — FUROSEMIDE 10 MG/ML IJ SOLN
40.0000 mg | Freq: Two times a day (BID) | INTRAMUSCULAR | Status: DC
  Administered 2014-05-28 – 2014-05-29 (×2): 40 mg via INTRAVENOUS
  Filled 2014-05-28 (×2): qty 4

## 2014-05-28 MED ORDER — FUROSEMIDE 10 MG/ML IJ SOLN
20.0000 mg | Freq: Once | INTRAMUSCULAR | Status: AC
  Administered 2014-05-28: 20 mg via INTRAVENOUS
  Filled 2014-05-28: qty 2

## 2014-05-28 MED ORDER — DILTIAZEM HCL 100 MG IV SOLR
5.0000 mg/h | INTRAVENOUS | Status: DC
  Administered 2014-05-28: 5 mg/h via INTRAVENOUS
  Filled 2014-05-28: qty 100

## 2014-05-28 MED ORDER — CHLORHEXIDINE GLUCONATE CLOTH 2 % EX PADS
6.0000 | MEDICATED_PAD | Freq: Every day | CUTANEOUS | Status: DC
  Administered 2014-05-29 – 2014-05-31 (×3): 6 via TOPICAL

## 2014-05-28 MED ORDER — POTASSIUM CHLORIDE CRYS ER 20 MEQ PO TBCR
20.0000 meq | EXTENDED_RELEASE_TABLET | Freq: Two times a day (BID) | ORAL | Status: DC
  Administered 2014-05-28 – 2014-05-30 (×6): 20 meq via ORAL
  Filled 2014-05-28 (×6): qty 1

## 2014-05-28 MED ORDER — ONDANSETRON HCL 4 MG/2ML IJ SOLN
INTRAMUSCULAR | Status: AC
  Administered 2014-05-28: 4 mg
  Filled 2014-05-28: qty 2

## 2014-05-28 MED ORDER — ASPIRIN 81 MG PO CHEW
81.0000 mg | CHEWABLE_TABLET | Freq: Once | ORAL | Status: AC
Start: 2014-05-28 — End: 2014-05-28
  Administered 2014-05-28: 81 mg via ORAL
  Filled 2014-05-28: qty 1

## 2014-05-28 MED ORDER — SODIUM CHLORIDE 0.9 % IJ SOLN
3.0000 mL | INTRAMUSCULAR | Status: DC | PRN
  Administered 2014-05-28: 3 mL via INTRAVENOUS

## 2014-05-28 MED ORDER — MUPIROCIN 2 % EX OINT
1.0000 "application " | TOPICAL_OINTMENT | Freq: Two times a day (BID) | CUTANEOUS | Status: DC
  Administered 2014-05-28 – 2014-05-31 (×6): 1 via NASAL
  Filled 2014-05-28: qty 22

## 2014-05-28 MED ORDER — NYSTATIN 100000 UNIT/GM EX CREA
TOPICAL_CREAM | Freq: Two times a day (BID) | CUTANEOUS | Status: DC
  Administered 2014-05-28: 1 via TOPICAL
  Administered 2014-05-29 – 2014-05-31 (×5): via TOPICAL
  Filled 2014-05-28 (×2): qty 15

## 2014-05-28 MED ORDER — MORPHINE SULFATE 2 MG/ML IJ SOLN
1.0000 mg | Freq: Once | INTRAMUSCULAR | Status: AC
  Administered 2014-05-28: 1 mg via INTRAVENOUS
  Filled 2014-05-28: qty 1

## 2014-05-28 MED ORDER — PAROXETINE HCL 20 MG PO TABS
20.0000 mg | ORAL_TABLET | Freq: Every day | ORAL | Status: DC
  Administered 2014-05-28 – 2014-05-31 (×4): 20 mg via ORAL
  Filled 2014-05-28 (×4): qty 1

## 2014-05-28 MED ORDER — CETYLPYRIDINIUM CHLORIDE 0.05 % MT LIQD
7.0000 mL | Freq: Two times a day (BID) | OROMUCOSAL | Status: DC
Start: 2014-05-28 — End: 2014-05-31
  Administered 2014-05-28 – 2014-05-30 (×5): 7 mL via OROMUCOSAL

## 2014-05-28 MED ORDER — LISINOPRIL 5 MG PO TABS
2.5000 mg | ORAL_TABLET | Freq: Every day | ORAL | Status: DC
  Administered 2014-05-28 – 2014-05-31 (×4): 2.5 mg via ORAL
  Filled 2014-05-28 (×4): qty 1

## 2014-05-28 MED ORDER — DILTIAZEM LOAD VIA INFUSION
10.0000 mg | Freq: Once | INTRAVENOUS | Status: AC
  Administered 2014-05-28: 10 mg via INTRAVENOUS
  Filled 2014-05-28: qty 10

## 2014-05-28 MED ORDER — SODIUM CHLORIDE 0.9 % IV SOLN: 250.0000 mL | INTRAVENOUS | Status: DC | PRN

## 2014-05-28 MED ORDER — LEVOTHYROXINE SODIUM 25 MCG PO TABS
125.0000 ug | ORAL_TABLET | Freq: Every day | ORAL | Status: DC
  Administered 2014-05-29: 125 ug via ORAL
  Filled 2014-05-28 (×2): qty 1

## 2014-05-28 MED ORDER — PHENYTOIN SODIUM EXTENDED 100 MG PO CAPS
100.0000 mg | ORAL_CAPSULE | Freq: Every day | ORAL | Status: DC
  Administered 2014-05-28 – 2014-05-30 (×3): 100 mg via ORAL
  Filled 2014-05-28 (×3): qty 1

## 2014-05-28 MED ORDER — ACETAMINOPHEN 325 MG PO TABS: 650.0000 mg | ORAL_TABLET | ORAL | Status: DC | PRN

## 2014-05-28 MED ORDER — SODIUM CHLORIDE 0.9 % IJ SOLN
3.0000 mL | Freq: Two times a day (BID) | INTRAMUSCULAR | Status: DC
  Administered 2014-05-28 – 2014-05-31 (×7): 3 mL via INTRAVENOUS

## 2014-05-28 MED ORDER — LIVING BETTER WITH HEART FAILURE BOOK
Freq: Once | Status: DC
  Filled 2014-05-28: qty 1

## 2014-05-28 MED ORDER — ONDANSETRON HCL 4 MG/2ML IJ SOLN
4.0000 mg | Freq: Four times a day (QID) | INTRAMUSCULAR | Status: DC | PRN
Start: 2014-05-28 — End: 2014-05-31
  Administered 2014-05-28: 4 mg via INTRAVENOUS
  Filled 2014-05-28: qty 2

## 2014-05-28 MED ORDER — ASPIRIN EC 81 MG PO TBEC
81.0000 mg | DELAYED_RELEASE_TABLET | Freq: Every day | ORAL | Status: DC
  Administered 2014-05-28 – 2014-05-30 (×3): 81 mg via ORAL
  Filled 2014-05-28 (×3): qty 1

## 2014-05-28 MED ORDER — DILTIAZEM HCL 30 MG PO TABS
30.0000 mg | ORAL_TABLET | Freq: Four times a day (QID) | ORAL | Status: DC
  Administered 2014-05-28 – 2014-05-29 (×3): 30 mg via ORAL
  Filled 2014-05-28 (×3): qty 1

## 2014-05-28 NOTE — ED Provider Notes (Signed)
CSN: [id number]     Arrival date & time 05/28/14  6720 History  This chart was scribed for Merryl Hacker, MD by Ludger Nutting, ED Scribe. This patient was seen in room APA18/APA18 and the patient's care was started 10:04 AM.    Chief Complaint  Patient presents with  . Shortness of Breath    The history is provided by the patient. No language interpreter was used.    HPI Comments: Erica [name] is a 80 y.o. female with past medical history of HTN, HLD  who presents to the Emergency Department complaining of 2 days of gradual onset, gradually worsening, constant SOB. She states her SOB is worse with laying flat. Patient has chronic, unchanged bilateral leg swelling for the past 1 year. No known history of heart failure. Patient states she is able to walk at baseline with a walker. She denies chest pain.   Denies any palpitations.  Past Medical History  Diagnosis Date  . Hypertension   . Hemorrhoids   . Thyroid disease   . Osteoarthritis   . Seizures   . Depression   . Hyperlipidemia   . Wears glasses   . Cancer     skin  . Bruises easily   . Skin cancer    Past Surgical History  Procedure Laterality Date  . Cataract extraction, bilateral    . Carpal tunnel release      left hand  . Shoulder arthroscopy  1998    right  . Total hip arthroplasty      right  . Total knee arthroplasty      right   . Rotator cuff repair      left shoulder  . Total knee arthroplasty      left   . Cholecystectomy    . Breast cyst excision      left  . Abdominal hysterectomy    . Tonsellectomy    . Skin lesion excision  05/25/2011  . Lesion removal  11/11/2011    Procedure: MINOR EXICISION OF LESION;  Surgeon: Shann Medal, MD;  Location: [facility];  Service: General;  Laterality: Right;  right leg  . Mass excision  12/21/2011    Procedure: MINOR EXCISION OF MASS;  Surgeon: Shann Medal, MD;  Location: [place];  Service: General;  Laterality:  Left;  excision of lesion left leg-3cm  . Open reduction internal fixation (orif) distal radial fracture Right 04/07/2013    Procedure: OPEN REDUCTION INTERNAL FIXATION (ORIF) DISTAL RADIAL FRACTURE;  Surgeon: Jolyn Nap, MD;  Location: [place];  Service: Orthopedics;  Laterality: Right;   Family History  Problem Relation Age of Onset  . Heart disease Father   . Heart disease Sister   . Cancer Brother     mouth and throat   History  Substance Use Topics  . Smoking status: Never Smoker   . Smokeless tobacco: Never Used  . Alcohol Use: No   OB History   Grav Para Term Preterm Abortions TAB SAB Ect Mult Living                 Review of Systems  Constitutional: Negative for fever.  Respiratory: Positive for shortness of breath. Negative for cough and chest tightness.   Cardiovascular: Positive for leg swelling. Negative for chest pain and palpitations.  Gastrointestinal: Negative for nausea, vomiting and abdominal pain.  Genitourinary: Negative for dysuria.  Neurological: Negative for headaches.  Psychiatric/Behavioral: Negative for confusion.  All other systems reviewed and are negative.     Allergies  Clarithromycin; Ditropan; Prednisone; and Vioxx  Home Medications   Prior to Admission medications   Medication Sig Start Date End Date Taking? Authorizing Provider  amLODipine (NORVASC) 2.5 MG tablet Take 2.5 mg by mouth daily.  05/28/11  Yes Historical Provider, MD  B Complex-C (B-COMPLEX WITH VITAMIN C) tablet Take 1 tablet by mouth daily. OTC  [facility] brand   Yes Historical Provider, MD  Cholecalciferol (VITAMIN D3) 2000 UNITS TABS Take 1 tablet by mouth daily.   Yes Historical Provider, MD  fexofenadine (ALLEGRA) 180 MG tablet Take 180 mg by mouth daily.   Yes Historical Provider, MD  furosemide (LASIX) 20 MG tablet Take 20 mg by mouth daily.   Yes Historical Provider, MD  levothyroxine (SYNTHROID, LEVOTHROID) 125 MCG tablet Take 125 mcg by mouth daily before breakfast.    Yes Historical Provider, MD  Multiple Vitamin (MULTIVITAMIN WITH MINERALS) TABS Take 1 tablet by mouth daily.   Yes Historical Provider, MD  PARoxetine (PAXIL) 20 MG tablet Take 20 mg by mouth daily.    Yes Historical Provider, MD  phenytoin (DILANTIN) 100 MG ER capsule Take 100 mg by mouth at bedtime.    Yes Historical Provider, MD  potassium chloride (K-DUR,KLOR-CON) 10 MEQ tablet Take 10 mEq by mouth daily.   Yes Historical Provider, MD  acetaminophen (TYLENOL) 325 MG tablet Take 2 tablets (650 mg total) by mouth every 6 (six) hours as needed. 02/06/13   Domenic Polite, MD   BP 156/113  Pulse 104  Temp(Src) 97.7 F (36.5 C) (Oral)  Resp 28  Ht 5\' 6"  (1.676 m)  Wt 150 lb (68.04 kg)  BMI 24.22 kg/m2  SpO2 88% Physical Exam  Nursing note and vitals reviewed. Constitutional: She is oriented to person, place, and time.  Elderly, no acute distress  HENT:  Head: Normocephalic and atraumatic.  Eyes: Pupils are equal, round, and reactive to light.  Neck: Neck supple. No JVD present.  Cardiovascular: Normal heart sounds.   No murmur heard. Tachycardia, irregular rhythm  Pulmonary/Chest: Effort normal. No respiratory distress. She has no wheezes.  Coarse breath sounds bilaterally, crackles noted left lung base  Abdominal: Soft. Bowel sounds are normal. There is no tenderness. There is no rebound.  Musculoskeletal:  2+ bilateral lower extremity edema  Neurological: She is alert and oriented to person, place, and time.  Skin: Skin is warm and dry.  Scars over the bilateral legs with chronic skin changes, bilateral knee replacement scars  Psychiatric: She has a normal mood and affect.    ED Course  Procedures (including critical care time)  CRITICAL CARE Performed by: Thayer Jew, F   Total critical care time: 35 min  Critical care time was exclusive of separately billable procedures and treating other patients.  Critical care was necessary to treat or prevent imminent or  life-threatening deterioration.  Critical care was time spent personally by me on the following activities: development of treatment plan with patient and/or surrogate as well as nursing, discussions with consultants, evaluation of patient's response to treatment, examination of patient, obtaining history from patient or surrogate, ordering and performing treatments and interventions, ordering and review of laboratory studies, ordering and review of radiographic studies, pulse oximetry and re-evaluation of patient's condition.  DIAGNOSTIC STUDIES: Oxygen Saturation is 89% on [place], low by my interpretation.    COORDINATION OF CARE: 10:09 AM Discussed treatment plan with pt at bedside and pt agreed to plan.  Labs Review Labs Reviewed  BASIC METABOLIC PANEL - Abnormal; Notable for the following:    Glucose, Bld 105 (*)    GFR calc non Af Amer 77 (*)    GFR calc Af Amer 90 (*)    All other components within normal limits  PRO B NATRIURETIC PEPTIDE - Abnormal; Notable for the following:    Pro B Natriuretic peptide (BNP) 1935.0 (*)    All other components within normal limits  CBC WITH DIFFERENTIAL  TROPONIN I    Imaging Review Dg Chest Port 1 View  05/28/2014   CLINICAL DATA:  Shortness of breath.  EXAM: PORTABLE CHEST - 1 VIEW  COMPARISON:  02/04/2013  FINDINGS: Single view of the chest demonstrates diffuse parenchymal lung densities with peribronchial thickening. These findings are compatible with pulmonary edema. Densities in the left lower chest suggest a moderate-sized left pleural effusion. The heart is obscured by the pleural fluid but it appears to be upper limits of normal. Atherosclerotic calcifications at the aortic arch.  IMPRESSION: Diffuse pulmonary edema with a moderate-sized left pleural effusion.   Electronically Signed   By: Markus Daft M.D.   On: 05/28/2014 11:09     EKG Interpretation   Date/Time:  Tuesday May 28 2014 09:50:06 EDT Ventricular Rate:  113 PR Interval:     QRS Duration: 113 QT Interval:  330 QTC Calculation: 452 R Axis:   -54 Text Interpretation:  Atrial fibrillation Incomplete right bundle branch  block Inferior infarct, old Anterolateral infarct, age indeterminate afib  when compared to prior Confirmed by Rosaleen Mazer  MD, [place] ([id number]) on  05/28/2014 10:02:15 AM      MDM   Final diagnoses:  Atrial fibrillation with RVR  Acute pulmonary edema    Patient presents with progressive shortness of breath and orthopnea. He is nontoxic on exam.  Initial vital signs notable for heart rate 120s and satting 89% on room air. Patient was placed on nasal cannula. EKG since atrial fibrillation with RVR. This appears new for this patient.  She also appears to light inflated. No known history of heart failure. BMP is elevated and chest x-ray shows pulmonary edema with a left-sided pleural effusion. Patient will be given 20 mg IV Lasix. She takes Lasix at home.  Patient was started on a diltiazem drip for rate control. Currently rate is 105.  Will admit for further workup.  I personally performed the services described in this documentation, which was scribed in my presence. The recorded information has been reviewed and is accurate.   Merryl Hacker, MD 05/28/14 [phone]

## 2014-05-28 NOTE — H&P (Signed)
[facility] History and Physical  Tarena LUAN URBANI WER:[id number] DOB: [birth year] DOA: 05/28/2014  Referring physician: Dr. Dina Rich, ER physician PCP: Woody Seller, MD   Chief Complaint: Shortness of breath  HPI: Erica [name] is a 80 y.o. female who is relatively independent, ambulates with the assistance of a walker. Patient reports that she had progressive shortness of breath which began yesterday. Her symptoms progressively got worse to the point that she got up this morning and was unable to breathe. She describes paroxysmal nocturnal dyspnea as well as orthopnea. She denies any chest pain. No palpitations. She does endorse lower extremity edema. She's not had any fever, she has an occasional cough. She's noticed increased urination. She's not had any vomiting or diarrhea. She was brought to the emergency room where she was noted to be in rapid atrial fibrillation. She was also noted to be hypoxic in the 80s on room air and chest x-ray showed pulmonary edema. She's being admitted for further treatment   Review of Systems:  Pertinent positives as per history of present illness, otherwise negative.   Past Medical History  Diagnosis Date  . Hypertension   . Hemorrhoids   . Thyroid disease   . Osteoarthritis   . Seizures   . Depression   . Hyperlipidemia   . Wears glasses   . Cancer     skin  . Bruises easily   . Skin cancer    Past Surgical History  Procedure Laterality Date  . Cataract extraction, bilateral    . Carpal tunnel release      left hand  . Shoulder arthroscopy  1998    right  . Total hip arthroplasty      right  . Total knee arthroplasty      right   . Rotator cuff repair      left shoulder  . Total knee arthroplasty      left   . Cholecystectomy    . Breast cyst excision      left  . Abdominal hysterectomy    . Tonsellectomy    . Skin lesion excision  05/25/2011  . Lesion removal  11/11/2011    Procedure: MINOR EXICISION OF  LESION;  Surgeon: Shann Medal, MD;  Location: [facility];  Service: General;  Laterality: Right;  right leg  . Mass excision  12/21/2011    Procedure: MINOR EXCISION OF MASS;  Surgeon: Shann Medal, MD;  Location: [facility];  Service: General;  Laterality: Left;  excision of lesion left leg-3cm  . Open reduction internal fixation (orif) distal radial fracture Right 04/07/2013    Procedure: OPEN REDUCTION INTERNAL FIXATION (ORIF) DISTAL RADIAL FRACTURE;  Surgeon: Jolyn Nap, MD;  Location: [facility];  Service: Orthopedics;  Laterality: Right;   Social History:  reports that she has never smoked. She has never used smokeless tobacco. She reports that she does not drink alcohol or use illicit drugs.  Allergies  Allergen Reactions  . Clarithromycin Other (See Comments)    "made me ill"-reaction years ago  . Ditropan [Oxybutynin Chloride] Itching and Other (See Comments)    Constipation  . Prednisone Other (See Comments)    Stomach pain & dizziness  . Vioxx [Rofecoxib] Other (See Comments)    dizziness    Family History  Problem Relation Age of Onset  . Heart disease Father   . Heart disease Sister   . Cancer Brother     mouth and throat  Prior to Admission medications   Medication Sig Start Date End Date Taking? Authorizing Provider  amLODipine (NORVASC) 2.5 MG tablet Take 2.5 mg by mouth daily.  05/28/11  Yes Historical Provider, MD  B Complex-C (B-COMPLEX WITH VITAMIN C) tablet Take 1 tablet by mouth daily. OTC  [facility] brand   Yes Historical Provider, MD  Cholecalciferol (VITAMIN D3) 2000 UNITS TABS Take 1 tablet by mouth daily.   Yes Historical Provider, MD  fexofenadine (ALLEGRA) 180 MG tablet Take 180 mg by mouth daily.   Yes Historical Provider, MD  furosemide (LASIX) 20 MG tablet Take 20 mg by mouth daily.   Yes Historical Provider, MD  levothyroxine (SYNTHROID, LEVOTHROID) 125 MCG tablet Take 125 mcg by mouth daily before breakfast.   Yes  Historical Provider, MD  Multiple Vitamin (MULTIVITAMIN WITH MINERALS) TABS Take 1 tablet by mouth daily.   Yes Historical Provider, MD  PARoxetine (PAXIL) 20 MG tablet Take 20 mg by mouth daily.    Yes Historical Provider, MD  phenytoin (DILANTIN) 100 MG ER capsule Take 100 mg by mouth at bedtime.    Yes Historical Provider, MD  potassium chloride (K-DUR,KLOR-CON) 10 MEQ tablet Take 10 mEq by mouth daily.   Yes Historical Provider, MD  acetaminophen (TYLENOL) 325 MG tablet Take 2 tablets (650 mg total) by mouth every 6 (six) hours as needed. 02/06/13   Domenic Polite, MD   Physical Exam: Filed Vitals:   05/28/14 1330 05/28/14 1345 05/28/14 1400 05/28/14 1415  BP: 144/82 134/80  131/82  Pulse:  141 109 84  Temp:      TempSrc:      Resp: 31 30 33 32  Height:      Weight:      SpO2:  87% 91% 92%    Wt Readings from Last 3 Encounters:  05/28/14 68.04 kg (150 lb)  04/07/13 64.864 kg (143 lb)  04/07/13 64.864 kg (143 lb)    General:  Appears calm and comfortable Eyes: PERRL, normal lids, irises & conjunctiva ENT: grossly normal hearing, lips & tongue Neck: no LAD, masses or thyromegaly Cardiovascular: S1, S2, irregular. Once 2+ LE edema. Telemetry: Rapid atrial fibrillation Respiratory: Crackles bilaterally. Normal respiratory effort. Abdomen: soft, ntnd Musculoskeletal: grossly normal tone BUE/BLE Psychiatric: grossly normal mood and affect, speech fluent and appropriate Neurologic: grossly non-focal.          Labs on Admission:  Basic Metabolic Panel:  Recent Labs Lab 05/28/14 1000  NA 143  K 4.1  CL 104  CO2 27  GLUCOSE 105*  BUN 14  CREATININE 0.56  CALCIUM 8.8   Liver Function Tests: No results found for this basename: AST, ALT, ALKPHOS, BILITOT, PROT, ALBUMIN,  in the last 168 hours No results found for this basename: LIPASE, AMYLASE,  in the last 168 hours No results found for this basename: AMMONIA,  in the last 168 hours CBC:  Recent Labs Lab  05/28/14 1000  WBC 5.6  NEUTROABS 4.1  HGB 14.5  HCT 43.5  MCV 95.0  PLT 170   Cardiac Enzymes:  Recent Labs Lab 05/28/14 1000  TROPONINI <0.30    BNP (last 3 results)  Recent Labs  05/28/14 1000  PROBNP 1935.0*   CBG: No results found for this basename: GLUCAP,  in the last 168 hours  Radiological Exams on Admission: Dg Chest Port 1 View  05/28/2014   CLINICAL DATA:  Shortness of breath.  EXAM: PORTABLE CHEST - 1 VIEW  COMPARISON:  02/04/2013  FINDINGS: Single view  of the chest demonstrates diffuse parenchymal lung densities with peribronchial thickening. These findings are compatible with pulmonary edema. Densities in the left lower chest suggest a moderate-sized left pleural effusion. The heart is obscured by the pleural fluid but it appears to be upper limits of normal. Atherosclerotic calcifications at the aortic arch.  IMPRESSION: Diffuse pulmonary edema with a moderate-sized left pleural effusion.   Electronically Signed   By: Markus Daft M.D.   On: 05/28/2014 11:09    EKG: Independently reviewed. Rapid atrial fibrillation  Assessment/Plan Active Problems:   Hypertension   Hypothyroidism   Atrial fibrillation with RVR   Acute pulmonary edema   Acute CHF   Acute respiratory failure with hypoxia   1. Rapid atrial fibrillation. Patient is currently on a Cardizem infusion. We will try to wean her off to start oral Cardizem. TSH has been sent. We'll cycle cardiac markers. Her chads score is 3 based on age, CHF and hypertension. She reports only one fall in over a year. She is otherwise functional. We will start the patient on low-dose Eliquis 2. Acute CHF. Start the patient on intravenous Lasix. Check cardiac markers and a 2-D echocardiogram to assess LV function. We'll hold off on starting beta blocker and aspirin she is acutely decompensated. 3. Acute respiratory failure, related to #2. We'll try to wean off oxygen as tolerated 4. Hypothyroidism. Continue  Synthroid 5. Seizure disorder. Continue Dilantin   Code Status: DNR DVT Prophylaxis: started on eliquis Family Communication: discussed with patient Disposition Plan: Discharge home once improved  Time spent: 78mins  MEMON,JEHANZEB [facility] Pager [phone]  **Disclaimer: This note may have been dictated with voice recognition software. Similar sounding words can inadvertently be transcribed and this note may contain transcription errors which may not have been corrected upon publication of note.**

## 2014-05-28 NOTE — Progress Notes (Signed)
Pt states she cannot breathe. O2 sat reading from 89-91%. Mid level called. Order given for lasix 20 mg and Morphine 1 mg IV. Will continue to monitor.

## 2014-05-28 NOTE — ED Notes (Signed)
Patient with c/o shortness of breath that started Sunday. Patient in no respiratory distress at present. Alert/oriented x 4. C/o leg swelling as well. Denies chest pain.

## 2014-05-28 NOTE — Progress Notes (Signed)
Talked with patient about use of foley catheter. Wants to keep catheter out for now. Explained that we could do that but if she starts to get more SOB while using the bedpan we would place one per Dr. Blythe Stanford order. Patient agreeable.

## 2014-05-28 NOTE — ED Notes (Signed)
Assisted patient with bedside commode. Stand by assist

## 2014-05-29 ENCOUNTER — Encounter (HOSPITAL_COMMUNITY): Payer: Self-pay | Admitting: Family Medicine

## 2014-05-29 DIAGNOSIS — I272 Pulmonary hypertension, unspecified: Secondary | ICD-10-CM

## 2014-05-29 DIAGNOSIS — I38 Endocarditis, valve unspecified: Secondary | ICD-10-CM

## 2014-05-29 DIAGNOSIS — I369 Nonrheumatic tricuspid valve disorder, unspecified: Secondary | ICD-10-CM

## 2014-05-29 LAB — BASIC METABOLIC PANEL
ANION GAP: 10 (ref 5–15)
BUN: 10 mg/dL (ref 6–23)
CO2: 31 meq/L (ref 19–32)
CREATININE: 0.63 mg/dL (ref 0.50–1.10)
Calcium: 8.4 mg/dL (ref 8.4–10.5)
Chloride: 99 mEq/L (ref 96–112)
GFR calc Af Amer: 86 mL/min — ABNORMAL LOW (ref >90)
GFR, EST NON AFRICAN AMERICAN: 74 mL/min — AB (ref >90)
Glucose, Bld: 135 mg/dL — ABNORMAL HIGH (ref 70–99)
Potassium: 4 mEq/L (ref 3.7–5.3)
SODIUM: 140 meq/L (ref 137–147)

## 2014-05-29 LAB — TROPONIN I

## 2014-05-29 LAB — GLUCOSE, CAPILLARY: GLUCOSE-CAPILLARY: 113 mg/dL — AB (ref 70–99)

## 2014-05-29 MED ORDER — LEVOTHYROXINE SODIUM 75 MCG PO TABS
150.0000 ug | ORAL_TABLET | Freq: Every day | ORAL | Status: DC
  Administered 2014-05-30 – 2014-05-31 (×2): 150 ug via ORAL
  Filled 2014-05-29 (×2): qty 2

## 2014-05-29 MED ORDER — FUROSEMIDE 10 MG/ML IJ SOLN
20.0000 mg | Freq: Two times a day (BID) | INTRAMUSCULAR | Status: DC
  Administered 2014-05-29 – 2014-05-30 (×2): 20 mg via INTRAVENOUS
  Filled 2014-05-29 (×3): qty 2

## 2014-05-29 MED ORDER — METOPROLOL SUCCINATE ER 25 MG PO TB24
25.0000 mg | ORAL_TABLET | Freq: Every day | ORAL | Status: DC
  Administered 2014-05-29 – 2014-05-30 (×2): 25 mg via ORAL
  Filled 2014-05-29 (×2): qty 1

## 2014-05-29 NOTE — Progress Notes (Signed)
PROGRESS NOTE  Erica [name] EQA:[id number] DOB: Jun 05, 1917 DOA: 05/28/2014 PCP: Woody Seller, MD  Summary: 80 year old woman with no report history of cardiac disease who presented with progressive shortness of breath, dyspnea on exertion, paroxysmal nocturnal dyspnea and orthopnea. She was admitted for atrial fibrillation with rapid ventricular response, acute hypoxic respiratory failure and pulmonary edema.  Assessment/Plan: 1. Atrial fibrillation with rapid ventricular response, suspect secondary to valvular disease. CHADS-Vasc 5. Rate control seems improved today, slightly. 2. Acute hypoxic respiratory failure, secondary to atrial fibrillation and acute diastolic heart failure. 3. Acute systolic/diastolic congestive heart failure, clinically improved with evidence of decreased edema bilateral lower extremities (skin wrinkling). Good urine output. EKG without acute changes, right bundle branch block pattern appears old. Suspect related to chronic disease rather than recent acute event. 4. Left-sided pleural effusion. 5. Moderate tricuspid regurgitation, moderate to severe pulmonary hypertension. 6. History of seizures. 7. Hypothyroidism. Check TSH. Has not had replacement adjusted lately.   Overall clinically improved. Plan to change from oral Cardizem to beta blocker for rate control given borderline systolic function. Continue diuresis. Dr. Roderic Palau discussed anticoagulation with patient and decided upon Eliquis.  Followup chest x-ray in the morning for followup effusion and assess response to diuresis.  Wean oxygen as tolerated.  Increase levothyroxine.  Plan outpatient cardiology followup.  Code Status: DNR DVT prophylaxis: Eliquis Family Communication: none prseent Disposition Plan: home, likely 1-2 days  Murray Hodgkins, MD  [facility]  Pager [phone] If 7PM-7AM, please contact night-coverage at [email or web address], password [facility] 05/29/2014, 9:59 AM  LOS: 1  day   Consultants:    Procedures:  2-D Echocardiogram: Moderate LVH with LVEF 40-45%, diffuse hypokinesis, possible grade 2 diastolic dysfunction, severe left atrial enlargement, moderate tricuspid regurgitation with moderate to severe pulmonary hypertension.  Antibiotics:    HPI/Subjective: Feels better this morning. Breathing better. No pain. Some nausea.  Objective: Filed Vitals:   05/29/14 0500 05/29/14 0511 05/29/14 0700 05/29/14 0939  BP:  113/59  116/85  Pulse:      Temp:   97.6 F (36.4 C)   TempSrc:   Oral   Resp:      Height:      Weight: 77.4 kg (170 lb 10.2 oz)     SpO2:        Intake/Output Summary (Last 24 hours) at 05/29/14 0959 Last data filed at 05/29/14 0500  Gross per 24 hour  Intake  69.25 ml  Output   2750 ml  Net -2680.75 ml     Filed Weights   05/28/14 0949 05/28/14 1510 05/29/14 0500  Weight: 68.04 kg (150 lb) 77.3 kg (170 lb 6.7 oz) 77.4 kg (170 lb 10.2 oz)    Exam:     Afebrile, vital signs are stable. Gen. Appears calm, comfortable.  Psych. Alert. Speech fluent and clear.  Eyes. Pupils, lids appear unremarkable  ENT. Grossly unremarkable  Cardiovascular. Irregular, heart rate 90-100s on telemetry. Atrial fibrillation on telemetry. No murmur, rub or gallop. 1+ bilateral lower extremity edema.  Respiratory. Clear to auscultation bilaterally. No wheezes, rales or rhonchi. Normal respiratory effort.  Skin. Dry skin noted  Musculoskeletal. Moves all extremities to command  Data Reviewed: I/O: Weight without significant change. Excellent urine output,  2750.  Chemistry: Basic metabolic panel unremarkable. Tone is negative.  Other: EKG on admission atrial fibrillation, incomplete right upper lobe branch block  Imaging: Chest x-ray pulmonary edema, moderate left effusion on admission  Scheduled Meds: . antiseptic oral rinse  7 mL Mouth Rinse BID  .  apixaban  2.5 mg Oral BID  . aspirin EC  81 mg Oral Daily  .  Chlorhexidine Gluconate Cloth  6 each Topical Q0600  . diltiazem  30 mg Oral 4 times per day  . furosemide  40 mg Intravenous BID  . levothyroxine  125 mcg Oral QAC breakfast  . lisinopril  2.5 mg Oral Daily  . Living Better with Heart Failure Book   Does not apply Once  . mupirocin ointment  1 application Nasal BID  . nystatin cream   Topical BID  . PARoxetine  20 mg Oral Daily  . phenytoin  100 mg Oral QHS  . potassium chloride  20 mEq Oral BID  . sodium chloride  3 mL Intravenous Q12H   Continuous Infusions: . diltiazem (CARDIZEM) infusion 5 mg/hr (05/28/14 1723)    Principal Problem:   Atrial fibrillation with RVR Active Problems:   Hypertension   Hypothyroidism   Seizure disorder   Acute pulmonary edema   Acute systolic and diastolic CHF   Acute respiratory failure with hypoxia   Moderate tricuspid regurgitation   Moderate to severe pulmonary hypertension   Time spent 25 minutes

## 2014-05-29 NOTE — Care Management Utilization Note (Signed)
UR completed 

## 2014-05-29 NOTE — Progress Notes (Signed)
Report called to Jyl Heinz on 300. Patient ready for transfer to floor.

## 2014-05-29 NOTE — Care Management Note (Addendum)
    Page 1 of 2   05/31/2014     2:13:45 PM CARE MANAGEMENT NOTE 05/31/2014  Patient:  Erica [name], Erica [name]   Account Number:  [id number]  Date Initiated:  05/29/2014  Documentation initiated by:  Theophilus Kinds  Subjective/Objective Assessment:   Pt admitted from home with a fib. Pt lives alone and has a son in law that lives next door and is very active in the care of the pt. Pt has a life alert button and has a walker, shower chair for home use. Pt stated that she is fairly indepe     Action/Plan:   Will continue to follow for discharge planning needs. Pt may need home O2 at discharge.   Anticipated DC Date:  05/31/2014   Anticipated DC Plan:  HOME/SELF CARE  In-house referral  Clinical Social Worker      DC Forensic scientist  CM consult      PAC Choice  [place]   Choice offered to / List presented to:  C-1 Patient   DME arranged  [facility]      DME agency  [place] arranged  [place].   Status of service:  Completed, signed off Medicare Important Message given?  YES (If response is "NO", the following Medicare IM given date fields will be blank) Date Medicare IM given:  05/31/2014 Medicare IM given by:  Jolene Provost Date Additional Medicare IM given:   Additional Medicare IM given by:    Discharge Disposition:  [place]  Per UR Regulation:    If discussed at Long Length of Stay Meetings, dates discussed:    Comments:  05/31/2014 [place], RN, MSN, PCCN pt reassed by PT and found to be appropriate for SNF if 24-hr supervision not available. Pt has chosen to go to SNF temporarily for PT services. SCW to arrange for [facility] services, pt to discharge to [facility] today, pt and pt's family agreeable with SNF disposition. [facility] notified that home O2 not no longer necessary and they will come and pick up port tank. No further CM needs at this  time.  05/30/2014 [place], RN, MSN, PCCN Pt will be discharged with [facility] PT and oxygen. Pt has requested to have [facility] for both services. Emma of [facility] has been notified of oxygen needs and will deliver oxgyen to pt at the hospital today. Zada Finders of [facility] has been notified of [facility] PT needs and will obtain information from pt's chart and contact pt prior to discharge. Pt has no further CM needs at this time.  05/29/14 [place], RN BSN CM

## 2014-05-29 NOTE — Progress Notes (Signed)
  Echocardiogram 2D Echocardiogram has been performed.  [place], [place] 05/29/2014, 9:37 AM

## 2014-05-29 NOTE — Progress Notes (Signed)
Patient transported to 300 via bed. Telemetry monitor applied. Patient 97 on monitor and o2 sat 94% on room air.

## 2014-05-29 NOTE — Progress Notes (Signed)
Weaning patients oxygen. 94% on 2 liters by [place] at this time. Will continue to monitor.

## 2014-05-29 NOTE — Evaluation (Signed)
Physical Therapy Evaluation Patient Details Name: Erica [name] MRN: [id number] DOB: 12/03/1916 Today's Date: 05/29/2014   History of Present Illness  Erica [name] is a 80 y.o. female who is relatively independent, ambulates with the assistance of a walker. Patient reports that she had progressive shortness of breath which began yesterday. Her symptoms progressively got worse to the point that she got up this morning and was unable to breathe. She describes paroxysmal nocturnal dyspnea as well as orthopnea. She denies any chest pain. No palpitations. She does endorse lower extremity edema. She's not had any fever, she has an occasional cough. She's noticed increased urination. She's not had any vomiting or diarrhea. She was brought to the emergency room where she was noted to be in rapid atrial fibrillation. She was also noted to be hypoxic in the 80s on room air and chest x-ray showed pulmonary edema.   Clinical Impression  Pt is a 80 year old female who presents to physical therapy with dx of a-fib with RVR.  During evaluation, pt required min/mod assist for bed mobility skills, min/mod assist for transfers, and min guard and use of RW for gait of 15 feet.  Noted decreased O2 sats after gait without O2 to 79%, O2 reapplied at 1L in sitting.  After 3 minutes with increase to only 88% RN called and RN increased O2 to 2L.  Pt required 6 minutes of additional supine rest break and 2L of O2 to increase O2 sats to 95%.  Recommend continued PT to address strengthening, activity tolerance, and balance for improved functional mobility skills with transition to HHPT services.  Son in Sports coach and wife present during evaluation and educated family/pt on need for 24/7 supervision for some time after return home due to generalized weakness and increased assistance required with functional mobility skills; family agrees to assist as needed.  No DME recommendations at this time.      Follow Up  Recommendations Home health PT;Supervision/Assistance - 24 hour    Equipment Recommendations  None recommended by PT       Precautions / Restrictions Precautions Precautions: Fall Restrictions Weight Bearing Restrictions: No      Mobility  Bed Mobility Overal bed mobility: Needs Assistance Bed Mobility: Supine to Sit;Sit to Supine     Supine to sit: Min assist;Mod assist (Mod assist for trunk, min assist for LE to scoot to EOB) Sit to supine: Mod assist (Mod assist for LE)      Transfers Overall transfer level: Needs assistance Equipment used: Rolling walker (2 wheeled) Transfers: Sit to/from Stand Sit to Stand: Min assist;Mod assist         General transfer comment: Multiple attempts to transfer to standing  Ambulation/Gait Ambulation/Gait assistance: Min guard Ambulation Distance (Feet): 15 Feet Assistive device: Rolling walker (2 wheeled) Gait Pattern/deviations: Antalgic;Step-to pattern   Gait velocity interpretation: Below normal speed for age/gender General Gait Details: Antalgic gait secondary to pain in Lt LE     Balance Overall balance assessment: History of Falls;Needs assistance (2 falls in past year) Sitting-balance support: Feet unsupported;Bilateral upper extremity supported Sitting balance-Leahy Scale: Good     Standing balance support: Bilateral upper extremity supported;During functional activity Standing balance-Leahy Scale: Fair                               Pertinent Vitals/Pain Pain Assessment: No/denies pain    Home Living Family/patient expects to be discharged to:: Private residence  Available Help at Discharge: Family;Available 24 hours/day (Son in Sports coach and his wife live next door) Type of Home: Mobile home Home Access: Ramped entrance (Handrail on Lt)     Home Layout: One level Home Equipment: Tub bench;Bedside commode;Walker - 2 wheels;Cane - single point;Wheelchair - manual (handicap height  toilet) Additional Comments: Tub shower    Prior Function Level of Independence: Needs assistance   Gait / Transfers Assistance Needed: Pt reports mod (I) with bed mobility skills, transfers, and household amb with use of RW  ADL's / Homemaking Assistance Needed: Pt reports her son in law does her grocery shopping, cooking        Hand Dominance   Dominant Hand: Right    Extremity/Trunk Assessment               Lower Extremity Assessment: Generalized weakness         Communication   Communication: No difficulties  Cognition Arousal/Alertness: Awake/alert Behavior During Therapy: WFL for tasks assessed/performed Overall Cognitive Status: Within Functional Limits for tasks assessed                               Assessment/Plan    PT Assessment Patient needs continued PT services  PT Diagnosis Difficulty walking;Generalized weakness   PT Problem List Decreased strength;Decreased activity tolerance;Decreased mobility;Cardiopulmonary status limiting activity  PT Treatment Interventions Balance training;Gait training;Neuromuscular re-education;Functional mobility training;Therapeutic activities;Therapeutic exercise;Patient/family education   PT Goals (Current goals can be found in the Care Plan section) Acute Rehab PT Goals Patient Stated Goal: walk PT Goal Formulation: With patient Time For Goal Achievement: 06/12/14 Potential to Achieve Goals: Good    Frequency Min 3X/week    End of Session Equipment Utilized During Treatment: Gait belt;Oxygen Activity Tolerance: Other (comment) (Limited by drop in O2 sats) Patient left: in bed;with call bell/phone within reach;with bed alarm set;with family/visitor present Nurse Communication: Other (comment) (O2 sats)         Time: 2703-5009 PT Time Calculation (min): 31 min   Charges:   PT Evaluation $Initial PT Evaluation Tier I: 1 Procedure      Woodward Klem 05/29/2014, 4:41 PM

## 2014-05-30 ENCOUNTER — Inpatient Hospital Stay (HOSPITAL_COMMUNITY): Payer: Medicare Other

## 2014-05-30 ENCOUNTER — Encounter (HOSPITAL_COMMUNITY): Payer: Self-pay | Admitting: Cardiology

## 2014-05-30 DIAGNOSIS — I2789 Other specified pulmonary heart diseases: Secondary | ICD-10-CM

## 2014-05-30 DIAGNOSIS — I5041 Acute combined systolic (congestive) and diastolic (congestive) heart failure: Principal | ICD-10-CM

## 2014-05-30 DIAGNOSIS — I079 Rheumatic tricuspid valve disease, unspecified: Secondary | ICD-10-CM

## 2014-05-30 LAB — BASIC METABOLIC PANEL
ANION GAP: 8 (ref 5–15)
BUN: 16 mg/dL (ref 6–23)
CHLORIDE: 101 meq/L (ref 96–112)
CO2: 35 meq/L — AB (ref 19–32)
CREATININE: 0.71 mg/dL (ref 0.50–1.10)
Calcium: 8.6 mg/dL (ref 8.4–10.5)
GFR calc Af Amer: 83 mL/min — ABNORMAL LOW (ref >90)
GFR calc non Af Amer: 72 mL/min — ABNORMAL LOW (ref >90)
Glucose, Bld: 115 mg/dL — ABNORMAL HIGH (ref 70–99)
Potassium: 4.6 mEq/L (ref 3.7–5.3)
SODIUM: 144 meq/L (ref 137–147)

## 2014-05-30 MED ORDER — METOPROLOL SUCCINATE ER 25 MG PO TB24
25.0000 mg | ORAL_TABLET | Freq: Every day | ORAL | Status: AC
  Administered 2014-05-30: 25 mg via ORAL
  Filled 2014-05-30: qty 1

## 2014-05-30 MED ORDER — METOPROLOL SUCCINATE ER 50 MG PO TB24
50.0000 mg | ORAL_TABLET | Freq: Every day | ORAL | Status: DC
  Administered 2014-05-31: 50 mg via ORAL
  Filled 2014-05-30: qty 1

## 2014-05-30 NOTE — Progress Notes (Signed)
Patient was given Living with Heart Failure book. RN went through it with patient and emphasized importance of daily weights and low-sodium diet. Explained the three zones to patient, and used teach back method to evaluate learning. Patient is eager to learn. Reinforcement on key topics will be helpful to her.

## 2014-05-30 NOTE — Discharge Instructions (Signed)
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
## 2014-05-30 NOTE — Progress Notes (Signed)
O2 saturation evaluation:  Patient's O2 saturation was 90% at rest on room air.  Patient's O2 saturation was 88% with activity on room air. Patient stated feeling "dizzy".  Patient's O2 saturation was 97% with activity on 2L nasal cannula.

## 2014-05-30 NOTE — Plan of Care (Signed)
Problem: Food- and Nutrition-Related Knowledge Deficit (NB-1.1) Goal: Nutrition education Formal process to instruct or train a patient/client in a skill or to impart knowledge to help patients/clients voluntarily manage or modify food choices and eating behavior to maintain or improve health. Outcome: Progressing Nutrition Education Note  RD consulted for nutrition education regarding new onset CHF.  RD provided "Low Sodium Nutrition Therapy" handout from the Academy of [employer]. Reviewed patient's dietary recall. Provided examples on ways to decrease sodium intake in diet. Discouraged intake of processed foods and use of salt shaker. Encouraged fresh fruits and vegetables as well as whole grain sources of carbohydrates to maximize fiber intake.   RD discussed why it is important for patient to adhere to diet recommendations, and emphasized the role of fluids, foods to avoid, and importance of weighing self daily. Teach back method used.  Expect good compliance.  Body mass index is 26.85 kg/(m^2). Pt meets criteria for overweight based on current BMI.  Current diet order is Heart Healthy, patient is consuming approximately 100% of meals at this time. Labs and medications reviewed. No further nutrition interventions warranted at this time. RD contact information provided. If additional nutrition issues arise, please re-consult RD.   Erica [name] A. Jimmye Norman, RD, LDN Pager: [phone]

## 2014-05-30 NOTE — Progress Notes (Signed)
Per report to first shift, patient was able to get up to [facility] with assist last night for BM. However, O2 saturations dropped into 80s on 1.5L, and needed to be increased to 3L to obtain saturations of 93-97%. Will monitor O2 sats today, and continue trying to wean down oxygen.

## 2014-05-30 NOTE — Progress Notes (Signed)
PROGRESS NOTE  Erica [name] LFY:[id number] DOB: 04/01/1917 DOA: 05/28/2014 PCP: Woody Seller, MD  Summary: 80 year old woman with no report history of cardiac disease who presented with progressive shortness of breath, dyspnea on exertion, paroxysmal nocturnal dyspnea and orthopnea. She was admitted for atrial fibrillation with rapid ventricular response, acute hypoxic respiratory failure and pulmonary edema.  Assessment/Plan: 1. Atrial fibrillation with rapid ventricular response, suspect secondary to valvular disease. CHADS-Vasc 5. Overall somewhat improved with borderline control on current dosing of Toprol-XL. Continue Eliquis. 2. Acute hypoxic respiratory failure, secondary to atrial fibrillation and acute diastolic heart failure. 3. Acute systolic/diastolic congestive heart failure, slowly improving with diuresis. Continue beta blocker, ACE inhibitor. 4. Left-sided pleural effusion. Appears improved today with diuresis. 5. Moderate tricuspid regurgitation, moderate to severe pulmonary hypertension. 6. History of seizures. 7. Hypothyroidism. TSH elevated. Synthroid increased. Followup in 4-6 weeks with repeat TSH.   Appears stable although still has borderline hypoxia and some lower extremity edema. Rate control also borderline.  Plan to continue IV diuresis today, followup basic metabolic panel in the morning. Wean oxygen as tolerated.  Increase Toprol. Follow heart rate.  Code Status: DNR DVT prophylaxis: Eliquis Family Communication: none prseent Disposition Plan: home with home health, likely next 86 hours  Murray Hodgkins, MD  [facility]  Pager [phone] If 7PM-7AM, please contact night-coverage at [email or web address], password [facility] 05/30/2014, 1:03 PM  LOS: 2 days   Consultants:  Physical therapy: Home health  Procedures:  2-D Echocardiogram: Moderate LVH with LVEF 40-45%, diffuse hypokinesis, possible grade 2 diastolic dysfunction, severe left  atrial enlargement, moderate tricuspid regurgitation with moderate to severe pulmonary hypertension.  Antibiotics:    HPI/Subjective: Overall feeling tired, "drunk". Breathing seems to be doing okay. Somewhat less lower extremity edema.  Objective: Filed Vitals:   05/29/14 2102 05/30/14 0500 05/30/14 0512 05/30/14 1015  BP: 124/76  126/70   Pulse: 103  91 90  Temp: 98 F (36.7 C)  97.5 F (36.4 C)   TempSrc: Oral  Oral   Resp:   20   Height:      Weight:  75.433 kg (166 lb 4.8 oz)    SpO2: 97%  93%     Intake/Output Summary (Last 24 hours) at 05/30/14 1303 Last data filed at 05/30/14 1100  Gross per 24 hour  Intake    480 ml  Output   1251 ml  Net   -771 ml     Filed Weights   05/28/14 1510 05/29/14 0500 05/30/14 0500  Weight: 77.3 kg (170 lb 6.7 oz) 77.4 kg (170 lb 10.2 oz) 75.433 kg (166 lb 4.8 oz)    Exam:     Afebrile. Vitals are stable. No hypoxia, stable.  Gen. Appears calm, comfortable sitting in chair eating lunch.  Cardiovascular irregular. Heart rate 90-100s. Telemetry atrial fibrillation. No murmur, rub, gallop. Somewhat decreased right lower extremity edema compared to the left leg. 1-2+.  Data Reviewed: I/O: Weight down 2 kg. Urine output 1826. -4.6 L since admission.  Chemistry: Basic metabolic panel stable.  Imaging: Repeat chest x-ray independently reviewed, there does appear to be improvement in interstitial edema and decrease in left pleural effusion.  Scheduled Meds: . antiseptic oral rinse  7 mL Mouth Rinse BID  . apixaban  2.5 mg Oral BID  . aspirin EC  81 mg Oral Daily  . Chlorhexidine Gluconate Cloth  6 each Topical Q0600  . furosemide  20 mg Intravenous BID  . levothyroxine  150 mcg Oral QAC breakfast  .  lisinopril  2.5 mg Oral Daily  . Living Better with Heart Failure Book   Does not apply Once  . metoprolol succinate  25 mg Oral Daily  . mupirocin ointment  1 application Nasal BID  . nystatin cream   Topical BID  . PARoxetine   20 mg Oral Daily  . phenytoin  100 mg Oral QHS  . potassium chloride  20 mEq Oral BID  . sodium chloride  3 mL Intravenous Q12H   Continuous Infusions:    Principal Problem:   Atrial fibrillation with RVR Active Problems:   Hypertension   Hypothyroidism   Seizure disorder   Acute pulmonary edema   Acute systolic and diastolic CHF   Acute respiratory failure with hypoxia   Moderate tricuspid regurgitation   Moderate to severe pulmonary hypertension   Time spent 20 minutes

## 2014-05-31 ENCOUNTER — Inpatient Hospital Stay
Admission: RE | Admit: 2014-05-31 | Discharge: 2014-08-06 | Disposition: A | Discharge status: Other Acute Inpatient Hospital | Payer: Medicare Other | Source: Ambulatory Visit | Attending: Internal Medicine | Admitting: Internal Medicine

## 2014-05-31 DIAGNOSIS — J90 Pleural effusion, not elsewhere classified: Principal | ICD-10-CM

## 2014-05-31 DIAGNOSIS — R059 Cough, unspecified: Secondary | ICD-10-CM

## 2014-05-31 DIAGNOSIS — R05 Cough: Secondary | ICD-10-CM

## 2014-05-31 LAB — BASIC METABOLIC PANEL
ANION GAP: 11 (ref 5–15)
BUN: 17 mg/dL (ref 6–23)
CO2: 32 mEq/L (ref 19–32)
Calcium: 8.9 mg/dL (ref 8.4–10.5)
Chloride: 99 mEq/L (ref 96–112)
Creatinine, Ser: 0.6 mg/dL (ref 0.50–1.10)
GFR, EST AFRICAN AMERICAN: 88 mL/min — AB (ref >90)
GFR, EST NON AFRICAN AMERICAN: 76 mL/min — AB (ref >90)
Glucose, Bld: 119 mg/dL — ABNORMAL HIGH (ref 70–99)
POTASSIUM: 4.3 meq/L (ref 3.7–5.3)
SODIUM: 142 meq/L (ref 137–147)

## 2014-05-31 LAB — HEMOGLOBIN A1C
HEMOGLOBIN A1C: 5.8 % — AB (ref <5.7)
MEAN PLASMA GLUCOSE: 120 mg/dL — AB (ref <117)

## 2014-05-31 MED ORDER — POTASSIUM CHLORIDE CRYS ER 20 MEQ PO TBCR
20.0000 meq | EXTENDED_RELEASE_TABLET | Freq: Every day | ORAL | Status: DC
  Administered 2014-05-31: 20 meq via ORAL
  Filled 2014-05-31: qty 1

## 2014-05-31 MED ORDER — LEVOTHYROXINE SODIUM 150 MCG PO TABS: 150.0000 ug | ORAL_TABLET | Freq: Every day | ORAL | Status: DC

## 2014-05-31 MED ORDER — LISINOPRIL 2.5 MG PO TABS: 2.5000 mg | ORAL_TABLET | Freq: Every day | ORAL | Status: DC

## 2014-05-31 MED ORDER — FUROSEMIDE 20 MG PO TABS
20.0000 mg | ORAL_TABLET | Freq: Every day | ORAL | Status: DC
  Administered 2014-05-31: 20 mg via ORAL
  Filled 2014-05-31: qty 1

## 2014-05-31 MED ORDER — APIXABAN 2.5 MG PO TABS: 2.5000 mg | ORAL_TABLET | Freq: Two times a day (BID) | ORAL | Status: DC

## 2014-05-31 MED ORDER — METOPROLOL SUCCINATE ER 50 MG PO TB24: 50.0000 mg | ORAL_TABLET | Freq: Every day | ORAL | Status: DC

## 2014-05-31 NOTE — Clinical Social Work Placement (Signed)
Clinical Social Work Department CLINICAL SOCIAL WORK PLACEMENT NOTE 05/31/2014  Patient:  ALYSSANDRA, HULSEBUS  Account Number:  [id number] Admit date:  05/28/2014  Clinical Social Worker:  Benay Pike, LCSW  Date/time:  05/31/2014 11:17 AM  Clinical Social Work is seeking post-discharge placement for this patient at the following level of care:   SKILLED NURSING   (*CSW will update this form in Epic as items are completed)   05/31/2014  Patient/family provided with [facility] Department of Clinical Social Work's list of facilities offering this level of care within the geographic area requested by the patient (or if unable, by the patient's family).  05/31/2014  Patient/family informed of their freedom to choose among providers that offer the needed level of care, that participate in Medicare, Medicaid or managed care program needed by the patient, have an available bed and are willing to accept the patient.  05/31/2014  Patient/family informed of [facility]' ownership interest in [facility], as well as of the fact that they are under no obligation to receive care at this facility.  PASARR submitted to EDS on  PASARR number received on   FL2 transmitted to all facilities in geographic area requested by pt/family on  05/31/2014 FL2 transmitted to all facilities within larger geographic area on   Patient informed that his/her managed care company has contracts with or will negotiate with  certain facilities, including the following:     Patient/family informed of bed offers received:  05/31/2014 Patient chooses bed at [facility] Physician recommends and patient chooses bed at  [facility]  Patient to be transferred to [facility] on  05/31/2014 Patient to be transferred to facility by RN Patient and family notified of transfer on 05/31/2014 Name of family member notified:  Mikki Santee- son-in-law  The following physician request were entered in  Epic:   Additional Comments:  Benay Pike, [place] Pt has existing pasarr.

## 2014-05-31 NOTE — Plan of Care (Signed)
Problem: Discharge Progression Outcomes Goal: Barriers To Progression Addressed/Resolved Outcome: Completed/Met Date Met:  05/31/14 Pt sent to [facility] center skilled nursing facility Report called to tabetha Goal: Able to perform self care activities Outcome: Progressing Feeds self is alert oriented Goal: Discharge plan in place and appropriate Outcome: Completed/Met Date Met:  05/31/14 [facility] facility Goal: If EF < 40% ACEI/ARB addressed at discharge Outcome: Completed/Met Date Met:  05/31/14 EF 40 45 % Goal: Pain controlled with appropriate interventions Outcome: Completed/Met Date Met:  05/31/14 denies Goal: Hemodynamically stable Outcome: Completed/Met Date Met:  05/31/14 O2 at 2 liters  Goal: Complications resolved/controlled Outcome: Progressing [facility] nursing Goal: Activity appropriate for discharge plan Outcome: Completed/Met Date Met:  05/31/14 Up to chair with assistance

## 2014-05-31 NOTE — Clinical Social Work Note (Signed)
Pt accepts bed offer at [facility] on CHF unit. CSW called pt's son-in-law, Mikki Santee to discuss d/c plan. He appeared relieved pt would go to SNF. Pt d/c today and will transfer with RN.   Benay Pike, [place]

## 2014-05-31 NOTE — Plan of Care (Signed)
Problem: Discharge Progression Outcomes Goal: Other Discharge Outcomes/Goals Outcome: Completed/Met Date Met:  05/31/14 To [place] [facility] center for skilled care

## 2014-05-31 NOTE — Progress Notes (Signed)
PROGRESS NOTE  Erica [name] NAT:[id number] DOB: Nov 23, 1916 DOA: 05/28/2014 PCP: Woody Seller, MD  Summary: 80 year old woman with no report history of cardiac disease who presented with progressive shortness of breath, dyspnea on exertion, paroxysmal nocturnal dyspnea and orthopnea. She was admitted for atrial fibrillation with rapid ventricular response, acute hypoxic respiratory failure and pulmonary edema.  Assessment/Plan: 1. Atrial fibrillation with rapid ventricular response. CHADS-Vasc 5. Continues to improve. Continue Toprol-XL. Continue Eliquis. 2. Acute hypoxic respiratory failure, secondary to atrial fibrillation and acute diastolic heart failure. Gradually improving. 3. Acute systolic/diastolic congestive heart failure. Acute component appears resolved. Euvolemic. Continue beta blocker, ACE inhibitor. 4. Left-sided pleural effusion. Clinically and radiographically improved, follow-up as an outpatient. 5. Moderate tricuspid regurgitation, moderate to severe pulmonary hypertension. 6. History of seizures. Quiescent.  7. Hypothyroidism. TSH elevated. Synthroid was increased. Followup in 4-6 weeks with repeat TSH.   Overall much improved. Minimal hypoxia expect will resolve over time. She appears to be euvolemic. Transition to oral Lasix. Continue Eliquis 2.5 mg BID per cardiology, no ASA.  Plan transfer to skilled nursing facility today based on PT recommendations.  Followup with cardiology as an outpatient has been arranged.  Discussed with son in law by telephone [name].   Murray Hodgkins, MD  [facility]  Pager [phone] If 7PM-7AM, please contact night-coverage at [email or web address], password [facility] 05/31/2014, 1:26 PM  LOS: 3 days   Consultants:  Physical therapy: Home health  Procedures:  2-D Echocardiogram: Moderate LVH with LVEF 40-45%, diffuse hypokinesis, possible grade 2 diastolic dysfunction, severe left atrial enlargement, moderate  tricuspid regurgitation with moderate to severe pulmonary hypertension.  Antibiotics:    HPI/Subjective: Overall doing better. Less lower extremity edema. Breathing fine, little short-winded.  Objective: Filed Vitals:   05/31/14 0617 05/31/14 0736 05/31/14 1001 05/31/14 1100  BP: 130/70  130/73   Pulse: 89  75   Temp: 97.8 F (36.6 C)     TempSrc: Oral     Resp: 20     Height:      Weight:      SpO2: 98% 98%  93%    Intake/Output Summary (Last 24 hours) at 05/31/14 1326 Last data filed at 05/31/14 0930  Gross per 24 hour  Intake    240 ml  Output   1075 ml  Net   -835 ml     Filed Weights   05/29/14 0500 05/30/14 0500 05/31/14 0500  Weight: 77.4 kg (170 lb 10.2 oz) 75.433 kg (166 lb 4.8 oz) 72.394 kg (159 lb 9.6 oz)    Exam:     Afebrile, vitals are stable. Mild hypoxia 2 L stable.  Gen. Appears calm, comfortable. Sitting in chair.  Alert. Speech fluent and clear.  Cardiovascular irregular. Normal rate. No murmur, rub or gallop. Trace bilateral lower extremity edema.  Respiratory clear to auscultation bilaterally. No wheezes, rales or rhonchi. Normal respiratory effort.  Data Reviewed: I/O: Weight down 5 kg since admission. Urine output 1925. -5.2 L since admission.  Chemistry: Basic metabolic panel remains stable.  Scheduled Meds: . antiseptic oral rinse  7 mL Mouth Rinse BID  . apixaban  2.5 mg Oral BID  . Chlorhexidine Gluconate Cloth  6 each Topical Q0600  . furosemide  20 mg Oral Daily  . levothyroxine  150 mcg Oral QAC breakfast  . lisinopril  2.5 mg Oral Daily  . Living Better with Heart Failure Book   Does not apply Once  . metoprolol succinate  50 mg Oral Daily  . mupirocin  ointment  1 application Nasal BID  . nystatin cream   Topical BID  . PARoxetine  20 mg Oral Daily  . phenytoin  100 mg Oral QHS  . potassium chloride  20 mEq Oral Daily  . sodium chloride  3 mL Intravenous Q12H   Continuous Infusions:    Principal Problem:    Atrial fibrillation with RVR Active Problems:   Hypertension   Hypothyroidism   Seizure disorder   Acute pulmonary edema   Acute systolic and diastolic CHF   Acute respiratory failure with hypoxia   Moderate tricuspid regurgitation   Moderate to severe pulmonary hypertension

## 2014-05-31 NOTE — Clinical Social Work Psychosocial (Signed)
Clinical Social Work Department BRIEF PSYCHOSOCIAL ASSESSMENT 05/31/2014  Patient:  Erica [name], Erica [name]     Account Number:  [id number]     Admit date:  05/28/2014  Clinical Social Worker:  Wy[place] Haste  Date/Time:  05/31/2014 11:18 AM  Referred by:  Physician  Date Referred:  05/31/2014 Referred for  SNF Placement   Other Referral:   Interview type:  Patient Other interview type:    PSYCHOSOCIAL DATA Living Status:  ALONE Admitted from facility:   Level of care:   Primary support name:  Mikki Santee Primary support relationship to patient:  FAMILY Degree of support available:   supportive per pt    CURRENT CONCERNS Current Concerns  Post-Acute Placement   Other Concerns:    SOCIAL WORK ASSESSMENT / PLAN CSW met with pt at bedside following referral for possible SNF. Pt alert and oriented and reports she lives at home alone. Her son-in-law lives next door and is best support. Pt had 4 children, but only 2 are living. Her sons both live out of state. Pt relies on her son-in-law, Mikki Santee for assistance with paying bills, getting groceries, and going to the doctor. Pt came to ED due to shortness of breath and was admitted with atrial fibrillation. At baseline, she is fairly independent and ambulates with a walker. Pt states she still drives. She has more difficulty preparing food and family generally brings her meals. Pt said she has a one story home and does not have to ambulate far. However, she is definitely feeling weaker than normal and is requiring oxygen. PT worked with pt today and recommendation is for home health vs SNF. CSW discussed this with pt and she states SNF "wouldn't be a bad idea." She definitely does not feel she could manage at home in current condition. Pt went to [facility] in the past and is willing to go again for a short period of time prior to return home. Medicare coverage/criteria discussed with pt. CSW will initiate bed search in [place] at her request and follow up  with offers when available. Possible d/c today per MD.   Assessment/plan status:  Psychosocial Support/Ongoing Assessment of Needs Other assessment/ plan:   Information/referral to community resources:   SNF list    PATIENT'S/FAMILY'S RESPONSE TO PLAN OF CARE: Pt reports positive feelings regarding short term SNF for rehab.       Benay Pike, [place]

## 2014-05-31 NOTE — Progress Notes (Addendum)
Patient: Erica [name] / Admit Date: 05/28/2014 / Date of Encounter: 05/31/2014, 8:14 AM  Consulting cardiologist: Dr. Satira Sark  Subjective: Feeling better. No CP, no dyspnea. Was able to sleep flat on her back.  Objective: Telemetry: atrial fib, rates 70s-80s right now Physical Exam: Blood pressure 130/70, pulse 89, temperature 97.8 F (36.6 C), temperature source Oral, resp. rate 20, height 5\' 6"  (1.676 m), weight 159 lb 9.6 oz (72.394 kg), SpO2 98.00%. General: Well developed, well nourished elderly WF in no acute distress. She looks slightly younger than stated age. Laying flat in bed without difficulty. Head: Normocephalic, atraumatic, sclera non-icteric, no xanthomas, nares are without discharge.  Neck: JVD not elevated.  Lungs: Distant breath sounds near bases but improved air movement. Breathing is unlabored.  Heart: Irreg irreg with S1 S2. No murmurs, rubs, or gallops appreciated.  Abdomen: Soft but somewhat rounded with normoactive bowel sounds. No hepatomegaly. No rebound/guarding. No obvious abdominal masses.  Msk: Strength and tone appear normal for age.  Extremities: No clubbing or cyanosis. Tr LE edema. Distal pedal pulses in tact.  Neuro: Alert and oriented X 3. No facial asymmetry. No focal deficit. Moves all extremities spontaneously. Watching TV and is interested in the story on President Eulas Post being diagnosed with melanoma.  Psych: Responds to questions appropriately with a normal affect.    Intake/Output Summary (Last 24 hours) at 05/31/14 0814 Last data filed at 05/30/14 2052  Gross per 24 hour  Intake    240 ml  Output   1925 ml  Net  -1685 ml    Inpatient Medications:  . antiseptic oral rinse  7 mL Mouth Rinse BID  . apixaban  2.5 mg Oral BID  . Chlorhexidine Gluconate Cloth  6 each Topical Q0600  . furosemide  20 mg Intravenous BID  . levothyroxine  150 mcg Oral QAC breakfast  . lisinopril  2.5 mg Oral Daily  . Living Better with  Heart Failure Book   Does not apply Once  . metoprolol succinate  50 mg Oral Daily  . mupirocin ointment  1 application Nasal BID  . nystatin cream   Topical BID  . PARoxetine  20 mg Oral Daily  . phenytoin  100 mg Oral QHS  . potassium chloride  20 mEq Oral BID  . sodium chloride  3 mL Intravenous Q12H    Labs:  Recent Labs  05/30/14 0537 05/31/14 0620  NA 144 142  K 4.6 4.3  CL 101 99  CO2 35* 32  GLUCOSE 115* 119*  BUN 16 17  CREATININE 0.71 0.60  CALCIUM 8.6 8.9     Recent Labs  05/28/14 1000  WBC 5.6  NEUTROABS 4.1  HGB 14.5  HCT 43.5  MCV 95.0  PLT 170    Recent Labs  05/28/14 1000 05/28/14 1521 05/28/14 2129 05/29/14 0329  TROPONINI <0.30 <0.30 <0.30 <0.30    Recent Labs  05/30/14 0535  HGBA1C 5.8*     Radiology/Studies:  Dg Chest Port 1 View 05/30/2014   CLINICAL DATA:  Pulmonary effusion/edema  EXAM: PORTABLE CHEST - 1 VIEW  COMPARISON:  05/28/2014  FINDINGS: Pleural effusions left greater than right. Consolidation/ atelectasis in the lung bases. Moderate diffuse interstitial edema slightly improved. Central pulmonary vascular congestion. Stable cardiomegaly. Atheromatous aorta. Degenerative changes in bilateral shoulders.  IMPRESSION: 1. Partial improvement in interstitial edema with persistent pleural effusions and cardiomegaly.   Electronically Signed   By: Arne Cleveland M.D.   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   1. Newly recognized rapid atrial fibrillation, for rate control 2. Acute combined systolic/diastolic CHF - EF 09-47% - with left pleural effusion  3. Acute hypoxic respiratory failure due to the  above  4. Hypothyroidism with elevated TSH, levothyroxine adjusted  5. Mod TR, mild-mod MR by echo 05/29/14  6. Mod-severe pulmonary HTN by echo 05/29/14  7. H/o falls, including C2 fracture in 2014, none for the last 6 months  HR and CHF improved on increased Toprol dose. CHADSVASC = 5 indicating high risk of embolic stroke. Despite risk of stroke, we have also considered her fall risk. She now uses a walker for ambulation and has not fallen in the last 6 months. She technically qualifies for 5mg  BID of apixaban (weight >60kg, Cr <1.5) but we think the lower dose of 2.5mg  BID is a good compromise in the setting of her 80. We have d/c'd aspirin to minimize bleeding risk. We favor conservative approach with rate control strategy and outpatient f/u to give thyroid function a chance to normalize. With low dose Lasix and rate control, she has diuresed 11lbs and - 5.4L. She appears euvolemic. I will d/c IV Lasix (confirmed with nursing that she did not get dose this AM) and start her back on home dose of oral Lasix and decrease KCl supp. I suspect she ran into trouble with CHF due to rate control, not necessarily intrinsic to her LV function itself, which is why I have chosen to resume home dose for now. Will discuss plan with MD but suspect she is nearing the point where she can f/u closely in the office.  Signed, Melina Copa PA-C   Attending note:  Patient seen and examined. Agree with above assessment by Ms. Dunn PA-C. She is doing better clinically with improved breathing status, no palpitations or chest pain. Heart rate control is better following increase in beta blocker dosing. She has diuresed nearly 5 and half liters showing improvement in volume status. Agree with transition back to oral Lasix. Likely nearing discharge. We can certainly see her back in the office along with her primary care provider for ongoing management.  Satira Sark, M.D., F.A.C.C.

## 2014-05-31 NOTE — Discharge Summary (Signed)
Physician Discharge Summary  Erica [name] URK:[id number] DOB: 1917/06/03 DOA: 05/28/2014  PCP: Woody Seller, MD  Admit date: 05/28/2014 Discharge date: 05/31/2014  Recommendations for Outpatient Follow-up:  1. Atrial fibrillation, new diagnosis, started on Eliquis. Monitor rate control. 2. Acute hypoxic respiratory failure secondary atrial fibrillation diastolic heart failure. Wean oxygen as tolerated. 3. Systolic/diastolic heart failure. Suggest daily weights. 4. Left-sided pleural effusion, asymptomatic. Consider radiographic followup in the next one to 2 weeks if clinically indicated. 5. Hypothyroidism. Synthroid increased based on elevated TSH. Consider repeat TSH 4-6 weeks.  6. Physical therapy at skilled nursing facility   Follow-up Information   Follow up with Jory Sims, NP On 06/07/2014. (2:30 PM)    Specialty:  Nurse Practitioner   Contact information:   [place] [address] [phone]       Follow up with Woody Seller, MD. Schedule an appointment as soon as possible for a visit in 2 weeks.   Specialty:  Family Medicine   Contact information:   [address] [place] [address] [phone]      Discharge Diagnoses:  1. Atrial fibrillation with rapid ventricular response 2. Acute hypoxic respiratory failure secondary to atrial fibrillation and acute systolic/diastolic heart failure 3. Acute systolic/diastolic congestive heart failure 4. Left-sided pleural effusion 5. Moderate tricuspid regurgitation 6. Moderate to severe pulmonary hypertension next history of seizure disorder 7. Hypothyroidism  Discharge Condition: Improved Disposition: Transfer to skilled nursing facility for short-term rehabilitation  Diet recommendation: Heart healthy diet  Filed Weights   05/29/14 0500 05/30/14 0500 05/31/14 0500  Weight: 77.4 kg (170 lb 10.2 oz) 75.433 kg (166 lb 4.8 oz) 72.394 kg (159 lb 9.6 oz)    History of present  illness:  80 year old woman with no report history of cardiac disease who presented with progressive shortness of breath, dyspnea on exertion, paroxysmal nocturnal dyspnea and orthopnea. She was admitted for atrial fibrillation with rapid ventricular response, acute hypoxic respiratory failure and pulmonary edema.  Hospital Course:  Erica [name] was placed on IV diltiazem, rate was controlled and she was switched to oral Toprol-XL because of systolic dysfunction. With adjustment of dosing her rate has remained controlled. She was seen in consultation with cardiology and will continue on low-dose Eliquis for atrial fibrillation. She also had hypoxic respiratory failure which has improved but not resolved and is multifactorial as outlined below. She was treated for acute systolic and diastolic heart failure with significant improvement. She has a moderate pleural effusion which has improved by repeat imaging and appears to be asymptomatic at this point. At this point she is stable for transfer to skilled nursing facility for short-term rehabilitation. Individual issues as below.  1. Atrial fibrillation with rapid ventricular response. CHADS-Vasc 5. Continues to improve. Continue Toprol-XL. Continue Eliquis. 2. Acute hypoxic respiratory failure, secondary to atrial fibrillation and acute diastolic heart failure. Gradually improving. 3. Acute systolic/diastolic congestive heart failure. Acute component appears resolved. Euvolemic. Continue beta blocker, ACE inhibitor. 4. Left-sided pleural effusion. Clinically and radiographically improved, follow-up as an outpatient. 5. Moderate tricuspid regurgitation, moderate to severe pulmonary hypertension. 6. History of seizures. Quiescent.  7. Hypothyroidism. TSH elevated. Synthroid was increased. Followup in 4-6 weeks with repeat TSH. Overall much improved. Minimal hypoxia expect will resolve over time. She appears to be euvolemic. Transition to oral Lasix.  Continue Eliquis 2.5 mg BID per cardiology, no ASA.  Plan transfer to skilled nursing facility today based on PT recommendations.  Followup with cardiology as an outpatient has  been arranged.  Discussed with son in law by telephone [name].   Consultants:  Physical therapy: Home health Procedures:  2-D Echocardiogram: Moderate LVH with LVEF 40-45%, diffuse hypokinesis, possible grade 2 diastolic dysfunction, severe left atrial enlargement, moderate tricuspid regurgitation with moderate to severe pulmonary hypertension.  Discharge Instructions  Discharge Instructions   (HEART FAILURE PATIENTS) Call MD:  Anytime you have any of the following symptoms: 1) 3 pound weight gain in 24 hours or 5 pounds in 1 week 2) shortness of breath, with or without a dry hacking cough 3) swelling in the hands, feet or stomach 4) if you have to sleep on extra pillows at night in order to breathe.    Complete by:  As directed             Medication List    STOP taking these medications       amLODipine 2.5 MG tablet  Commonly known as:  NORVASC      TAKE these medications       acetaminophen 325 MG tablet  Commonly known as:  TYLENOL  Take 2 tablets (650 mg total) by mouth every 6 (six) hours as needed.     apixaban 2.5 MG Tabs tablet  Commonly known as:  ELIQUIS  Take 1 tablet (2.5 mg total) by mouth 2 (two) times daily.     B-complex with vitamin C tablet  Take 1 tablet by mouth daily. OTC  [facility] brand     fexofenadine 180 MG tablet  Commonly known as:  ALLEGRA  Take 180 mg by mouth daily.     furosemide 20 MG tablet  Commonly known as:  LASIX  Take 20 mg by mouth daily.     levothyroxine 150 MCG tablet  Commonly known as:  SYNTHROID, LEVOTHROID  Take 1 tablet (150 mcg total) by mouth daily before breakfast.     lisinopril 2.5 MG tablet  Commonly known as:  PRINIVIL,ZESTRIL  Take 1 tablet (2.5 mg total) by mouth daily.     metoprolol succinate 50 MG 24 hr tablet  Commonly  known as:  TOPROL-XL  Take 1 tablet (50 mg total) by mouth daily. Take with or immediately following a meal.     multivitamin with minerals Tabs tablet  Take 1 tablet by mouth daily.     PARoxetine 20 MG tablet  Commonly known as:  PAXIL  Take 20 mg by mouth daily.     phenytoin 100 MG ER capsule  Commonly known as:  DILANTIN  Take 100 mg by mouth at bedtime.     potassium chloride 10 MEQ tablet  Commonly known as:  K-DUR,KLOR-CON  Take 10 mEq by mouth daily.     Vitamin D3 2000 UNITS Tabs  Take 1 tablet by mouth daily.       Allergies  Allergen Reactions  . Clarithromycin Other (See Comments)    "made me ill"-reaction years ago  . Ditropan [Oxybutynin Chloride] Itching and Other (See Comments)    Constipation  . Prednisone Other (See Comments)    Stomach pain & dizziness  . Vioxx [Rofecoxib] Other (See Comments)    dizziness    The results of significant diagnostics from this hospitalization (including imaging, microbiology, ancillary and laboratory) are listed below for reference.    Significant Diagnostic Studies: Dg Chest Port 1 View  05/30/2014   CLINICAL DATA:  Pulmonary effusion/edema  EXAM: PORTABLE CHEST - 1 VIEW  COMPARISON:  05/28/2014  FINDINGS: Pleural effusions left greater than right.  Consolidation/ atelectasis in the lung bases. Moderate diffuse interstitial edema slightly improved. Central pulmonary vascular congestion. Stable cardiomegaly. Atheromatous aorta. Degenerative changes in bilateral shoulders.  IMPRESSION: 1. Partial improvement in interstitial edema with persistent pleural effusions and cardiomegaly.   Electronically Signed   By: Arne Cleveland M.D.   On: 05/30/2014 10:19   Dg Chest Port 1 View  05/28/2014   CLINICAL DATA:  Shortness of breath.  EXAM: PORTABLE CHEST - 1 VIEW  COMPARISON:  02/04/2013  FINDINGS: Single view of the chest demonstrates diffuse parenchymal lung densities with peribronchial thickening. These findings are compatible  with pulmonary edema. Densities in the left lower chest suggest a moderate-sized left pleural effusion. The heart is obscured by the pleural fluid but it appears to be upper limits of normal. Atherosclerotic calcifications at the aortic arch.  IMPRESSION: Diffuse pulmonary edema with a moderate-sized left pleural effusion.   Electronically Signed   By: Markus Daft M.D.   On: 05/28/2014 11:09    Microbiology: Recent Results (from the past 240 hour(s))  MRSA PCR SCREENING     Status: Abnormal   Collection Time    05/28/14  1:17 PM      Result Value Ref Range Status   MRSA by PCR POSITIVE (*) NEGATIVE Final   Comment:            The GeneXpert MRSA Assay (FDA     approved for NASAL specimens     only), is one component of a     comprehensive MRSA colonization     surveillance program. It is not     intended to diagnose MRSA     infection nor to guide or     monitor treatment for     MRSA infections.     RESULT CALLED TO, READ BACK BY AND VERIFIED WITH:     ROWE,K. AT 1830 ON 05/28/2014 BY BAUGHAM,M.     Labs: Basic Metabolic Panel:  Recent Labs Lab 05/28/14 1000 05/29/14 0329 05/30/14 0537 05/31/14 0620  NA 143 140 144 142  K 4.1 4.0 4.6 4.3  CL 104 99 101 99  CO2 27 31 35* 32  GLUCOSE 105* 135* 115* 119*  BUN 14 10 16 17   CREATININE 0.56 0.63 0.71 0.60  CALCIUM 8.8 8.4 8.6 8.9   CBC:  Recent Labs Lab 05/28/14 1000  WBC 5.6  NEUTROABS 4.1  HGB 14.5  HCT 43.5  MCV 95.0  PLT 170   Cardiac Enzymes:  Recent Labs Lab 05/28/14 1000 05/28/14 1521 05/28/14 2129 05/29/14 0329  TROPONINI <0.30 <0.30 <0.30 <0.30     Recent Labs  05/28/14 1000  PROBNP 1935.0*   CBG:  Recent Labs Lab 05/29/14 1138  GLUCAP 113*    Principal Problem:   Atrial fibrillation with RVR Active Problems:   Hypertension   Hypothyroidism   Seizure disorder   Acute pulmonary edema   Acute systolic and diastolic CHF   Acute respiratory failure with hypoxia   Moderate tricuspid  regurgitation   Moderate to severe pulmonary hypertension   Time coordinating discharge: 35 minutes  Signed:  Murray Hodgkins, MD [facility] 05/31/2014, 1:38 PM

## 2014-05-31 NOTE — Clinical Social Work Placement (Signed)
Clinical Social Work Department CLINICAL SOCIAL WORK PLACEMENT NOTE 05/31/2014  Patient:  Erica [name], Erica [name]  Account Number:  [id number] Admit date:  05/28/2014  Clinical Social Worker:  Benay Pike, LCSW  Date/time:  05/31/2014 11:17 AM  Clinical Social Work is seeking post-discharge placement for this patient at the following level of care:   SKILLED NURSING   (*CSW will update this form in Epic as items are completed)   05/31/2014  Patient/family provided with [place] Department of Clinical Social Work's list of facilities offering this level of care within the geographic area requested by the patient (or if unable, by the patient's family).  05/31/2014  Patient/family informed of their freedom to choose among providers that offer the needed level of care, that participate in Medicare, Medicaid or managed care program needed by the patient, have an available bed and are willing to accept the patient.  05/31/2014  Patient/family informed of [facility]' ownership interest in [facility], as well as of the fact that they are under no obligation to receive care at this facility.  PASARR submitted to EDS on  PASARR number received on   FL2 transmitted to all facilities in geographic area requested by pt/family on  05/31/2014 FL2 transmitted to all facilities within larger geographic area on   Patient informed that his/her managed care company has contracts with or will negotiate with  certain facilities, including the following:     Patient/family informed of bed offers received:   Patient chooses bed at  Physician recommends and patient chooses bed at    Patient to be transferred to  on   Patient to be transferred to facility by  Patient and family notified of transfer on  Name of family member notified:    The following physician request were entered in Epic:   Additional Comments: Pt has existing pasarr.  Benay Pike, [place]

## 2014-05-31 NOTE — Progress Notes (Signed)
Physical Therapy Treatment Patient Details Name: Erica [name] MRN: [id number] DOB: 09-05-1917 Today's Date: 05/31/2014    History of Present Illness Erica [name] is a 80 y.o. female who is relatively independent, ambulates with the assistance of a walker. Patient reports that she had progressive shortness of breath which began yesterday. Her symptoms progressively got worse to the point that she got up this morning and was unable to breathe. She describes paroxysmal nocturnal dyspnea as well as orthopnea. She denies any chest pain. No palpitations. She does endorse lower extremity edema. She's not had any fever, she has an occasional cough. She's noticed increased urination. She's not had any vomiting or diarrhea. She was brought to the emergency room where she was noted to be in rapid atrial fibrillation. She was also noted to be hypoxic in the 80s on room air and chest x-ray showed pulmonary edema.     PT Comments    Noted continued O2 support via [place] at 2L today prior to treatment sessions.  O2 left on pt throughout treatment session, with a decrease in sats to 94 after transfer to EOB, 94 after standing, and 93 after amb of 10 feet.  VC throughout treatment for deep breathing, breathing in through nose and out through mouth.  Pt able to complete supine and seated exercises without assistance or pain, though did require rest break between exercises due to decreased activity tolerance.  Noted mild improvement in bed mobility skills and transfers, though physical assist still required to complete activities due to decreased activity tolerance/generalized weakness.  Rest break required after transferring to standing secondary to fatigue and decreased O2 sats.  Per pt, she feels generally weak and has very low activity tolerance during functional activities.  Pt reports she will be unable to care for herself, and her family will be unable to physically assist her (as her son in laws new  wife has a back injury which prevents her from lifting), though she would be able to provide supervision or ADLs assist as needed.  Pt may require short term SNF placement to address activity tolerance, strengthening for improved functional mobility skills as pt lives alone and is currently unable to care for self without physical assistance.    Follow Up Recommendations  SNF;Home health PT;Supervision/Assistance - 24 hour     Equipment Recommendations  None recommended by PT       Precautions / Restrictions Precautions Precautions: Fall Precaution Comments: Watch O2 levels with functional activities Restrictions Weight Bearing Restrictions: No    Mobility  Bed Mobility Overal bed mobility: Needs Assistance Bed Mobility: Supine to Sit     Supine to sit: Min assist (for trunk)        Transfers Overall transfer level: Needs assistance Equipment used: Rolling walker (2 wheeled) Transfers: Sit to/from [facility] Sit to Stand: Min assist;Min guard Stand pivot transfers: Min guard          Ambulation/Gait Ambulation/Gait assistance: Min guard Ambulation Distance (Feet): 10 Feet Assistive device: Rolling walker (2 wheeled) Gait Pattern/deviations: Step-to pattern;Antalgic   Gait velocity interpretation: Below normal speed for age/gender General Gait Details: 10' x2, with seated rest break between trials.               Exercises General Exercises - Lower Extremity Ankle Circles/Pumps: 10 reps;AROM;Both;Supine Long Arc Quad: 10 reps;AROM;Both;Seated Heel Slides: 10 reps;AROM;Both;Supine Hip ABduction/ADduction: 10 reps;AROM;Both;Supine Toe Raises: 10 reps;AROM;Both;Seated Heel Raises: 10 reps;AROM;Both;Seated        Pertinent Vitals/Pain Pain  Assessment: No/denies pain           PT Goals (current goals can now be found in the care plan section) Progress towards PT goals: Progressing toward goals    Frequency  Min 3X/week    PT Plan  Current plan remains appropriate;Discharge plan needs to be updated       End of Session Equipment Utilized During Treatment: Gait belt;Oxygen (O2 2L throughout treatment) Activity Tolerance: Patient limited by fatigue Patient left: in chair;with call bell/phone within reach;with chair alarm set     Time: 2725-3664 PT Time Calculation (min): 35 min  Charges:  $Gait Training: 8-22 mins $Therapeutic Exercise: 8-22 mins                     Ashar Lewinski 05/31/2014, 11:24 AM

## 2014-06-03 ENCOUNTER — Non-Acute Institutional Stay (SKILLED_NURSING_FACILITY): Payer: Medicare Other | Admitting: Internal Medicine

## 2014-06-03 DIAGNOSIS — J9601 Acute respiratory failure with hypoxia: Secondary | ICD-10-CM

## 2014-06-03 DIAGNOSIS — J9600 Acute respiratory failure, unspecified whether with hypoxia or hypercapnia: Secondary | ICD-10-CM

## 2014-06-03 DIAGNOSIS — I480 Paroxysmal atrial fibrillation: Secondary | ICD-10-CM

## 2014-06-03 DIAGNOSIS — I509 Heart failure, unspecified: Secondary | ICD-10-CM

## 2014-06-03 DIAGNOSIS — I5042 Chronic combined systolic (congestive) and diastolic (congestive) heart failure: Secondary | ICD-10-CM

## 2014-06-03 DIAGNOSIS — I4891 Unspecified atrial fibrillation: Secondary | ICD-10-CM

## 2014-06-05 NOTE — Progress Notes (Signed)
Patient ID: Erica [name], female   DOB: Jan 19, 1917, 80 y.o.   MRN: [id number]               HISTORY & PHYSICAL  DATE:  06/03/2014     FACILITY: [facility]    LEVEL OF CARE:   SNF   CHIEF COMPLAINT:  Review, status post stay at [facility], 05/28/2014 through 05/31/2014.    HISTORY OF PRESENT ILLNESS:  This is a 80 year-old woman who lives in her own home.  She presented with progressive shortness of breath, dyspnea, and orthopnea.  She was admitted when she was found to have atrial fibrillation with rapid ventricular response.  She was put on IV diltiazem, then switched to Toprol XL.   She had systolic dysfunction on her echocardiogram with an EF of 40-45%, diffuse hypokinesis, grade 2 diastolic dysfunction, severe left atrial enlargement, moderate TR with moderate to severe pulmonary hypertension.  Her CHADS vascular score was 5.  She was put on Toprol XL and Eliquis.  On presentation, she had acute hypoxic  respiratory failure.  Her atrial fib was felt to be possibly paroxysmal.    Her TSH level was elevated.  Synthroid was increased.    PAST MEDICAL HISTORY/PROBLEM LIST:  Includes:    Hypertension.    Hemorrhoids.    Thyroid disease.    Osteoarthritis.    Seizures.    Depression.    Hyperlipidemia.    Skin cancer.    PAST SURGICAL HISTORY:  Includes:    Cataract extraction bilaterally.    Carpal tunnel release in the left hand.    Shoulder arthroscopy on the right.    Total hip arthroplasty on the right.    Total knee arthroplasty on the right.    Total knee arthroplasty on the left.    Cholecystectomy.    Breast cyst excision.     Abdominal hysterectomy.    Tonsillectomy.    Skin lesion excision.    Open reduction and internal fixation of the distal radius in 2014.    CURRENT MEDICATIONS:  Discharge medications include:       Tylenol p.r.n.    Eliquis 2.5 b.i.d.    Allegra 180 q.d.    Lasix 20 q.d.    Synthroid 150 q.d.     Lisinopril 2.5 q.d.     Toprol XL 50 q.d.    Paxil 20 q.d.    Dilantin 100 at bedtime.    K-dur 10 mEq daily.    Vitamin D3, 2000 U daily.    SOCIAL HISTORY:    HOUSING:  The patient tells me she lives in [place].   FUNCTIONAL STATUS:  She did have a fall history, but not since she used a walker at home.  It sounds as though she has extensive in-home family support.    REVIEW OF SYSTEMS:   GENERAL:  The patient states she does not feel well, but is not very specific.   CHEST/RESPIRATORY:  On oxygen.  She was not on oxygen at home.  She does not really describe shortness of breath.   CARDIAC:   No clear chest pain or palpitations.    PHYSICAL EXAMINATION:   VITAL SIGNS:   PULSE:  94 and fairly regular.   RESPIRATIONS:  18.   O2 SATURATIONS:  95% on 2 L.   GENERAL APPEARANCE:  The patient is alert, conversational.   CHEST/RESPIRATORY:  Shallow air entry bilaterally.  There are no crackles or wheezes.  Her work  of breathing is normal.   CARDIOVASCULAR:  CARDIAC:  Heart sounds are regular.  JVP is elevated at 45.  There is a 2/6 pansystolic murmur at the lower left sternal border.   GASTROINTESTINAL:  ABDOMEN:   Soft.   LIVER/SPLEEN/KIDNEYS:  No liver, no spleen.  No tenderness.   CIRCULATION:   EDEMA/VARICOSITIES:  Extremities:  Probably some degree of venous stasis.  I suspect widespread solar-related skin damage in her arms and legs.   ARTERIAL:  Peripheral pulses are intact.    NEUROLOGICAL:    BALANCE/GAIT:  I did not see her on her feet.    ASSESSMENT/PLAN:  Atrial fibrillation, paroxysmal.  Now on Eliquis.  Her heart rate is in the 90s.  She may need more beta blocker.  Her weight today is 166 pounds.  She was 164 when she came in.    Hypoxic  respiratory failure.  Secondary to diastolic heart failure and atrial fibrillation.  We will need to monitor this.  Whether she requires oxygen at this point, I am uncertain.  Noted that she has severe pulmonary  hypertension, probably secondary to longstanding left ventricular heart failure.    Left-sided pleural effusion, with consideration of radiographic follow-up in one week to two weeks.    Hypothyroidism.  Synthroid was increased.  Follow-up TSH in 4-6 weeks.    The patient's cardiac status will need to be carefully followed.  At this point, I am not going to increase her Lasix but I am going to monitor her weights.  She is listed as having a left-sided pleural effusion.  Her exam suggests possibly a bilateral effusion.  This seems verified on her last chest x-ray on 05/30/2014 when she had pleural effusions, left greater than right, and continued interstitial edema.    CPT CODE: 61443        ADDENDUM:  After reviewing her chart and looking at her last chest x-ray, I am going to increase her Lasix to 40 mg.
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
## 2014-06-07 ENCOUNTER — Ambulatory Visit (INDEPENDENT_AMBULATORY_CARE_PROVIDER_SITE_OTHER): Payer: Medicare Other | Admitting: Adult Health

## 2014-06-07 ENCOUNTER — Encounter: Payer: Self-pay | Admitting: Adult Health

## 2014-06-07 VITALS — BP 120/64 | HR 90 | Ht 66.0 in | Wt 160.0 lb

## 2014-06-07 DIAGNOSIS — I509 Heart failure, unspecified: Secondary | ICD-10-CM

## 2014-06-07 DIAGNOSIS — I5041 Acute combined systolic (congestive) and diastolic (congestive) heart failure: Secondary | ICD-10-CM

## 2014-06-07 DIAGNOSIS — I4891 Unspecified atrial fibrillation: Secondary | ICD-10-CM

## 2014-06-07 NOTE — Assessment & Plan Note (Signed)
Good control of BP. NO changes in medical regimen.

## 2014-06-07 NOTE — Assessment & Plan Note (Signed)
No evidence of decompensation. Weight is stable. She is adhering to low sodium diet at [facility]. She is actually enjoying her stay there. She will continue current medical regimen without changes. No labs will be drawn today as labs were drawn one week ago and were WNL.  Will see her in 3 months.

## 2014-06-07 NOTE — Progress Notes (Deleted)
Name: [name]    DOB: 08-25-1917  Age: 80 y.o.  MR#: [id number]       PCP:  Woody Seller, MD      Insurance: Payor: MEDICARE / Plan: MEDICARE PART A AND B / Product Type: *No Product type* /   CC:    Chief Complaint  Patient presents with  . Atrial Fibrillation  . Congestive Heart Failure    VS There were no vitals filed for this visit.  Weights Current Weight  05/31/14 159 lb 9.6 oz (72.394 kg)  04/07/13 143 lb (64.864 kg)  04/07/13 143 lb (64.864 kg)    Blood Pressure  BP Readings from Last 3 Encounters:  05/31/14 130/73  04/10/13 126/76  04/10/13 126/76     Admit date:  (Not on file) Last encounter with RMR:  Visit date not found   Allergy Clarithromycin; Ditropan; Prednisone; and Vioxx  No current outpatient prescriptions on file.   No current facility-administered medications for this visit.    Discontinued Meds:   There are no discontinued medications.  Patient Active Problem List   Diagnosis Date Noted  . Moderate tricuspid regurgitation 05/29/2014  . Moderate to severe pulmonary hypertension 05/29/2014  . Atrial fibrillation with RVR 05/28/2014  . Acute pulmonary edema 05/28/2014  . Acute systolic and diastolic CHF 84/69/6295  . Acute respiratory failure with hypoxia 05/28/2014  . Vertigo 02/05/2013  . C2 cervical fracture 02/05/2013  . UTI (urinary tract infection) 03/30/2012  . Generalized weakness 03/30/2012  . Hypokalemia 03/30/2012  . Dehydration 03/30/2012  . Hypertension 03/30/2012  . Hypothyroidism 03/30/2012  . Rhabdomyolysis 03/30/2012  . Seizure disorder 03/30/2012  . Hyperlipidemia 03/30/2012  . Fall 03/30/2012  . Skin lesion of left leg 10/27/2011  . History of squamous cell carcinoma, right leg, left leg excised 12/21/2011 06/25/2011    LABS    Component Value Date/Time   NA 142 05/31/2014 0620   NA 144 05/30/2014 0537   NA 140 05/29/2014 0329   K 4.3 05/31/2014 0620   K 4.6 05/30/2014 0537   K 4.0 05/29/2014  0329   CL 99 05/31/2014 0620   CL 101 05/30/2014 0537   CL 99 05/29/2014 0329   CO2 32 05/31/2014 0620   CO2 35* 05/30/2014 0537   CO2 31 05/29/2014 0329   GLUCOSE 119* 05/31/2014 0620   GLUCOSE 115* 05/30/2014 0537   GLUCOSE 135* 05/29/2014 0329   BUN 17 05/31/2014 0620   BUN 16 05/30/2014 0537   BUN 10 05/29/2014 0329   CREATININE 0.60 05/31/2014 0620   CREATININE 0.71 05/30/2014 0537   CREATININE 0.63 05/29/2014 0329   CALCIUM 8.9 05/31/2014 0620   CALCIUM 8.6 05/30/2014 0537   CALCIUM 8.4 05/29/2014 0329   GFRNONAA 76* 05/31/2014 0620   GFRNONAA 72* 05/30/2014 0537   GFRNONAA 74* 05/29/2014 0329   GFRAA 88* 05/31/2014 0620   GFRAA 83* 05/30/2014 0537   GFRAA 86* 05/29/2014 0329   CMP     Component Value Date/Time   NA 142 05/31/2014 0620   K 4.3 05/31/2014 0620   CL 99 05/31/2014 0620   CO2 32 05/31/2014 0620   GLUCOSE 119* 05/31/2014 0620   BUN 17 05/31/2014 0620   CREATININE 0.60 05/31/2014 0620   CALCIUM 8.9 05/31/2014 0620   PROT 5.6* 04/02/2012 0645   ALBUMIN 2.5* 04/02/2012 0645   AST 40* 04/02/2012 0645   ALT 33 04/02/2012 0645   ALKPHOS 71 04/02/2012 0645   BILITOT 0.5 04/02/2012 0645   GFRNONAA  76* 05/31/2014 0620   GFRAA 88* 05/31/2014 0620       Component Value Date/Time   WBC 5.6 05/28/2014 1000   WBC 9.2 04/07/2013 1838   WBC 6.0 02/06/2013 0700   HGB 14.5 05/28/2014 1000   HGB 14.3 04/07/2013 1956   HGB 13.2 04/07/2013 1838   HCT 43.5 05/28/2014 1000   HCT 42.0 04/07/2013 1956   HCT 39.3 04/07/2013 1838   MCV 95.0 05/28/2014 1000   MCV 94.9 04/07/2013 1838   MCV 91.5 02/06/2013 0700    Lipid Panel  No results found for this basename: chol, trig, hdl, cholhdl, vldl, ldlcalc    ABG    Component Value Date/Time   TCO2 35 04/07/2013 1956     Lab Results  Component Value Date   TSH 12.620* 05/28/2014   BNP (last 3 results)  Recent Labs  05/28/14 1000  PROBNP 1935.0*   Cardiac Panel (last 3 results) No results found for this basename: CKTOTAL, CKMB, TROPONINI, RELINDX,  in  the last 72 hours  Iron/TIBC/Ferritin/ %Sat No results found for this basename: iron, tibc, ferritin, ironpctsat     EKG Orders placed during the hospital encounter of 05/28/14  . EKG 12-LEAD  . EKG 12-LEAD  . EKG     Prior Assessment and Plan Problem List as of 06/07/2014     Cardiovascular and Mediastinum   Hypertension   Atrial fibrillation with RVR   Acute systolic and diastolic CHF   Moderate tricuspid regurgitation   Moderate to severe pulmonary hypertension     Respiratory   Acute pulmonary edema   Acute respiratory failure with hypoxia     Endocrine   Hypothyroidism     Nervous and Auditory   Seizure disorder     Musculoskeletal and Integument   Skin lesion of left leg   Rhabdomyolysis   C2 cervical fracture     Genitourinary   UTI (urinary tract infection)     Other   History of squamous cell carcinoma, right leg, left leg excised 12/21/2011   Generalized weakness   Hypokalemia   Dehydration   Hyperlipidemia   Fall   Vertigo       Imaging: Dg Chest Port 1 View  05/30/2014   CLINICAL DATA:  Pulmonary effusion/edema  EXAM: PORTABLE CHEST - 1 VIEW  COMPARISON:  05/28/2014  FINDINGS: Pleural effusions left greater than right. Consolidation/ atelectasis in the lung bases. Moderate diffuse interstitial edema slightly improved. Central pulmonary vascular congestion. Stable cardiomegaly. Atheromatous aorta. Degenerative changes in bilateral shoulders.  IMPRESSION: 1. Partial improvement in interstitial edema with persistent pleural effusions and cardiomegaly.   Electronically Signed   By: Arne Cleveland M.D.   On: 05/30/2014 10:19   Dg Chest Port 1 View  05/28/2014   CLINICAL DATA:  Shortness of breath.  EXAM: PORTABLE CHEST - 1 VIEW  COMPARISON:  02/04/2013  FINDINGS: Single view of the chest demonstrates diffuse parenchymal lung densities with peribronchial thickening. These findings are compatible with pulmonary edema. Densities in the left lower chest  suggest a moderate-sized left pleural effusion. The heart is obscured by the pleural fluid but it appears to be upper limits of normal. Atherosclerotic calcifications at the aortic arch.  IMPRESSION: Diffuse pulmonary edema with a moderate-sized left pleural effusion.   Electronically Signed   By: Markus Daft M.D.   On: 05/28/2014 11:09

## 2014-06-07 NOTE — Progress Notes (Deleted)
Name: [name]    DOB: Dec 31, 1916  Age: 80 y.o.  MR#: [id number]       PCP:  Woody Seller, MD      Insurance: Payor: MEDICARE / Plan: MEDICARE PART A AND B / Product Type: *No Product type* /   CC:    Chief Complaint  Patient presents with  . Atrial Fibrillation  . Congestive Heart Failure    VS Filed Vitals:   06/07/14 1455  BP: 120/64  Pulse: 90  Height: 5\' 6"  (1.676 m)  Weight: 160 lb (72.576 kg)  SpO2: 95%    Weights Current Weight  06/07/14 160 lb (72.576 kg)  05/31/14 159 lb 9.6 oz (72.394 kg)  04/07/13 143 lb (64.864 kg)    Blood Pressure  BP Readings from Last 3 Encounters:  06/07/14 120/64  05/31/14 130/73  04/10/13 126/76     Admit date:  (Not on file) Last encounter with RMR:  Visit date not found   Allergy Clarithromycin; Ditropan; Prednisone; and Vioxx  No current outpatient prescriptions on file.   No current facility-administered medications for this visit.    Discontinued Meds:    Medications Discontinued During This Encounter  Medication Reason  . apixaban (ELIQUIS) 2.5 MG TABS tablet Discontinued by provider  . lisinopril (PRINIVIL,ZESTRIL) 2.5 MG tablet Discontinued by provider  . metoprolol succinate (TOPROL-XL) 50 MG 24 hr tablet Discontinued by provider    Patient Active Problem List   Diagnosis Date Noted  . Moderate tricuspid regurgitation 05/29/2014  . Moderate to severe pulmonary hypertension 05/29/2014  . Atrial fibrillation with RVR 05/28/2014  . Acute pulmonary edema 05/28/2014  . Acute systolic and diastolic CHF 40/98/1191  . Acute respiratory failure with hypoxia 05/28/2014  . Vertigo 02/05/2013  . C2 cervical fracture 02/05/2013  . UTI (urinary tract infection) 03/30/2012  . Generalized weakness 03/30/2012  . Hypokalemia 03/30/2012  . Dehydration 03/30/2012  . Hypertension 03/30/2012  . Hypothyroidism 03/30/2012  . Rhabdomyolysis 03/30/2012  . Seizure disorder 03/30/2012  . Hyperlipidemia  03/30/2012  . Fall 03/30/2012  . Skin lesion of left leg 10/27/2011  . History of squamous cell carcinoma, right leg, left leg excised 12/21/2011 06/25/2011    LABS    Component Value Date/Time   NA 142 05/31/2014 0620   NA 144 05/30/2014 0537   NA 140 05/29/2014 0329   K 4.3 05/31/2014 0620   K 4.6 05/30/2014 0537   K 4.0 05/29/2014 0329   CL 99 05/31/2014 0620   CL 101 05/30/2014 0537   CL 99 05/29/2014 0329   CO2 32 05/31/2014 0620   CO2 35* 05/30/2014 0537   CO2 31 05/29/2014 0329   GLUCOSE 119* 05/31/2014 0620   GLUCOSE 115* 05/30/2014 0537   GLUCOSE 135* 05/29/2014 0329   BUN 17 05/31/2014 0620   BUN 16 05/30/2014 0537   BUN 10 05/29/2014 0329   CREATININE 0.60 05/31/2014 0620   CREATININE 0.71 05/30/2014 0537   CREATININE 0.63 05/29/2014 0329   CALCIUM 8.9 05/31/2014 0620   CALCIUM 8.6 05/30/2014 0537   CALCIUM 8.4 05/29/2014 0329   GFRNONAA 76* 05/31/2014 0620   GFRNONAA 72* 05/30/2014 0537   GFRNONAA 74* 05/29/2014 0329   GFRAA 88* 05/31/2014 0620   GFRAA 83* 05/30/2014 0537   GFRAA 86* 05/29/2014 0329   CMP     Component Value Date/Time   NA 142 05/31/2014 0620   K 4.3 05/31/2014 0620   CL 99 05/31/2014 0620   CO2 32 05/31/2014 4782  GLUCOSE 119* 05/31/2014 0620   BUN 17 05/31/2014 0620   CREATININE 0.60 05/31/2014 0620   CALCIUM 8.9 05/31/2014 0620   PROT 5.6* 04/02/2012 0645   ALBUMIN 2.5* 04/02/2012 0645   AST 40* 04/02/2012 0645   ALT 33 04/02/2012 0645   ALKPHOS 71 04/02/2012 0645   BILITOT 0.5 04/02/2012 0645   GFRNONAA 76* 05/31/2014 0620   GFRAA 88* 05/31/2014 0620       Component Value Date/Time   WBC 5.6 05/28/2014 1000   WBC 9.2 04/07/2013 1838   WBC 6.0 02/06/2013 0700   HGB 14.5 05/28/2014 1000   HGB 14.3 04/07/2013 1956   HGB 13.2 04/07/2013 1838   HCT 43.5 05/28/2014 1000   HCT 42.0 04/07/2013 1956   HCT 39.3 04/07/2013 1838   MCV 95.0 05/28/2014 1000   MCV 94.9 04/07/2013 1838   MCV 91.5 02/06/2013 0700    Lipid Panel  No results found for this basename: chol, trig, hdl,  cholhdl, vldl, ldlcalc    ABG    Component Value Date/Time   TCO2 35 04/07/2013 1956     Lab Results  Component Value Date   TSH 12.620* 05/28/2014   BNP (last 3 results)  Recent Labs  05/28/14 1000  PROBNP 1935.0*   Cardiac Panel (last 3 results) No results found for this basename: CKTOTAL, CKMB, TROPONINI, RELINDX,  in the last 72 hours  Iron/TIBC/Ferritin/ %Sat No results found for this basename: iron, tibc, ferritin, ironpctsat     EKG Orders placed during the hospital encounter of 05/28/14  . EKG 12-LEAD  . EKG 12-LEAD  . EKG     Prior Assessment and Plan Problem List as of 06/07/2014     Cardiovascular and Mediastinum   Hypertension   Atrial fibrillation with RVR   Acute systolic and diastolic CHF   Moderate tricuspid regurgitation   Moderate to severe pulmonary hypertension     Respiratory   Acute pulmonary edema   Acute respiratory failure with hypoxia     Endocrine   Hypothyroidism     Nervous and Auditory   Seizure disorder     Musculoskeletal and Integument   Skin lesion of left leg   Rhabdomyolysis   C2 cervical fracture     Genitourinary   UTI (urinary tract infection)     Other   History of squamous cell carcinoma, right leg, left leg excised 12/21/2011   Generalized weakness   Hypokalemia   Dehydration   Hyperlipidemia   Fall   Vertigo       Imaging: Dg Chest Port 1 View  05/30/2014   CLINICAL DATA:  Pulmonary effusion/edema  EXAM: PORTABLE CHEST - 1 VIEW  COMPARISON:  05/28/2014  FINDINGS: Pleural effusions left greater than right. Consolidation/ atelectasis in the lung bases. Moderate diffuse interstitial edema slightly improved. Central pulmonary vascular congestion. Stable cardiomegaly. Atheromatous aorta. Degenerative changes in bilateral shoulders.  IMPRESSION: 1. Partial improvement in interstitial edema with persistent pleural effusions and cardiomegaly.   Electronically Signed   By: Arne Cleveland M.D.   On: 05/30/2014  10:19   Dg Chest Port 1 View  05/28/2014   CLINICAL DATA:  Shortness of breath.  EXAM: PORTABLE CHEST - 1 VIEW  COMPARISON:  02/04/2013  FINDINGS: Single view of the chest demonstrates diffuse parenchymal lung densities with peribronchial thickening. These findings are compatible with pulmonary edema. Densities in the left lower chest suggest a moderate-sized left pleural effusion. The heart is obscured by the pleural fluid but it appears to  be upper limits of normal. Atherosclerotic calcifications at the aortic arch.  IMPRESSION: Diffuse pulmonary edema with a moderate-sized left pleural effusion.   Electronically Signed   By: Markus Daft M.D.   On: 05/28/2014 11:09

## 2014-06-07 NOTE — Patient Instructions (Signed)
Your physician recommends that you schedule a follow-up appointment in:  3 months    Your physician recommends that you continue on your current medications as directed. Please refer to the Current Medication list given to you today.     Thank you for choosing [facility] !   

## 2014-06-07 NOTE — Assessment & Plan Note (Signed)
HR is well controlled.She is tolerating Eliquis without melena or overt bleeding. She has some bruising which I have explained to her is normal. Will see her again in 3 months.

## 2014-06-14 ENCOUNTER — Non-Acute Institutional Stay (SKILLED_NURSING_FACILITY): Payer: Medicare Other | Admitting: Internal Medicine

## 2014-06-14 DIAGNOSIS — I5041 Acute combined systolic (congestive) and diastolic (congestive) heart failure: Secondary | ICD-10-CM

## 2014-06-14 DIAGNOSIS — R609 Edema, unspecified: Secondary | ICD-10-CM | POA: Insufficient documentation

## 2014-06-14 DIAGNOSIS — I4891 Unspecified atrial fibrillation: Secondary | ICD-10-CM

## 2014-06-14 DIAGNOSIS — I509 Heart failure, unspecified: Secondary | ICD-10-CM

## 2014-06-14 NOTE — Progress Notes (Signed)
Patient ID: [name], female   DOB: 1917-04-05, 80 y.o.   MRN: [id number]   This is an acute visit.  Level of care skilled.  Facility [facility].  Chief complaint-acute visit secondary to possibly increase left lower extremity edema.  History of present illness.  Patient is a pleasant 80 year old female with a history of CHF as well as atrial fibrillation.  She did have a cardiac echo on 05/29/2014 showed left ventricular ejection fraction 4045% and grade 2 diastolic dysfunction.  She required diuresis in the hospital and apparently lost 21 pounds with a discharge weight of 159 pounds.  She was discharged from the hospital on 20 mg of Lasix approximately 10 days ago Dr. Dellia Nims did increase this to 40 mg secondary to some weight gain her weight at that time apparently was 166 pounds.  Nursing staff today apparently noticed what they thought was some increased edema of her lower extremities and I'm following up on this.  Patient does not complaining of any increased shortness of breath or cough but does state she thinks she has somewhat more edema of her lower extremities.  Family medical social history as been reviewed including admission note on 06/03/2014 as well as cardiology note on 06/06/2014-at that point she was thought to be stable and doing well.  Medications the been reviewed per [facility].  Review of systems.  In general she is not complaining of fever chills.  Respiratory does not complain of cough or shortness of breath.  Cardiac again a significant history of CHF and atrial fibrillation but does not complaining of chest pain.  GI say she has a good appetite does not complaining of abdominal discomfort nausea vomiting diarrhea or constipation.  Muscle skeletal does not complaining of joint pain.  Neurologic is not complaining of dizziness or headache.  Physical exam.  Temperatures 98.2 pulse of 80 respirations 18 blood pressure taken manually 112/70 weight  today is 164.1 this appears possibly a gain of about 1.5 pounds since yesterday and appears possibly a gain of four  pounds since August 30--but actually still down from weight back on August 24  In general this is a pleasant elderly female in no distress.  Her skin is warm and dry she does have solar induced changes diffusely.  Chest is clear to auscultation somewhat shallow air entry but no labored breathing or congestionnoted  Heart is irregular irregular rate and rhythm with a 2/6 systolic murmur.  Her abdomen somewhat obese soft nontender with positive bowel sounds.  Extremities she does venous stasis changes ? 2+edema with venous stasis changes there is some  mild erythema extending to below her knee-this is cool to touch.--non tender  Muscle skeletal moves lower extremities x4 does ambulate wheelchair.  Neurologic appears grossly intact I did not see any lateralizing findings is clear.  Psych she is alert and oriented pleasant and appropriate.  Labs.  06/14/2014.  Sodium 143 potassium 4 BUN 25 creatinine 0.63  06/10/2014.  Sodium 142 potassium 4.1 BUN 22 creatinine 0.63  06/03/2014.  WBC 5.4 hemoglobin 13.7  platelets 181  Assessment and plan.  #1-lower extremity edema-clinically she appears stable she's had some what appears some minimal weight gain.  I suspect this could be more dependent related edema-obtain venous Dopplers to rule out any DVT.  Otherwise encourage leg elevation and monitor for any increased  erythema or edema--continue to monitor weight closely.  .  Patient appears to be very comfortable does not complaining of any shortness of breath chest pain-  Also update metabolic panel next week--- we'll check a CBC with differential tomorrow  #2-atrial fibrillation-this appears rate controlled she is on Lopressor as well asEliquis for anticoagulation  Patient's status and plan of care was discussed with Dr. Dellia Nims via phone.  [id number]

## 2014-06-15 ENCOUNTER — Inpatient Hospital Stay (HOSPITAL_COMMUNITY)
Admit: 2014-06-15 | Discharge: 2014-06-15 | Disposition: A | Discharge status: Home / Self Care | Payer: Medicare Other | Attending: Internal Medicine | Admitting: Internal Medicine

## 2014-06-19 ENCOUNTER — Non-Acute Institutional Stay (SKILLED_NURSING_FACILITY): Payer: Medicare Other | Admitting: Internal Medicine

## 2014-06-19 ENCOUNTER — Ambulatory Visit (HOSPITAL_COMMUNITY): Payer: No Typology Code available for payment source | Attending: Internal Medicine

## 2014-06-19 DIAGNOSIS — I4891 Unspecified atrial fibrillation: Secondary | ICD-10-CM

## 2014-06-19 DIAGNOSIS — M899 Disorder of bone, unspecified: Secondary | ICD-10-CM | POA: Insufficient documentation

## 2014-06-19 DIAGNOSIS — I5041 Acute combined systolic (congestive) and diastolic (congestive) heart failure: Secondary | ICD-10-CM

## 2014-06-19 DIAGNOSIS — I509 Heart failure, unspecified: Secondary | ICD-10-CM

## 2014-06-19 DIAGNOSIS — J90 Pleural effusion, not elsewhere classified: Secondary | ICD-10-CM | POA: Insufficient documentation

## 2014-06-19 DIAGNOSIS — M949 Disorder of cartilage, unspecified: Secondary | ICD-10-CM

## 2014-06-26 NOTE — Progress Notes (Addendum)
Patient ID: Erica [name], female   DOB: 21-Jun-1917, 80 y.o.   MRN: [id number]               PROGRESS NOTE  DATE:  06/19/2014    FACILITY: [facility]    LEVEL OF CARE:   SNF   Acute Visit   CHIEF COMPLAINT:  Follow up congestive heart failure.    HISTORY OF PRESENT ILLNESS:  This is a 80 year-old woman who lived in her own home.  She was brought to the hospital with progressive shortness of breath, dyspnea, and orthopnea.  She was found to be in atrial fibrillation with rapid ventricular response.  Echocardiogram showed systolic dysfunction with an EF of 40-45%, diffuse hypokinesis, grade 2 diastolic dysfunction, severe left atrial enlargement, moderate TR, moderate to severe pulmonary hypertension.  She was put on Toprol XL and Eliquis.    When she first came in, I increased her Lasix to 40 mg a day.  Her discharge weight from the hospital was 170 pounds.  It appears that her weight has been stable at 164 pounds since her arrival here.    I note that she has been seen by Cardiology.  She was felt to be doing well.    REVIEW OF SYSTEMS:   CHEST/RESPIRATORY:  The patient is not complaining of shortness of breath,  although she is still on oxygen.    CARDIAC:   No complaints of chest pain.   GI:  No nausea, vomiting or diarrhea.       PHYSICAL EXAMINATION:   VITAL SIGNS:   O2 SATURATIONS:  95% on 2 L.   RESPIRATIONS:  18.   PULSE:  83 and regular.    GENERAL APPEARANCE:   The patient is up in her wheelchair.  Bright and alert.   CHEST/RESPIRATORY:  Shallow air entry bilaterally.    CARDIOVASCULAR:  CARDIAC:   Heart sounds are normal.  There is a 2/6 pansystolic murmur at the lower left sternal border.  JVP is not elevated with her sitting upright.   GASTROINTESTINAL:  ABDOMEN:   Distended.   LIVER/SPLEEN/KIDNEYS:  No liver, no spleen.  No tenderness.   CIRCULATION:   EDEMA/VARICOSITIES:  Extremities:  Severe venous stasis.  Some edema goes above the left leg  versus the right.    ASSESSMENT/PLAN:  Atrial fibrillation.  Now on Eliquis.  Her heart rate is controlled in the 80s.  She is on beta blockers.  Her weight has remained stable at around 164 pounds.      Hypoxemia.  I am going to attempt to wean her oxygen.    Left-sided pleural effusion.  This will need radiographic follow-up.

## 2014-06-28 ENCOUNTER — Encounter: Payer: Self-pay | Admitting: *Deleted

## 2014-07-22 ENCOUNTER — Encounter: Payer: Self-pay | Admitting: Cardiology

## 2014-07-22 ENCOUNTER — Non-Acute Institutional Stay (SKILLED_NURSING_FACILITY): Payer: Medicare Other | Admitting: Internal Medicine

## 2014-07-22 DIAGNOSIS — I4891 Unspecified atrial fibrillation: Secondary | ICD-10-CM

## 2014-07-22 DIAGNOSIS — I5041 Acute combined systolic (congestive) and diastolic (congestive) heart failure: Secondary | ICD-10-CM

## 2014-07-25 ENCOUNTER — Non-Acute Institutional Stay (SKILLED_NURSING_FACILITY): Payer: Medicare Other | Admitting: Internal Medicine

## 2014-07-25 DIAGNOSIS — I5041 Acute combined systolic (congestive) and diastolic (congestive) heart failure: Secondary | ICD-10-CM

## 2014-07-25 DIAGNOSIS — G40909 Epilepsy, unspecified, not intractable, without status epilepticus: Secondary | ICD-10-CM

## 2014-07-25 DIAGNOSIS — E039 Hypothyroidism, unspecified: Secondary | ICD-10-CM

## 2014-07-25 DIAGNOSIS — I4891 Unspecified atrial fibrillation: Secondary | ICD-10-CM

## 2014-07-25 NOTE — Progress Notes (Signed)
Patient ID: Erica [name], female   DOB: 1917-04-25, 80 y.o.   MRN: [id number]    FACILITY: [facility]  LEVEL OF CARE: SN THIS IS A ROUTINE VISIT   F  CHIEF COMPLAINT: Medical management of chronic medical conditions including CHF-atrial fibrillation-hypothyroidism-hypertension .  HISTORY OF PRESENT ILLNESS: This is a 80 year-old woman who lives in her own home. She presented with progressive shortness of breath, dyspnea, and orthopnea. She was admitted when she was found to have atrial fibrillation with rapid ventricular response. She was put on IV diltiazem, then switched to Toprol XL. She had systolic dysfunction on her echocardiogram with an EF of 40-45%, diffuse hypokinesis, grade 2 diastolic dysfunction, severe left atrial enlargement, moderate TR with moderate pulmonary hypertension. Her CHADS vascular score was 5. She was put on Toprol XL and Eliquis. On presentation, she had acute hypoxic respiratory failure. Her atrial fib was felt to be possibly paroxysmal.  Her TSH level was elevated. Synthroid was increased  She did relatively stable during her stay here-Dr. Dellia Nims did increase her Lasix-and her weight appears to have stabilized-she is complaining of a nonproductive cough-- does not complaining of any increased shortness of breath  .  PAST MEDICAL HISTORY/PROBLEM LIST: Includes:  Hypertension.  Hemorrhoids.  Thyroid disease.  Osteoarthritis.  Seizures.  Depression.  Hyperlipidemia.  Skin cancer.  PAST SURGICAL HISTORY: Includes:  Cataract extraction bilaterally.  Carpal tunnel release in the left hand.  Shoulder arthroscopy on the right.  Total hip arthroplasty on the right.  Total knee arthroplasty on the right.  Total knee arthroplasty on the left.  Cholecystectomy.  Breast cyst excision.  Abdominal hysterectomy.  Tonsillectomy.  Skin lesion excision.  Open reduction and internal fixation of the distal radius in 2014.  CURRENT MEDICATIONS  reviewed per MAR: .  SOCIAL HISTORY:  HOUSING: The patient t lives in [place].  FUNCTIONAL STATUS: She did have a fall history, but not since she used a walker at home. It sounds as though she has extensive in-home family support .  REVIEW OF SYSTEMS:  GENERAL: .  Does not complaining of any fever or chills  She does not really describe shortness of breath. Skin does not complaining of rashes or itching.  Head ears eyes nose mouth and throat-does not complaining of sore throat or eye discomfort or visual changes.  Respiratory-complains of cough nonproductive but does not complaining of shortness of breath says she feels somewhat congested when lying down  CARDIAC: No clear chest pain or palpitations.  GI-no complaint of nausea vomiting diarrhea or constipation.  Muscle skeletal-does not complaining of joint pain.  Neurologic does not complaining of headache or dizziness or syncopal-type feelings    PHYSICAL EXAMINATION:   Temperature 98.7 pulse 75 respirations 18 blood pressure 128/69 O2 saturation 92% on room air weight is 155.1 this has been stable for approximately 2 weeks  GENERAL APPEARANCE: The patient is alert, conversational Skin is warm and dry.--She has numerous sun-induced changes that appear to be chronic  Eyes pupils appear require reactive to light sclera and conjunctiva are clear l.  CHEST/RESPIRATORY: Shallow air entry bilaterally.--No labored breathing- cannot really appreciate chest congestion.  CARDIOVASCULAR:  CARDIAC: Heart sounds are regular-irregular. There is a 2/6 pansystolic murmur .--Has approximately 1+ lower extremity edema bilaterally  GASTROINTESTINAL:  ABDOMEN: Soft. Nontender with positive bowel sounds  CIRCULATION:  EDEMA/VARICOSITIES: Extremities: Probably some degree of venous stasis.  widespread solar-related skin damage in her arms and legs.  Muscle skeletal-moves all extremities  x4 currently ambulating in wheelchair.  Neurologic  appears grossly intact I could not appreciate any lateralizing findings   Labs.  06/19/2014.  Sodium 143 potassium 4.2 BUN 22 creatinine 0.57.  06/15/2014.  WBC 4.4 hemoglobin 11.7 platelets 157.  t.  ASSESSMENT/PLAN:  Atrial fibrillation, paroxysmal. Now on Eliquis. Her heart rate appears to be controlled most recently in the 60 to 70s range.--She is on Lopressor  Hypoxic respiratory failure. Secondary to diastolic heart failure and atrial fibrillation.  This appears stable currently but she remains portable individual O2 saturation is 92% on room air--she continues on Lasix with potassium supplementation  also on a beta blocker will update a metabolic panel.  Left-sided pleural effusion, with consideration of radiographic follow-up --L3 and chest x-ray-.  Cough-again will update a chest x-ray also start Mucinex 600 mg twice a day for 7 days and monitor  Hypothyroidism. On Synthroid will update TSH.  Seizure disorder she is on Dilantin as well as updated Dilantin level this has been stable during her stay here    depression-this appears relatively stable on Paxil  Of note will update a CBC as well as liver function tests were updated values  [id number]

## 2014-07-26 ENCOUNTER — Ambulatory Visit (HOSPITAL_COMMUNITY): Payer: No Typology Code available for payment source | Attending: Internal Medicine

## 2014-07-26 DIAGNOSIS — I10 Essential (primary) hypertension: Secondary | ICD-10-CM | POA: Insufficient documentation

## 2014-07-26 DIAGNOSIS — I4891 Unspecified atrial fibrillation: Secondary | ICD-10-CM | POA: Insufficient documentation

## 2014-07-26 DIAGNOSIS — R05 Cough: Secondary | ICD-10-CM | POA: Insufficient documentation

## 2014-07-28 ENCOUNTER — Encounter: Payer: Self-pay | Admitting: Internal Medicine

## 2014-07-29 ENCOUNTER — Non-Acute Institutional Stay (SKILLED_NURSING_FACILITY): Payer: Medicare Other | Admitting: Internal Medicine

## 2014-07-29 DIAGNOSIS — I5041 Acute combined systolic (congestive) and diastolic (congestive) heart failure: Secondary | ICD-10-CM

## 2014-07-29 DIAGNOSIS — I4891 Unspecified atrial fibrillation: Secondary | ICD-10-CM

## 2014-07-29 DIAGNOSIS — G40909 Epilepsy, unspecified, not intractable, without status epilepticus: Secondary | ICD-10-CM

## 2014-07-29 NOTE — Progress Notes (Addendum)
Patient ID: Osie Cheeks, female   DOB: 01-16-1917, 80 y.o.   MRN: [id number]               PROGRESS NOTE  DATE:  07/22/2014    FACILITY: [facility]    LEVEL OF CARE:   SNF   Acute Visit   CHIEF COMPLAINT:  Cough.    HISTORY OF PRESENT ILLNESS:  This is a 80 year-old woman who was admitted to hospital from 05/28/2014 through 05/31/2014 for new onset of atrial fibrillation.  During the time of the atrial fibrillation, she developed hypoxic  respiratory failure.  She has remained on oxygen.    She tells me that last Friday/early Saturday, she developed a barking cough.   She also has nasal congestion and some oropharyngeal irritation.    She also had a left-sided pleural effusion with consideration given for radiographic follow-up.    PHYSICAL EXAMINATION:    VITAL SIGNS:  O2 SATURATIONS:  95%, although her oxygen tank was empty.   PULSE:  67.   RESPIRATIONS:  18.   GENERAL APPEARANCE:  The patient does not look to be in any distress.   CHEST/RESPIRATORY:  Fairly clear air entry bilaterally.   CARDIOVASCULAR:  CARDIAC:   Heart sounds are regular.  Her JVP is not elevated.   HEENT:   MOUTH/THROAT:   Her oropharynx looks normal.    ASSESSMENT/PLAN:  Atrial fibrillation.  Heart rate is controlled.  She is on Toprol XL and Eliquis.    History of acute systolic and diastolic heart failure.  She does not appear to be that unstable.  Her weights have generally been stable in the 155.7 range since 07/12/2014.  She was 163.5 on 06/23/2014.    Probable viral illness.  She has been using Robitussin.  I will see if I can leave her something to help her rest at night.

## 2014-07-29 NOTE — Progress Notes (Signed)
Patient ID: Erica [name], female   DOB: [birth year], 80 y.o.   MRN: [id number]

## 2014-07-31 ENCOUNTER — Encounter: Payer: Self-pay | Admitting: Internal Medicine

## 2014-07-31 ENCOUNTER — Non-Acute Institutional Stay (SKILLED_NURSING_FACILITY): Payer: Medicare Other | Admitting: Internal Medicine

## 2014-07-31 DIAGNOSIS — J810 Acute pulmonary edema: Secondary | ICD-10-CM

## 2014-07-31 DIAGNOSIS — I4891 Unspecified atrial fibrillation: Secondary | ICD-10-CM

## 2014-07-31 DIAGNOSIS — G40909 Epilepsy, unspecified, not intractable, without status epilepticus: Secondary | ICD-10-CM

## 2014-07-31 DIAGNOSIS — E038 Other specified hypothyroidism: Secondary | ICD-10-CM

## 2014-07-31 DIAGNOSIS — J9601 Acute respiratory failure with hypoxia: Secondary | ICD-10-CM

## 2014-07-31 NOTE — Progress Notes (Signed)
Patient ID: Erica [name], female   DOB: May 31, 1917, 80 y.o.   MRN: [id number]   FACILITY: [facility]  LEVEL OF CARE: SN  This is  A discharge note   CHIEF COMPLAINT: Discharge note   .  HISTORY OF PRESENT ILLNESS :  This is a 80 year-old woman who lives in her own home. She presented with progressive shortness of breath, dyspnea, and orthopnea. She was admitted when she was found to have atrial fibrillation with rapid ventricular response. She was put on IV diltiazem, then switched to Toprol XL. She had systolic dysfunction on her echocardiogram with an EF of 40-45%, diffuse hypokinesis, grade 2 diastolic dysfunction, severe left atrial enlargement, moderate TR with moderate pulmonary hypertension. Her CHADS vascular score was 5. She was put on Toprol XL and Eliquis. On presentation, she had acute hypoxic respiratory failure. Her atrial fib was felt to be possibly paroxysmal.  Her TSH level was elevated. Synthroid was increased  She has relatively stable during her stay here-Dr. Dellia Nims did increase her Lasix-most recently earlier this week----last the last week she was complaining of a nonproductive cough-- she was started on Mucinex which she is completing-chest x-ray showed probable mild CHF with cardiomegaly small effusions and mild pulmonary vascular congestion She says the cough is somewhat better but still has some she feels  Mucinex is helping--denies any shortness of breath  Recent labs showed a depressed Dilantin level less than 2.5-he does have a history of seizure disorder Dr. Dellia Nims did increase her Dilantin dose.  She also has a history of an elevated TSH and Synthroid was recently increased as well  Patient will be returning home she will have 24-hour care and will need continued physical and occupational therapy .  PAST MEDICAL HISTORY/PROBLEM LIST: Includes:  Hypertension.  Hemorrhoids.  Thyroid disease.  Osteoarthritis.  Seizures.  Depression.    Hyperlipidemia.  Skin cancer.  PAST SURGICAL HISTORY: Includes:  Cataract extraction bilaterally.  Carpal tunnel release in the left hand.  Shoulder arthroscopy on the right.  Total hip arthroplasty on the right.  Total knee arthroplasty on the right.  Total knee arthroplasty on the left.  Cholecystectomy.  Breast cyst excision.  Abdominal hysterectomy.  Tonsillectomy.  Skin lesion excision.  Open reduction and internal fixation of the distal radius in 2014.  CURRENT MEDICATIONS reviewed per MAR:  .  SOCIAL HISTORY:  HOUSING: The patient t lives in [place].  FUNCTIONAL STATUS: She did have a fall history, but not since she used a walker at home. It sounds as though she has extensive in-home family support --and again will have 24-hour care .  REVIEW OF SYSTEMS:  GENERAL: .  Does not complaining of any fever or chills She does not really describe shortness of breath.  Skin does not complaining of rashes or itching.  Head ears eyes nose mouth and throat-does not complaining of sore throat or eye discomfort or visual changes.  Respiratory-still has somewhat of a cough although apparently improving-- but does not complaining of shortness of breath says she feels somewhat congested when lying down  CARDIAC: No clear chest pain or palpitations.  GI-no complaint of nausea vomiting diarrhea or constipation.  Muscle skeletal-does not complaining of joint pain.  Neurologic does not complaining of headache or dizziness or syncopal-type feelings   PHYSICAL EXAMINATION:   Temperature 98.4 pulse 93 respirations 20 blood pressure 134/57 weight is 150.2 this appears a loss of about 5 pounds over the past several days  GENERAL APPEARANCE  : The patient is alert, conversational   Skin is warm and dry.--She has numerous sun-induced changes that appear to be chronic   Eyes pupils appear require reactive to light sclera and conjunctiva are clear  l.  CHEST/RESPIRATORY: Shallow air  entry bilaterally.--No labored breathing- cannot really appreciate chest congestion.   CARDIOVASCULAR:  CARDIAC: Heart sounds are irregular-irregular. There is a 2/6 pansystolic murmur .--Has approximately 1+ lower extremity edema bilaterally--this appears relatively baseline   GASTROINTESTINAL:  ABDOMEN: Soft. Nontender with positive bowel sounds   CIRCULATION:  EDEMA/VARICOSITIES: Extremities: Probably some degree of venous stasis. widespread solar-related skin damage in her arms and legs .  Muscle skeletal-moves all extremities x4 barely ambulates with a walker with therapy-she is able to rise to a standing position without assistance but is quite weak   Neurologic appears grossly intact I could not appreciate any lateralizing findings-each is clear.  Psych she appears grossly alert and oriented pleasant and appropriate looking forward to going home   Labs 07/31/2014.  Sodium 139 potassium 3.8 BUN 21 creatinine 0.6.  07/26/2014.  TSH at 7.935  07/26/2014.  WBC 5.8 hemoglobin 12.7 platelets 181.  Sodium 141 potassium 3.4 BUN 21 creatinine 0.55.  Liver function tests within normal limits except alkaline phosphatase of 124 an albumin of 2.9.  Dilantin level less than 2.5.  06/19/2014.  Sodium 143 potassium 4.2 BUN 22 creatinine 0.57.  06/15/2014.  WBC 4.4 hemoglobin 11.7 platelets 157.  t.  ASSESSMENT/PLAN:  Atrial fibrillation, paroxysmal. Now on Eliquis--this appears rate controlled.  Her heart rate appears to be controlled most recently in the 60 to 70s range.--She is on Lopressor  Hypoxic respiratory failure. Secondary to diastolic heart failure and atrial fibrillation.  This appears stable currently but she remains fragile-----she continues on Lasix with potassium supplementation--both of these were increased earlier this week --will have home health redraw labs in several days with primary care provider notified for followup--I would be hesitant to wait longer than  the first lab date next week to draw this--- it appears she's lost approximately 4 pounds the past several days.  Left-sided pleural effusion, with consideration of radiographic follow-up -x-ray on October 16 showed small effusions and mild pulmonary vascular congestion probable mild CHF-minimal congestion-.  Cough-has completed a course of Mucinex as of today but will extend this for 5 additional days she states she's getting some relief with this  Hypothyroidism. On Synthroid--which was recently increased TSH has been ordered in about 5 weeks this will have to be followed by primary care provider.  Seizure disorder she is on Dilantin --no recent seizures but Dilantin level was quite low this has been increased by Dr. Dellia Nims this will have to be followed as well by primary care provider-will order a Dilantin level to be drawn by home health as well depression-this appears relatively stable on Paxil   Again she will be going home apparently with 24-hour care-will need continued PT and OT for strengthening since she still has significant weakness.     [id number]  Note  greater than 30 minutes spent on this discharge summary

## 2014-08-02 DIAGNOSIS — I272 Other secondary pulmonary hypertension: Secondary | ICD-10-CM

## 2014-08-02 DIAGNOSIS — R569 Unspecified convulsions: Secondary | ICD-10-CM

## 2014-08-02 DIAGNOSIS — I5032 Chronic diastolic (congestive) heart failure: Secondary | ICD-10-CM

## 2014-08-02 DIAGNOSIS — I482 Chronic atrial fibrillation: Secondary | ICD-10-CM

## 2014-08-05 NOTE — Progress Notes (Addendum)
Patient ID: Erica [name], female   DOB: 1916-11-13, 80 y.o.   MRN: [id number]               PROGRESS NOTE  DATE:  07/29/2014    FACILITY: [facility]    LEVEL OF CARE:   SNF   Acute Visit   CHIEF COMPLAINT:  Follow up CHF, other issues.    HISTORY OF PRESENT ILLNESS:  This is a 80 year-old woman whom we have had since the middle of August.    She was initially hospitalized for new onset atrial fibrillation.  During the time of the atrial fibrillation, she developed hypoxic  respiratory failure.  She remains on oxygen, although we have been able to wean this.    Over the last two weeks, she has continued to complain about a barking dry cough with nasal congestion.  I saw her on 07/22/2014, thought this was a viral illness.  She is still complaining of cough.  Probably as the result of this, she had a chest x-ray on 07/26/2014.  This showed mild CHF with cardiomegaly, small effusions, and mild pulmonary vascular congestion.  I have reviewed the chest x-ray.   I would agree with the interpretation other than the pleural effusion on the left actually looks moderate.  There are certainly Kerley B lines and some degree of vascular redistribution.  Also, she has a very prominent retrosternal airspace.  I suspect this lady probably has COPD.    REVIEW OF SYSTEMS:   CHEST/RESPIRATORY:  The patient complains of cough, but not excessive shortness of breath.  She is not bringing up any sputum.   CARDIAC:   No exertional chest pain.   GI:  No nausea, vomiting or diarrhea.    PHYSICAL EXAMINATION:   VITAL SIGNS:     O2 SATURATIONS:  99% on room air.    PULSE:  97 and atrial fibrillation.   RESPIRATIONS:  20 and unlabored.   GENERAL APPEARANCE:  The patient does not look to be in any distress.   CHEST/RESPIRATORY:  A few bibasilar crackles.    CARDIOVASCULAR:  CARDIAC:  Heart sounds are tachy.  There is no murmur heard.  No S3.  However, her jugular venous pressure is  elevated.   EDEMA/VARICOSITIES:  She has 2+ coccyx edema.  In the extremities, she has severe bilateral venous stasis.    ASSESSMENT/PLAN:  Probably biventricular heart failure.  Her atrial fibrillation is not well controlled.  Her weights, however, are fairly stable, 154.5 pounds.  She is on Lasix 40 mg once a day.  This will need to be increased.    Hypokalemia.  Her lab work from three days ago showed a potassium of 3.4.  BUN and creatinine were normal.    Dilantin level low at 2.5.  Her current dose of Dilantin is 100 mg once a day at bedtime.  For some reason, this was ordered repeated.  I do not really have any information on this.  However, her Dilantin is clearly going to need to be increased.  I will increase it to 100 b.i.d.    Significant pulmonary hypertension.  I am not exactly sure of the etiology of this.  She is also noted to have moderate tricuspid regurgitation.    Lasix dose will increase.  At this point, she does not appear to require oxygen.    The patient is supposed to go home this week.  She does not appear sick.   Hopefully, she has  good family support.

## 2014-08-06 ENCOUNTER — Encounter (HOSPITAL_COMMUNITY): Payer: Self-pay | Admitting: Emergency Medicine

## 2014-08-06 ENCOUNTER — Emergency Department (HOSPITAL_COMMUNITY)
Admission: EM | Admit: 2014-08-06 | Discharge: 2014-08-06 | Disposition: A | Discharge status: Home / Self Care | Payer: Medicare Other | Attending: Emergency Medicine | Admitting: Emergency Medicine

## 2014-08-06 DIAGNOSIS — I10 Essential (primary) hypertension: Secondary | ICD-10-CM | POA: Diagnosis not present

## 2014-08-06 DIAGNOSIS — Z85828 Personal history of other malignant neoplasm of skin: Secondary | ICD-10-CM | POA: Insufficient documentation

## 2014-08-06 DIAGNOSIS — I4891 Unspecified atrial fibrillation: Secondary | ICD-10-CM | POA: Diagnosis not present

## 2014-08-06 DIAGNOSIS — Z87828 Personal history of other (healed) physical injury and trauma: Secondary | ICD-10-CM | POA: Diagnosis not present

## 2014-08-06 DIAGNOSIS — Z8739 Personal history of other diseases of the musculoskeletal system and connective tissue: Secondary | ICD-10-CM | POA: Diagnosis not present

## 2014-08-06 DIAGNOSIS — Z791 Long term (current) use of non-steroidal anti-inflammatories (NSAID): Secondary | ICD-10-CM | POA: Insufficient documentation

## 2014-08-06 DIAGNOSIS — I5042 Chronic combined systolic (congestive) and diastolic (congestive) heart failure: Secondary | ICD-10-CM | POA: Insufficient documentation

## 2014-08-06 DIAGNOSIS — F329 Major depressive disorder, single episode, unspecified: Secondary | ICD-10-CM | POA: Insufficient documentation

## 2014-08-06 DIAGNOSIS — Z7901 Long term (current) use of anticoagulants: Secondary | ICD-10-CM | POA: Insufficient documentation

## 2014-08-06 DIAGNOSIS — E875 Hyperkalemia: Secondary | ICD-10-CM | POA: Diagnosis present

## 2014-08-06 DIAGNOSIS — Z79899 Other long term (current) drug therapy: Secondary | ICD-10-CM | POA: Diagnosis not present

## 2014-08-06 DIAGNOSIS — Z8781 Personal history of (healed) traumatic fracture: Secondary | ICD-10-CM | POA: Insufficient documentation

## 2014-08-06 DIAGNOSIS — E039 Hypothyroidism, unspecified: Secondary | ICD-10-CM | POA: Diagnosis not present

## 2014-08-06 DIAGNOSIS — G40909 Epilepsy, unspecified, not intractable, without status epilepticus: Secondary | ICD-10-CM | POA: Diagnosis not present

## 2014-08-06 DIAGNOSIS — R899 Unspecified abnormal finding in specimens from other organs, systems and tissues: Secondary | ICD-10-CM

## 2014-08-06 LAB — I-STAT CHEM 8, ED
BUN: 26 mg/dL — ABNORMAL HIGH (ref 6–23)
Calcium, Ion: 1.13 mmol/L (ref 1.13–1.30)
Chloride: 97 mEq/L (ref 96–112)
Creatinine, Ser: 0.7 mg/dL (ref 0.50–1.10)
Glucose, Bld: 135 mg/dL — ABNORMAL HIGH (ref 70–99)
HEMATOCRIT: 50 % — AB (ref 36.0–46.0)
HEMOGLOBIN: 17 g/dL — AB (ref 12.0–15.0)
Potassium: 4 mEq/L (ref 3.7–5.3)
SODIUM: 137 meq/L (ref 137–147)
TCO2: 29 mmol/L (ref 0–100)

## 2014-08-06 NOTE — ED Notes (Signed)
Pt was sent in to the e.d. Due to elevated potassium that her doctor called her at home about.  Pt was just dc'd from the hospital this week

## 2014-08-06 NOTE — Discharge Instructions (Signed)
Your potassium was not elevated. It was likely an error in the laboratory test. Follow up with the doctor as needed.
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
## 2014-08-13 ENCOUNTER — Inpatient Hospital Stay
Admission: RE | Admit: 2014-08-13 | Discharge: 2015-09-11 | Disposition: A | Discharge status: Other Acute Inpatient Hospital | Payer: Medicare Other | Source: Ambulatory Visit | Attending: Internal Medicine | Admitting: Internal Medicine

## 2014-08-13 DIAGNOSIS — R609 Edema, unspecified: Principal | ICD-10-CM

## 2014-08-13 DIAGNOSIS — R52 Pain, unspecified: Secondary | ICD-10-CM

## 2014-08-14 ENCOUNTER — Non-Acute Institutional Stay (SKILLED_NURSING_FACILITY): Payer: Medicare Other | Admitting: Internal Medicine

## 2014-08-14 DIAGNOSIS — I4891 Unspecified atrial fibrillation: Secondary | ICD-10-CM

## 2014-08-14 DIAGNOSIS — R059 Cough, unspecified: Secondary | ICD-10-CM

## 2014-08-14 DIAGNOSIS — R05 Cough: Secondary | ICD-10-CM

## 2014-08-14 DIAGNOSIS — I5042 Chronic combined systolic (congestive) and diastolic (congestive) heart failure: Secondary | ICD-10-CM

## 2014-08-18 NOTE — Progress Notes (Addendum)
Patient ID: Erica [name], female   DOB: 20-Mar-1917, 80 y.o.   MRN: [id number]               HISTORY & PHYSICAL  DATE:  08/14/2014    FACILITY: [facility]    LEVEL OF CARE:   SNF   CHIEF COMPLAINT:  Readmission to the facility for long-term care.  We had just discharged this lady probably 10 days ago.      HISTORY OF PRESENT ILLNESS:  This is a 80 year-old whom we initially admitted to the building in early September.  She had been brought to the hospital with progressive shortness of breath, dyspnea, and orthopnea.   She was found to be in atrial fibrillation with rapid ventricular response.  Echocardiogram showed systolic function of 34-19% with diffuse hypokinesis, grade 2 diastolic dysfunction, severe left atrial enlargement, moderate TR, and moderate to severe pulmonary hypertension.  She was put on Toprol XL and Eliquis.    We had to increase her Lasix in order to help with really disfiguring edema and probably biventricular heart failure.  Her last weight that I recorded on 07/29/2014 was 154.5 pounds on Lasix 40 mg.  I believe I increased this at that point.    The patient went home and there were apparently difficulties with her support.  Previously, she was cared for by a son-in-law, husband of her daughter who is deceased.  Apparently, the son-in-law has remarried and there was not the support network.  She has two children, both of whom live out of state, one in [place].  It was just not felt safe for her to be in her own home and apparently the patient was actually glad to be back here.  She is going to sell the home and stay here, possibly on the assisted living level.  She came in under [facility] level of care.    The patient is also stating that she has shortness of breath and perhaps some chest pain.  She also has a nonproductive cough that was also a problem when she was here last time.    PAST MEDICAL HISTORY/PROBLEM LIST:      Hypertension with LVH and grade 2  diastolic dysfunction.    Hemorrhoids.    Thyroid disease.    Osteoarthritis.    History of a seizure disorder.     History of depression.    Hyperlipidemia.    History of skin cancer.    PAST SURGICAL HISTORY:    Cataract extraction bilaterally.    Carpal tunnel release in the left hand.    Shoulder arthroplasty on the right.    Total hip arthroplasty on the right.    Total knee arthroplasty on the right.    Total knee arthroplasty on the left.    Cholecystectomy.    Cyst excision.    Abdominal hysterectomy.    Tonsillectomy.    Skin cancer excision.    Open reduction and internal fixation of the distal radius in 2014.    CURRENT MEDICATIONS:  Medication list is reviewed and are the same ones that I had discharged her on.    Tylenol plain 650 q.6 p.r.n.    Eliquis 2.5 b.i.d.    Lasix 80 mg once a day.    Synthroid 175 mcg daily.    Lisinopril 2.5 q.d.    Metoprolol extended-release 50 mg once a day.    Paxil 20 mg once a day.    Vitamin D3, 2000 U once  a day.    Dilantin 100 mg b.i.d.   KCl 40 mEq a day.    SOCIAL HISTORY:   HOUSING:  As mentioned, the patient was living in her own home before the hospitalization when she first came here, and we discharged her home.  Apparently, the social support network that was there previously was just not there and this may have more to do with her readmission here than anything else.   CODE STATUS:  The patient is a DNR.   FUNCTIONAL STATUS:  She walks with a walker and none of that seems to have really changed.    FAMILY HISTORY:  None related by the patient.     REVIEW OF SYSTEMS:   GENERAL:  The patient states she feels short of breath and weak, but is not complaining of acute illness.   CHEST/RESPIRATORY:  She again complains of the cough, which is nonproductive.  Some shortness of breath.   CARDIAC:   She is complaining of some shortness of breath and some chest pain.  Grips her upper left chest.   States this is almost a constant sensation (does not really sound exertional or anginal-like) although, nevertheless, this is concerning.     GI:  There is some degree of constipation.    PHYSICAL EXAMINATION:   VITAL SIGNS:   TEMPERATURE:  97.4.   PULSE:  137.   RESPIRATIONS:  23.   BLOOD PRESSURE:  158/94.   WEIGHT:  159 pounds, which is slightly more than her discharge weight here where her weight was 150.2 pounds.  At that point, she had lost 5 pounds.   GENERAL APPEARANCE:  The patient is not in any distress.  Looks somewhat despondent.   CHEST/RESPIRATORY:  She has crackles in the right lower lobe greater than left lower lobe.  The upper lobes and right middle lobe appear to be clear.  There is no wheezing.  No accessory muscle use.   CARDIOVASCULAR:  CARDIAC:  Heart sounds are tachy.  There are no obvious murmurs, although she has known tricuspid regurgitation.  Her JVP is elevated at 45, although she does not have a Cannon v-wave.      GASTROINTESTINAL:  ABDOMEN:   Slightly distended.  There is no palpable mass.   LIVER/SPLEEN/KIDNEYS:  No liver, no spleen palpable.   GENITOURINARY:  BLADDER:   No bladder distention.   CIRCULATION:   EDEMA/VARICOSITIES:  Extremities:  She has severe venous stasis and edema below the knee, but not above the knees.  She does have 2+ coccyx edema, however.    ASSESSMENT/PLAN:  Possibly biventricular heart failure.  She has moderate to severe pulmonary hypertension.  I do not want to over-diurese this woman.  However, she appears to have gained 9 pounds since she left here.  Previous echocardiogram done in August of this year showed an EF of 86-76%, grade 2 diastolic dysfunction, moderate LVH.  Pulmonary artery systolic pressure was 59.    Atrial fibrillation.  Her heart rate is not under good control.  I am going to try to increase the beta blockade here.     Cough.  She does not complain of obvious postnasal drip or gastroesophageal reflux.  She is  on ACE inhibitors.  She did have a cough the last time she was here.  I do not remember the exact timing of when ACE inhibitors were started.  She was, however, on lisinopril 2.5.  I think there is enough evidence to stop this at this  point.    Severe bilateral venous stasis.  I will order TED hose on her.    History of depression.  I think she is situationally depressed as she is giving up her home and moving back into the facility for ongoing care, possibly ultimately at assisted living.    Seizure disorder.  Her Dilantin level was low during her stay here last time.  I think I increased this to 200 mg a day and she is indeed on 100 b.i.d.     Adjustments will be made to this lady's medications.   Lisinopril will be put on hold (?ACE inhibitor induced cough).  Her metoprolol and Lasix will be increased.

## 2014-09-19 ENCOUNTER — Ambulatory Visit: Payer: Medicare Other | Admitting: Cardiology

## 2014-10-09 ENCOUNTER — Non-Acute Institutional Stay (SKILLED_NURSING_FACILITY): Payer: Medicare Other | Admitting: Internal Medicine

## 2014-10-09 DIAGNOSIS — E038 Other specified hypothyroidism: Secondary | ICD-10-CM

## 2014-10-09 DIAGNOSIS — I4891 Unspecified atrial fibrillation: Secondary | ICD-10-CM

## 2014-10-09 DIAGNOSIS — I272 Pulmonary hypertension, unspecified: Secondary | ICD-10-CM

## 2014-10-09 DIAGNOSIS — I270 Primary pulmonary hypertension: Secondary | ICD-10-CM

## 2014-10-09 DIAGNOSIS — G40909 Epilepsy, unspecified, not intractable, without status epilepticus: Secondary | ICD-10-CM

## 2014-10-09 NOTE — Progress Notes (Signed)
Patient ID: Erica [name], female   DOB: 08-Jan-1917, 80 y.o.   MRN: [id number]                     :       FACILITY: [facility]    LEVEL OF CARE:   SNF  This is a routine visit.     CHIEF COMPLAINT: Medical management of chronic medical conditions including CHF-atrial fibrillation-hypothyroidism.      HISTORY OF PRESENT ILLNESS:  This is a 80 year-old whom we initially admitted to the building in early September.  She had been brought to the hospital with progressive shortness of breath, dyspnea, and orthopnea.   She was found to be in atrial fibrillation with rapid ventricular response.  Echocardiogram showed systolic function of 25-95% with diffuse hypokinesis, grade 2 diastolic dysfunction, severe left atrial enlargement, moderate TR, and moderate to severe pulmonary hypertension.  She was put on Toprol XL and Eliquis  Patient went home for short while-however apparently there was not a support network in place to address all of her needs-and she returned to the facility apparently 10 days later-Dr. Dellia Nims was concerned about some weight gain and edema and did increase her Lasix she is currently on 100 mg a day.  He also increased her beta blocker secondary to elevated heart rate.  Currently her atrial fibrillation appears to be rate controlled on Lopressor 75 mg twice a day blood pressures appear to have been stable as well ranging from 105/62-132/60 recently.--I got 104/60 manually this evening  There has been some question about weight gain most recent weight today was 155-however per reviewed this does not appear to be a large weight gain it appears she has run in the lower 150s 152-153 previously.  She does not complain of any shortness of breath even with exertion-she does not have any complaints this evening.  She does have a history of hypothyroidism-she is on Synthroid this was increased last month to 200 g a day TSH in early November was 10.96-did  come down to 6.669 on December 7 we will recheck this at appears to be trending in the right direction.              PAST MEDICAL HISTORY/PROBLEM LIST:      Hypertension with LVH and grade 2 diastolic dysfunction.    Hemorrhoids.    Thyroid disease.    Osteoarthritis.    History of a seizure disorder.     History of depression.    Hyperlipidemia.    History of skin cancer.    PAST SURGICAL HISTORY:    Cataract extraction bilaterally.    Carpal tunnel release in the left hand.    Shoulder arthroplasty on the right.    Total hip arthroplasty on the right.    Total knee arthroplasty on the right.    Total knee arthroplasty on the left.    Cholecystectomy.    Cyst excision.    Abdominal hysterectomy.    Tonsillectomy.    Skin cancer excision.    Open reduction and internal fixation of the distal radius in 2014.    CURRENT MEDICATIONS:  Medication list is reviewed and are the same ones that I had discharged her on.    Tylenol plain 650 q.6 p.r.n.    Eliquis 2.5 b.i.d.    Lasix 80 mg once a day.    Synthroid2 00 mcg daily. .    Metoprolol extended-release 75 mg once a day.  Paxil 20 mg once a day.    Vitamin D3, 2000 U once a day.    Dilantin 100 mg T.i.d.   KCl 40 mEq a day.    SOCIAL HISTORY:   HOUSING:  As mentioned, the patient was living in her own home before the hospitalization when she first came here, and we discharged her home.  Apparently, the social support network that was there previously was just not there and this may have more to do with her readmission here than anything else.   CODE STATUS:  The patient is a DNR.   Marland Kitchen    FAMILY HISTORY:  None related by the patient.     REVIEW OF SYSTEMS In general no complaints of fever or chills her weight appears to be relatively stable will have to be monitored however.  Skin does not complain of rashes or itching has numerous solar induced changes which are chronic.  Head ears  eyes nose mouth and throat --does not complain of sore throat or visual changes.  Respiratory--does not complain of cough or shortness of breath this evening says she does not get short of breath recently ambulating in her wheelchair-she had been short of breath on admission she said.  GI does not complain of any abdominal discomfort nausea vomiting diarrhea or constipation.  Musculoskeletal is not complaining of any joint pain.  Neurologic does not complain of dizziness headache or syncopal-type episodes.  Psych-apparently at times does have some anxiety-appears to be fully cognizant:        PHYSICAL EXAMINATION:   Temperature 97.3 pulse 75 respirations 20 blood pressure 104/60-weight is 155 again appears to run in the low mid 150s .   GENERAL APPEARANCE:  The patient is not in any distress.  Comfortably in a wheelchair.  Her skin is warm and dry does have numerous solar induced changes face upper extremities especially.  Eyes pupils appear reactive to light sclera and conjunctiva clear.  Oropharynx clear mucous membranes moist.  Chest is clear to auscultation there is no labored breathing somewhat shallow air entry   Heart is regular rate and rhythm without murmur gallop or rub she appears to have 1+ lower extremity edema this appears to be relatively baseline--with baseline venous stasis changes--does not really appear to have significant coccyx edema Abdomen is soft nontender with positive bowel sounds is somewhat protuberant  Musculoskeletal-I did not note any deformities other than arthritic changes she ambulates in a wheelchair largely strength appears to be intact all 4 extremities with some lower extremity weakness I suspect his baseline.  Neurologic is grossly intact no lateralizing findings cranial nerves intact speech is clear.  Psych she is alert and oriented pleasant and appropriate.  Labs.  09/16/2014.  TSH 6.669.  08/30/2014 Dilantin level  XVI.9.    08/15/2014.  RWE-31.540.  WBC 5.9 hemoglobin 14.8.  Sodium 139 potassium 3.5 BUN 16 creatinine 0.6.  Alkaline phosphatase 152 and albumin 3.3 otherwise liver function tests within normal limits.  Dilantin level III.1.   Marland Kitchen       Marland Kitchen    r.    ASSESSMENT/PLAN:  Possibly biventricular heart failure.  She has moderate to severe pulmonary hypertension.  .  Clinically she appears stable-appears to have possibly some mild weight gain although there is scale fluctuation here-at this point continue to monitor closely and notify provider of any continued weight gain-she is denying any shortness of breath-- edema does not appear grossly changed.  Reweigh tomorrow notify provider of any weight gain  Previous echocardiogram done in August of this year showed an EF of 94-49%, grade 2 diastolic dysfunction, moderate LVH.  Pulmonary artery systolic pressure was 59.      Atrial fibrillation.  This appears rate controlled on current dose of metoprolol-she is on Eliquis for anticoagulation     Cough. --This appears to be improved since lisinopril was discontinued      Severe bilateral venous stasis.  This appears to be at baseline she has TED hose    History of depression. --This appears to be stable she is on Paxil-initially had some subdued episodes on initial coming back to facility-but she appears to have adapted well--she understands the need for increased care at this point       Seizure disorder.   Her Dilantin level initially was low on readmission-her Dilantin was increased and this has normalized will update a Dilantin level.    Hypothyroidism-she continues on Synthroid again this was recently increased-TSH is trending into normal range will recheck this as well.    She will need an updated metabolic panel and CBC as well and vitamin D level.    [id number].     ' .

## 2014-10-28 ENCOUNTER — Non-Acute Institutional Stay (SKILLED_NURSING_FACILITY): Payer: Medicare Other | Admitting: Internal Medicine

## 2014-10-28 DIAGNOSIS — I270 Primary pulmonary hypertension: Secondary | ICD-10-CM

## 2014-10-28 DIAGNOSIS — I272 Pulmonary hypertension, unspecified: Secondary | ICD-10-CM

## 2014-10-28 DIAGNOSIS — R8271 Bacteriuria: Secondary | ICD-10-CM

## 2014-10-28 DIAGNOSIS — N390 Urinary tract infection, site not specified: Secondary | ICD-10-CM

## 2014-10-29 ENCOUNTER — Encounter: Payer: Self-pay | Admitting: Cardiology

## 2014-10-29 ENCOUNTER — Ambulatory Visit (INDEPENDENT_AMBULATORY_CARE_PROVIDER_SITE_OTHER): Payer: Medicare Other | Admitting: Cardiology

## 2014-10-29 VITALS — BP 112/60 | HR 67 | Ht 66.0 in | Wt 152.0 lb

## 2014-10-29 DIAGNOSIS — I5023 Acute on chronic systolic (congestive) heart failure: Secondary | ICD-10-CM

## 2014-10-29 DIAGNOSIS — I4891 Unspecified atrial fibrillation: Secondary | ICD-10-CM

## 2014-10-29 MED ORDER — TORSEMIDE 20 MG PO TABS: 40.0000 mg | ORAL_TABLET | Freq: Every day | ORAL | Status: DC
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
## 2014-10-29 NOTE — Progress Notes (Addendum)
Patient ID: Erica [name], female   DOB: 04-10-1917, 81 y.o.   MRN: [id number]               PROGRESS NOTE  DATE:  10/28/2014                  FACILITY: [facility]                LEVEL OF CARE:   SNF   Acute Visit   CHIEF COMPLAINT:  Urine culture done, positive for Proteus.    HISTORY OF PRESENT ILLNESS:  This is a frail, 81 year-old woman whom we initially had in the building in early September.  The patient had been found to have atrial fibrillation with rapid ventricular response, probable ischemic cardiomyopathy with an ejection fraction of 74-94%, grade 2 diastolic heart failure, severe left atrial enlargement, moderate TR, and moderate to severe pulmonary hypertension.  We were able to taper her off her oxygen.  She has had some adjustments to her diuretics.    She was also on Dilantin with a history of seizure disorder.  Her Dilantin level was recently checked at 18.9, which she seems to tolerate fairly well although her dose was reduced.  I am not exactly sure of the issue here.    Lab work from the same date showed an alk phos of 189, an AST of 40, and an ALT of 39.    Her albumin was 3.5.    Staff have noted her to have a foul-smelling urine, although there is no other indication of a UTI; specifically, no fever, no complaints of dysuria (although she is incontinent).  She is eating and drinking well.  No signs of a urinary tract infection.    CURRENT MEDICATIONS:  Medication list is reviewed.     Dilantin 50 mg in the morning and 100 mg later in the day.    Eliquis 2.5 b.i.d.      Lasix 100 mg a day.    Metoprolol XL 75 a day.    MiraLAX 17 g a day.    Paxil 20 q.d.      Synthroid 200 q.d.     Vitamin D3, 2000 U daily.     REVIEW OF SYSTEMS:                           CHEST/RESPIRATORY:  The patient is not complaining of shortness of breath.   CARDIAC:   No chest pain.     GI:  No nausea or vomiting.   GU:  No dysuria.                 PHYSICAL EXAMINATION:   VITAL SIGNS:   O2 SATURATIONS:  99% on room air.    RESPIRATIONS:  18 and unlabored.    PULSE:  58 and regular.   GENERAL APPEARANCE:  The patient looks very stable.   CHEST/RESPIRATORY:  Shallow, but otherwise clear air entry bilaterally.    CARDIOVASCULAR:  CARDIAC:  2/6 pansystolic murmur.  Her JVP is slightly elevated at 90.   GENITOURINARY:  BLADDER:   Not obviously distended.  There is no CVA tenderness and no bladder tenderness noted.    ASSESSMENT/PLAN:                           Asymptomatic bacteruria.    Congestive heart failure.   Probably right greater  than left.  She has edema which is moderate up to above her legs, although I really do not want to push her diuretics anymore at this point.  She has severe pulmonary hypertension. I suspect she has worse       Right sided heart failure than left. I don't want to aggressively over  Diurese her    Atrial fibrillation.  Her heart rate is controlled.  She is anticoagulated.    At this point, I do not see any reason to consider antibiotics.  We will have her push fluids.

## 2014-10-29 NOTE — Patient Instructions (Addendum)
Your physician recommends that you schedule a follow-up appointment in: 1 month with Jory Sims NP     STOP Lasix   START Torsemide 40 mg daily    Please get lab work done in 2 weeks (BMET,Magnesium)    Thank you for choosing [facility] !

## 2014-11-17 ENCOUNTER — Encounter (HOSPITAL_COMMUNITY)
Admission: RE | Admit: 2014-11-17 | Discharge: 2014-11-17 | Disposition: A | Discharge status: Home / Self Care | Payer: Medicare Other | Source: Skilled Nursing Facility | Attending: Internal Medicine | Admitting: Internal Medicine

## 2014-11-18 ENCOUNTER — Encounter (HOSPITAL_COMMUNITY)
Admission: RE | Admit: 2014-11-18 | Discharge: 2014-11-18 | Disposition: A | Discharge status: Home / Self Care | Payer: Medicare Other | Source: Skilled Nursing Facility | Attending: Internal Medicine | Admitting: Internal Medicine

## 2014-11-18 DIAGNOSIS — E876 Hypokalemia: Secondary | ICD-10-CM | POA: Diagnosis not present

## 2014-11-18 DIAGNOSIS — I4891 Unspecified atrial fibrillation: Secondary | ICD-10-CM | POA: Diagnosis not present

## 2014-11-18 DIAGNOSIS — E785 Hyperlipidemia, unspecified: Secondary | ICD-10-CM | POA: Insufficient documentation

## 2014-11-18 LAB — BASIC METABOLIC PANEL
ANION GAP: 5 (ref 5–15)
BUN: 26 mg/dL — AB (ref 6–23)
CHLORIDE: 100 mmol/L (ref 96–112)
CO2: 32 mmol/L (ref 19–32)
CREATININE: 0.74 mg/dL (ref 0.50–1.10)
Calcium: 8.3 mg/dL — ABNORMAL LOW (ref 8.4–10.5)
GFR calc Af Amer: 81 mL/min — ABNORMAL LOW (ref >90)
GFR calc non Af Amer: 70 mL/min — ABNORMAL LOW (ref >90)
GLUCOSE: 101 mg/dL — AB (ref 70–99)
POTASSIUM: 3.1 mmol/L — AB (ref 3.5–5.1)
Sodium: 137 mmol/L (ref 135–145)

## 2014-11-18 LAB — PHENYTOIN LEVEL, TOTAL: PHENYTOIN LVL: 12.7 ug/mL (ref 10.0–20.0)

## 2014-11-22 ENCOUNTER — Encounter (HOSPITAL_COMMUNITY)
Admission: RE | Admit: 2014-11-22 | Discharge: 2014-11-22 | Disposition: A | Discharge status: Home / Self Care | Payer: Medicare Other | Source: Skilled Nursing Facility | Attending: Internal Medicine | Admitting: Internal Medicine

## 2014-11-22 ENCOUNTER — Other Ambulatory Visit (HOSPITAL_COMMUNITY)
Admission: RE | Admit: 2014-11-22 | Discharge: 2014-11-22 | Disposition: A | Discharge status: Home / Self Care | Payer: Medicare Other | Source: Ambulatory Visit | Attending: Internal Medicine | Admitting: Internal Medicine

## 2014-11-22 DIAGNOSIS — I10 Essential (primary) hypertension: Secondary | ICD-10-CM | POA: Diagnosis not present

## 2014-11-22 DIAGNOSIS — E039 Hypothyroidism, unspecified: Secondary | ICD-10-CM | POA: Insufficient documentation

## 2014-11-22 DIAGNOSIS — I4891 Unspecified atrial fibrillation: Secondary | ICD-10-CM | POA: Insufficient documentation

## 2014-11-22 LAB — BASIC METABOLIC PANEL
Anion gap: 6 (ref 5–15)
BUN: 29 mg/dL — AB (ref 6–23)
CALCIUM: 8.5 mg/dL (ref 8.4–10.5)
CHLORIDE: 103 mmol/L (ref 96–112)
CO2: 32 mmol/L (ref 19–32)
CREATININE: 0.75 mg/dL (ref 0.50–1.10)
GFR, EST AFRICAN AMERICAN: 81 mL/min — AB (ref >90)
GFR, EST NON AFRICAN AMERICAN: 70 mL/min — AB (ref >90)
GLUCOSE: 101 mg/dL — AB (ref 70–99)
Potassium: 3.8 mmol/L (ref 3.5–5.1)
Sodium: 141 mmol/L (ref 135–145)

## 2014-11-28 ENCOUNTER — Encounter: Payer: Medicare Other | Admitting: Adult Health

## 2014-11-28 ENCOUNTER — Other Ambulatory Visit (HOSPITAL_COMMUNITY)
Admission: AD | Admit: 2014-11-28 | Discharge: 2014-11-28 | Disposition: A | Discharge status: Home / Self Care | Payer: Medicare Other | Source: Skilled Nursing Facility | Attending: Internal Medicine | Admitting: Internal Medicine

## 2014-11-28 ENCOUNTER — Encounter: Payer: Self-pay | Admitting: Adult Health

## 2014-11-28 DIAGNOSIS — E876 Hypokalemia: Secondary | ICD-10-CM | POA: Insufficient documentation

## 2014-11-28 DIAGNOSIS — E039 Hypothyroidism, unspecified: Secondary | ICD-10-CM | POA: Diagnosis not present

## 2014-11-28 DIAGNOSIS — I10 Essential (primary) hypertension: Secondary | ICD-10-CM | POA: Diagnosis present

## 2014-11-28 LAB — PHENYTOIN LEVEL, TOTAL: Phenytoin Lvl: 11.9 ug/mL (ref 10.0–20.0)

## 2014-11-28 NOTE — Progress Notes (Signed)
Name: Erica [name]    DOB: 07-25-1917  Age: 81 y.o.  MR#: [id number]       PCP:  Woody Seller, MD      Insurance: Payor: MEDICARE / Plan: MEDICARE PART A AND B / Product Type: *No Product type* /   CC:    Chief Complaint  Patient presents with  . Atrial Fibrillation  . Congestive Heart Failure  . Hypertension    VS Filed Vitals:   11/28/14 1337  BP: 96/66  Pulse: 68  Height: 5\' 6"  (1.676 m)  Weight: 152 lb (68.947 kg)  SpO2: 98%    Weights Current Weight  11/28/14 152 lb (68.947 kg)  10/29/14 152 lb (68.947 kg)  08/06/14 150 lb (68.04 kg)    Blood Pressure  BP Readings from Last 3 Encounters:  11/28/14 96/66  10/29/14 112/60  08/06/14 135/89     Admit date:  (Not on file) Last encounter with RMR:  06/07/2014   Allergy Clarithromycin; Ditropan; Prednisone; Vioxx; Biaxin; Erythromycin; Keflex; and Macrodantin  Current Outpatient Prescriptions  Medication Sig Dispense Refill  . torsemide (DEMADEX) 20 MG tablet Take 2 tablets (40 mg total) by mouth daily. 60 tablet 3   No current facility-administered medications for this visit.    Discontinued Meds:   There are no discontinued medications.  Patient Active Problem List   Diagnosis Date Noted  . Edema 06/14/2014  . Moderate tricuspid regurgitation 05/29/2014  . Moderate to severe pulmonary hypertension 05/29/2014  . Atrial fibrillation with RVR 05/28/2014  . Acute pulmonary edema 05/28/2014  . Acute systolic and diastolic CHF 73/71/0626  . Acute respiratory failure with hypoxia 05/28/2014  . Vertigo 02/05/2013  . C2 cervical fracture 02/05/2013  . UTI (urinary tract infection) 03/30/2012  . Generalized weakness 03/30/2012  . Hypokalemia 03/30/2012  . Dehydration 03/30/2012  . Hypertension 03/30/2012  . Hypothyroidism 03/30/2012  . Rhabdomyolysis 03/30/2012  . Seizure disorder 03/30/2012  . Hyperlipidemia 03/30/2012  . Fall 03/30/2012  . Skin lesion of left leg 10/27/2011  . History of  squamous cell carcinoma, right leg, left leg excised 12/21/2011 06/25/2011    LABS    Component Value Date/Time   NA 141 11/22/2014 0600   NA 137 11/18/2014 0630   NA 137 08/06/2014 2027   K 3.8 11/22/2014 0600   K 3.1* 11/18/2014 0630   K 4.0 08/06/2014 2027   CL 103 11/22/2014 0600   CL 100 11/18/2014 0630   CL 97 08/06/2014 2027   CO2 32 11/22/2014 0600   CO2 32 11/18/2014 0630   CO2 32 05/31/2014 0620   GLUCOSE 101* 11/22/2014 0600   GLUCOSE 101* 11/18/2014 0630   GLUCOSE 135* 08/06/2014 2027   BUN 29* 11/22/2014 0600   BUN 26* 11/18/2014 0630   BUN 26* 08/06/2014 2027   CREATININE 0.75 11/22/2014 0600   CREATININE 0.74 11/18/2014 0630   CREATININE 0.70 08/06/2014 2027   CALCIUM 8.5 11/22/2014 0600   CALCIUM 8.3* 11/18/2014 0630   CALCIUM 8.9 05/31/2014 0620   GFRNONAA 70* 11/22/2014 0600   GFRNONAA 70* 11/18/2014 0630   GFRNONAA 76* 05/31/2014 0620   GFRAA 81* 11/22/2014 0600   GFRAA 81* 11/18/2014 0630   GFRAA 88* 05/31/2014 0620   CMP     Component Value Date/Time   NA 141 11/22/2014 0600   K 3.8 11/22/2014 0600   CL 103 11/22/2014 0600   CO2 32 11/22/2014 0600   GLUCOSE 101* 11/22/2014 0600   BUN 29* 11/22/2014 0600   CREATININE  0.75 11/22/2014 0600   CALCIUM 8.5 11/22/2014 0600   PROT 5.6* 04/02/2012 0645   ALBUMIN 2.5* 04/02/2012 0645   AST 40* 04/02/2012 0645   ALT 33 04/02/2012 0645   ALKPHOS 71 04/02/2012 0645   BILITOT 0.5 04/02/2012 0645   GFRNONAA 70* 11/22/2014 0600   GFRAA 81* 11/22/2014 0600       Component Value Date/Time   WBC 5.6 05/28/2014 1000   WBC 9.2 04/07/2013 1838   WBC 6.0 02/06/2013 0700   HGB 17.0* 08/06/2014 2027   HGB 14.5 05/28/2014 1000   HGB 14.3 04/07/2013 1956   HCT 50.0* 08/06/2014 2027   HCT 43.5 05/28/2014 1000   HCT 42.0 04/07/2013 1956   MCV 95.0 05/28/2014 1000   MCV 94.9 04/07/2013 1838   MCV 91.5 02/06/2013 0700    Lipid Panel  No results found for: CHOL, TRIG, HDL, CHOLHDL, VLDL, LDLCALC,  LDLDIRECT  ABG    Component Value Date/Time   TCO2 29 08/06/2014 2027     Lab Results  Component Value Date   TSH 12.620* 05/28/2014   BNP (last 3 results) No results for input(s): BNP in the last 8760 hours.  ProBNP (last 3 results)  Recent Labs  05/28/14 1000  PROBNP 1935.0*    Cardiac Panel (last 3 results) No results for input(s): CKTOTAL, CKMB, TROPONINI, RELINDX in the last 72 hours.  Iron/TIBC/Ferritin/ %Sat No results found for: IRON, TIBC, FERRITIN, IRONPCTSAT   EKG Orders placed or performed during the hospital encounter of 05/28/14  . EKG 12-Lead  . EKG 12-Lead  . EKG     Prior Assessment and Plan Problem List as of 11/28/2014      Cardiovascular and Mediastinum   Hypertension   Last Assessment & Plan 06/07/2014 Office Visit Written 06/07/2014  3:50 PM by Lendon Colonel, NP    Good control of BP. NO changes in medical regimen.      Atrial fibrillation with RVR   Last Assessment & Plan 06/07/2014 Office Visit Written 06/07/2014  3:50 PM by Lendon Colonel, NP    HR is well controlled.She is tolerating Eliquis without melena or overt bleeding. She has some bruising which I have explained to her is normal. Will see her again in 3 months.      Acute systolic and diastolic CHF   Last Assessment & Plan 06/07/2014 Office Visit Written 06/07/2014  3:49 PM by Lendon Colonel, NP    No evidence of decompensation. Weight is stable. She is adhering to low sodium diet at [facility]. She is actually enjoying her stay there. She will continue current medical regimen without changes. No labs will be drawn today as labs were drawn one week ago and were WNL.  Will see her in 3 months.       Moderate tricuspid regurgitation   Moderate to severe pulmonary hypertension     Respiratory   Acute pulmonary edema   Acute respiratory failure with hypoxia     Endocrine   Hypothyroidism     Nervous and Auditory   Seizure disorder     Musculoskeletal and Integument    Skin lesion of left leg   Rhabdomyolysis   C2 cervical fracture     Genitourinary   UTI (urinary tract infection)     Other   History of squamous cell carcinoma, right leg, left leg excised 12/21/2011   Generalized weakness   Hypokalemia   Dehydration   Hyperlipidemia   Fall   Vertigo  Edema       Imaging: No results found.

## 2014-11-28 NOTE — Patient Instructions (Signed)
Your physician wants you to follow-up in: 6 months You will receive a reminder letter in the mail two months in advance. If you don't receive a letter, please call our office to schedule the follow-up appointment.     Your physician recommends that you continue on your current medications as directed. Please refer to the Current Medication list given to you today.      Thank you for choosing [facility] !        

## 2014-11-28 NOTE — Progress Notes (Signed)
    Error NO Show

## 2014-12-14 ENCOUNTER — Encounter: Payer: Self-pay | Admitting: Internal Medicine

## 2014-12-14 ENCOUNTER — Non-Acute Institutional Stay (SKILLED_NURSING_FACILITY): Payer: Medicare Other | Admitting: Internal Medicine

## 2014-12-14 DIAGNOSIS — E876 Hypokalemia: Secondary | ICD-10-CM | POA: Diagnosis not present

## 2014-12-14 DIAGNOSIS — I4891 Unspecified atrial fibrillation: Secondary | ICD-10-CM

## 2014-12-14 DIAGNOSIS — C4491 Basal cell carcinoma of skin, unspecified: Secondary | ICD-10-CM | POA: Diagnosis not present

## 2014-12-14 DIAGNOSIS — I5041 Acute combined systolic (congestive) and diastolic (congestive) heart failure: Secondary | ICD-10-CM

## 2014-12-14 DIAGNOSIS — E038 Other specified hypothyroidism: Secondary | ICD-10-CM | POA: Diagnosis not present

## 2014-12-14 DIAGNOSIS — G40909 Epilepsy, unspecified, not intractable, without status epilepticus: Secondary | ICD-10-CM

## 2014-12-14 NOTE — Progress Notes (Signed)
Patient ID: Erica [name], female   DOB: Apr 07, 1917, 81 y.o.   MRN: [id number]   ACILITY: [facility]    LEVEL OF CARE:   SNF  This is a routine visit.     CHIEF COMPLAINT: Medical management of chronic medical conditions including CHF-atrial fibrillation-hypothyroidism.--Acute visit secondary to right foot lesion      HISTORY OF PRESENT ILLNESS:  This is a 81 year-old whom we initially admitted to the building in early September.  She had been brought to the hospital with progressive shortness of breath, dyspnea, and orthopnea.   She was found to be in atrial fibrillation with rapid ventricular response.  Echocardiogram showed systolic function of 99-37% with diffuse hypokinesis, grade 2 diastolic dysfunction, severe left atrial enlargement, moderate TR, and moderate to severe pulmonary hypertension.  She was put on Toprol XL and Eliquis  Patient went home for short while-however apparently there was not a support network in place to address all of her needs-and she returned to the facility apparently 10 days later-Dr. Dellia Nims was concerned about some weight gain and edema and did increase her Lasix --since then she has seen cardiology and they switched her to torsemide 40 mg daily she is also on potassium supplementation quite aggressive at 40 mEq twice a day.  She has been stable in this regard her weights have been stable around 150 pounds for some time now.  Recent blood pressure 102/60 this appears to be relatively baseline and per cardiology note they have been have not been real aggressive with an ACE inhibitor because of this  .  Currently her atrial fibrillation appears to be rate controlled on Lopressor 75 mg  a day       She does not complain of any shortness of breath even with exertion-  She has noted a lesion on her right foot that apparently popped up last day or 2-she says it is tender and bothers here somewhat  She does have a history of  hypothyroidism-she is on Synthroid this was increased  to 200 g a day TSH in early November was 10.96-did come down to 6.669 on December 7--and normalized at 3.989 on December 31  She also has a history seizure disorder this is been stable during her stay here most recent Dilantin level in January was 17.1 we will update this she is on Dilantin 3 times a day--50 mg in the morning and 100 mg 2 later in the day              PAST MEDICAL HISTORY/PROBLEM LIST:      Hypertension with LVH and grade 2 diastolic dysfunction.    Hemorrhoids.    Thyroid disease.    Osteoarthritis.    History of a seizure disorder.     History of depression.    Hyperlipidemia.    History of skin cancer.    PAST SURGICAL HISTORY:    Cataract extraction bilaterally.    Carpal tunnel release in the left hand.    Shoulder arthroplasty on the right.    Total hip arthroplasty on the right.    Total knee arthroplasty on the right.    Total knee arthroplasty on the left.    Cholecystectomy.    Cyst excision.    Abdominal hysterectomy.    Tonsillectomy.    Skin cancer excision.    Open reduction and internal fixation of the distal radius in 2014.    CURRENT MEDICATIONS:  Medication list is reviewed .  Tylenol plain 650 q.6 p.r.n.    Eliquis 2.5 b.i.d.   Torsemide 40 mg every morning.    Synthroid2 00 mcg daily. .    Metoprolol extended-release 75 mg once a day.    Paxil 20 mg once a day.    Vitamin D3, 2000 U once a day.    Dilantin 40 mg every morning-100 mg twice a day later in the day.   KCl 40 mEq a day. BID   SOCIAL HISTORY:   HOUSING:  As mentioned, the patient was living in her own home before the hospitalization when she first came here, and we discharged her home.  Apparently, the social support network that was there previously was just not there and this may have more to do with her readmission here than anything else.   CODE STATUS:  The patient is a DNR.   Marland Kitchen      FAMILY HISTORY:  None related by the patient.     REVIEW OF SYSTEMS In general no complaints of fever or chills her weight appears to be relatively stable will have to be monitored however.  Skin does not complain of rashes or itching has numerous solar induced changes which are chroni--is also noted a lesion on her right foot last couple days as noted above c.  Head ears eyes nose mouth and throat --does not complain of sore throat or visual changes.  Respiratory--does not complain of cough or shortness of breath  .  GI does not complain of any abdominal discomfort nausea vomiting diarrhea or constipation.  Musculoskeletal is not complaining of any joint pain  GU does not complain of dysuria.  Neurologic does not complain of dizziness headache or syncopal-type episodes.  Psych-apparently at times does have some anxiety-appears to be fully cognizant:        PHYSICAL EXAMINATION:    Temperature 97.7 pulse 79 respirations 20 blood pressure 102/60 weight appears to be stable at 150 .   GENERAL APPEARANCE:  The patient is not in any distress.  Comfortably in a wheelchair.  Her skin is warm and dry does have numerous solar induced changes face upper extremities especially--appears to have a seborrheic keratosis left temple area.  She also has a raised nodular area mid lateral right foot-this appears possibly to have some rolled borders suspicious for possible basal cell carcinoma-there is some tenderness to palpation of the area question mild erythema. Surrounding it  Eyes pupils appear reactive to light sclera and conjunctiva clear.  Oropharynx clear mucous membranes moist.  Chest is clear to auscultation there is no labored breathing somewhat shallow air entry   Heart is regular rate and rhythm--with occasional irregular beat-- without murmur gallop or rub she appears to have 1+ lower extremity edema this appears to be relatively baseline--with baseline venous stasis  changes- Abdomen is soft nontender with positive bowel sounds is somewhat protuberant  Musculoskeletal-I did not note any deformities other than arthritic changes she ambulates in a wheelchair largely strength appears to be intact all 4 extremities with some lower extremity weakness I suspect his baseline.  Neurologic is grossly intact no lateralizing findings cranial nerves intact speech is clear.  Psych she is alert and oriented pleasant and appropriate.  Labs.  11/28/2014.  Dilantin level XI.9.  11/22/2014.  Sodium 141 potassium 3.8 BUN 29 creatinine 0.75  11/11/2014.  Magnesium-1.9.  10/28/2014.  GGT-165.  10/16/2014.  Liver function tests within normal limits except alkaline phosphatase 189 AST 40 ALT 39.  10/10/2014.  WBC 5.8  hemoglobin 13.0 platelets 180    11/18/1998  09/16/2014.  TSH 6.669.  08/30/2014 Dilantin level XVI.9.    08/15/2014.  IOE-70.350.  WBC 5.9 hemoglobin 14.8.  Sodium 139 potassium 3.5 BUN 16 creatinine 0.6.  Alkaline phosphatase 152 and albumin 3.3 otherwise liver function tests within normal limits.  Dilantin level III.1.   Marland Kitchen       Marland Kitchen    r.    ASSESSMENT/PLAN:  Possibly biventricular heart failure.  She has moderate to severe pulmonary hypertension.  .  Clinically she appears stable  She is followed by cardiology she is currently on torsemide with aggressive potassium supplementation-her weights have been stable-will need to update metabolic panel      Previous echocardiogram done in August of this year showed an EF of 09-38%, grade 2 diastolic dysfunction, moderate LVH.  Pulmonary artery systolic pressure was 59.      Atrial fibrillation.  This appears rate controlled on current dose of metoprolol-she is on Eliquis for anticoagulation     Cough. --This appears to be improved since lisinopril was discontinued      Severe bilateral venous stasis.  This appears to be at baseline she has TED hose    History  of depression. --This appears to be stable she is on Paxil-initially had some subdued episodes on initial coming back to facility-but she appears to have adapted well--she understands the need for increased care at this point       Seizure disorder.   Her Dilantin level initially was low on readmission-then it became borderline high-Dilantin dose was slightly reduced I see most recent Dilantin level was 11.9 we will need to update this and  if continues to drop suspect we will have to increase her dose    Hypothyroidism-she continues on Synthroid again this was recently increased-TSH normalized but will have to update this    Some history of mildly elevated liver function tests-will update this as well.    Dermatitis unspecified-I do have concern with the lesion on her lower right foot-at this point would be suspicious of basal cell carcinoma possibly a  cellulitis associated with this the area is tender and somewhat erythematous-will start doxycycline 100 mg twice a day for 7 days and order a dermatology consult continue to monitor for changes    Also will update a CBC.    [id number] note greater than 40 minutes spent assessing patient-reviewing her chart-and coordinating and formulating a plan of care for numerous diagnoses-of note greater than 50% of time spent coordinating plan of care.     ' .

## 2014-12-15 ENCOUNTER — Other Ambulatory Visit (HOSPITAL_COMMUNITY)
Admission: RE | Admit: 2014-12-15 | Discharge: 2014-12-15 | Disposition: A | Discharge status: Home / Self Care | Payer: Medicare Other | Source: Other Acute Inpatient Hospital | Attending: Internal Medicine | Admitting: Internal Medicine

## 2014-12-15 DIAGNOSIS — E785 Hyperlipidemia, unspecified: Secondary | ICD-10-CM | POA: Diagnosis not present

## 2014-12-15 DIAGNOSIS — E039 Hypothyroidism, unspecified: Secondary | ICD-10-CM | POA: Diagnosis not present

## 2014-12-15 DIAGNOSIS — I10 Essential (primary) hypertension: Secondary | ICD-10-CM | POA: Diagnosis present

## 2014-12-15 LAB — CBC WITH DIFFERENTIAL/PLATELET
Basophils Absolute: 0 10*3/uL (ref 0.0–0.1)
Basophils Relative: 1 % (ref 0–1)
EOS PCT: 3 % (ref 0–5)
Eosinophils Absolute: 0.2 10*3/uL (ref 0.0–0.7)
HEMATOCRIT: 42.7 % (ref 36.0–46.0)
Hemoglobin: 13.5 g/dL (ref 12.0–15.0)
LYMPHS PCT: 32 % (ref 12–46)
Lymphs Abs: 1.9 10*3/uL (ref 0.7–4.0)
MCH: 31.3 pg (ref 26.0–34.0)
MCHC: 31.6 g/dL (ref 30.0–36.0)
MCV: 98.8 fL (ref 78.0–100.0)
MONO ABS: 0.6 10*3/uL (ref 0.1–1.0)
Monocytes Relative: 11 % (ref 3–12)
NEUTROS ABS: 3.2 10*3/uL (ref 1.7–7.7)
NEUTROS PCT: 53 % (ref 43–77)
PLATELETS: 186 10*3/uL (ref 150–400)
RBC: 4.32 MIL/uL (ref 3.87–5.11)
RDW: 14.4 % (ref 11.5–15.5)
WBC: 5.9 10*3/uL (ref 4.0–10.5)

## 2014-12-15 LAB — COMPREHENSIVE METABOLIC PANEL
ALK PHOS: 165 U/L — AB (ref 39–117)
ALT: 32 U/L (ref 0–35)
AST: 35 U/L (ref 0–37)
Albumin: 3.5 g/dL (ref 3.5–5.2)
Anion gap: 7 (ref 5–15)
BUN: 29 mg/dL — ABNORMAL HIGH (ref 6–23)
CHLORIDE: 101 mmol/L (ref 96–112)
CO2: 32 mmol/L (ref 19–32)
CREATININE: 0.85 mg/dL (ref 0.50–1.10)
Calcium: 8.8 mg/dL (ref 8.4–10.5)
GFR, EST AFRICAN AMERICAN: 65 mL/min — AB (ref >90)
GFR, EST NON AFRICAN AMERICAN: 56 mL/min — AB (ref >90)
Glucose, Bld: 106 mg/dL — ABNORMAL HIGH (ref 70–99)
Potassium: 4 mmol/L (ref 3.5–5.1)
Sodium: 140 mmol/L (ref 135–145)
Total Bilirubin: 0.2 mg/dL — ABNORMAL LOW (ref 0.3–1.2)
Total Protein: 6.6 g/dL (ref 6.0–8.3)

## 2014-12-15 LAB — PHENYTOIN LEVEL, TOTAL: PHENYTOIN LVL: 10.2 ug/mL (ref 10.0–20.0)

## 2014-12-15 LAB — TSH: TSH: 3.438 u[IU]/mL (ref 0.350–4.500)

## 2015-01-03 ENCOUNTER — Non-Acute Institutional Stay (SKILLED_NURSING_FACILITY): Payer: Medicare Other | Admitting: Internal Medicine

## 2015-01-03 DIAGNOSIS — I5042 Chronic combined systolic (congestive) and diastolic (congestive) heart failure: Secondary | ICD-10-CM | POA: Diagnosis not present

## 2015-01-03 DIAGNOSIS — R609 Edema, unspecified: Secondary | ICD-10-CM | POA: Diagnosis not present

## 2015-01-03 NOTE — Progress Notes (Signed)
Patient ID: Erica [name], female   DOB: 1916/10/22, 81 y.o.   MRN: [id number]   ACILITY: [facility]    LEVEL OF CARE:   SNF  This is an acute  visit.     CHIEF COMPLAINT: Acute visit secondary to weight gain-      HISTORY OF PRESENT ILLNESS:  This is a 81 year-old whom we initially admitted to the building in early September.  She had been brought to the hospital with progressive shortness of breath, dyspnea, and orthopnea.   She was found to be in atrial fibrillation with rapid ventricular response.  Echocardiogram showed systolic function of 78-24% with diffuse hypokinesis, grade 2 diastolic dysfunction, severe left atrial enlargement, moderate TR, and moderate to severe pulmonary hypertension.  She was put on Toprol XL and Eliquis  Patient went home for short while-however apparently there was not a support network in place to address all of her needs-and she returned to the facility apparently 10 days later-Dr. Dellia Nims was concerned about some weight gain and edema and did increase her Lasix --since then she has seen cardiology and they switched her to torsemide 40 mg daily she is also on potassium supplementation quite aggressive at 40 mEq twice a day.  She has been stable in this regard her weights had been stable around 150 pounds for some time --however it appears most recent weight is 154.4 which appears to be up about 3 pounds over the past week to 10 days.  She does not complain of any shortness of breath or chest pain-.  She does have some history of her blood pressures running a bit low and per cardiology note they have been have not been real aggressive with titrating her meds or adding an ACE inhibitor because of this  .  Currently her atrial fibrillation appears to be rate controlled on Lopressor 75 mg  a day  Blood pressure taken today was 116/60-previous 1 118/54       She does not complain of any shortness of breath even with exertion-  S When  I last saw her she had a lesion that had popped up on her right ankle area-she has seen dermatology since then they have excised the area and apparently this is being biopsed--                PAST MEDICAL HISTORY/PROBLEM LIST:      Hypertension with LVH and grade 2 diastolic dysfunction.    Hemorrhoids.    Thyroid disease.    Osteoarthritis.    History of a seizure disorder.     History of depression.    Hyperlipidemia.    History of skin cancer.    PAST SURGICAL HISTORY:    Cataract extraction bilaterally.    Carpal tunnel release in the left hand.    Shoulder arthroplasty on the right.    Total hip arthroplasty on the right.    Total knee arthroplasty on the right.    Total knee arthroplasty on the left.    Cholecystectomy.    Cyst excision.    Abdominal hysterectomy.    Tonsillectomy.    Skin cancer excision.    Open reduction and internal fixation of the distal radius in 2014.    CURRENT MEDICATIONS:  Medication list is reviewed .    Tylenol plain 650 q.6 p.r.n.    Eliquis 2.5 b.i.d.   Torsemide 40 mg every morning.    Synthroid2 00 mcg daily. .    Metoprolol  extended-release 75 mg once a day.    Paxil 20 mg once a day.    Vitamin D3, 2000 U once a day.    Dilantin 40 mg every morning-100 mg twice a day later in the day.   KCl 40 mEq a day. BID   SOCIAL HISTORY:   HOUSING:  As mentioned, the patient was living in her own home before the hospitalization when she first came here, and we discharged her home.  Apparently, the social support network that was there previously was just not there and this may have more to do with her readmission here than anything else.   CODE STATUS:  The patient is a DNR.   Marland Kitchen    FAMILY HISTORY:  None related by the patient.     REVIEW OF SYSTEMS In general no complaints of fever or chills --appears she has gained a modest amount of weight  Skin does not complain of rashes or itching has numerous solar  induced changes which are chroni--is also noted a lesion on her right foot has been removed there is now some dried crusting at the site  Head ears eyes nose mouth and throat --does not complain of sore throat or visual changes.  Respiratory--does not complain of cough or shortness of breath  .  GI does not complain of any abdominal discomfort nausea vomiting diarrhea or constipation.  Musculoskeletal is not complaining of any joint pain  GU does not complain of dysuria.  Neurologic does not complain of dizziness headache or syncopal-type episodes.  Psych-apparently at times does have some anxiety-appears to be fully cognizant:        PHYSICAL EXAMINATION:   She is afebrile pulse 60 respirations 18 blood pressure 116/60 weight is 154.4 .   GENERAL APPEARANCE:  The patient is not in any distress.  Comfortably in a wheelchair.  Her skin is warm and dry does have numerous solar induced changes face upper extremities especially--appears to have a seborrheic keratosis left temple area.  Previous raised nodular area on her right foot has been removed by dermatology as noted above there is crusting at the surgical site    Eyes pupils appear reactive to light sclera and conjunctiva clear.  Oropharynx clear mucous membranes moist.  Chest is clear to auscultation there is no labored breathing somewhat shallow air entry   Heart is regular  irregular rate and rhythm--- without murmur gallop or rub she appears to have 1+-2plus lower extremity edema this appears to be mildly increased from previous exam-has venous stasis changes- Abdomen is soft nontender with positive bowel sounds is somewhat protuberant  Musculoskeletal-I did not note any deformities other than arthritic changes she ambulates in a wheelchair largely strength appears to be intact all 4 extremities with some lower extremity weakness I suspect his baseline.  Neurologic is grossly intact no lateralizing findings cranial  nerves intact speech is clear.  Psych she is alert and oriented pleasant and appropriate.  Labs.  12/15/2014.  Sodium 140 potassium 4 BUN 29 creatinine 0.5.  Asthma phosphatase 165 liver function tests within normal limits.  WBC 5.9 hemoglobin 13.5 platelets 186.  Dilantin 10.2.  TSH-3.438.    11/28/2014.  Dilantin level XI.9.  11/22/2014.  Sodium 141 potassium 3.8 BUN 29 creatinine 0.75  11/11/2014.  Magnesium-1.9.  10/28/2014.  GGT-165.  10/16/2014.  Liver function tests within normal limits except alkaline phosphatase 189 AST 40 ALT 39.  10/10/2014.  WBC 5.8 hemoglobin 13.0 platelets 180    11/18/1998  09/16/2014.  TSH 6.669.  08/30/2014 Dilantin level XVI.9.    08/15/2014.  FGH-82.993.  WBC 5.9 hemoglobin 14.8.  Sodium 139 potassium 3.5 BUN 16 creatinine 0.6.  Alkaline phosphatase 152 and albumin 3.3 otherwise liver function tests within normal limits.  Dilantin level III.1.   Marland Kitchen       Marland Kitchen    r.    ASSESSMENT/PLAN:  Possibly biventricular heart failure.  She has moderate to severe pulmonary hypertension.  .  Clinically she appears stable--but have noted some weight gain - mildly increased edema  She is followed by cardiology she is currently on torsemide with aggressive potassium supplementation-  Will increase torsemide slightly to 50 mg every morning for the next 3 days--we will have to monitor her blood pressure carefully -this is somewhatdelicatef a balancing act-I feel she would benefit from the increased diuretic this will have to be monitored if tolerated would have low threshold for increasing her Demadex further  Will update metabolic panel on Monday, March 25-monitor weights closely daily notify provider of any weight gain      Previous echocardiogram done in August of this year showed an EF of 71-69%, grade 2 diastolic dysfunction, moderate LVH.  Pulmonary artery systolic pressure was 59.      Atrial fibrillation.   This appears rate controlled on current dose of metoprolol-she is on Eliquis for anticoagulation      [id number]    .

## 2015-01-04 ENCOUNTER — Encounter: Payer: Self-pay | Admitting: Internal Medicine

## 2015-01-04 DIAGNOSIS — I509 Heart failure, unspecified: Secondary | ICD-10-CM | POA: Insufficient documentation

## 2015-01-06 ENCOUNTER — Encounter (HOSPITAL_COMMUNITY)
Admission: RE | Admit: 2015-01-06 | Discharge: 2015-01-06 | Disposition: A | Discharge status: Home / Self Care | Payer: Medicare Other | Source: Skilled Nursing Facility | Attending: Internal Medicine | Admitting: Internal Medicine

## 2015-01-06 DIAGNOSIS — R635 Abnormal weight gain: Secondary | ICD-10-CM | POA: Insufficient documentation

## 2015-01-06 LAB — BASIC METABOLIC PANEL
Anion gap: 9 (ref 5–15)
BUN: 25 mg/dL — ABNORMAL HIGH (ref 6–23)
CALCIUM: 8.4 mg/dL (ref 8.4–10.5)
CO2: 32 mmol/L (ref 19–32)
CREATININE: 0.75 mg/dL (ref 0.50–1.10)
Chloride: 98 mmol/L (ref 96–112)
GFR, EST AFRICAN AMERICAN: 81 mL/min — AB (ref >90)
GFR, EST NON AFRICAN AMERICAN: 70 mL/min — AB (ref >90)
Glucose, Bld: 102 mg/dL — ABNORMAL HIGH (ref 70–99)
Potassium: 3.5 mmol/L (ref 3.5–5.1)
Sodium: 139 mmol/L (ref 135–145)

## 2015-01-09 ENCOUNTER — Other Ambulatory Visit (HOSPITAL_COMMUNITY)
Admission: AD | Admit: 2015-01-09 | Discharge: 2015-01-09 | Disposition: A | Discharge status: Home / Self Care | Payer: Medicare Other | Source: Skilled Nursing Facility | Attending: Internal Medicine | Admitting: Internal Medicine

## 2015-01-09 DIAGNOSIS — R69 Illness, unspecified: Secondary | ICD-10-CM | POA: Insufficient documentation

## 2015-01-09 LAB — BASIC METABOLIC PANEL
ANION GAP: 7 (ref 5–15)
BUN: 28 mg/dL — ABNORMAL HIGH (ref 6–23)
CALCIUM: 8.8 mg/dL (ref 8.4–10.5)
CHLORIDE: 100 mmol/L (ref 96–112)
CO2: 34 mmol/L — ABNORMAL HIGH (ref 19–32)
CREATININE: 0.84 mg/dL (ref 0.50–1.10)
GFR calc Af Amer: 66 mL/min — ABNORMAL LOW (ref >90)
GFR calc non Af Amer: 57 mL/min — ABNORMAL LOW (ref >90)
GLUCOSE: 97 mg/dL (ref 70–99)
Potassium: 3.7 mmol/L (ref 3.5–5.1)
Sodium: 141 mmol/L (ref 135–145)

## 2015-01-14 ENCOUNTER — Encounter: Payer: Self-pay | Admitting: Internal Medicine

## 2015-01-14 ENCOUNTER — Non-Acute Institutional Stay (SKILLED_NURSING_FACILITY): Payer: Medicare Other | Admitting: Internal Medicine

## 2015-01-14 DIAGNOSIS — B029 Zoster without complications: Secondary | ICD-10-CM

## 2015-01-14 DIAGNOSIS — E038 Other specified hypothyroidism: Secondary | ICD-10-CM | POA: Diagnosis not present

## 2015-01-14 DIAGNOSIS — I4891 Unspecified atrial fibrillation: Secondary | ICD-10-CM

## 2015-01-14 DIAGNOSIS — G40909 Epilepsy, unspecified, not intractable, without status epilepticus: Secondary | ICD-10-CM

## 2015-01-14 DIAGNOSIS — I509 Heart failure, unspecified: Secondary | ICD-10-CM

## 2015-01-14 HISTORY — DX: Zoster without complications: B02.9

## 2015-01-14 NOTE — Progress Notes (Signed)
Patient ID: Erica [name], female   DOB: 09/23/1917, 81 y.o.   MRN: [id number]        ACILITY: [facility]:   SNF  This is a routine--acute visit.     CHIEF COMPLAINT: Medical management of chronic medical conditions including CHF-atrial fibrillation-hypothyroidism.--Acute visit secondary to rash     HISTORY OF PRESENT ILLNESS:  This is a 81 year-old whom we initially admitted to the building in early September.  She had been brought to the hospital with progressive shortness of breath, dyspnea, and orthopnea.   She was found to be in atrial fibrillation with rapid ventricular response.  Echocardiogram showed systolic function of 40-10% with diffuse hypokinesis, grade 2 diastolic dysfunction, severe left atrial enlargement, moderate TR, and moderate to severe pulmonary hypertension.  She was put on Toprol XL and Eliquis  Patient went home for short while-however apparently there was not a support network in place to address all of her needs-and she returned to the facility apparently 10 days later-Dr. Dellia Nims was concerned about some weight gain and edema and did increase her Lasix --since then she has seen cardiology and they switched her to torsemide 40 mg daily--- recently her weight went up about 5 pounds up to 155-we increased her Demadex to 60 mg a day and her weight appears to have moderated and is 153 today-the edema appears to be somewhat improved as well.    She does not complain of any shortness of breath even with exertion-   Most acute issue today is a rash that apparently started yesterday it is fairly extensive extending from her left buttocks area down her left thigh-she does not complain of any itching or pain-she is not running any fever vital signs appear to be stable   In regards to her other issues She does have a history of hypothyroidism-she is on Synthroid this was increased  to 200 g a day TSH in early November was 10.96-did  come down to 6.669 on December 7--and normalized at 3.989 on December 31 it remain normal most recent lab in March 2016 3.438--  She also has a history seizure disorder this is been stable during her stay here most recent Dilantin level in in February was 11.9 she is on Dilantin 3 times a day--50 mg in the morning and 100 mg 2 later in the day              PAST MEDICAL HISTORY/PROBLEM LIST:      Hypertension with LVH and grade 2 diastolic dysfunction.    Hemorrhoids.    Thyroid disease.    Osteoarthritis.    History of a seizure disorder.     History of depression.    Hyperlipidemia.    History of skin cancer.    PAST SURGICAL HISTORY:    Cataract extraction bilaterally.    Carpal tunnel release in the left hand.    Shoulder arthroplasty on the right.    Total hip arthroplasty on the right.    Total knee arthroplasty on the right.    Total knee arthroplasty on the left.    Cholecystectomy.    Cyst excision.    Abdominal hysterectomy.    Tonsillectomy.    Skin cancer excision.    Open reduction and internal fixation of the distal radius in 2014.    CURRENT MEDICATIONS:  Medication list is reviewed .    Tylenol plain 650 q.6 p.r.n.    Eliquis 2.5 b.i.d.  Torsemide 60 mg every morning.    Synthroid2 00 mcg daily. .    Metoprolol extended-release 75 mg once a day.    Paxil 20 mg once a day.    Vitamin D3, 2000 U once a day.    Dilantin 40 mg every morning-100 mg twice a day later in the day.   KCl 60 Meq  in the morning-40 Meq p.m.   SOCIAL HISTORY:   HOUSING:  As mentioned, the patient was living in her own home before the hospitalization when she first came here, and we discharged her home.  Apparently, the social support network that was there previously was just not there and this may have more to do with her readmission here than anything else.   CODE STATUS:  The patient is a DNR.   Marland Kitchen    FAMILY HISTORY:  None related by the patient.      REVIEW OF SYSTEMS In general no complaints of fever or chills her weight appears to have stabilized.  Skin  She did recently have a lesion removed from her foot this is being followed by dermatology.  Most acutely she does have a rash as noted above left buttocks down her left thigh it does not hurt or itch.  Head ears eyes nose mouth and throat --does not complain of sore throat or visual changes.  Respiratory--does not complain of cough or shortness of breath  .  GI does not complain of any abdominal discomfort nausea vomiting diarrhea or constipation.  Musculoskeletal is not complaining of any joint pain  GU does not complain of dysuria.  Neurologic does not complain of dizziness headache or syncopal-type episodes.  Psych-apparently at times does have some anxiety-appears to be fully cognizant:        PHYSICAL EXAMINATION:    Temperature 97.8 pulse 80 respirations 22 pressure 104/55 weight appears to have stabilized in the low 150s most recently 153- .   GENERAL APPEARANCE:  The patient is not in any distress.  Comfortable in a wheelchair.  Her skin is warm and dry does have numerous solar induced changes face upper extremities especially--.  She also had a raised nodular area mid lateral right foot seen previously has been removed by dermatology-there is some dried scarring visible at the site.  Most significantly she has an erythematous rash what appears to have numerous grouped vesicles this extends from her left buttock. area down to her left upper thigh this appears to be in a somewhat  linear pattern there is no drainage or bleeding t  Eyes pupils appear reactive to light sclera and conjunctiva clear.  Oropharynx clear mucous membranes moist.  Chest is clear to auscultation there is no labored breathing somewhat shallow air entry   Heart is regular rate and rhythm--with occasional irregular beat-- without murmur gallop or rub she appears to have 1+ lower  extremity edema this appears to be relatively baseline--with baseline venous stasis changes- Abdomen is soft nontender with positive bowel sounds is somewhat protuberant  Musculoskeletal-I did not note any deformities other than arthritic changes she ambulates in a wheelchair largely strength appears to be intact all 4 extremities with some lower extremity weakness I which is baseline.  Neurologic is grossly intact no lateralizing findings cranial nerves intact speech is clear.  Psych she is alert and oriented pleasant and appropriate.  Labs.  01/06/2015.  Sodium 139 potassium 3.5 BUN 25 creatinine 0.75.  12/15/2014.  WBC 5.9 hemoglobin 13.5 platelets 186.  Liver function tests within  normal limits except alkaline phosphatase 165  11/28/2014.  Dilantin level XI.9.  11/22/2014.  Sodium 141 potassium 3.8 BUN 29 creatinine 0.75  11/11/2014.  Magnesium-1.9.  10/28/2014.  GGT-165.  10/16/2014.  Liver function tests within normal limits except alkaline phosphatase 189 AST 40 ALT 39.  10/10/2014.  WBC 5.8 hemoglobin 13.0 platelets 180    11/18/1998  09/16/2014.  TSH 6.669.  08/30/2014 Dilantin level XVI.9.    08/15/2014.  HOZ-22.482.  WBC 5.9 hemoglobin 14.8.  Sodium 139 potassium 3.5 BUN 16 creatinine 0.6.  Alkaline phosphatase 152 and albumin 3.3 otherwise liver function tests within normal limits.  Dilantin level III.1.   Marland Kitchen       Marland Kitchen    r.    ASSESSMENT/PLAN:  Rash-suspect herpes Zoster*-will treat withAcyclovir 800 mg 5 times a day for 7 days-also contact precautions-clinically she appears stable is not really complaining of pain or discomfort which is somewhat surprising this will have to be monitored however--I do note she has a prednisone allergy  Possibly biventricular heart failure.  She has moderate to severe pulmonary hypertension.  .  Clinically she appears stable  She is followed by cardiology she is currently on torsemide with  aggressive potassium supplementation-torsemide was recently increased to 60 mg a day-her weight appears to have stabilized-clinically she appears stable update metabolic is panel is pending later this week      Previous echocardiogram done in August of this year showed an EF of 50-03%, grade 2 diastolic dysfunction, moderate LVH.  Pulmonary artery systolic pressure was 59.      Atrial fibrillation.  This appears rate controlled on current dose of metoprolol-she is on Eliquis for anticoagulation     Cough. --This appears to be improved since lisinopril was discontinued      Severe bilateral venous stasis.  This appears to be at baseline she has TED hose    History of depression. --This appears to be stable she is on Paxil-initially had some subdued episodes on initial coming back to facility-but she appears to have adapted well--she understands the need for increased care at this point       Seizure disorder.   Her Dilantin level initially was low on readmission-then it became borderline high-Dilantin dose was slightly reduced I see most recent Dilantin level was 11.9 we will need to update this    Hypothyroidism-she continues on Synthroid again this was recently increased-TSH normalized but will have to update this since she has had variability    Some history of mildly elevated liver function tests-alkaline phosphatase remains mildly elevated-secondary to patient's advanced age and comorbidities have not been aggressive in pursuing further workup-clinically she is stable  Lesion on her right foot which has been removed by dermatology-this is followed by dermatology-this appears to be stable        .    [id number] note greater than 40 minutes spent assessing patient-reviewing her chart-and coordinating and formulating a plan of care for numerous diagnoses-of note greater than 50% of time spent coordinating plan of care.     ' .

## 2015-01-15 ENCOUNTER — Other Ambulatory Visit (HOSPITAL_COMMUNITY)
Admission: AD | Admit: 2015-01-15 | Discharge: 2015-01-15 | Disposition: A | Discharge status: Home / Self Care | Payer: Medicare Other | Source: Skilled Nursing Facility | Attending: Internal Medicine | Admitting: Internal Medicine

## 2015-01-15 DIAGNOSIS — R69 Illness, unspecified: Secondary | ICD-10-CM | POA: Insufficient documentation

## 2015-01-15 DIAGNOSIS — I10 Essential (primary) hypertension: Secondary | ICD-10-CM | POA: Insufficient documentation

## 2015-01-15 DIAGNOSIS — E785 Hyperlipidemia, unspecified: Secondary | ICD-10-CM | POA: Diagnosis not present

## 2015-01-15 DIAGNOSIS — E039 Hypothyroidism, unspecified: Secondary | ICD-10-CM | POA: Insufficient documentation

## 2015-01-15 LAB — CBC WITH DIFFERENTIAL/PLATELET
Basophils Absolute: 0 10*3/uL (ref 0.0–0.1)
Basophils Relative: 0 % (ref 0–1)
Eosinophils Absolute: 0.2 10*3/uL (ref 0.0–0.7)
Eosinophils Relative: 4 % (ref 0–5)
HCT: 42.6 % (ref 36.0–46.0)
HEMOGLOBIN: 14 g/dL (ref 12.0–15.0)
LYMPHS ABS: 0.8 10*3/uL (ref 0.7–4.0)
Lymphocytes Relative: 16 % (ref 12–46)
MCH: 32.1 pg (ref 26.0–34.0)
MCHC: 32.9 g/dL (ref 30.0–36.0)
MCV: 97.7 fL (ref 78.0–100.0)
MONOS PCT: 10 % (ref 3–12)
Monocytes Absolute: 0.5 10*3/uL (ref 0.1–1.0)
NEUTROS ABS: 3.3 10*3/uL (ref 1.7–7.7)
Neutrophils Relative %: 70 % (ref 43–77)
Platelets: 173 10*3/uL (ref 150–400)
RBC: 4.36 MIL/uL (ref 3.87–5.11)
RDW: 14.3 % (ref 11.5–15.5)
WBC: 4.7 10*3/uL (ref 4.0–10.5)

## 2015-01-15 LAB — COMPREHENSIVE METABOLIC PANEL
ALK PHOS: 165 U/L — AB (ref 39–117)
ALT: 23 U/L (ref 0–35)
ANION GAP: 9 (ref 5–15)
AST: 33 U/L (ref 0–37)
Albumin: 3.2 g/dL — ABNORMAL LOW (ref 3.5–5.2)
BILIRUBIN TOTAL: 0.8 mg/dL (ref 0.3–1.2)
BUN: 27 mg/dL — ABNORMAL HIGH (ref 6–23)
CHLORIDE: 96 mmol/L (ref 96–112)
CO2: 33 mmol/L — ABNORMAL HIGH (ref 19–32)
CREATININE: 0.78 mg/dL (ref 0.50–1.10)
Calcium: 8.9 mg/dL (ref 8.4–10.5)
GFR calc Af Amer: 80 mL/min — ABNORMAL LOW (ref >90)
GFR calc non Af Amer: 69 mL/min — ABNORMAL LOW (ref >90)
Glucose, Bld: 131 mg/dL — ABNORMAL HIGH (ref 70–99)
POTASSIUM: 4 mmol/L (ref 3.5–5.1)
Sodium: 138 mmol/L (ref 135–145)
Total Protein: 6.5 g/dL (ref 6.0–8.3)

## 2015-01-15 LAB — TSH: TSH: 2.155 u[IU]/mL (ref 0.350–4.500)

## 2015-01-15 LAB — PHENYTOIN LEVEL, TOTAL: Phenytoin Lvl: 13.3 ug/mL (ref 10.0–20.0)

## 2015-01-24 ENCOUNTER — Encounter (HOSPITAL_COMMUNITY)
Admission: AD | Admit: 2015-01-24 | Discharge: 2015-01-24 | Disposition: A | Discharge status: Home / Self Care | Payer: Medicare Other | Source: Skilled Nursing Facility | Attending: Internal Medicine | Admitting: Internal Medicine

## 2015-01-24 DIAGNOSIS — R69 Illness, unspecified: Secondary | ICD-10-CM | POA: Insufficient documentation

## 2015-01-24 LAB — BASIC METABOLIC PANEL
Anion gap: 8 (ref 5–15)
BUN: 29 mg/dL — ABNORMAL HIGH (ref 6–23)
CHLORIDE: 102 mmol/L (ref 96–112)
CO2: 31 mmol/L (ref 19–32)
Calcium: 8.5 mg/dL (ref 8.4–10.5)
Creatinine, Ser: 0.77 mg/dL (ref 0.50–1.10)
GFR calc non Af Amer: 69 mL/min — ABNORMAL LOW (ref >90)
GFR, EST AFRICAN AMERICAN: 80 mL/min — AB (ref >90)
Glucose, Bld: 103 mg/dL — ABNORMAL HIGH (ref 70–99)
POTASSIUM: 3.8 mmol/L (ref 3.5–5.1)
SODIUM: 141 mmol/L (ref 135–145)

## 2015-04-16 ENCOUNTER — Encounter: Payer: Self-pay | Admitting: Internal Medicine

## 2015-04-16 ENCOUNTER — Non-Acute Institutional Stay (SKILLED_NURSING_FACILITY): Payer: Medicare Other | Admitting: Internal Medicine

## 2015-04-16 ENCOUNTER — Ambulatory Visit: Payer: Medicare Other | Attending: Audiology | Admitting: Audiology

## 2015-04-16 DIAGNOSIS — I10 Essential (primary) hypertension: Secondary | ICD-10-CM

## 2015-04-16 DIAGNOSIS — H748X1 Other specified disorders of right middle ear and mastoid: Secondary | ICD-10-CM | POA: Diagnosis present

## 2015-04-16 DIAGNOSIS — R94128 Abnormal results of other function studies of ear and other special senses: Secondary | ICD-10-CM | POA: Insufficient documentation

## 2015-04-16 DIAGNOSIS — G40909 Epilepsy, unspecified, not intractable, without status epilepticus: Secondary | ICD-10-CM | POA: Diagnosis not present

## 2015-04-16 DIAGNOSIS — Z01118 Encounter for examination of ears and hearing with other abnormal findings: Secondary | ICD-10-CM | POA: Diagnosis not present

## 2015-04-16 DIAGNOSIS — E876 Hypokalemia: Secondary | ICD-10-CM

## 2015-04-16 DIAGNOSIS — H6121 Impacted cerumen, right ear: Secondary | ICD-10-CM | POA: Diagnosis present

## 2015-04-16 DIAGNOSIS — H9192 Unspecified hearing loss, left ear: Secondary | ICD-10-CM | POA: Insufficient documentation

## 2015-04-16 DIAGNOSIS — H9191 Unspecified hearing loss, right ear: Secondary | ICD-10-CM | POA: Insufficient documentation

## 2015-04-16 DIAGNOSIS — H93293 Other abnormal auditory perceptions, bilateral: Secondary | ICD-10-CM | POA: Insufficient documentation

## 2015-04-16 DIAGNOSIS — E038 Other specified hypothyroidism: Secondary | ICD-10-CM

## 2015-04-16 DIAGNOSIS — I5022 Chronic systolic (congestive) heart failure: Secondary | ICD-10-CM | POA: Diagnosis not present

## 2015-04-16 DIAGNOSIS — I4891 Unspecified atrial fibrillation: Secondary | ICD-10-CM | POA: Diagnosis not present

## 2015-04-16 NOTE — Procedures (Signed)
[facility] [address] [place], [place] [address] [place] EVALUATION  Name: Erica [name] DOB:  1916/10/15 MRN:  [id number]                                 Diagnosis: Sudden hearing loss Date: 04/16/2015    Referent: Cyndee Brightly, MD  HISTORY: Erica [name], age 81 y.o. years, was seen for an audiological evaluation. Erica [name] accompanied her.  Erica [name] states that about" 2-3 weeks ago she woke up and couldn't hear".  She says that she "became very scared but that her hearing came back some later".  Erica [name] states that he "was very concerned about her hearing and notified the nurses at the facility".   He thinks that Erica [name]'s hearing is still poorer than it was before this event.   Erica [name] denies that this event caused vertigo or balance issues.  Erica [name] also reports a feeling of "pressure in the right ear" and thinks that she has excessive ear wax.  Erica [name] realizes that she has a hearing problem and that this is interferring with her ability to communicate with others.          EVALUATION: Pure tone air and tone conduction was completed using conventional audiometry with inserts. She raised her hand when she heard a tone.  The left ear is her better hearing ear with hearing thresholds of 45-55 dBHL from 250Hz  - 1000Hz ; 60 dBHL at 2000Hz ; 75 dBHL at 4000Hz  and no response at 6000-8000Hz .  The hearing loss appears sensorineural.  The right ear hearing thresholds are 80-90 dBHL from 250Hz  - 2000Hz  and no response from 4000Hz  - 8000Hz .  She has some difficulty ignoring the masking to test the right side bone conduction but although there may be a conductive component from the excessive ear wax, there must be a significant sensorineural component.  Further testing is needed. Speech detection is 80 dBHL in the right ear and 55 dBHL in the left ear using recorded  multitalker noise - she had no correct words near threshold. The reliability is good. Word recognition is 34 % at 95dBHL in the right and 44% at 85dBHL in the left using recorded NU-6 word lists in quiet.  Otoscopic inspection reveals clear ear canals with excessive wax, possibly impacted on the right side and a visible tympanic membrane without redness on the left side. Tympanometry showed in the right ear had a small volume consistent with cerumen impaction and no peak (possibly type b?)  and was also abnormal in the left ear with poor tympanic membrane compliance  (ype As/B).   CONCLUSION:      Erica [name] has a severe to profound hearing loss on the right side that most likely has a significant sensorineural component, although a mixed component cannot be rule out because of the excessive ear wax-but testing could not be completed because the masking was an obstacle today.  The left ear has a moderate low frequency sloping to a profound high frequency sensorineural hearing loss.  She has poor word recognition bilaterally even at very loud levels, equivalent to talking at a very loud voice at 1-2 feet.  Erica [name] may be an excellent candidate for amplification therefore a hearing aid evaluation is recommended.  RECOMMENDATIONS: 1.   Referral to an Ear, Nose and Throat physician for earwax removal and further evaluation because of  the "sudden hearing loss" a few weeks ago.  2.   A hearing aid evaluation.  3.  If Erica [name] experiences another episode of "sudden hearing loss" it is imperative that she consult with an ENT within 48 hours.  4.   Strategies that help improve hearing include: A) Face the speaker directly within 1-2 feet in a very loud voice. Optimal is having the speakers face well - lit.   B) Avoid having the speaker back-lit as this will minimize the ability to use cues from lip-reading, facial expression and gestures. C)  Word recognition is poorer in background noise.  For optimal word recognition, turn off the TV, radio or noisy fan when engaging in conversation. In a restaurant, try to sit away from noise sources and close to the primary speaker.  D)  Ask for topic clarification from time to time in order to remain in the conversation.  Most people don't mind repeating or clarifying a point when asked.  If needed, explain the difficulty hearing in background noise or hearing loss.   Filip Luten L. Heide Spark, Au.D., CCC-A Doctor of Audiology   04/16/2015  cc: Cyndee Brightly, MD

## 2015-04-16 NOTE — Progress Notes (Signed)
Patient ID: Erica [name], female   DOB: 1917-07-01, 81 y.o.   MRN: [id number]         ACILITY[facility]    LEVEL OF CARE:   SNF  This is a routine- visit.     CHIEF COMPLAINT: Medical management of chronic medical conditions including CHF-atrial fibrillation-hypothyroidism.-  HISTORY OF PRESENT ILLNESS:  This is a 81 year-old whom we initially admitted to the building in early September.  She had been brought to the hospital with progressive shortness of breath, dyspnea, and orthopnea.   She was found to be in atrial fibrillation with rapid ventricular response.  Echocardiogram showed systolic function of 56-31% with diffuse hypokinesis, grade 2 diastolic dysfunction, severe left atrial enlargement, moderate TR, and moderate to severe pulmonary hypertension.  She was put on Toprol XL and Eliquis  Patient went home for short while-however apparently there was not a support network in place to address all of her needs-and she returned to the facility apparently 10 days later-Dr. Dellia Nims was concerned about some weight gain and edema and did increase her Lasix --she was seen by cardiology and they switched her to torsemide 60 mg daily---  Her weight appears to have stabilized currently 153 this is been stable now for some time    She does not complain of any shortness of breath -   Several months ago she was seen for a rash that was thought to be herpes zoster related this appears to have resolved status post treatment with anti-viral     In regards to her other issues She does have a history of hypothyroidism-she is on Synthroid this was increased  to 200 g a day TSH in early November was 10.96-did come down to 6.669 on December 7--and normalized at 3.989 on December 31 it remain normal most recent lab in April it was 2.155--  She also has a history seizure disorder this is been stable during her stay here most Dilantin level in in April was 13.3 she is on  Dilantin 3 times a day--50 mg in the morning and 100 mg 2 later in the day We will update this  She was seen by an audiologist today for difficulty hearing apparently her ears were cleaned out-apparently a hearing aid was discussed but patient does not really want one it appears at this time per my discussion with her this evening              PAST MEDICAL HISTORY/PROBLEM LIST:      Hypertension with LVH and grade 2 diastolic dysfunction.    Hemorrhoids.    Thyroid disease.    Osteoarthritis.    History of a seizure disorder.     History of depression.    Hyperlipidemia.    History of skin cancer.    PAST SURGICAL HISTORY:    Cataract extraction bilaterally.    Carpal tunnel release in the left hand.    Shoulder arthroplasty on the right.    Total hip arthroplasty on the right.    Total knee arthroplasty on the right.    Total knee arthroplasty on the left.    Cholecystectomy.    Cyst excision.    Abdominal hysterectomy.    Tonsillectomy.    Skin cancer excision.    Open reduction and internal fixation of the distal radius in 2014.    CURRENT MEDICATIONS:  Medication list is reviewed .    Tylenol plain 650 q.6 p.r.n.    Eliquis 2.5 b.i.d.  Torsemide 60 mg every morning.    Synthroid2 00 mcg daily. .    Metoprolol extended-release 75 mg once a day.    Paxil 20 mg once a day.    Vitamin D3, 2000 U once a day.    Dilantin 50 mg every morning-100 mg twice a day later in the day.   KCl 60 Meq  in the morning-40 Meq p.m.   SOCIAL HISTORY:   HOUSING:  As mentioned, the patient was living in her own home before the hospitalization when she first came here, and we discharged her home.  Apparently, the social support network that was there previously was just not there and this may have more to do with her readmission here than anything else.   CODE STATUS:  The patient is a DNR.   Marland Kitchen    FAMILY HISTORY:  None related by the patient.     REVIEW  OF SYSTEMS In general no complaints of fever or chills her weight appears to have stabilized.  Skin  She did recently have a lesion removed from her foot this was followed by dermatology.     Head ears eyes nose mouth and throat --does not complain of sore throat or visual changes Has some difficulty hearing has seen an audiologist as noted above.  Respiratory--does not complain of cough or shortness of breath  .  GI does not complain of any abdominal discomfort nausea vomiting diarrhea or constipation.  Musculoskeletal is not complaining of any joint pain  GU does not complain of dysuria.  Neurologic does not complain of dizziness headache or syncopal-type episodes.  Psych-apparently at times does have some anxiety-appears to be fully cognizant:        PHYSICAL EXAMINATION:     Temperature 98.0 pulse 64 respirations 20 blood pressure 101/53 all these appear relatively baseline weight appears to be stable at 153 .   GENERAL APPEARANCE:  The patient is not in any distress.  Comfortable in a wheelchair.  Her skin is warm and dry does have numerous solar induced changes face upper extremities especially--. This appears relatively unchanged she is status post lesion removal right foot   t  Eyes pupils appear reactive to light sclera and conjunctiva clear.  Oropharynx clear mucous membranes moist.  Chest is clear to auscultation there is no labored breathing somewhat shallow air entry   Heart is regular rate and rhythm--with occasional irregular beat-- without murmur gallop or rub she appears to have 1+ lower extremity edema this appears to be relatively baseline--with baseline venous stasis changes- Abdomen is soft nontender with positive bowel sounds is somewhat protuberant  Musculoskeletal-I did not note any deformities other than arthritic changes she ambulates in a wheelchair largely strength appears to be intact all 4 extremities with some lower extremity weakness I  which is baseline.  Neurologic is grossly intact no lateralizing findings cranial nerves intact speech is clear.  Psych she is alert and oriented pleasant and appropriate.  Labs. 01/24/2015.  Sodium 141 potassium 3.8 BUN 29 creatinine 0.77  01/15/2015.  TSH-2.155.  Sodium 138 potassium 4 BUN 27 creatinine 0.78.  Liver function tests within normal limits except albumen of 3.2L, phosphatase 165 she has a history of some mildly elevated alkaline phosphatase.  Dilantin level XIII.3.  WBC 4.7 hemoglobin 14.0 platelets 173.        01/06/2015.  Sodium 139 potassium 3.5 BUN 25 creatinine 0.75.  12/15/2014.  WBC 5.9 hemoglobin 13.5 platelets 186.  Liver function tests within normal limits  except alkaline phosphatase 165  11/28/2014.  Dilantin level XI.9.  11/22/2014.  Sodium 141 potassium 3.8 BUN 29 creatinine 0.75  11/11/2014.  Magnesium-1.9.  10/28/2014.  GGT-165.  10/16/2014.  Liver function tests within normal limits except alkaline phosphatase 189 AST 40 ALT 39.  10/10/2014.  WBC 5.8 hemoglobin 13.0 platelets 180    11/18/1998  09/16/2014.  TSH 6.669.  08/30/2014 Dilantin level XVI.9.    08/15/2014.  ZDG-38.756.  WBC 5.9 hemoglobin 14.8.  Sodium 139 potassium 3.5 BUN 16 creatinine 0.6.  Alkaline phosphatase 152 and albumin 3.3 otherwise liver function tests within normal limits.  Dilantin level III.1.   Marland Kitchen       Marland Kitchen    r.    ASSESSMENT/PLAN:     Possibly biventricular heart failure.  She has moderate to severe pulmonary hypertension.  .  Clinically she appears stable  She is followed by cardiology she is currently on torsemide with aggressive potassium supplementation-torsemide was  increased to 60 mg a day-her weight appears to have stabilized-clinically she appears stable--will update metabolic panel      Previous echocardiogram done in August of this year showed an EF of 43-32%, grade 2 diastolic dysfunction, moderate  LVH.  Pulmonary artery systolic pressure was 59.      Atrial fibrillation.  This appears rate controlled on current dose of metoprolol-she is on Eliquis for anticoagulation     Cough. --This appears to be improved since lisinopril was discontinued      Severe bilateral venous stasis.  This appears to be at baseline she has TED hose    History of depression. --This appears to be stable she is on Paxil-initially had some subdued episodes on initial coming back to facility-but she appears to have adapted well--she understands the need for increased care at this point       Seizure disorder.   Her Dilantin level initially was low on readmission-then it became borderline high-Dilantin dose was slightly reduced I see most recent Dilantin level was 13.3  we will  update this    Hypothyroidism-she continues on Synthroid again this was recently increased-TSH normalized but will update this since she has had variability    Some history of mildly elevated liver function tests-alkaline phosphatase remains mildly elevated-secondary to patient's advanced age and comorbidities have not been aggressive in pursuing further workup-clinically she is stable     [id number]        ' .

## 2015-04-17 ENCOUNTER — Encounter (HOSPITAL_COMMUNITY)
Admission: AD | Admit: 2015-04-17 | Discharge: 2015-04-17 | Disposition: A | Discharge status: Home / Self Care | Payer: Medicare Other | Source: Skilled Nursing Facility | Attending: Internal Medicine | Admitting: Internal Medicine

## 2015-04-17 DIAGNOSIS — I10 Essential (primary) hypertension: Secondary | ICD-10-CM | POA: Insufficient documentation

## 2015-04-17 DIAGNOSIS — E039 Hypothyroidism, unspecified: Secondary | ICD-10-CM | POA: Diagnosis not present

## 2015-04-17 DIAGNOSIS — R569 Unspecified convulsions: Secondary | ICD-10-CM | POA: Diagnosis not present

## 2015-04-17 DIAGNOSIS — I5042 Chronic combined systolic (congestive) and diastolic (congestive) heart failure: Secondary | ICD-10-CM | POA: Diagnosis not present

## 2015-04-17 LAB — COMPREHENSIVE METABOLIC PANEL
ALK PHOS: 132 U/L — AB (ref 38–126)
ALT: 21 U/L (ref 14–54)
ANION GAP: 8 (ref 5–15)
AST: 27 U/L (ref 15–41)
Albumin: 3.3 g/dL — ABNORMAL LOW (ref 3.5–5.0)
BILIRUBIN TOTAL: 0.7 mg/dL (ref 0.3–1.2)
BUN: 26 mg/dL — ABNORMAL HIGH (ref 6–20)
CHLORIDE: 100 mmol/L — AB (ref 101–111)
CO2: 32 mmol/L (ref 22–32)
Calcium: 8.4 mg/dL — ABNORMAL LOW (ref 8.9–10.3)
Creatinine, Ser: 0.8 mg/dL (ref 0.44–1.00)
GFR calc Af Amer: >60 mL/min (ref >60)
GFR calc non Af Amer: >60 mL/min (ref >60)
Glucose, Bld: 104 mg/dL — ABNORMAL HIGH (ref 65–99)
Potassium: 3.2 mmol/L — ABNORMAL LOW (ref 3.5–5.1)
SODIUM: 140 mmol/L (ref 135–145)
Total Protein: 6 g/dL — ABNORMAL LOW (ref 6.5–8.1)

## 2015-04-17 LAB — CBC WITH DIFFERENTIAL/PLATELET
BASOS ABS: 0 10*3/uL (ref 0.0–0.1)
Basophils Relative: 0 % (ref 0–1)
EOS PCT: 6 % — AB (ref 0–5)
Eosinophils Absolute: 0.3 10*3/uL (ref 0.0–0.7)
HCT: 38.4 % (ref 36.0–46.0)
HEMOGLOBIN: 12.4 g/dL (ref 12.0–15.0)
LYMPHS ABS: 1.8 10*3/uL (ref 0.7–4.0)
LYMPHS PCT: 33 % (ref 12–46)
MCH: 31.8 pg (ref 26.0–34.0)
MCHC: 32.3 g/dL (ref 30.0–36.0)
MCV: 98.5 fL (ref 78.0–100.0)
MONOS PCT: 11 % (ref 3–12)
Monocytes Absolute: 0.6 10*3/uL (ref 0.1–1.0)
NEUTROS PCT: 50 % (ref 43–77)
Neutro Abs: 2.6 10*3/uL (ref 1.7–7.7)
PLATELETS: 156 10*3/uL (ref 150–400)
RBC: 3.9 MIL/uL (ref 3.87–5.11)
RDW: 13.2 % (ref 11.5–15.5)
WBC: 5.3 10*3/uL (ref 4.0–10.5)

## 2015-04-17 LAB — PHENYTOIN LEVEL, TOTAL: PHENYTOIN LVL: 13.1 ug/mL (ref 10.0–20.0)

## 2015-04-17 LAB — TSH: TSH: 2.562 u[IU]/mL (ref 0.350–4.500)

## 2015-04-21 DIAGNOSIS — I5042 Chronic combined systolic (congestive) and diastolic (congestive) heart failure: Secondary | ICD-10-CM | POA: Diagnosis not present

## 2015-04-21 LAB — BASIC METABOLIC PANEL
Anion gap: 8 (ref 5–15)
BUN: 26 mg/dL — AB (ref 6–20)
CO2: 33 mmol/L — ABNORMAL HIGH (ref 22–32)
Calcium: 8.7 mg/dL — ABNORMAL LOW (ref 8.9–10.3)
Chloride: 101 mmol/L (ref 101–111)
Creatinine, Ser: 0.77 mg/dL (ref 0.44–1.00)
Glucose, Bld: 100 mg/dL — ABNORMAL HIGH (ref 65–99)
POTASSIUM: 3.6 mmol/L (ref 3.5–5.1)
Sodium: 142 mmol/L (ref 135–145)

## 2015-04-21 LAB — MAGNESIUM: MAGNESIUM: 2 mg/dL (ref 1.7–2.4)

## 2015-05-27 ENCOUNTER — Ambulatory Visit (INDEPENDENT_AMBULATORY_CARE_PROVIDER_SITE_OTHER): Payer: Medicare Other | Admitting: Adult Health

## 2015-05-27 ENCOUNTER — Encounter: Payer: Self-pay | Admitting: Adult Health

## 2015-05-27 ENCOUNTER — Inpatient Hospital Stay (HOSPITAL_COMMUNITY)
Admit: 2015-05-27 | Discharge: 2015-05-27 | Disposition: A | Discharge status: Home / Self Care | Payer: Medicare Other | Attending: Adult Health | Admitting: Adult Health

## 2015-05-27 VITALS — BP 104/68 | HR 83 | Ht 66.0 in | Wt 154.4 lb

## 2015-05-27 DIAGNOSIS — R059 Cough, unspecified: Secondary | ICD-10-CM

## 2015-05-27 DIAGNOSIS — I10 Essential (primary) hypertension: Secondary | ICD-10-CM

## 2015-05-27 DIAGNOSIS — N289 Disorder of kidney and ureter, unspecified: Secondary | ICD-10-CM | POA: Diagnosis not present

## 2015-05-27 DIAGNOSIS — R05 Cough: Secondary | ICD-10-CM | POA: Diagnosis not present

## 2015-05-27 DIAGNOSIS — B999 Unspecified infectious disease: Secondary | ICD-10-CM

## 2015-05-27 MED ORDER — LEVOFLOXACIN 500 MG PO TABS: 500.0000 mg | ORAL_TABLET | Freq: Every day | ORAL | Status: DC

## 2015-05-27 MED ORDER — GUAIFENESIN 100 MG/5ML PO SOLN: 5.0000 mL | Freq: Four times a day (QID) | ORAL | Status: DC | PRN

## 2015-05-27 MED ORDER — GUAIFENESIN ER 1200 MG PO TB12: 1200.0000 mg | ORAL_TABLET | Freq: Two times a day (BID) | ORAL | Status: DC
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
## 2015-05-27 NOTE — Progress Notes (Deleted)
Name: Erica [name]    DOB: 02-17-1917  Age: 81 y.o.  MR#: [id number]       PCP:  Cyndee Brightly, MD      Insurance: Payor: MEDICARE / Plan: MEDICARE PART A AND B / Product Type: *No Product type* /   CC:    Chief Complaint  Patient presents with  . Atrial Fibrillation  . Hypertension  . Congestive Heart Failure    Systolic    VS Filed Vitals:   05/27/15 1404  BP: 104/68  Pulse: 83  Height: 5\' 6"  (1.676 m)  SpO2: 99%    Weights Current Weight  11/28/14 152 lb (68.947 kg)  10/29/14 152 lb (68.947 kg)  08/06/14 150 lb (68.04 kg)    Blood Pressure  BP Readings from Last 3 Encounters:  05/27/15 104/68  04/16/15 101/53  01/14/15 104/55     Admit date:  (Not on file) Last encounter with RMR:  Visit date not found   Allergy Clarithromycin; Ditropan; Prednisone; Vioxx; Biaxin; Erythromycin; Keflex; and Macrodantin  Current Outpatient Prescriptions  Medication Sig Dispense Refill  . torsemide (DEMADEX) 20 MG tablet Take 2 tablets (40 mg total) by mouth daily. (Patient taking differently: Take 60 mg by mouth daily. Take 60 mg total Demadex daily) 60 tablet 3   No current facility-administered medications for this visit.    Discontinued Meds:    Medications Discontinued During This Encounter  Medication Reason  . magnesium hydroxide (MILK OF MAGNESIA) 400 MG/5ML suspension Error    Patient Active Problem List   Diagnosis Date Noted  . Herpes zoster 01/14/2015  . Chronic CHF 01/04/2015  . Edema 06/14/2014  . Moderate tricuspid regurgitation 05/29/2014  . Moderate to severe pulmonary hypertension 05/29/2014  . Atrial fibrillation with RVR 05/28/2014  . Acute pulmonary edema 05/28/2014  . Acute systolic and diastolic CHF 44/96/7591  . Acute respiratory failure with hypoxia 05/28/2014  . Vertigo 02/05/2013  . C2 cervical fracture 02/05/2013  . UTI (urinary tract infection) 03/30/2012  . Generalized weakness 03/30/2012  . Hypokalemia 03/30/2012  .  Dehydration 03/30/2012  . Hypertension 03/30/2012  . Hypothyroidism 03/30/2012  . Rhabdomyolysis 03/30/2012  . Seizure disorder 03/30/2012  . Hyperlipidemia 03/30/2012  . Fall 03/30/2012  . Skin lesion of left leg 10/27/2011  . History of squamous cell carcinoma, right leg, left leg excised 12/21/2011 06/25/2011    LABS    Component Value Date/Time   NA 142 04/21/2015 0630   NA 140 04/17/2015 0712   NA 141 01/24/2015 0745   K 3.6 04/21/2015 0630   K 3.2* 04/17/2015 0712   K 3.8 01/24/2015 0745   CL 101 04/21/2015 0630   CL 100* 04/17/2015 0712   CL 102 01/24/2015 0745   CO2 33* 04/21/2015 0630   CO2 32 04/17/2015 0712   CO2 31 01/24/2015 0745   GLUCOSE 100* 04/21/2015 0630   GLUCOSE 104* 04/17/2015 0712   GLUCOSE 103* 01/24/2015 0745   BUN 26* 04/21/2015 0630   BUN 26* 04/17/2015 0712   BUN 29* 01/24/2015 0745   CREATININE 0.77 04/21/2015 0630   CREATININE 0.80 04/17/2015 0712   CREATININE 0.77 01/24/2015 0745   CALCIUM 8.7* 04/21/2015 0630   CALCIUM 8.4* 04/17/2015 0712   CALCIUM 8.5 01/24/2015 0745   GFRNONAA >60 04/21/2015 0630   GFRNONAA >60 04/17/2015 0712   GFRNONAA 69* 01/24/2015 0745   GFRAA >60 04/21/2015 0630   GFRAA >60 04/17/2015 0712   GFRAA 80* 01/24/2015 0745   CMP  Component Value Date/Time   NA 142 04/21/2015 0630   K 3.6 04/21/2015 0630   CL 101 04/21/2015 0630   CO2 33* 04/21/2015 0630   GLUCOSE 100* 04/21/2015 0630   BUN 26* 04/21/2015 0630   CREATININE 0.77 04/21/2015 0630   CALCIUM 8.7* 04/21/2015 0630   PROT 6.0* 04/17/2015 0712   ALBUMIN 3.3* 04/17/2015 0712   AST 27 04/17/2015 0712   ALT 21 04/17/2015 0712   ALKPHOS 132* 04/17/2015 0712   BILITOT 0.7 04/17/2015 0712   GFRNONAA >60 04/21/2015 0630   GFRAA >60 04/21/2015 0630       Component Value Date/Time   WBC 5.3 04/17/2015 0712   WBC 4.7 01/15/2015 0745   WBC 5.9 12/15/2014 0435   HGB 12.4 04/17/2015 0712   HGB 14.0 01/15/2015 0745   HGB 13.5 12/15/2014 0435   HCT  38.4 04/17/2015 0712   HCT 42.6 01/15/2015 0745   HCT 42.7 12/15/2014 0435   MCV 98.5 04/17/2015 0712   MCV 97.7 01/15/2015 0745   MCV 98.8 12/15/2014 0435    Lipid Panel  No results found for: CHOL, TRIG, HDL, CHOLHDL, VLDL, LDLCALC, LDLDIRECT  ABG    Component Value Date/Time   TCO2 29 08/06/2014 2027     Lab Results  Component Value Date   TSH 2.562 04/17/2015   BNP (last 3 results) No results for input(s): BNP in the last 8760 hours.  ProBNP (last 3 results)  Recent Labs  05/28/14 1000  PROBNP 1935.0*    Cardiac Panel (last 3 results) No results for input(s): CKTOTAL, CKMB, TROPONINI, RELINDX in the last 72 hours.  Iron/TIBC/Ferritin/ %Sat No results found for: IRON, TIBC, FERRITIN, IRONPCTSAT   EKG Orders placed or performed in visit on 05/27/15  . EKG 12-Lead     Prior Assessment and Plan Problem List as of 05/27/2015      Cardiovascular and Mediastinum   Hypertension   Last Assessment & Plan 06/07/2014 Office Visit Written 06/07/2014  3:50 PM by Lendon Colonel, NP    Good control of BP. NO changes in medical regimen.      Atrial fibrillation with RVR   Last Assessment & Plan 06/07/2014 Office Visit Written 06/07/2014  3:50 PM by Lendon Colonel, NP    HR is well controlled.She is tolerating Eliquis without melena or overt bleeding. She has some bruising which I have explained to her is normal. Will see her again in 3 months.      Acute systolic and diastolic CHF   Last Assessment & Plan 06/07/2014 Office Visit Written 06/07/2014  3:49 PM by Lendon Colonel, NP    No evidence of decompensation. Weight is stable. She is adhering to low sodium diet at [facility]. She is actually enjoying her stay there. She will continue current medical regimen without changes. No labs will be drawn today as labs were drawn one week ago and were WNL.  Will see her in 3 months.       Moderate tricuspid regurgitation   Moderate to severe pulmonary hypertension   Chronic  CHF     Respiratory   Acute pulmonary edema   Acute respiratory failure with hypoxia     Endocrine   Hypothyroidism     Nervous and Auditory   Seizure disorder     Musculoskeletal and Integument   Skin lesion of left leg   Rhabdomyolysis   C2 cervical fracture     Genitourinary   UTI (urinary tract infection)  Other   History of squamous cell carcinoma, right leg, left leg excised 12/21/2011   Generalized weakness   Hypokalemia   Dehydration   Hyperlipidemia   Fall   Vertigo   Edema   Herpes zoster       Imaging: No results found.

## 2015-05-27 NOTE — Progress Notes (Deleted)
Name: Erica [name]    DOB: [birth year]  Age: 81 y.o.  MR#: [id number]       PCP:  Cyndee Brightly, MD      Insurance: Payor: MEDICARE / Plan: MEDICARE PART A AND B / Product Type: *No Product type* /   CC:    Chief Complaint  Patient presents with  . Atrial Fibrillation  . Hypertension  . Congestive Heart Failure    Systolic    VS Filed Vitals:   05/27/15 1404  BP: 104/68  Pulse: 83  Height: 5\' 6"  (1.676 m)  SpO2: 99%    Weights Current Weight  11/28/14 152 lb (68.947 kg)  10/29/14 152 lb (68.947 kg)  08/06/14 150 lb (68.04 kg)    Blood Pressure  BP Readings from Last 3 Encounters:  05/27/15 104/68  04/16/15 101/53  01/14/15 104/55     Admit date:  (Not on file) Last encounter with RMR:  Visit date not found   Allergy Clarithromycin; Ditropan; Prednisone; Vioxx; Biaxin; Erythromycin; Keflex; and Macrodantin  Current Outpatient Prescriptions  Medication Sig Dispense Refill  . torsemide (DEMADEX) 20 MG tablet Take 2 tablets (40 mg total) by mouth daily. (Patient taking differently: Take 60 mg by mouth daily. Take 60 mg total Demadex daily) 60 tablet 3   No current facility-administered medications for this visit.    Discontinued Meds:    Medications Discontinued During This Encounter  Medication Reason  . magnesium hydroxide (MILK OF MAGNESIA) 400 MG/5ML suspension Error    Patient Active Problem List   Diagnosis Date Noted  . Herpes zoster 01/14/2015  . Chronic CHF 01/04/2015  . Edema 06/14/2014  . Moderate tricuspid regurgitation 05/29/2014  . Moderate to severe pulmonary hypertension 05/29/2014  . Atrial fibrillation with RVR 05/28/2014  . Acute pulmonary edema 05/28/2014  . Acute systolic and diastolic CHF 67/34/1937  . Acute respiratory failure with hypoxia 05/28/2014  . Vertigo 02/05/2013  . C2 cervical fracture 02/05/2013  . UTI (urinary tract infection) 03/30/2012  . Generalized weakness 03/30/2012  . Hypokalemia 03/30/2012  .  Dehydration 03/30/2012  . Hypertension 03/30/2012  . Hypothyroidism 03/30/2012  . Rhabdomyolysis 03/30/2012  . Seizure disorder 03/30/2012  . Hyperlipidemia 03/30/2012  . Fall 03/30/2012  . Skin lesion of left leg 10/27/2011  . History of squamous cell carcinoma, right leg, left leg excised 12/21/2011 06/25/2011    LABS    Component Value Date/Time   NA 142 04/21/2015 0630   NA 140 04/17/2015 0712   NA 141 01/24/2015 0745   K 3.6 04/21/2015 0630   K 3.2* 04/17/2015 0712   K 3.8 01/24/2015 0745   CL 101 04/21/2015 0630   CL 100* 04/17/2015 0712   CL 102 01/24/2015 0745   CO2 33* 04/21/2015 0630   CO2 32 04/17/2015 0712   CO2 31 01/24/2015 0745   GLUCOSE 100* 04/21/2015 0630   GLUCOSE 104* 04/17/2015 0712   GLUCOSE 103* 01/24/2015 0745   BUN 26* 04/21/2015 0630   BUN 26* 04/17/2015 0712   BUN 29* 01/24/2015 0745   CREATININE 0.77 04/21/2015 0630   CREATININE 0.80 04/17/2015 0712   CREATININE 0.77 01/24/2015 0745   CALCIUM 8.7* 04/21/2015 0630   CALCIUM 8.4* 04/17/2015 0712   CALCIUM 8.5 01/24/2015 0745   GFRNONAA >60 04/21/2015 0630   GFRNONAA >60 04/17/2015 0712   GFRNONAA 69* 01/24/2015 0745   GFRAA >60 04/21/2015 0630   GFRAA >60 04/17/2015 0712   GFRAA 80* 01/24/2015 0745   CMP  Component Value Date/Time   NA 142 04/21/2015 0630   K 3.6 04/21/2015 0630   CL 101 04/21/2015 0630   CO2 33* 04/21/2015 0630   GLUCOSE 100* 04/21/2015 0630   BUN 26* 04/21/2015 0630   CREATININE 0.77 04/21/2015 0630   CALCIUM 8.7* 04/21/2015 0630   PROT 6.0* 04/17/2015 0712   ALBUMIN 3.3* 04/17/2015 0712   AST 27 04/17/2015 0712   ALT 21 04/17/2015 0712   ALKPHOS 132* 04/17/2015 0712   BILITOT 0.7 04/17/2015 0712   GFRNONAA >60 04/21/2015 0630   GFRAA >60 04/21/2015 0630       Component Value Date/Time   WBC 5.3 04/17/2015 0712   WBC 4.7 01/15/2015 0745   WBC 5.9 12/15/2014 0435   HGB 12.4 04/17/2015 0712   HGB 14.0 01/15/2015 0745   HGB 13.5 12/15/2014 0435   HCT  38.4 04/17/2015 0712   HCT 42.6 01/15/2015 0745   HCT 42.7 12/15/2014 0435   MCV 98.5 04/17/2015 0712   MCV 97.7 01/15/2015 0745   MCV 98.8 12/15/2014 0435    Lipid Panel  No results found for: CHOL, TRIG, HDL, CHOLHDL, VLDL, LDLCALC, LDLDIRECT  ABG    Component Value Date/Time   TCO2 29 08/06/2014 2027     Lab Results  Component Value Date   TSH 2.562 04/17/2015   BNP (last 3 results) No results for input(s): BNP in the last 8760 hours.  ProBNP (last 3 results)  Recent Labs  05/28/14 1000  PROBNP 1935.0*    Cardiac Panel (last 3 results) No results for input(s): CKTOTAL, CKMB, TROPONINI, RELINDX in the last 72 hours.  Iron/TIBC/Ferritin/ %Sat No results found for: IRON, TIBC, FERRITIN, IRONPCTSAT   EKG Orders placed or performed in visit on 05/27/15  . EKG 12-Lead     Prior Assessment and Plan Problem List as of 05/27/2015      Cardiovascular and Mediastinum   Hypertension   Last Assessment & Plan 06/07/2014 Office Visit Written 06/07/2014  3:50 PM by Lendon Colonel, NP    Good control of BP. NO changes in medical regimen.      Atrial fibrillation with RVR   Last Assessment & Plan 06/07/2014 Office Visit Written 06/07/2014  3:50 PM by Lendon Colonel, NP    HR is well controlled.She is tolerating Eliquis without melena or overt bleeding. She has some bruising which I have explained to her is normal. Will see her again in 3 months.      Acute systolic and diastolic CHF   Last Assessment & Plan 06/07/2014 Office Visit Written 06/07/2014  3:49 PM by Lendon Colonel, NP    No evidence of decompensation. Weight is stable. She is adhering to low sodium diet at [facility]. She is actually enjoying her stay there. She will continue current medical regimen without changes. No labs will be drawn today as labs were drawn one week ago and were WNL.  Will see her in 3 months.       Moderate tricuspid regurgitation   Moderate to severe pulmonary hypertension   Chronic  CHF     Respiratory   Acute pulmonary edema   Acute respiratory failure with hypoxia     Endocrine   Hypothyroidism     Nervous and Auditory   Seizure disorder     Musculoskeletal and Integument   Skin lesion of left leg   Rhabdomyolysis   C2 cervical fracture     Genitourinary   UTI (urinary tract infection)  Other   History of squamous cell carcinoma, right leg, left leg excised 12/21/2011   Generalized weakness   Hypokalemia   Dehydration   Hyperlipidemia   Fall   Vertigo   Edema   Herpes zoster       Imaging: No results found.

## 2015-05-27 NOTE — Patient Instructions (Signed)
Your physician wants you to follow-up in: 6 months with Jory Sims, NP. You will receive a reminder letter in the mail two months in advance. If you don't receive a letter, please call our office to schedule the follow-up appointment.  Your physician has recommended you make the following change in your medication:   Levaquin 500 mg Daily for 7 days Mucinex 1200 mg Two times Daily Robitussin 1 Tsp Every 6 hours As Needed for Cough  Please have chest X-Ray done today  Thank you for choosing [facility]!

## 2015-05-28 ENCOUNTER — Non-Acute Institutional Stay (SKILLED_NURSING_FACILITY): Payer: Medicare Other | Admitting: Internal Medicine

## 2015-05-28 ENCOUNTER — Encounter: Payer: Self-pay | Admitting: Internal Medicine

## 2015-05-28 ENCOUNTER — Encounter (HOSPITAL_COMMUNITY)
Admission: AD | Admit: 2015-05-28 | Discharge: 2015-05-28 | Disposition: A | Discharge status: Home / Self Care | Payer: Medicare Other | Source: Skilled Nursing Facility | Attending: Internal Medicine | Admitting: Internal Medicine

## 2015-05-28 DIAGNOSIS — I509 Heart failure, unspecified: Secondary | ICD-10-CM

## 2015-05-28 DIAGNOSIS — I10 Essential (primary) hypertension: Secondary | ICD-10-CM | POA: Insufficient documentation

## 2015-05-28 DIAGNOSIS — I4891 Unspecified atrial fibrillation: Secondary | ICD-10-CM

## 2015-05-28 DIAGNOSIS — Z990 Dependence on aspirator: Secondary | ICD-10-CM | POA: Diagnosis present

## 2015-05-28 DIAGNOSIS — G40909 Epilepsy, unspecified, not intractable, without status epilepticus: Secondary | ICD-10-CM

## 2015-05-28 DIAGNOSIS — J208 Acute bronchitis due to other specified organisms: Secondary | ICD-10-CM | POA: Diagnosis not present

## 2015-05-28 DIAGNOSIS — J209 Acute bronchitis, unspecified: Secondary | ICD-10-CM | POA: Insufficient documentation

## 2015-05-28 DIAGNOSIS — E038 Other specified hypothyroidism: Secondary | ICD-10-CM

## 2015-05-28 LAB — CBC
HCT: 38.5 % (ref 36.0–46.0)
HEMOGLOBIN: 12.7 g/dL (ref 12.0–15.0)
MCH: 32 pg (ref 26.0–34.0)
MCHC: 33 g/dL (ref 30.0–36.0)
MCV: 97 fL (ref 78.0–100.0)
Platelets: 145 10*3/uL — ABNORMAL LOW (ref 150–400)
RBC: 3.97 MIL/uL (ref 3.87–5.11)
RDW: 13.2 % (ref 11.5–15.5)
WBC: 6.4 10*3/uL (ref 4.0–10.5)

## 2015-05-28 LAB — BASIC METABOLIC PANEL
ANION GAP: 8 (ref 5–15)
BUN: 27 mg/dL — ABNORMAL HIGH (ref 6–20)
CALCIUM: 8.6 mg/dL — AB (ref 8.9–10.3)
CO2: 31 mmol/L (ref 22–32)
Chloride: 100 mmol/L — ABNORMAL LOW (ref 101–111)
Creatinine, Ser: 0.95 mg/dL (ref 0.44–1.00)
GFR, EST AFRICAN AMERICAN: 57 mL/min — AB (ref >60)
GFR, EST NON AFRICAN AMERICAN: 49 mL/min — AB (ref >60)
Glucose, Bld: 118 mg/dL — ABNORMAL HIGH (ref 65–99)
Potassium: 3.9 mmol/L (ref 3.5–5.1)
Sodium: 139 mmol/L (ref 135–145)

## 2015-05-28 NOTE — Progress Notes (Signed)
Patient ID: Erica [name], female   DOB: 28-Sep-1917, 81 y.o.   MRN: [id number]          ACILITY: [facility]    LEVEL OF CARE:   SNF  This is a routine- visit.     CHIEF COMPLAINT: Medical management of chronic medical conditions including CHF-atrial fibrillation-hypothyroidism.-  HISTORY OF PRESENT ILLNESS:  This is a 81 year-old whom we initially admitted to the building in early September.  She had been brought to the hospital with progressive shortness of breath, dyspnea, and orthopnea.   She was found to be in atrial fibrillation with rapid ventricular response.  Echocardiogram showed systolic function of 10-62% with diffuse hypokinesis, grade 2 diastolic dysfunction, severe left atrial enlargement, moderate TR, and moderate to severe pulmonary hypertension.  She was put on Toprol XL and Eliquis  Patient went home for short while-however apparently there was not a support network in place to address all of her needs-and she returned to the facility apparently 10 days later-Dr. Dellia Nims was concerned about some weight gain and edema and did increase her Lasix --she was seen by cardiology and they switched her to torsemide 60 mg daily---  Her weight appears to have stabilized currently 154 this is been stable now for some time    She does not complain of any shortness of breath   She did see a physician yesterday who apparently  she was having some respiratory issues she did have cough chest x-ray was ordered which a does not show any acute process she has been started on Mucinex as well as a weeklong course of Levaquin this appears to be helping she says her cough is better today she is feeling improved-   Several months ago she was seen for a rash that was thought to be herpes zoster related this appears to have resolved status post treatment with anti-viral     In regards to her other issues She does have a history of hypothyroidism-she is on Synthroid this  was increased  to 200 g a day TSH in early November was 10.96-did come down to 6.669 on December 7--and normalized at 3.989 on December 31 it remain normal most recent lab in July was 2.562.  She also has a history seizure disorder this is been stable during her stay here most Dilantin level in July  was 13.1 she is on Dilantin 3 times a day--50 mg in the morning and 100 mg 2 later in the day We will update this  She was seen by an audiologist  In July for difficulty hearing apparently her ears were cleaned out-apparently a hearing aid was discussed but patient does not really want one apparently              PAST MEDICAL HISTORY/PROBLEM LIST:      Hypertension with LVH and grade 2 diastolic dysfunction.    Hemorrhoids.    Thyroid disease.    Osteoarthritis.    History of a seizure disorder.     History of depression.    Hyperlipidemia.    History of skin cancer.    PAST SURGICAL HISTORY:    Cataract extraction bilaterally.    Carpal tunnel release in the left hand.    Shoulder arthroplasty on the right.    Total hip arthroplasty on the right.    Total knee arthroplasty on the right.    Total knee arthroplasty on the left.    Cholecystectomy.    Cyst excision.  Abdominal hysterectomy.    Tonsillectomy.    Skin cancer excision.    Open reduction and internal fixation of the distal radius in 2014.    CURRENT MEDICATIONS:  Medication list is reviewed .    Tylenol plain 650 q.6 p.r.n.    Eliquis 2.5 b.i.d.   Torsemide 60 mg every morning.    Synthroid2 00 mcg daily. .    Metoprolol extended-release 75 mg once a day.    Paxil 20 mg once a day.    Vitamin D3, 2000 U once a day.    Dilantin 50 mg every morning-100 mg twice a day later in the day.   KCl 60 Meq  in the morning-40 Meq p.m Of note she is also on  week long course of Levaquin also on Mucinex 12 mg twice a day for now.   SOCIAL HISTORY:   HOUSING:  As mentioned, the patient was  living in her own home before the hospitalization when she first came here, and we discharged her home.  Apparently, the social support network that was there previously was just not there and this may have more to do with her readmission here than anything else.   CODE STATUS:  The patient is a DNR.   Marland Kitchen    FAMILY HISTORY:  None related by the patient.     REVIEW OF SYSTEMS In general no complaints of fever or chills her weight appears to have stabilized.  Skin  She did recently have a lesion removed from her foot this was followed by dermatology.     Head ears eyes nose mouth and throat --does not complain of sore throat or visual changes Has some difficulty hearing has seen an audiologist as noted above.  Respiratory--does not complan shortness of breath --has a cough but says this is improving .  GI does not complain of any abdominal discomfort nausea vomiting diarrhea or constipation.  Musculoskeletal is not complaining of any joint pain  GU does not complain of dysuria.  Neurologic does not complain of dizziness headache or syncopal-type episodes.  Psych-apparently at times does have some anxiety-appears to be fully cognizant:        PHYSICAL EXAMINATION:      Temperature 97.2 pulse 64 respirations 20 blood pressure 102/51 weight is stable at 154.4 .   GENERAL APPEARANCE:  The patient is not in any distress.  Comfortable in a wheelchair.  Her skin is warm and dry does have numerous solar induced changes face upper extremities especially--. This appears relatively unchanged she is status post lesion removal right foot   t  Eyes pupils appear reactive to light sclera and conjunctiva clear.  Oropharynx clear mucous membranes moist.  Chest is clear to auscultation there is no labored breathing somewhat shallow air entry   Heart is regular rate and rhythm--with occasional irregular beat-- without murmur gallop or rub she appears to have 1+ lower extremity edema  although this appears to be somewhat increased on the left pedal pulse somewhat difficult to assess secondary to edema-she does have chronic venous stasis changes which is not new Abdomen is soft nontender with positive bowel sounds is somewhat protuberant  Musculoskeletal-I did not note any deformities other than arthritic changes she ambulates in a wheelchair largely strength appears to be intact all 4 extremities with some lower extremity weakness I which is baseline.  Neurologic is grossly intact no lateralizing findings cranial nerves intact speech is clear.  Psych she is alert and oriented pleasant and appropriate.  Labs  05/28/2015.  Sodium 139 potassium 3.9 BUN 27 creatinine 0.95.  WBC 6.4 hemoglobin 12.7 crit was 145.  04/17/2015.  NFA-2.130.  04/21/2015.  Magnesium 2.0.  04/17/2015.  Lantus 13.1.  Liver function tests within normal limits except albumin of 3.3 alkaline phosphatase 132  . 01/24/2015.  Sodium 141 potassium 3.8 BUN 29 creatinine 0.77  01/15/2015.  TSH-2.155.  Sodium 138 potassium 4 BUN 27 creatinine 0.78.  Liver function tests within normal limits except albumen of 3.2L, phosphatase 165 she has a history of some mildly elevated alkaline phosphatase.  Dilantin level XIII.3.  WBC 4.7 hemoglobin 14.0 platelets 173.        01/06/2015.  Sodium 139 potassium 3.5 BUN 25 creatinine 0.75.  12/15/2014.  WBC 5.9 hemoglobin 13.5 platelets 186.  Liver function tests within normal limits except alkaline phosphatase 165  11/28/2014.  Dilantin level XI.9.  11/22/2014.  Sodium 141 potassium 3.8 BUN 29 creatinine 0.75  11/11/2014.  Magnesium-1.9.  10/28/2014.  GGT-165.  10/16/2014.  Liver function tests within normal limits except alkaline phosphatase 189 AST 40 ALT 39.  10/10/2014.  WBC 5.8 hemoglobin 13.0 platelets 180    11/18/1998  09/16/2014.  TSH 6.669.  08/30/2014 Dilantin level  XVI.9.    08/15/2014.  QMV-78.469.  WBC 5.9 hemoglobin 14.8.  Sodium 139 potassium 3.5 BUN 16 creatinine 0.6.  Alkaline phosphatase 152 and albumin 3.3 otherwise liver function tests within normal limits.  Dilantin level III.1.   Marland Kitchen       Marland Kitchen    r.    ASSESSMENT/PLAN:     Possibly biventricular heart failure.  She has moderate to severe pulmonary hypertension.  .  Clinically she appears stable weight has been stable  She is followed by cardiology she is currently on torsemide with aggressive potassium supplementation-torsemide was  increased to 60 mg a day-her weight appears to have stabilized-clinically she appears stable--[place] panel shows stability      Previous echocardiogram showed an EF of 62-95%, grade 2 diastolic dysfunction, moderate LVH.  Pulmonary artery systolic pressure was 59.      Atrial fibrillation.  This appears rate controlled on current dose of metoprolol-she is on Eliquis for anticoagulation     Cough. --This appears to be improved she is on Mucinex as well as a weeklong course of Levaquin for suspected bronchitis per outside provider   She also has when necessary Robitussin for now     Severe bilateral venous stasis.  This appears to be at baselin   History of depression. --This appears to be stable she is on Paxil-initially had some subdued episodes on initial coming back to facility-but she appears to have adapted well--she understands the need for increased care at this point       Seizure disorder.   Her Dilantin level initially was low on readmission-then it became borderline high-  This appears to have stabilized on current dose Will update level                                             Hypothyroidism-she continues on Synthroid --recent TSH shows stability    Some history of mildly elevated liver function tests-alkaline phosphatase remains mildly elevated-secondary to patient's advanced age and comorbidities have not been  aggressive in pursuing further workup-clinically she is stable  What appears to be some increased edema of her left leg will order a  venous Doppler to rule out any DVT.       [id number]        ' .

## 2015-05-29 DIAGNOSIS — I10 Essential (primary) hypertension: Secondary | ICD-10-CM | POA: Diagnosis not present

## 2015-05-29 LAB — PHENYTOIN LEVEL, TOTAL: Phenytoin Lvl: 16.4 ug/mL (ref 10.0–20.0)

## 2015-05-30 ENCOUNTER — Ambulatory Visit (HOSPITAL_COMMUNITY)
Admission: RE | Admit: 2015-05-30 | Discharge: 2015-05-30 | Disposition: A | Discharge status: Home / Self Care | Payer: Medicare Other | Source: Ambulatory Visit | Attending: Internal Medicine | Admitting: Internal Medicine

## 2015-05-30 DIAGNOSIS — R600 Localized edema: Secondary | ICD-10-CM | POA: Diagnosis present

## 2015-06-25 ENCOUNTER — Non-Acute Institutional Stay (SKILLED_NURSING_FACILITY): Payer: Medicare Other | Admitting: Internal Medicine

## 2015-06-25 ENCOUNTER — Encounter: Payer: Self-pay | Admitting: Internal Medicine

## 2015-06-25 DIAGNOSIS — I10 Essential (primary) hypertension: Secondary | ICD-10-CM | POA: Diagnosis not present

## 2015-06-25 DIAGNOSIS — I5041 Acute combined systolic (congestive) and diastolic (congestive) heart failure: Secondary | ICD-10-CM | POA: Diagnosis not present

## 2015-06-25 DIAGNOSIS — E038 Other specified hypothyroidism: Secondary | ICD-10-CM

## 2015-06-25 DIAGNOSIS — I4891 Unspecified atrial fibrillation: Secondary | ICD-10-CM | POA: Diagnosis not present

## 2015-06-25 DIAGNOSIS — G40909 Epilepsy, unspecified, not intractable, without status epilepticus: Secondary | ICD-10-CM

## 2015-06-25 NOTE — Progress Notes (Signed)
Patient ID: Erica [name], female   DOB: Jun 18, 1917, 81 y.o.   MRN: [id number]           ACILITY: [facility]    LEVEL OF CARE:   SNF  This is a routine- visit.     CHIEF COMPLAINT: Medical management of chronic medical conditions including CHF-atrial fibrillation-hypothyroidism.-  HISTORY OF PRESENT ILLNESS:  This is a 81 year-old whom we initially admitted to the building in early September.  She had been brought to the hospital with progressive shortness of breath, dyspnea, and orthopnea.   She was found to be in atrial fibrillation with rapid ventricular response.  Echocardiogram showed systolic function of 51-88% with diffuse hypokinesis, grade 2 diastolic dysfunction, severe left atrial enlargement, moderate TR, and moderate to severe pulmonary hypertension.  She was put on Toprol XL and Eliquis  Patient went home for short while-however apparently there was not a support network in place to address all of her needs-and she returned to the facility apparently 10 days later-Dr. Dellia Nims was concerned about some weight gain and edema and did increase her Lasix --she was seen by cardiology and they switched her to torsemide 60 mg daily---  Her weight appears to have stabilized currently 155.6 this is been stable now for some time    She was treated for suspected bronchitis about a month ago and this appears to have resolved       Several months ago she was seen for a rash that was thought to be herpes zoster related this appears to have resolved status post treatment with anti-viral     In regards to her other issues She does have a history of hypothyroidism-she is on Synthroid this was increased  to 200 g a day TSH in early November was 10.96-did come down to 6.669 on December 7--and normalized at 3.989 on December 31 it remain normal most recent lab in July was 2.562.  She also has a history seizure disorder this is been stable during her stay here most   recent Dilantin on 05/29/2015 was 16.4-- she is on Dilantin 3 times a day--50 mg in the morning and 100 mg 2 later in the da  She was seen by an audiologist  In July for difficulty hearing apparently her ears were cleaned out-apparently a hearing aid was discussed but patient does not really want one apparently              PAST MEDICAL HISTORY/PROBLEM LIST:      Hypertension with LVH and grade 2 diastolic dysfunction.    Hemorrhoids.    Thyroid disease.    Osteoarthritis.    History of a seizure disorder.     History of depression.    Hyperlipidemia.    History of skin cancer.    PAST SURGICAL HISTORY:    Cataract extraction bilaterally.    Carpal tunnel release in the left hand.    Shoulder arthroplasty on the right.    Total hip arthroplasty on the right.    Total knee arthroplasty on the right.    Total knee arthroplasty on the left.    Cholecystectomy.    Cyst excision.    Abdominal hysterectomy.    Tonsillectomy.    Skin cancer excision.    Open reduction and internal fixation of the distal radius in 2014.    CURRENT MEDICATIONS:  Medication list is reviewed .    Tylenol plain 650 q.6 p.r.n.    Eliquis 2.5 b.i.d.  Torsemide 60 mg every morning.    Synthroid2 00 mcg daily. .    Metoprolol extended-release 75 mg once a day.    Paxil 20 mg once a day.    Vitamin D3, 2000 U once a day.    Dilantin 50 mg every morning-100 mg twice a day later in the day.   KCl 60 Meq  in the morning-40 Meq p.m Of note she is also on  week long course of Levaquin also on Mucinex 12 mg twice a day for now.   SOCIAL HISTORY:   HOUSING:  As mentioned, the patient was living in her own home before the hospitalization when she first came here, and we discharged her home.  Apparently, the social support network that was there previously was just not there and this may have more to do with her readmission here than anything else.   CODE STATUS:  The patient is a  DNR.   Marland Kitchen    FAMILY HISTORY:  None related by the patient.     REVIEW OF SYSTEMS In general no complaints of fever or chills her weight appears to have stabilized.  Skin  She did recently have a lesion removed from her foot this was followed by dermatology.--Does not have any recent complaints     Head ears eyes nose mouth and throat --does not complain of sore throat or visual changes Has some difficulty hearing has seen an audiologist as noted above.  Respiratory--does not complan shortness of breath --or cough currently .  GI does not complain of any abdominal discomfort nausea vomiting diarrhea or constipation.  Musculoskeletal is not complaining of any joint pain  GU does not complain of dysuria.  Neurologic does not complain of dizziness headache or syncopal-type episodes.  Psych-apparently at times does have some anxiety-appears to be fully cognizant:        PHYSICAL EXAMINATION:     T-. 98.6 pulse 60 respirations 16 blood pressure 112/45 weight is stable at 155.6   .   GENERAL APPEARANCE:  The patient is not in any distress.  Comfortable in a wheelchair.  Her skin is warm and dry does have numerous solar induced changes face upper extremities especially--. This appears relatively unchanged    t  Eyes pupils appear reactive to light sclera and conjunctiva clear.  Oropharynx clear mucous membranes moist.  Chest is clear to auscultation there is no labored breathing somewhat shallow air entry   Heart is regular rate and rhythm--with occasional irregular beat-- without murmur gallop or rub she appears to have 1+ lower extremity edema Bilat -she does have chronic venous stasis changes which is not new Abdomen is soft nontender with positive bowel sounds is somewhat protuberant  Musculoskeletal-I did not note any deformities other than arthritic changes she ambulates in a wheelchair largely strength appears to be intact all 4 extremities with some lower  extremity weakness I which is baseline.  Neurologic is grossly intact no lateralizing findings cranial nerves intact speech is clear.  Psych she is alert and oriented pleasant and appropriate.  Labs  05/29/2015.  Dilantin level 16.4  05/28/2015.  Sodium 139 potassium 3.9 BUN 27 creatinine 0.95.  WBC 6.4 hemoglobin 12.7 crit was 145.  04/17/2015.  ERX-5.400.  04/21/2015.  Magnesium 2.0.  04/17/2015.  Lantus 13.1.  Liver function tests within normal limits except albumin of 3.3 alkaline phosphatase 132  . 01/24/2015.  Sodium 141 potassium 3.8 BUN 29 creatinine 0.77  01/15/2015.  TSH-2.155.  Sodium 138 potassium 4  BUN 27 creatinine 0.78.  Liver function tests within normal limits except albumen of 3.2L, phosphatase 165 she has a history of some mildly elevated alkaline phosphatase.  Dilantin level XIII.3.  WBC 4.7 hemoglobin 14.0 platelets 173.        01/06/2015.  Sodium 139 potassium 3.5 BUN 25 creatinine 0.75.  12/15/2014.  WBC 5.9 hemoglobin 13.5 platelets 186.  Liver function tests within normal limits except alkaline phosphatase 165  11/28/2014.  Dilantin level XI.9.  11/22/2014.  Sodium 141 potassium 3.8 BUN 29 creatinine 0.75  11/11/2014.  Magnesium-1.9.  10/28/2014.  GGT-165.  10/16/2014.  Liver function tests within normal limits except alkaline phosphatase 189 AST 40 ALT 39.  10/10/2014.  WBC 5.8 hemoglobin 13.0 platelets 180    11/18/1998  09/16/2014.  TSH 6.669.  08/30/2014 Dilantin level XVI.9.    08/15/2014.  OFH-21.975.  WBC 5.9 hemoglobin 14.8.  Sodium 139 potassium 3.5 BUN 16 creatinine 0.6.  Alkaline phosphatase 152 and albumin 3.3 otherwise liver function tests within normal limits.  Dilantin level III.1.   Marland Kitchen       Marland Kitchen    r.    ASSESSMENT/PLAN:     Possibly biventricular heart failure.  She has moderate to severe pulmonary hypertension.  .  Clinically she appears stable weight has  been stable  She is followed by cardiology she is currently on torsemide with aggressive potassium supplementation-torsemide was  increased to 60 mg a day-her weight appears to have stabilized-clinically she appears stable-recent metabolic panel shows stability we will update this next week      Previous echocardiogram showed an EF of 88-32%, grade 2 diastolic dysfunction, moderate LVH.  Pulmonary artery systolic pressure was 59.      Atrial fibrillation.  This appears rate controlled on current dose of metoprolol-she is on Eliquis for anticoagulation       Severe bilateral venous stasis.  This appears to be at baseline   History of depression. --This appears to be stable she is on Paxil-initially had some subdued episodes on initial coming back to facility-but she appears to have adapted well--she understands the need for increased care at this point       Seizure disorder.   Her Dilantin level initially was low on readmission-then it became borderline high-  This appears to have stabilized on current dose Will update levelnext week when other labs are drawn                                             Hypothyroidism-she continues on Synthroid --recent TSH shows stability    Some history of mildly elevated liver function tests-alkaline phosphatase remains mildly elevated-secondary to patient's advanced age and comorbidities have not been aggressive in pursuing further workup-clinically she is stable--will update this next week as well   .       [id number]        ' .

## 2015-06-30 ENCOUNTER — Encounter (HOSPITAL_COMMUNITY)
Admission: RE | Admit: 2015-06-30 | Discharge: 2015-06-30 | Disposition: A | Discharge status: Home / Self Care | Payer: Medicare Other | Source: Skilled Nursing Facility | Attending: Internal Medicine | Admitting: Internal Medicine

## 2015-06-30 DIAGNOSIS — Z79899 Other long term (current) drug therapy: Secondary | ICD-10-CM | POA: Diagnosis present

## 2015-06-30 DIAGNOSIS — I10 Essential (primary) hypertension: Secondary | ICD-10-CM | POA: Diagnosis present

## 2015-06-30 LAB — COMPREHENSIVE METABOLIC PANEL
ALT: 18 U/L (ref 14–54)
ANION GAP: 6 (ref 5–15)
AST: 23 U/L (ref 15–41)
Albumin: 3.2 g/dL — ABNORMAL LOW (ref 3.5–5.0)
Alkaline Phosphatase: 118 U/L (ref 38–126)
BUN: 30 mg/dL — ABNORMAL HIGH (ref 6–20)
CALCIUM: 8.3 mg/dL — AB (ref 8.9–10.3)
CHLORIDE: 100 mmol/L — AB (ref 101–111)
CO2: 33 mmol/L — AB (ref 22–32)
Creatinine, Ser: 0.78 mg/dL (ref 0.44–1.00)
GFR calc non Af Amer: >60 mL/min (ref >60)
Glucose, Bld: 98 mg/dL (ref 65–99)
POTASSIUM: 3.4 mmol/L — AB (ref 3.5–5.1)
SODIUM: 139 mmol/L (ref 135–145)
Total Bilirubin: 0.6 mg/dL (ref 0.3–1.2)
Total Protein: 5.9 g/dL — ABNORMAL LOW (ref 6.5–8.1)

## 2015-06-30 LAB — CBC
HCT: 36.3 % (ref 36.0–46.0)
Hemoglobin: 11.8 g/dL — ABNORMAL LOW (ref 12.0–15.0)
MCH: 31.9 pg (ref 26.0–34.0)
MCHC: 32.5 g/dL (ref 30.0–36.0)
MCV: 98.1 fL (ref 78.0–100.0)
PLATELETS: 161 10*3/uL (ref 150–400)
RBC: 3.7 MIL/uL — ABNORMAL LOW (ref 3.87–5.11)
RDW: 13.8 % (ref 11.5–15.5)
WBC: 5.3 10*3/uL (ref 4.0–10.5)

## 2015-06-30 LAB — PHENYTOIN LEVEL, TOTAL: Phenytoin Lvl: 13.7 ug/mL (ref 10.0–20.0)

## 2015-08-27 ENCOUNTER — Encounter: Payer: Self-pay | Admitting: Internal Medicine

## 2015-08-27 ENCOUNTER — Non-Acute Institutional Stay (SKILLED_NURSING_FACILITY): Payer: Medicare Other | Admitting: Internal Medicine

## 2015-08-27 DIAGNOSIS — M25511 Pain in right shoulder: Secondary | ICD-10-CM | POA: Diagnosis not present

## 2015-08-27 DIAGNOSIS — M25519 Pain in unspecified shoulder: Secondary | ICD-10-CM | POA: Insufficient documentation

## 2015-08-27 DIAGNOSIS — I5042 Chronic combined systolic (congestive) and diastolic (congestive) heart failure: Secondary | ICD-10-CM | POA: Diagnosis not present

## 2015-08-27 DIAGNOSIS — G40909 Epilepsy, unspecified, not intractable, without status epilepticus: Secondary | ICD-10-CM

## 2015-08-27 DIAGNOSIS — E038 Other specified hypothyroidism: Secondary | ICD-10-CM | POA: Diagnosis not present

## 2015-08-27 DIAGNOSIS — I4891 Unspecified atrial fibrillation: Secondary | ICD-10-CM

## 2015-08-27 NOTE — Progress Notes (Signed)
Patient ID: Erica [name], female   DOB: 11/14/1916, 81 y.o.   MRN: [id number]            ACILITY: [facility]    LEVEL OF CARE:   SNF  This is a routine- visit.     CHIEF COMPLAINT: Medical management of chronic medical conditions including CHF-atrial fibrillation-hypothyroidism.-  HISTORY OF PRESENT ILLNESS:  This is a 81 year-old female  we initially admitted to the building after .  She had been brought to the hospital with progressive shortness of breath, dyspnea, and orthopnea.   She was found to be in atrial fibrillation with rapid ventricular response.  Echocardiogram showed systolic function of [id number] with diffuse hypokinesis, grade 2 diastolic dysfunction, severe left atrial enlargement, moderate TR, and moderate to severe pulmonary hypertension.  She was put on Toprol XL and Eliquis  Patient went home for short while-however apparently there was not a support network in place to address all of her needs-and she returned to the facility apparently 10 days later-Dr. Dellia Nims was concerned about some weight gain and edema and did increase her Lasix --she was seen by cardiology and they switched her to torsemide 60 mg daily---  Her weight appears to have stabilized currently 156.6 this has been stable now for some time            Several months ago she was seen for a rash that was thought to be herpes zoster related this appears to have resolved status post treatment with anti-viral     In regards to her other issues She does have a history of hypothyroidism-she is on Synthroid t TSH back in July was within normal range at 2.562 --  She also has a history seizure disorder this is been stable during her stay here most  recent Dilantin on 06/30/2015 was 13.7-- she is on Dilantin 3 times a day--50 mg in the morning and 100 mg 2 later in the da  She was seen by an audiologist  In July for difficulty hearing apparently her ears were cleaned  out-apparently a hearing aid was discussed but patient does not really want one apparently  Patient was complaining of some right shoulder discomfort she says this is bursitis and occurs about this time every year-she does have a history it appears  of a shoulder replacement sometime in the distant past-she does not complaining of any recent trauma to the area              PAST MEDICAL HISTORY/PROBLEM LIST:      Hypertension with LVH and grade 2 diastolic dysfunction.    Hemorrhoids.    Thyroid disease.    Osteoarthritis.    History of a seizure disorder.     History of depression.    Hyperlipidemia.    History of skin cancer.    PAST SURGICAL HISTORY:    Cataract extraction bilaterally.    Carpal tunnel release in the left hand.    Shoulder arthroplasty on the right.    Total hip arthroplasty on the right.    Total knee arthroplasty on the right.    Total knee arthroplasty on the left.    Cholecystectomy.    Cyst excision.    Abdominal hysterectomy.    Tonsillectomy.    Skin cancer excision.    Open reduction and internal fixation of the distal radius in 2014.    CURRENT MEDICATIONS:  Medication list is reviewed .    Tylenol plain 650 q.6 p.r.n.  Eliquis 2.5 b.i.d.   Torsemide 60 mg every morning.    Synthroid2 00 mcg daily. .    Metoprolol extended-release 75 mg once a day.    Paxil 20 mg once a day.    Vitamin D3, 2000 U once a day.    Dilantin 50 mg every morning-100 mg twice a day later in the day.   KCl 80 mEq in the morning-60 mEq later in the day Of note she is also on  week long course of Levaquin also on Mucinex 12 mg twice a day for now.   SOCIAL HISTORY:   HOUSING:  As mentioned, the patient was living in her own home before the hospitalization when she first came here, and we discharged her home.  Apparently, the social support network that was there previously was just not there and this may have more to do with her  readmission here than anything else.   CODE STATUS:  The patient is a DNR.   Marland Kitchen    FAMILY HISTORY:  None related by the patient.     REVIEW OF SYSTEMS In general no complaints of fever or chills her weight appears to have stabilized.  Skin  She did recently have a lesion removed from her foot this was followed by dermatology.--Does not have any recent complaints     Head ears eyes nose mouth and throat --does not complain of sore throat or visual changes Has some difficulty hearing has seen an audiologist as noted above.  Respiratory--does not complan shortness of breath --or cough currently .  GI does not complain of any abdominal discomfort nausea vomiting diarrhea or constipation.  Musculoskeletal--is complaining of some right shoulder discomfort otherwise no complaints  GU does not complain of dysuria.  Neurologic does not complain of dizziness headache or syncopal-type episodes.  Psych-apparently at times does have some anxiety-appears to be fully cognizant:        PHYSICAL EXAMINATION:     Temperature is 97.4 pulse 76 respirations 20 blood pressure 102/67 weight is stable at 156.6   .   GENERAL APPEARANCE:  The patient is not in any distress.  Comfortable in a wheelchair.  Her skin is warm and dry does have numerous solar induced changes face upper extremities especially--. This appears relatively unchanged    t  Eyes pupils appear reactive to light sclera and conjunctiva clear.  Oropharynx clear mucous membranes moist.  Chest is clear to auscultation there is no labored breathing somewhat shallow air entry   Heart is regular rate and rhythm--with occasional irregular beat-- without murmur gallop or rub she appears to have 1+ lower extremity edema Bilat--which appears to be stable -she does have chronic venous stasis changes which is not new Abdomen is soft nontender with positive bowel sounds is somewhat protuberant  Musculoskeletal-I did not note any  deformities other than arthritic changes she ambulates in a wheelchair largely strength appears to be intact all 4 extremities with some lower extremity weakness I which is baseline There is some tenderness to palpation of the right shoulder area I do not note any increased edema or erythema or deformity--she does have limited range of motion at the right shoulder although not sure this is grossly changed from her baseline.  Neurologic is grossly intact no lateralizing findings cranial nerves intact speech is clear.  Psych she is alert and oriented pleasant and appropriate.  Labs  06/30/2015.  WBC 5.3 hemoglobin 11.8 platelets 161.  06/30/2015.  Sodium 139 potassium 3.4 BUN 30  creatinine 0.78.  Albumin 3.2 otherwise liver function tests within normal limits.  Dilantin level XIII.7.    05/29/2015.  Dilantin level 16.4  05/28/2015.  Sodium 139 potassium 3.9 BUN 27 creatinine 0.95.  WBC 6.4 hemoglobin 12.7 crit was 145.  04/17/2015.  [id number].  04/21/2015.  Magnesium 2.0.  04/17/2015.  Lantus 13.1.  Liver function tests within normal limits except albumin of 3.3 alkaline phosphatase 132  . 01/24/2015.  Sodium 141 potassium 3.8 BUN 29 creatinine 0.77  01/15/2015.  TSH-2.155.  Sodium 138 potassium 4 BUN 27 creatinine 0.78.  Liver function tests within normal limits except albumen of 3.2L, phosphatase 165 she has a history of some mildly elevated alkaline phosphatase.  Dilantin level XIII.3.  WBC 4.7 hemoglobin 14.0 platelets 173.        01/06/2015.  Sodium 139 potassium 3.5 BUN 25 creatinine 0.75.  12/15/2014.  WBC 5.9 hemoglobin 13.5 platelets 186.  Liver function tests within normal limits except alkaline phosphatase 165  11/28/2014.  Dilantin level XI.9.  11/22/2014.  Sodium 141 potassium 3.8 BUN 29 creatinine 0.75  11/11/2014.  Magnesium-1.9.  10/28/2014.  GGT-165.  10/16/2014.  Liver function tests within normal limits  except alkaline phosphatase 189 AST 40 ALT 39.  10/10/2014.  WBC 5.8 hemoglobin 13.0 platelets 180    11/18/1998  09/16/2014.  TSH 6.669.  08/30/2014 Dilantin level XVI.9.    08/15/2014.  [id number].  WBC 5.9 hemoglobin 14.8.  Sodium 139 potassium 3.5 BUN 16 creatinine 0.6.  Alkaline phosphatase 152 and albumin 3.3 otherwise liver function tests within normal limits.  Dilantin level III.1.   Marland Kitchen       Marland Kitchen    r.    ASSESSMENT/PLAN:     Possibly biventricular heart failure.  She has moderate to severe pulmonary hypertension.  .  Clinically she appears stable weight has been stable  She is followed by cardiology she is currently on torsemide with aggressive potassium supplementation-torsemide was  increased to 60 mg a day-her weight appears to have stabilized-clinically she appears stable-recent metabolic panel shows stability we will update this       Previous echocardiogram showed an EF of A999333, grade 2 diastolic dysfunction, moderate LVH.  Pulmonary artery systolic pressure was 59.      Atrial fibrillation.  This appears rate controlled on current dose of metoprolol-she is on Eliquis for anticoagulation--       Severe bilateral venous stasis.  This appears to be at baseline   History of depression. --This appears to be stable she is on Paxil-initially had some subdued episodes on initial coming back to facility-but she appears to have adapted well--she understands the need for increased care at this point       Seizure disorder.   Her Dilantin level initially was low on readmission-then it became borderline high-  This appears to have stabilized on current dose Will update level                                             Hypothyroidism-she continues on Synthroid --recent TSH shows stabilit--will update TSH  Some history of mildly elevated liver function tests-has had mildly elevated alkaline  phosphatase at times will update this.   Right shoulder pain-physical exam did not show any obvious etiology-patient states she does have bursitis at times-will update an x-ray of the area-for pain relief will start tramadol 50 mg every 8 hours when necessary for a one-week course continue to monitor this  Also will update an RA factor as well as sedimentation rate   [id number]-of note greater than 35 minutes spent assessing patient-discussing her concerns-reviewing her chart-and coordinating and formulating a plan of care for numerous diagnoses-of note greater than 50% of time spent coordinating plan of care    .       [id number]        ' .

## 2015-08-28 ENCOUNTER — Ambulatory Visit (HOSPITAL_COMMUNITY): Payer: Medicare Other

## 2015-08-28 ENCOUNTER — Encounter (HOSPITAL_COMMUNITY)
Admission: AD | Admit: 2015-08-28 | Discharge: 2015-08-28 | Disposition: A | Discharge status: Home / Self Care | Payer: Medicare Other | Source: Skilled Nursing Facility | Attending: Internal Medicine | Admitting: Internal Medicine

## 2015-08-28 DIAGNOSIS — R52 Pain, unspecified: Secondary | ICD-10-CM | POA: Diagnosis present

## 2015-08-28 LAB — PHENYTOIN LEVEL, TOTAL: Phenytoin Lvl: 13 ug/mL (ref 10.0–20.0)

## 2015-08-28 LAB — TSH: TSH: 2.216 u[IU]/mL (ref 0.350–4.500)

## 2015-08-28 LAB — CBC WITH DIFFERENTIAL/PLATELET
BASOS ABS: 0 10*3/uL (ref 0.0–0.1)
BASOS PCT: 1 %
EOS ABS: 0.2 10*3/uL (ref 0.0–0.7)
EOS PCT: 5 %
HCT: 36.8 % (ref 36.0–46.0)
Hemoglobin: 12 g/dL (ref 12.0–15.0)
LYMPHS ABS: 1.5 10*3/uL (ref 0.7–4.0)
Lymphocytes Relative: 33 %
MCH: 32.4 pg (ref 26.0–34.0)
MCHC: 32.6 g/dL (ref 30.0–36.0)
MCV: 99.5 fL (ref 78.0–100.0)
Monocytes Absolute: 0.5 10*3/uL (ref 0.1–1.0)
Monocytes Relative: 12 %
Neutro Abs: 2.2 10*3/uL (ref 1.7–7.7)
Neutrophils Relative %: 49 %
PLATELETS: 149 10*3/uL — AB (ref 150–400)
RBC: 3.7 MIL/uL — AB (ref 3.87–5.11)
RDW: 13.3 % (ref 11.5–15.5)
WBC: 4.4 10*3/uL (ref 4.0–10.5)

## 2015-08-28 LAB — COMPREHENSIVE METABOLIC PANEL
ALK PHOS: 119 U/L (ref 38–126)
ALT: 23 U/L (ref 14–54)
AST: 28 U/L (ref 15–41)
Albumin: 3.1 g/dL — ABNORMAL LOW (ref 3.5–5.0)
Anion gap: 5 (ref 5–15)
BILIRUBIN TOTAL: 0.6 mg/dL (ref 0.3–1.2)
BUN: 32 mg/dL — AB (ref 6–20)
CALCIUM: 8.4 mg/dL — AB (ref 8.9–10.3)
CO2: 29 mmol/L (ref 22–32)
CREATININE: 0.86 mg/dL (ref 0.44–1.00)
Chloride: 106 mmol/L (ref 101–111)
GFR calc Af Amer: >60 mL/min (ref >60)
GFR, EST NON AFRICAN AMERICAN: 55 mL/min — AB (ref >60)
Glucose, Bld: 98 mg/dL (ref 65–99)
Potassium: 3.8 mmol/L (ref 3.5–5.1)
Sodium: 140 mmol/L (ref 135–145)
TOTAL PROTEIN: 5.8 g/dL — AB (ref 6.5–8.1)

## 2015-08-28 LAB — SEDIMENTATION RATE: SED RATE: 20 mm/h (ref 0–22)

## 2015-08-29 LAB — RHEUMATOID FACTOR

## 2015-09-01 ENCOUNTER — Non-Acute Institutional Stay (SKILLED_NURSING_FACILITY): Payer: Medicare Other | Admitting: Internal Medicine

## 2015-09-01 DIAGNOSIS — M12511 Traumatic arthropathy, right shoulder: Secondary | ICD-10-CM

## 2015-09-01 DIAGNOSIS — I5042 Chronic combined systolic (congestive) and diastolic (congestive) heart failure: Secondary | ICD-10-CM

## 2015-09-01 DIAGNOSIS — M75101 Unspecified rotator cuff tear or rupture of right shoulder, not specified as traumatic: Secondary | ICD-10-CM

## 2015-09-01 DIAGNOSIS — M12811 Other specific arthropathies, not elsewhere classified, right shoulder: Secondary | ICD-10-CM

## 2015-09-01 DIAGNOSIS — M25511 Pain in right shoulder: Secondary | ICD-10-CM

## 2015-09-09 NOTE — Progress Notes (Signed)
Patient ID: Erica [name], female   DOB: [birth year], 81 y.o.   MRN: [id number]                PROGRESS NOTE  DATE:  09/01/2015          FACILITY: [facility]       LEVEL OF CARE:   SNF   Acute Visit                   CHIEF COMPLAINT:  Follow up right shoulder pain.     HISTORY OF PRESENT ILLNESS:  This is a patient who has been in the building for quite a period of time now, originally due to atrial fibrillation and combined systolic and diastolic heart failure.  From that point of view, she has done fairly well.  She graduated into the assisted living level of the building and has been reasonably stable.  She is on torsemide 60 in the morning, KCl at 80 mEq in the morning and 60 later in the day.    Apparently within the last week or two, she started to complain of right shoulder pain which has made it almost impossible for her to move her arm.  She was seen last week.  An x-ray was done and showed degenerative changes.  She has had surgery on the right shoulder, and also listed as having a rotator cuff repair but on the left shoulder.   There is apparently no history of trauma.  She does not have a history of crystal arthropathy.    PAST MEDICAL HISTORY/PROBLEM LIST:  Reviewed.             PAST SURGICAL HISTORY:   Reviewed.      CURRENT MEDICATIONS:  Medication list is reviewed.    REVIEW OF SYSTEMS:    CHEST/RESPIRATORY:  The patient is not complaining of shortness of breath.        CARDIAC:  No chest pain.   GI:  No abdominal pain.   No diarrhea.     MUSCULOSKELETAL:  She is complaining of severe pain in the right shoulder.  She has not complained of any other joint problems.  States the shoulder is less painful when she is in bed, but really very painful when she attempts to use it.    PHYSICAL EXAMINATION:   GENERAL APPEARANCE:  The patient is not in any distress.         CHEST/RESPIRATORY:  Clear air entry bilaterally.    CARDIOVASCULAR:   CARDIAC:  I  do not hear any murmurs, although she is listed as having MR and TR.  Her JVP is not visible with her sitting upright.      GASTROINTESTINAL:   ABDOMEN:  No masses.     LIVER/SPLEEN/KIDNEYS:  No liver, no spleen.   MUSCULOSKELETAL:   EXTREMITIES:   RIGHT UPPER EXTREMITY:  She appears to have an effusion on the right shoulder.   There has been surgery here.  There is no bruising.  She cannot flex or abduct the shoulder at all.   Palpation anteriorly is very tender.     ASSESSMENT/PLAN:                     Probable rotator cuff tear on the right shoulder.   She cannot move this at all.  There is an effusion.  I think this is probably worthwhile sending her to Orthopedics.    Combined systolic and diastolic heart failure.  She looks remarkably stable here.  She is now on Demadex.  Her BUN and creatinine last checked earlier this month were 32 and 0.86, respectively.     CPT CODE: 16109

## 2015-09-11 ENCOUNTER — Encounter (HOSPITAL_COMMUNITY): Payer: Self-pay | Admitting: Emergency Medicine

## 2015-09-11 ENCOUNTER — Emergency Department (HOSPITAL_COMMUNITY)
Admission: EM | Admit: 2015-09-11 | Discharge: 2015-09-11 | Disposition: A | Discharge status: Skilled Nursing Facility | Payer: Medicare Other | Attending: Emergency Medicine | Admitting: Emergency Medicine

## 2015-09-11 ENCOUNTER — Emergency Department (HOSPITAL_COMMUNITY): Payer: Medicare Other

## 2015-09-11 DIAGNOSIS — S0990XA Unspecified injury of head, initial encounter: Secondary | ICD-10-CM | POA: Diagnosis not present

## 2015-09-11 DIAGNOSIS — Y9389 Activity, other specified: Secondary | ICD-10-CM | POA: Diagnosis not present

## 2015-09-11 DIAGNOSIS — Z8781 Personal history of (healed) traumatic fracture: Secondary | ICD-10-CM | POA: Diagnosis not present

## 2015-09-11 DIAGNOSIS — W19XXXA Unspecified fall, initial encounter: Secondary | ICD-10-CM

## 2015-09-11 DIAGNOSIS — Y9289 Other specified places as the place of occurrence of the external cause: Secondary | ICD-10-CM | POA: Insufficient documentation

## 2015-09-11 DIAGNOSIS — Z8669 Personal history of other diseases of the nervous system and sense organs: Secondary | ICD-10-CM | POA: Insufficient documentation

## 2015-09-11 DIAGNOSIS — S0181XA Laceration without foreign body of other part of head, initial encounter: Secondary | ICD-10-CM | POA: Insufficient documentation

## 2015-09-11 DIAGNOSIS — Z85828 Personal history of other malignant neoplasm of skin: Secondary | ICD-10-CM | POA: Diagnosis not present

## 2015-09-11 DIAGNOSIS — E039 Hypothyroidism, unspecified: Secondary | ICD-10-CM | POA: Diagnosis not present

## 2015-09-11 DIAGNOSIS — T148XXA Other injury of unspecified body region, initial encounter: Secondary | ICD-10-CM

## 2015-09-11 DIAGNOSIS — I4891 Unspecified atrial fibrillation: Secondary | ICD-10-CM | POA: Insufficient documentation

## 2015-09-11 DIAGNOSIS — M1990 Unspecified osteoarthritis, unspecified site: Secondary | ICD-10-CM | POA: Diagnosis not present

## 2015-09-11 DIAGNOSIS — Z7901 Long term (current) use of anticoagulants: Secondary | ICD-10-CM | POA: Insufficient documentation

## 2015-09-11 DIAGNOSIS — F329 Major depressive disorder, single episode, unspecified: Secondary | ICD-10-CM | POA: Insufficient documentation

## 2015-09-11 DIAGNOSIS — S60512A Abrasion of left hand, initial encounter: Secondary | ICD-10-CM | POA: Diagnosis not present

## 2015-09-11 DIAGNOSIS — I5042 Chronic combined systolic (congestive) and diastolic (congestive) heart failure: Secondary | ICD-10-CM | POA: Diagnosis not present

## 2015-09-11 DIAGNOSIS — Z79899 Other long term (current) drug therapy: Secondary | ICD-10-CM | POA: Diagnosis not present

## 2015-09-11 DIAGNOSIS — S61412A Laceration without foreign body of left hand, initial encounter: Secondary | ICD-10-CM | POA: Insufficient documentation

## 2015-09-11 DIAGNOSIS — Y998 Other external cause status: Secondary | ICD-10-CM | POA: Insufficient documentation

## 2015-09-11 DIAGNOSIS — S6991XA Unspecified injury of right wrist, hand and finger(s), initial encounter: Secondary | ICD-10-CM | POA: Insufficient documentation

## 2015-09-11 DIAGNOSIS — W050XXA Fall from non-moving wheelchair, initial encounter: Secondary | ICD-10-CM | POA: Insufficient documentation

## 2015-09-11 DIAGNOSIS — I10 Essential (primary) hypertension: Secondary | ICD-10-CM | POA: Diagnosis not present

## 2015-09-11 MED ORDER — LIDOCAINE HCL (PF) 1 % IJ SOLN
10.0000 mL | Freq: Once | INTRAMUSCULAR | Status: AC
  Administered 2015-09-11: 10 mL via INTRADERMAL
  Filled 2015-09-11: qty 10

## 2015-09-11 NOTE — Discharge Instructions (Signed)
Suture removal in 5-7 days. Keep wounds dry for the next 24 hours. Skin tear to the left cheek and left hand with general wound care. X-rays of the hips and pelvis right knee and right hand without any bony injuries. CT scan of head and neck without evidence of any brain or bony injury.

## 2015-09-11 NOTE — ED Provider Notes (Signed)
   This is a shared visit note.  I was asked by the attending physician to repair the patient's lacerations.  This was my only involvement in this patient's care.      LACERATION REPAIR #1 Performed by: Lorianna Spadaccini L. Authorized by: Hale Bogus Consent: Verbal consent obtained. Risks and benefits: risks, benefits and alternatives were discussed Consent given by: patient Patient identity confirmed: provided demographic data Prepped and Draped in normal sterile fashion Wound explored  Laceration Location: middle forehead  Laceration Length: 3 cm, stellate pattern  No Foreign Bodies seen or palpated  Anesthesia: local infiltration  Local anesthetic: lidocaine 1 % w/o epinephrine  Anesthetic total: 3 ml  Irrigation method: syringe Amount of cleaning: standard  Skin closure: 5-0 vicryl, 5-0 ethilon  Number of sutures: 2 and 6 respectively  Technique: simple interrupted  Patient tolerance: Patient tolerated the procedure well with no immediate complications.    LACERATION REPAIR #2 Performed by: Kharter Brew L. Authorized by: Hale Bogus Consent: Verbal consent obtained. Risks and benefits: risks, benefits and alternatives were discussed Consent given by: patient Patient identity confirmed: provided demographic data Prepped and Draped in normal sterile fashion Wound explored  Laceration Location: left forehead  Laceration Length: 3 cm  No Foreign Bodies seen or palpated  Anesthesia: local infiltration  Local anesthetic: lidocaine 1 % w/o epinephrine  Anesthetic total: 2 ml  Irrigation method: syringe Amount of cleaning: standard  Skin closure: 5-0 ethilon  Number of sutures: 5  Technique: simple interrupted  Patient tolerance: Patient tolerated the procedure well with no immediate complications.    Wound edges well approximated, bleeding controlled.  Bandages applied.  Wound care instructions given   Kem Parkinson,  PA-C 09/12/15 0125  Fredia Sorrow, MD 09/17/15 [phone]
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
## 2015-09-11 NOTE — ED Notes (Signed)
Report given to nurse at [facility]. States they will be here to pick up the patient around [place].

## 2015-09-11 NOTE — ED Notes (Signed)
Erica [name] at bedside.

## 2015-09-11 NOTE — ED Notes (Signed)
Fall at Dr. Brooke Bonito office.  witnesses says she missed a step.  Laceration to head.  Abrasions to left hand and left cheek.

## 2015-10-13 DIAGNOSIS — R278 Other lack of coordination: Secondary | ICD-10-CM | POA: Diagnosis not present

## 2015-10-13 DIAGNOSIS — M25511 Pain in right shoulder: Secondary | ICD-10-CM | POA: Diagnosis not present

## 2015-10-13 DIAGNOSIS — M19011 Primary osteoarthritis, right shoulder: Secondary | ICD-10-CM | POA: Diagnosis not present

## 2015-10-13 DIAGNOSIS — M6281 Muscle weakness (generalized): Secondary | ICD-10-CM | POA: Diagnosis not present

## 2015-10-14 DIAGNOSIS — R278 Other lack of coordination: Secondary | ICD-10-CM | POA: Diagnosis not present

## 2015-10-14 DIAGNOSIS — M6281 Muscle weakness (generalized): Secondary | ICD-10-CM | POA: Diagnosis not present

## 2015-10-14 DIAGNOSIS — M25511 Pain in right shoulder: Secondary | ICD-10-CM | POA: Diagnosis not present

## 2015-10-14 DIAGNOSIS — M19011 Primary osteoarthritis, right shoulder: Secondary | ICD-10-CM | POA: Diagnosis not present

## 2015-10-15 ENCOUNTER — Encounter (HOSPITAL_COMMUNITY)
Admission: AD | Admit: 2015-10-15 | Discharge: 2015-10-15 | Disposition: A | Discharge status: Home / Self Care | Payer: Medicare Other | Source: Skilled Nursing Facility | Attending: Internal Medicine | Admitting: Internal Medicine

## 2015-10-15 DIAGNOSIS — R278 Other lack of coordination: Secondary | ICD-10-CM | POA: Diagnosis not present

## 2015-10-15 DIAGNOSIS — M19011 Primary osteoarthritis, right shoulder: Secondary | ICD-10-CM | POA: Diagnosis not present

## 2015-10-15 DIAGNOSIS — M25511 Pain in right shoulder: Secondary | ICD-10-CM | POA: Diagnosis not present

## 2015-10-15 DIAGNOSIS — I270 Primary pulmonary hypertension: Secondary | ICD-10-CM | POA: Diagnosis not present

## 2015-10-15 DIAGNOSIS — E039 Hypothyroidism, unspecified: Secondary | ICD-10-CM | POA: Diagnosis not present

## 2015-10-15 DIAGNOSIS — M6281 Muscle weakness (generalized): Secondary | ICD-10-CM | POA: Diagnosis not present

## 2015-10-15 DIAGNOSIS — I5042 Chronic combined systolic (congestive) and diastolic (congestive) heart failure: Secondary | ICD-10-CM | POA: Insufficient documentation

## 2015-10-15 LAB — BASIC METABOLIC PANEL
ANION GAP: 8 (ref 5–15)
BUN: 24 mg/dL — AB (ref 6–20)
CALCIUM: 8.5 mg/dL — AB (ref 8.9–10.3)
CO2: 30 mmol/L (ref 22–32)
CREATININE: 0.77 mg/dL (ref 0.44–1.00)
Chloride: 102 mmol/L (ref 101–111)
GFR calc Af Amer: >60 mL/min (ref >60)
GLUCOSE: 98 mg/dL (ref 65–99)
Potassium: 3.6 mmol/L (ref 3.5–5.1)
Sodium: 140 mmol/L (ref 135–145)

## 2015-10-15 LAB — CBC WITH DIFFERENTIAL/PLATELET
Basophils Absolute: 0 10*3/uL (ref 0.0–0.1)
Basophils Relative: 0 %
EOS PCT: 5 %
Eosinophils Absolute: 0.2 10*3/uL (ref 0.0–0.7)
HEMATOCRIT: 34.2 % — AB (ref 36.0–46.0)
Hemoglobin: 11.1 g/dL — ABNORMAL LOW (ref 12.0–15.0)
LYMPHS ABS: 1.4 10*3/uL (ref 0.7–4.0)
Lymphocytes Relative: 35 %
MCH: 31.7 pg (ref 26.0–34.0)
MCHC: 32.5 g/dL (ref 30.0–36.0)
MCV: 97.7 fL (ref 78.0–100.0)
MONO ABS: 0.7 10*3/uL (ref 0.1–1.0)
MONOS PCT: 17 %
Neutro Abs: 1.7 10*3/uL (ref 1.7–7.7)
Neutrophils Relative %: 43 %
PLATELETS: 147 10*3/uL — AB (ref 150–400)
RBC: 3.5 MIL/uL — ABNORMAL LOW (ref 3.87–5.11)
RDW: 13.4 % (ref 11.5–15.5)
WBC: 4 10*3/uL (ref 4.0–10.5)

## 2015-10-15 LAB — TSH: TSH: 0.173 u[IU]/mL — ABNORMAL LOW (ref 0.350–4.500)

## 2015-10-16 DIAGNOSIS — M25511 Pain in right shoulder: Secondary | ICD-10-CM | POA: Diagnosis not present

## 2015-10-16 DIAGNOSIS — R278 Other lack of coordination: Secondary | ICD-10-CM | POA: Diagnosis not present

## 2015-10-16 DIAGNOSIS — M6281 Muscle weakness (generalized): Secondary | ICD-10-CM | POA: Diagnosis not present

## 2015-10-16 DIAGNOSIS — M19011 Primary osteoarthritis, right shoulder: Secondary | ICD-10-CM | POA: Diagnosis not present

## 2015-10-17 DIAGNOSIS — M25511 Pain in right shoulder: Secondary | ICD-10-CM | POA: Diagnosis not present

## 2015-10-17 DIAGNOSIS — M19011 Primary osteoarthritis, right shoulder: Secondary | ICD-10-CM | POA: Diagnosis not present

## 2015-10-17 DIAGNOSIS — M6281 Muscle weakness (generalized): Secondary | ICD-10-CM | POA: Diagnosis not present

## 2015-10-17 DIAGNOSIS — R278 Other lack of coordination: Secondary | ICD-10-CM | POA: Diagnosis not present

## 2015-10-20 DIAGNOSIS — M19011 Primary osteoarthritis, right shoulder: Secondary | ICD-10-CM | POA: Diagnosis not present

## 2015-10-20 DIAGNOSIS — M25511 Pain in right shoulder: Secondary | ICD-10-CM | POA: Diagnosis not present

## 2015-10-20 DIAGNOSIS — R278 Other lack of coordination: Secondary | ICD-10-CM | POA: Diagnosis not present

## 2015-10-20 DIAGNOSIS — M6281 Muscle weakness (generalized): Secondary | ICD-10-CM | POA: Diagnosis not present

## 2015-10-21 DIAGNOSIS — M25511 Pain in right shoulder: Secondary | ICD-10-CM | POA: Diagnosis not present

## 2015-10-21 DIAGNOSIS — R278 Other lack of coordination: Secondary | ICD-10-CM | POA: Diagnosis not present

## 2015-10-21 DIAGNOSIS — M6281 Muscle weakness (generalized): Secondary | ICD-10-CM | POA: Diagnosis not present

## 2015-10-21 DIAGNOSIS — M19011 Primary osteoarthritis, right shoulder: Secondary | ICD-10-CM | POA: Diagnosis not present

## 2015-10-22 DIAGNOSIS — M25511 Pain in right shoulder: Secondary | ICD-10-CM | POA: Diagnosis not present

## 2015-10-22 DIAGNOSIS — M6281 Muscle weakness (generalized): Secondary | ICD-10-CM | POA: Diagnosis not present

## 2015-10-22 DIAGNOSIS — I509 Heart failure, unspecified: Secondary | ICD-10-CM | POA: Diagnosis not present

## 2015-10-22 DIAGNOSIS — E079 Disorder of thyroid, unspecified: Secondary | ICD-10-CM | POA: Diagnosis not present

## 2015-10-22 DIAGNOSIS — M19011 Primary osteoarthritis, right shoulder: Secondary | ICD-10-CM | POA: Diagnosis not present

## 2015-10-22 DIAGNOSIS — R278 Other lack of coordination: Secondary | ICD-10-CM | POA: Diagnosis not present

## 2015-10-23 DIAGNOSIS — R278 Other lack of coordination: Secondary | ICD-10-CM | POA: Diagnosis not present

## 2015-10-23 DIAGNOSIS — M19011 Primary osteoarthritis, right shoulder: Secondary | ICD-10-CM | POA: Diagnosis not present

## 2015-10-23 DIAGNOSIS — M25511 Pain in right shoulder: Secondary | ICD-10-CM | POA: Diagnosis not present

## 2015-10-23 DIAGNOSIS — M6281 Muscle weakness (generalized): Secondary | ICD-10-CM | POA: Diagnosis not present

## 2015-10-24 DIAGNOSIS — M6281 Muscle weakness (generalized): Secondary | ICD-10-CM | POA: Diagnosis not present

## 2015-10-24 DIAGNOSIS — M25511 Pain in right shoulder: Secondary | ICD-10-CM | POA: Diagnosis not present

## 2015-10-24 DIAGNOSIS — M19011 Primary osteoarthritis, right shoulder: Secondary | ICD-10-CM | POA: Diagnosis not present

## 2015-10-24 DIAGNOSIS — R278 Other lack of coordination: Secondary | ICD-10-CM | POA: Diagnosis not present

## 2015-10-27 DIAGNOSIS — R278 Other lack of coordination: Secondary | ICD-10-CM | POA: Diagnosis not present

## 2015-10-27 DIAGNOSIS — M6281 Muscle weakness (generalized): Secondary | ICD-10-CM | POA: Diagnosis not present

## 2015-10-27 DIAGNOSIS — M19011 Primary osteoarthritis, right shoulder: Secondary | ICD-10-CM | POA: Diagnosis not present

## 2015-10-27 DIAGNOSIS — M25511 Pain in right shoulder: Secondary | ICD-10-CM | POA: Diagnosis not present

## 2015-10-28 DIAGNOSIS — M25511 Pain in right shoulder: Secondary | ICD-10-CM | POA: Diagnosis not present

## 2015-10-28 DIAGNOSIS — R278 Other lack of coordination: Secondary | ICD-10-CM | POA: Diagnosis not present

## 2015-10-28 DIAGNOSIS — M19011 Primary osteoarthritis, right shoulder: Secondary | ICD-10-CM | POA: Diagnosis not present

## 2015-10-28 DIAGNOSIS — M6281 Muscle weakness (generalized): Secondary | ICD-10-CM | POA: Diagnosis not present

## 2015-10-29 DIAGNOSIS — M19011 Primary osteoarthritis, right shoulder: Secondary | ICD-10-CM | POA: Diagnosis not present

## 2015-10-29 DIAGNOSIS — R278 Other lack of coordination: Secondary | ICD-10-CM | POA: Diagnosis not present

## 2015-10-29 DIAGNOSIS — M6281 Muscle weakness (generalized): Secondary | ICD-10-CM | POA: Diagnosis not present

## 2015-10-29 DIAGNOSIS — M25511 Pain in right shoulder: Secondary | ICD-10-CM | POA: Diagnosis not present

## 2015-10-30 DIAGNOSIS — M6281 Muscle weakness (generalized): Secondary | ICD-10-CM | POA: Diagnosis not present

## 2015-10-30 DIAGNOSIS — M25511 Pain in right shoulder: Secondary | ICD-10-CM | POA: Diagnosis not present

## 2015-10-30 DIAGNOSIS — R278 Other lack of coordination: Secondary | ICD-10-CM | POA: Diagnosis not present

## 2015-10-30 DIAGNOSIS — M19011 Primary osteoarthritis, right shoulder: Secondary | ICD-10-CM | POA: Diagnosis not present

## 2015-10-31 DIAGNOSIS — M25511 Pain in right shoulder: Secondary | ICD-10-CM | POA: Diagnosis not present

## 2015-10-31 DIAGNOSIS — R278 Other lack of coordination: Secondary | ICD-10-CM | POA: Diagnosis not present

## 2015-10-31 DIAGNOSIS — M19011 Primary osteoarthritis, right shoulder: Secondary | ICD-10-CM | POA: Diagnosis not present

## 2015-10-31 DIAGNOSIS — M6281 Muscle weakness (generalized): Secondary | ICD-10-CM | POA: Diagnosis not present

## 2015-11-03 DIAGNOSIS — R278 Other lack of coordination: Secondary | ICD-10-CM | POA: Diagnosis not present

## 2015-11-03 DIAGNOSIS — M6281 Muscle weakness (generalized): Secondary | ICD-10-CM | POA: Diagnosis not present

## 2015-11-03 DIAGNOSIS — M79671 Pain in right foot: Secondary | ICD-10-CM | POA: Diagnosis not present

## 2015-11-03 DIAGNOSIS — I739 Peripheral vascular disease, unspecified: Secondary | ICD-10-CM | POA: Diagnosis not present

## 2015-11-03 DIAGNOSIS — M25511 Pain in right shoulder: Secondary | ICD-10-CM | POA: Diagnosis not present

## 2015-11-03 DIAGNOSIS — M19011 Primary osteoarthritis, right shoulder: Secondary | ICD-10-CM | POA: Diagnosis not present

## 2015-11-03 DIAGNOSIS — M79672 Pain in left foot: Secondary | ICD-10-CM | POA: Diagnosis not present

## 2015-11-03 DIAGNOSIS — B351 Tinea unguium: Secondary | ICD-10-CM | POA: Diagnosis not present

## 2015-11-04 DIAGNOSIS — M25511 Pain in right shoulder: Secondary | ICD-10-CM | POA: Diagnosis not present

## 2015-11-04 DIAGNOSIS — M19011 Primary osteoarthritis, right shoulder: Secondary | ICD-10-CM | POA: Diagnosis not present

## 2015-11-04 DIAGNOSIS — M6281 Muscle weakness (generalized): Secondary | ICD-10-CM | POA: Diagnosis not present

## 2015-11-04 DIAGNOSIS — R278 Other lack of coordination: Secondary | ICD-10-CM | POA: Diagnosis not present

## 2015-11-05 DIAGNOSIS — M25511 Pain in right shoulder: Secondary | ICD-10-CM | POA: Diagnosis not present

## 2015-11-05 DIAGNOSIS — M19011 Primary osteoarthritis, right shoulder: Secondary | ICD-10-CM | POA: Diagnosis not present

## 2015-11-05 DIAGNOSIS — M6281 Muscle weakness (generalized): Secondary | ICD-10-CM | POA: Diagnosis not present

## 2015-11-05 DIAGNOSIS — R278 Other lack of coordination: Secondary | ICD-10-CM | POA: Diagnosis not present

## 2015-11-06 DIAGNOSIS — R278 Other lack of coordination: Secondary | ICD-10-CM | POA: Diagnosis not present

## 2015-11-06 DIAGNOSIS — M6281 Muscle weakness (generalized): Secondary | ICD-10-CM | POA: Diagnosis not present

## 2015-11-06 DIAGNOSIS — M19011 Primary osteoarthritis, right shoulder: Secondary | ICD-10-CM | POA: Diagnosis not present

## 2015-11-06 DIAGNOSIS — M25511 Pain in right shoulder: Secondary | ICD-10-CM | POA: Diagnosis not present

## 2015-11-07 DIAGNOSIS — M19011 Primary osteoarthritis, right shoulder: Secondary | ICD-10-CM | POA: Diagnosis not present

## 2015-11-07 DIAGNOSIS — M6281 Muscle weakness (generalized): Secondary | ICD-10-CM | POA: Diagnosis not present

## 2015-11-07 DIAGNOSIS — R278 Other lack of coordination: Secondary | ICD-10-CM | POA: Diagnosis not present

## 2015-11-07 DIAGNOSIS — M25511 Pain in right shoulder: Secondary | ICD-10-CM | POA: Diagnosis not present

## 2015-11-10 DIAGNOSIS — M19011 Primary osteoarthritis, right shoulder: Secondary | ICD-10-CM | POA: Diagnosis not present

## 2015-11-10 DIAGNOSIS — M25511 Pain in right shoulder: Secondary | ICD-10-CM | POA: Diagnosis not present

## 2015-11-10 DIAGNOSIS — R278 Other lack of coordination: Secondary | ICD-10-CM | POA: Diagnosis not present

## 2015-11-10 DIAGNOSIS — M6281 Muscle weakness (generalized): Secondary | ICD-10-CM | POA: Diagnosis not present

## 2015-11-11 DIAGNOSIS — M6281 Muscle weakness (generalized): Secondary | ICD-10-CM | POA: Diagnosis not present

## 2015-11-11 DIAGNOSIS — R278 Other lack of coordination: Secondary | ICD-10-CM | POA: Diagnosis not present

## 2015-11-11 DIAGNOSIS — F419 Anxiety disorder, unspecified: Secondary | ICD-10-CM | POA: Diagnosis not present

## 2015-11-11 DIAGNOSIS — M19011 Primary osteoarthritis, right shoulder: Secondary | ICD-10-CM | POA: Diagnosis not present

## 2015-11-11 DIAGNOSIS — M25511 Pain in right shoulder: Secondary | ICD-10-CM | POA: Diagnosis not present

## 2015-11-11 DIAGNOSIS — F329 Major depressive disorder, single episode, unspecified: Secondary | ICD-10-CM | POA: Diagnosis not present

## 2015-11-12 DIAGNOSIS — M6281 Muscle weakness (generalized): Secondary | ICD-10-CM | POA: Diagnosis not present

## 2015-11-12 DIAGNOSIS — R278 Other lack of coordination: Secondary | ICD-10-CM | POA: Diagnosis not present

## 2015-11-12 DIAGNOSIS — M25511 Pain in right shoulder: Secondary | ICD-10-CM | POA: Diagnosis not present

## 2015-11-12 DIAGNOSIS — M19011 Primary osteoarthritis, right shoulder: Secondary | ICD-10-CM | POA: Diagnosis not present

## 2015-11-13 DIAGNOSIS — M25511 Pain in right shoulder: Secondary | ICD-10-CM | POA: Diagnosis not present

## 2015-11-13 DIAGNOSIS — M19011 Primary osteoarthritis, right shoulder: Secondary | ICD-10-CM | POA: Diagnosis not present

## 2015-11-13 DIAGNOSIS — R278 Other lack of coordination: Secondary | ICD-10-CM | POA: Diagnosis not present

## 2015-11-13 DIAGNOSIS — M6281 Muscle weakness (generalized): Secondary | ICD-10-CM | POA: Diagnosis not present

## 2015-11-14 DIAGNOSIS — M6281 Muscle weakness (generalized): Secondary | ICD-10-CM | POA: Diagnosis not present

## 2015-11-14 DIAGNOSIS — R278 Other lack of coordination: Secondary | ICD-10-CM | POA: Diagnosis not present

## 2015-11-14 DIAGNOSIS — M25511 Pain in right shoulder: Secondary | ICD-10-CM | POA: Diagnosis not present

## 2015-11-14 DIAGNOSIS — M19011 Primary osteoarthritis, right shoulder: Secondary | ICD-10-CM | POA: Diagnosis not present

## 2015-11-17 DIAGNOSIS — R278 Other lack of coordination: Secondary | ICD-10-CM | POA: Diagnosis not present

## 2015-11-17 DIAGNOSIS — M19011 Primary osteoarthritis, right shoulder: Secondary | ICD-10-CM | POA: Diagnosis not present

## 2015-11-17 DIAGNOSIS — M25511 Pain in right shoulder: Secondary | ICD-10-CM | POA: Diagnosis not present

## 2015-11-17 DIAGNOSIS — M6281 Muscle weakness (generalized): Secondary | ICD-10-CM | POA: Diagnosis not present

## 2015-11-18 DIAGNOSIS — R278 Other lack of coordination: Secondary | ICD-10-CM | POA: Diagnosis not present

## 2015-11-18 DIAGNOSIS — M19011 Primary osteoarthritis, right shoulder: Secondary | ICD-10-CM | POA: Diagnosis not present

## 2015-11-18 DIAGNOSIS — M6281 Muscle weakness (generalized): Secondary | ICD-10-CM | POA: Diagnosis not present

## 2015-11-18 DIAGNOSIS — M25511 Pain in right shoulder: Secondary | ICD-10-CM | POA: Diagnosis not present

## 2015-11-19 DIAGNOSIS — M19011 Primary osteoarthritis, right shoulder: Secondary | ICD-10-CM | POA: Diagnosis not present

## 2015-11-19 DIAGNOSIS — R278 Other lack of coordination: Secondary | ICD-10-CM | POA: Diagnosis not present

## 2015-11-19 DIAGNOSIS — M6281 Muscle weakness (generalized): Secondary | ICD-10-CM | POA: Diagnosis not present

## 2015-11-19 DIAGNOSIS — M25511 Pain in right shoulder: Secondary | ICD-10-CM | POA: Diagnosis not present

## 2015-11-20 DIAGNOSIS — R278 Other lack of coordination: Secondary | ICD-10-CM | POA: Diagnosis not present

## 2015-11-20 DIAGNOSIS — M25511 Pain in right shoulder: Secondary | ICD-10-CM | POA: Diagnosis not present

## 2015-11-20 DIAGNOSIS — M19011 Primary osteoarthritis, right shoulder: Secondary | ICD-10-CM | POA: Diagnosis not present

## 2015-11-20 DIAGNOSIS — M6281 Muscle weakness (generalized): Secondary | ICD-10-CM | POA: Diagnosis not present

## 2015-11-21 DIAGNOSIS — M19011 Primary osteoarthritis, right shoulder: Secondary | ICD-10-CM | POA: Diagnosis not present

## 2015-11-21 DIAGNOSIS — M25511 Pain in right shoulder: Secondary | ICD-10-CM | POA: Diagnosis not present

## 2015-11-21 DIAGNOSIS — M6281 Muscle weakness (generalized): Secondary | ICD-10-CM | POA: Diagnosis not present

## 2015-11-21 DIAGNOSIS — R278 Other lack of coordination: Secondary | ICD-10-CM | POA: Diagnosis not present

## 2015-11-24 DIAGNOSIS — M6281 Muscle weakness (generalized): Secondary | ICD-10-CM | POA: Diagnosis not present

## 2015-11-24 DIAGNOSIS — R278 Other lack of coordination: Secondary | ICD-10-CM | POA: Diagnosis not present

## 2015-11-24 DIAGNOSIS — M19011 Primary osteoarthritis, right shoulder: Secondary | ICD-10-CM | POA: Diagnosis not present

## 2015-11-24 DIAGNOSIS — M25511 Pain in right shoulder: Secondary | ICD-10-CM | POA: Diagnosis not present

## 2015-11-27 ENCOUNTER — Inpatient Hospital Stay
Admission: RE | Admit: 2015-11-27 | Discharge: 2017-08-13 | Disposition: A | Discharge status: Nursing Home (Includes ICF) | Payer: Medicare Other | Source: Ambulatory Visit | Attending: Internal Medicine | Admitting: Internal Medicine

## 2015-11-27 DIAGNOSIS — T148XXA Other injury of unspecified body region, initial encounter: Principal | ICD-10-CM

## 2015-11-27 DIAGNOSIS — Z8781 Personal history of (healed) traumatic fracture: Secondary | ICD-10-CM

## 2015-11-27 DIAGNOSIS — R52 Pain, unspecified: Secondary | ICD-10-CM

## 2015-11-27 DIAGNOSIS — W19XXXA Unspecified fall, initial encounter: Secondary | ICD-10-CM

## 2016-01-16 ENCOUNTER — Non-Acute Institutional Stay (SKILLED_NURSING_FACILITY): Payer: Medicare Other | Admitting: Internal Medicine

## 2016-01-16 ENCOUNTER — Encounter: Payer: Self-pay | Admitting: Internal Medicine

## 2016-01-16 DIAGNOSIS — I509 Heart failure, unspecified: Secondary | ICD-10-CM

## 2016-01-16 DIAGNOSIS — E038 Other specified hypothyroidism: Secondary | ICD-10-CM | POA: Diagnosis not present

## 2016-01-16 DIAGNOSIS — G40909 Epilepsy, unspecified, not intractable, without status epilepticus: Secondary | ICD-10-CM

## 2016-01-16 DIAGNOSIS — I4891 Unspecified atrial fibrillation: Secondary | ICD-10-CM | POA: Diagnosis not present
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
## 2016-01-17 ENCOUNTER — Encounter (HOSPITAL_COMMUNITY)
Admission: RE | Admit: 2016-01-17 | Discharge: 2016-01-17 | Disposition: A | Discharge status: Home / Self Care | Payer: Medicare Other | Source: Skilled Nursing Facility | Attending: Internal Medicine | Admitting: Internal Medicine

## 2016-01-17 DIAGNOSIS — E039 Hypothyroidism, unspecified: Secondary | ICD-10-CM | POA: Diagnosis not present

## 2016-01-17 DIAGNOSIS — R569 Unspecified convulsions: Secondary | ICD-10-CM | POA: Insufficient documentation

## 2016-01-17 DIAGNOSIS — M159 Polyosteoarthritis, unspecified: Secondary | ICD-10-CM | POA: Diagnosis not present

## 2016-01-17 DIAGNOSIS — I5042 Chronic combined systolic (congestive) and diastolic (congestive) heart failure: Secondary | ICD-10-CM | POA: Diagnosis not present

## 2016-01-17 LAB — CBC WITH DIFFERENTIAL/PLATELET
BASOS PCT: 1 %
Basophils Absolute: 0 10*3/uL (ref 0.0–0.1)
EOS ABS: 0.3 10*3/uL (ref 0.0–0.7)
EOS PCT: 6 %
HCT: 35.1 % — ABNORMAL LOW (ref 36.0–46.0)
Hemoglobin: 11.7 g/dL — ABNORMAL LOW (ref 12.0–15.0)
LYMPHS ABS: 1.5 10*3/uL (ref 0.7–4.0)
Lymphocytes Relative: 34 %
MCH: 32.5 pg (ref 26.0–34.0)
MCHC: 33.3 g/dL (ref 30.0–36.0)
MCV: 97.5 fL (ref 78.0–100.0)
Monocytes Absolute: 0.5 10*3/uL (ref 0.1–1.0)
Monocytes Relative: 11 %
Neutro Abs: 2.2 10*3/uL (ref 1.7–7.7)
Neutrophils Relative %: 48 %
PLATELETS: 151 10*3/uL (ref 150–400)
RBC: 3.6 MIL/uL — AB (ref 3.87–5.11)
RDW: 13.8 % (ref 11.5–15.5)
WBC: 4.5 10*3/uL (ref 4.0–10.5)

## 2016-01-17 LAB — PHENYTOIN LEVEL, TOTAL: PHENYTOIN LVL: 8 ug/mL — AB (ref 10.0–20.0)

## 2016-01-17 LAB — TSH: TSH: 2.279 u[IU]/mL (ref 0.350–4.500)

## 2016-01-17 NOTE — Progress Notes (Signed)
Patient ID: Erica [name], female   DOB: 07/19/1917, 82 y.o.   MRN: [id number]

## 2016-01-19 ENCOUNTER — Encounter (HOSPITAL_COMMUNITY)
Admission: RE | Admit: 2016-01-19 | Discharge: 2016-01-19 | Disposition: A | Discharge status: Home / Self Care | Payer: Medicare Other | Source: Skilled Nursing Facility | Attending: *Deleted | Admitting: *Deleted

## 2016-01-19 DIAGNOSIS — E039 Hypothyroidism, unspecified: Secondary | ICD-10-CM | POA: Diagnosis not present

## 2016-01-19 DIAGNOSIS — M159 Polyosteoarthritis, unspecified: Secondary | ICD-10-CM | POA: Diagnosis not present

## 2016-01-19 DIAGNOSIS — R569 Unspecified convulsions: Secondary | ICD-10-CM | POA: Diagnosis not present

## 2016-01-19 DIAGNOSIS — I5042 Chronic combined systolic (congestive) and diastolic (congestive) heart failure: Secondary | ICD-10-CM | POA: Diagnosis not present

## 2016-01-19 LAB — COMPREHENSIVE METABOLIC PANEL
ALBUMIN: 3.5 g/dL (ref 3.5–5.0)
ALK PHOS: 139 U/L — AB (ref 38–126)
ALT: 30 U/L (ref 14–54)
ANION GAP: 9 (ref 5–15)
AST: 31 U/L (ref 15–41)
BILIRUBIN TOTAL: 0.5 mg/dL (ref 0.3–1.2)
BUN: 31 mg/dL — ABNORMAL HIGH (ref 6–20)
CALCIUM: 8.8 mg/dL — AB (ref 8.9–10.3)
CO2: 30 mmol/L (ref 22–32)
Chloride: 104 mmol/L (ref 101–111)
Creatinine, Ser: 0.74 mg/dL (ref 0.44–1.00)
GFR calc non Af Amer: >60 mL/min (ref >60)
GLUCOSE: 101 mg/dL — AB (ref 65–99)
POTASSIUM: 3.9 mmol/L (ref 3.5–5.1)
SODIUM: 143 mmol/L (ref 135–145)
TOTAL PROTEIN: 6.2 g/dL — AB (ref 6.5–8.1)

## 2016-02-19 ENCOUNTER — Non-Acute Institutional Stay (SKILLED_NURSING_FACILITY): Payer: Medicare Other | Admitting: Internal Medicine

## 2016-02-19 ENCOUNTER — Encounter: Payer: Self-pay | Admitting: Internal Medicine

## 2016-02-19 DIAGNOSIS — E876 Hypokalemia: Secondary | ICD-10-CM | POA: Diagnosis not present

## 2016-02-19 DIAGNOSIS — E038 Other specified hypothyroidism: Secondary | ICD-10-CM

## 2016-02-19 DIAGNOSIS — E785 Hyperlipidemia, unspecified: Secondary | ICD-10-CM

## 2016-02-19 DIAGNOSIS — I4891 Unspecified atrial fibrillation: Secondary | ICD-10-CM | POA: Diagnosis not present

## 2016-02-19 DIAGNOSIS — G40909 Epilepsy, unspecified, not intractable, without status epilepticus: Secondary | ICD-10-CM

## 2016-02-19 DIAGNOSIS — I38 Endocarditis, valve unspecified: Secondary | ICD-10-CM | POA: Diagnosis not present

## 2016-02-19 DIAGNOSIS — I10 Essential (primary) hypertension: Secondary | ICD-10-CM

## 2016-02-19 NOTE — Assessment & Plan Note (Signed)
Monitor for any postural hypotension symptoms Adjust beta blocker if she has symptomatic hypotension

## 2016-02-19 NOTE — Assessment & Plan Note (Signed)
No change in relatively high dose of L-thyroxine as TSH has been therapeutic. TSH should be maintained at least above 1.00 because of a history of atrial fibrillation with rapid ventricular response

## 2016-02-19 NOTE — Assessment & Plan Note (Signed)
Rate is controlled; no change in beta blocker unless she has symptomatic postural hypotension symptoms

## 2016-02-19 NOTE — Patient Instructions (Addendum)
New orders for Matrix entry. Fasting Cortisol and BMET 02/20/16

## 2016-02-19 NOTE — Assessment & Plan Note (Signed)
Recheck Dilantin level should she have seizures

## 2016-02-19 NOTE — Assessment & Plan Note (Signed)
Fasting cortisol and BMET

## 2016-02-19 NOTE — Progress Notes (Signed)
Patient ID: Erica [name], female   DOB: 05/27/1917, 82 y.o.   MRN: [id number]   Facility Location: [facility] Room Number: 142-W  This is a nursing facility follow up  of chronic medical diagnoses  Interim medical record and care since last [place] visit was updated with review of diagnostic studies and change in clinical status since last visit were documented.  Family history updated  HPI: She has had chronic heart failure issues in the context of atrial fibrillation. She has been hospitalized with rapid ventricular response to A. fib associated with acute pulmonary edema ;acute systolic & diastolic heart failure; and acute respiratory failure with hypoxia in 2015. Her atrial fibrillation rate control has been good; she is on a total of 75 mg of beta blocker daily. She is also on oral anticoagulant Eliquis. On the beta blocker blood pressures range from today's value of 98/53-113/59.  Medication list indicates that she takes 140 mEq of potassium daily. Her most recent labs were 01/19/16.Renal  function was normal. Potassium was 3.9. It has varied from 3.4- 3.9 since 06/30/15.She is on Torsemide 20 g daily. She has also exhibited very mild anemia, normochromic normocytic. She has hypothyroidism; TSH has been in  therapeutic  range on 3 occasions since 11/16 on  a dose of L-thyroxine of 200 g daily. There is a history of seizure disorder; Phenytoin levels have been slightly low to therapeutic.  Comprehensive review of systems: She states that she feels fine and has no symptoms. After extensive questioning; she describes nocturia 1. She also describes some intermittent cough with sputum production. This can be light yellow. There are no associated infectious symptoms otherwise.  Constitutional: No fever,significant weight change, fatigue  Eyes: No redness, discharge, pain, vision change ENT/mouth: No nasal congestion,  purulent discharge, earache,change in  hearing ,sore throat  Cardiovascular: No chest pain, palpitations,paroxysmal nocturnal dyspnea, claudication, edema  Respiratory: No hemoptysis, DOE , significant snoring,apnea  Gastrointestinal: No heartburn,dysphagia,abdominal pain, nausea / vomiting,rectal bleeding, melena,change in bowels Genitourinary: No dysuria,hematuria, pyuria Musculoskeletal: No joint stiffness, joint swelling, weakness,pain Dermatologic: No rash, pruritus, change in appearance of skin Neurologic: No dizziness,headache,syncope, seizures, numbness , tingling Psychiatric: No significant anxiety , depression, insomnia, anorexia Endocrine: No change in hair/skin/ nails, excessive thirst, excessive hunger, excessive urination  Hematologic/lymphatic: No significant bruising, lymphadenopathy,abnormal bleeding Allergy/immunology: No itchy/ watery eyes, significant sneezing, urticaria, angioedema  Physical exam:  Pertinent or positive findings:She appears much younger than her stated age. She is alert but gave the date as May 2000. She knew we had a new president but was unable to provide his name. She stated that she did not like him. She has bilateral ptosis. Heart rate is slow and irregular. Mild, mixed DIP/PIP arthritic changes in the hands. She has scattered keratoses especially over the extremities. Pedal pulses are decreased.  General appearance:Adequately nourished; no acute distress , increased work of breathing is present.   Lymphatic: No lymphadenopathy about the head, neck, axilla . Eyes: No conjunctival inflammation or lid edema is present. There is no scleral icterus. Ears:  External ear exam shows no significant lesions or deformities.   Nose:  External nasal examination shows no deformity or inflammation. Nasal mucosa are pink and moist without lesions ,exudates Oral exam: lips and gums are healthy appearing.There is no oropharyngeal erythema or exudate . Neck:  No thyromegaly, masses, tenderness noted.      Heart:  No gallop, murmur, click, rub .  Lungs:Chest clear to auscultation without wheezes, rhonchi,rales ,  rubs. Abdomen:Bowel sounds are normal. Abdomen is soft and nontender with no organomegaly, hernias,masses. GU: deferred Extremities:  No cyanosis, clubbing,edema  Neurologic exam : Strength equal  in upper & lower extremities Balance,Rhomberg,finger to nose testing could not be completed due to clinical state Deep tendon reflexes are equal but 0-1/2+ @ knees Skin: Warm & dry w/o tenting. No significant lesions or rash.    See summary under each active problem in the Problem List with associated updated therapeutic plan

## 2016-02-20 ENCOUNTER — Encounter (HOSPITAL_COMMUNITY)
Admission: AD | Admit: 2016-02-20 | Discharge: 2016-02-20 | Disposition: A | Discharge status: Home / Self Care | Payer: Medicare Other | Source: Skilled Nursing Facility | Attending: Internal Medicine | Admitting: Internal Medicine

## 2016-02-20 DIAGNOSIS — M159 Polyosteoarthritis, unspecified: Secondary | ICD-10-CM | POA: Diagnosis not present

## 2016-02-20 DIAGNOSIS — I5042 Chronic combined systolic (congestive) and diastolic (congestive) heart failure: Secondary | ICD-10-CM | POA: Insufficient documentation

## 2016-02-20 DIAGNOSIS — R569 Unspecified convulsions: Secondary | ICD-10-CM | POA: Diagnosis not present

## 2016-02-20 DIAGNOSIS — E039 Hypothyroidism, unspecified: Secondary | ICD-10-CM | POA: Diagnosis not present

## 2016-02-20 LAB — BASIC METABOLIC PANEL
Anion gap: 4 — ABNORMAL LOW (ref 5–15)
BUN: 25 mg/dL — AB (ref 6–20)
CO2: 31 mmol/L (ref 22–32)
Calcium: 8.5 mg/dL — ABNORMAL LOW (ref 8.9–10.3)
Chloride: 103 mmol/L (ref 101–111)
Creatinine, Ser: 0.7 mg/dL (ref 0.44–1.00)
Glucose, Bld: 100 mg/dL — ABNORMAL HIGH (ref 65–99)
Potassium: 3.4 mmol/L — ABNORMAL LOW (ref 3.5–5.1)
SODIUM: 138 mmol/L (ref 135–145)

## 2016-02-20 LAB — CORTISOL: CORTISOL PLASMA: 19 ug/dL

## 2016-02-24 ENCOUNTER — Encounter (HOSPITAL_COMMUNITY)
Admission: AD | Admit: 2016-02-24 | Discharge: 2016-02-24 | Disposition: A | Discharge status: Home / Self Care | Payer: Medicare Other | Source: Skilled Nursing Facility | Attending: *Deleted | Admitting: *Deleted

## 2016-02-24 DIAGNOSIS — E039 Hypothyroidism, unspecified: Secondary | ICD-10-CM | POA: Diagnosis not present

## 2016-02-24 DIAGNOSIS — I5042 Chronic combined systolic (congestive) and diastolic (congestive) heart failure: Secondary | ICD-10-CM | POA: Diagnosis not present

## 2016-02-24 DIAGNOSIS — R569 Unspecified convulsions: Secondary | ICD-10-CM | POA: Diagnosis not present

## 2016-02-24 DIAGNOSIS — M159 Polyosteoarthritis, unspecified: Secondary | ICD-10-CM | POA: Diagnosis not present

## 2016-02-27 LAB — ALDOSTERONE + RENIN ACTIVITY W/ RATIO
ALDO / PRA Ratio: 22.5 (ref 0.0–30.0)
Aldosterone: 11.6 ng/dL (ref 0.0–30.0)
PRA LC/MS/MS: 0.515 ng/mL/h (ref 0.167–5.380)

## 2016-03-01 ENCOUNTER — Encounter (HOSPITAL_COMMUNITY)
Admission: RE | Admit: 2016-03-01 | Discharge: 2016-03-01 | Disposition: A | Discharge status: Home / Self Care | Payer: Medicare Other | Source: Skilled Nursing Facility | Attending: *Deleted | Admitting: *Deleted

## 2016-03-01 DIAGNOSIS — R569 Unspecified convulsions: Secondary | ICD-10-CM | POA: Diagnosis not present

## 2016-03-01 DIAGNOSIS — E039 Hypothyroidism, unspecified: Secondary | ICD-10-CM | POA: Diagnosis not present

## 2016-03-01 DIAGNOSIS — I5042 Chronic combined systolic (congestive) and diastolic (congestive) heart failure: Secondary | ICD-10-CM | POA: Diagnosis not present

## 2016-03-01 DIAGNOSIS — M159 Polyosteoarthritis, unspecified: Secondary | ICD-10-CM | POA: Diagnosis not present

## 2016-03-01 LAB — BASIC METABOLIC PANEL
Anion gap: 6 (ref 5–15)
BUN: 27 mg/dL — AB (ref 6–20)
CHLORIDE: 103 mmol/L (ref 101–111)
CO2: 31 mmol/L (ref 22–32)
Calcium: 8.5 mg/dL — ABNORMAL LOW (ref 8.9–10.3)
Creatinine, Ser: 0.82 mg/dL (ref 0.44–1.00)
GFR calc Af Amer: >60 mL/min (ref >60)
GFR calc non Af Amer: 59 mL/min — ABNORMAL LOW (ref >60)
GLUCOSE: 103 mg/dL — AB (ref 65–99)
POTASSIUM: 3.6 mmol/L (ref 3.5–5.1)
Sodium: 140 mmol/L (ref 135–145)

## 2016-03-02 ENCOUNTER — Non-Acute Institutional Stay (SKILLED_NURSING_FACILITY): Payer: Medicare Other | Admitting: Internal Medicine

## 2016-03-02 ENCOUNTER — Encounter: Payer: Self-pay | Admitting: Internal Medicine

## 2016-03-02 DIAGNOSIS — I4891 Unspecified atrial fibrillation: Secondary | ICD-10-CM

## 2016-03-02 DIAGNOSIS — E876 Hypokalemia: Secondary | ICD-10-CM

## 2016-03-02 DIAGNOSIS — I10 Essential (primary) hypertension: Secondary | ICD-10-CM | POA: Diagnosis not present

## 2016-03-02 NOTE — Progress Notes (Signed)
Patient ID: Erica [name], female   DOB: March 23, 1917, 82 y.o.   MRN: [id number]   This is a nursing facility follow up for specific acute issue of possible primary hyperaldosteronism manifested as profound hypokalemia ;HTN and PAC/PRA ratio > 20 with PRA<1.0.  Interim medical record and care since last [place] visit was updated with review of diagnostic studies and change in clinical status since last visit were documented.  HPI: The patient is on 75 mg of metoprolol extended release. To maintain a low normal potassium she is on KCL 20 mEq 4 tablets in the morning and 3 in the evening for a total dose 140 mEq. She is on torsemide 20 mg tid as a diuretic.  Aldosterone / renin studies are listed above. Creatinine has been normal.  Comprehensive review of systems: She denies any significant problems per se; validity of hx is in question as she gave the date as 2014. She describes intermittent nonproductive cough. She has occasional dizziness and describes frequent falls. She describes poor balance or carelessness as cause. She denies any associated neuro or Cardiologic prodrome prior to the falls. Occasionally she'll have edema of the lower extremities.  Constitutional: No fever,significant weight change, fatigue  Eyes: No redness, discharge, pain, vision change ENT/mouth: No nasal congestion,  purulent discharge, earache,change in hearing ,sore throat  Cardiovascular: No chest pain, palpitations,paroxysmal nocturnal dyspnea, claudication  Respiratory: No significany sputum production,hemoptysis, DOE Gastrointestinal: No heartburn,dysphagia,abdominal pain, nausea / vomiting,rectal bleeding, melena,change in bowels Genitourinary: No dysuria,hematuria, pyuria,  incontinence, nocturia Musculoskeletal: No joint stiffness, joint swelling, weakness,pain Dermatologic: No rash, pruritus, change in appearance of skin Neurologic: No headache,syncope, seizures, numbness ,  tingling Psychiatric: No significant anxiety , depression, insomnia, anorexia Endocrine: No change in hair/skin/ nails, excessive thirst, excessive hunger, excessive urination  Hematologic/lymphatic: No significant bruising, lymphadenopathy,abnormal bleeding Allergy/immunology: No itchy/ watery eyes, significant sneezing, urticaria, angioedema  Physical exam:  Pertinent or positive findings: Rosacea suggested over the [name]. She has scattered keratotic lesions and there is one extremely large lesion in the right external ear , verrucoid in appearance. She states that this was noted by her doctor over a year ago. She has scattered bruising particularly over the dorsum of the hands. There is hyperpigmentation over the shins with many keratotic lesions in this area.  Heart rhythm is irregular but slow. Dorsalis pedis pulses and posterior tibial pulses are decreased. Toenails are thickened and deformed. She has mixed arthritic changes of hands, mainly of the PIP joints. General appearance:Adequately nourished; no acute distress , increased work of breathing is present.   Lymphatic: No lymphadenopathy about the head, neck, axilla . Eyes: No conjunctival inflammation or lid edema is present. There is no scleral icterus. Ears:  External ear exam shows no significant lesions or deformities.   Nose:  External nasal examination shows no deformity or inflammation. Nasal mucosa are pink and moist without lesions ,exudates Oral exam: lips and gums are healthy appearing.There is no oropharyngeal erythema or exudate . Neck:  No thyromegaly, masses, tenderness noted.    Heart:  No gallop, murmur, click, rub .  Lungs:Chest clear to auscultation without wheezes, rhonchi,rales , rubs. Abdomen:Bowel sounds are normal. Abdomen is soft and nontender with no organomegaly, hernias,masses. GU: deferred  Extremities:  No cyanosis, clubbing,edema  Neurologic exam : Strength equal  in upper & lower extremities but  decreased Balance,Rhomberg,finger to nose testing could not be completed due to clinical state Skin: Warm & dry w/o tenting. No significant  rash.  See summary under each active problem in the Problem List with associated updated therapeutic plan

## 2016-03-02 NOTE — Assessment & Plan Note (Signed)
See orders for spironolactone and potassium weaning

## 2016-03-02 NOTE — Assessment & Plan Note (Addendum)
Rate controlled; rate will be monitored as the metoprolol dose is decreased

## 2016-03-02 NOTE — Assessment & Plan Note (Addendum)
See hypokalemia entries; metoprolol will be decreased in dose and Torsemide decreased as spironolactone trial is initiated

## 2016-03-02 NOTE — Patient Instructions (Addendum)
Decrease torsemide to 20 mg twice a day; add spironolactone 25 mg daily. Decrease potassium to 60 mEq twice a day. Decrease metoprolol to 50 mg daily  BMET in am.

## 2016-03-03 ENCOUNTER — Encounter (HOSPITAL_COMMUNITY)
Admission: RE | Admit: 2016-03-03 | Discharge: 2016-03-03 | Disposition: A | Discharge status: Home / Self Care | Payer: Medicare Other | Source: Skilled Nursing Facility | Attending: *Deleted | Admitting: *Deleted

## 2016-03-04 ENCOUNTER — Encounter (HOSPITAL_COMMUNITY)
Admission: RE | Admit: 2016-03-04 | Discharge: 2016-03-04 | Disposition: A | Discharge status: Home / Self Care | Payer: Medicare Other | Source: Skilled Nursing Facility | Attending: *Deleted | Admitting: *Deleted

## 2016-03-04 ENCOUNTER — Non-Acute Institutional Stay (SKILLED_NURSING_FACILITY): Payer: Medicare Other | Admitting: Internal Medicine

## 2016-03-04 ENCOUNTER — Encounter: Payer: Self-pay | Admitting: Internal Medicine

## 2016-03-04 DIAGNOSIS — R569 Unspecified convulsions: Secondary | ICD-10-CM | POA: Diagnosis not present

## 2016-03-04 DIAGNOSIS — I5042 Chronic combined systolic (congestive) and diastolic (congestive) heart failure: Secondary | ICD-10-CM | POA: Diagnosis not present

## 2016-03-04 DIAGNOSIS — I10 Essential (primary) hypertension: Secondary | ICD-10-CM | POA: Diagnosis not present

## 2016-03-04 DIAGNOSIS — E039 Hypothyroidism, unspecified: Secondary | ICD-10-CM | POA: Diagnosis not present

## 2016-03-04 DIAGNOSIS — M159 Polyosteoarthritis, unspecified: Secondary | ICD-10-CM | POA: Diagnosis not present

## 2016-03-04 DIAGNOSIS — E876 Hypokalemia: Secondary | ICD-10-CM

## 2016-03-04 LAB — BASIC METABOLIC PANEL
Anion gap: 7 (ref 5–15)
BUN: 29 mg/dL — AB (ref 6–20)
CHLORIDE: 103 mmol/L (ref 101–111)
CO2: 28 mmol/L (ref 22–32)
Calcium: 8.4 mg/dL — ABNORMAL LOW (ref 8.9–10.3)
Creatinine, Ser: 0.73 mg/dL (ref 0.44–1.00)
Glucose, Bld: 95 mg/dL (ref 65–99)
POTASSIUM: 3.4 mmol/L — AB (ref 3.5–5.1)
SODIUM: 138 mmol/L (ref 135–145)

## 2016-03-04 NOTE — Assessment & Plan Note (Signed)
Potassium is 3.4 today on 120 mEq of potassium. Personal and will be decreased to 20 mg daily and spironolactone increased to 25 mg twice a day. BMET will be rechecked 529. Further reduction in potassium dose will be pursued if possible.

## 2016-03-04 NOTE — Patient Instructions (Signed)
Decrease torsemide to once daily: Increase Spironolactonein to twice a day. No change in potassium dose. Recheck BMET  5/26.

## 2016-03-04 NOTE — Assessment & Plan Note (Signed)
Blood pressure is low and pulse slow on 50 mg of beta blocker. If blood pressure continues to drop; the metoprolol will be decreased to 25 mg daily extended release

## 2016-03-04 NOTE — Progress Notes (Signed)
Patient ID: Erica [name], female   DOB: 1916-12-01, 82 y.o.   MRN: [id number]   Facility Location: [facility] Room Number: 142-W  This is a nursing facility follow up of her cardiovascular status and response to weaning the Torsemide diuretic and initiating and titrating spironolactone. These changes were initiated as she had required extremely high dose potassium supplementation, specifically 140 mEq daily to maintain a normal potassium. This was in the context of torsemide 20 mg 3 times a day. Plasma aldosterone and renin studies suggested the possibility of primary hyperaldosteronism.  She denies any active cardiopulmonary symptoms at this time except chronic stable left pedal edema.   Directed review of systems:Constitutional: No fever,significant weight change, fatigue  Cardiovascular: No chest pain, palpitations,paroxysmal nocturnal dyspnea, claudication  Respiratory: No cough, sputum production,hemoptysis, DOE , significant snoring,apnea   Endocrine: No change in hair/skin/ nails, excessive thirst, excessive hunger, excessive urination    Physical exam:  Pertinent or positive findings: She appears younger than stated age. She exhibits scattered keratotic lesions of the face, trunk, extremities.  There is an erythematous hyperpigmentation i an  irregular distribution of the right cheek. She has a slow irregular heart rate. She does exhibit rhonchi in the left lower lobe > than the right. She has trace edema at the left ankle. She gave the date as 01/30/2016. She could not name the president but knew that she did not like him. General appearance:Adequately nourished; no acute distress , increased work of breathing is present.   Lymphatic: No lymphadenopathy about the head, neck, axilla . Eyes: No conjunctival inflammation or lid edema is present. There is no scleral icterus. Neck:  No thyromegaly, masses, tenderness noted.    Heart:  No gallop, murmur, click, rub .    Lungs: without wheezes, rubs. Abdomen:Bowel sounds are normal. Abdomen is soft and nontender with no organomegaly, hernias,masses. Extremities:  No cyanosis, clubbing. Mixed arthritic changes the hands present  Neurologic exam : Strength equal  in upper & lower extremities Balance,Rhomberg,finger to nose testing could not be completed due to clinical state Deep tendon reflexes are equal Skin: Warm & dry w/o tenting.   See summary under each active problem in the Problem List with associated updated therapeutic plan

## 2016-03-05 ENCOUNTER — Encounter (HOSPITAL_COMMUNITY)
Admission: RE | Admit: 2016-03-05 | Discharge: 2016-03-05 | Disposition: A | Discharge status: Home / Self Care | Payer: Medicare Other | Source: Skilled Nursing Facility | Attending: *Deleted | Admitting: *Deleted

## 2016-03-05 DIAGNOSIS — M159 Polyosteoarthritis, unspecified: Secondary | ICD-10-CM | POA: Diagnosis not present

## 2016-03-05 DIAGNOSIS — I5042 Chronic combined systolic (congestive) and diastolic (congestive) heart failure: Secondary | ICD-10-CM | POA: Diagnosis not present

## 2016-03-05 DIAGNOSIS — R569 Unspecified convulsions: Secondary | ICD-10-CM | POA: Diagnosis not present

## 2016-03-05 DIAGNOSIS — E039 Hypothyroidism, unspecified: Secondary | ICD-10-CM | POA: Diagnosis not present

## 2016-03-05 LAB — BASIC METABOLIC PANEL
Anion gap: 6 (ref 5–15)
BUN: 30 mg/dL — ABNORMAL HIGH (ref 6–20)
CHLORIDE: 104 mmol/L (ref 101–111)
CO2: 29 mmol/L (ref 22–32)
CREATININE: 0.72 mg/dL (ref 0.44–1.00)
Calcium: 8.4 mg/dL — ABNORMAL LOW (ref 8.9–10.3)
GFR calc non Af Amer: >60 mL/min (ref >60)
Glucose, Bld: 95 mg/dL (ref 65–99)
Potassium: 3.8 mmol/L (ref 3.5–5.1)
SODIUM: 139 mmol/L (ref 135–145)

## 2016-03-07 ENCOUNTER — Encounter (HOSPITAL_COMMUNITY)
Admission: RE | Admit: 2016-03-07 | Discharge: 2016-03-07 | Disposition: A | Discharge status: Home / Self Care | Payer: Medicare Other | Source: Skilled Nursing Facility | Attending: *Deleted | Admitting: *Deleted

## 2016-03-07 DIAGNOSIS — E876 Hypokalemia: Secondary | ICD-10-CM

## 2016-03-07 DIAGNOSIS — R569 Unspecified convulsions: Secondary | ICD-10-CM | POA: Diagnosis not present

## 2016-03-07 DIAGNOSIS — M159 Polyosteoarthritis, unspecified: Secondary | ICD-10-CM | POA: Diagnosis not present

## 2016-03-07 DIAGNOSIS — E039 Hypothyroidism, unspecified: Secondary | ICD-10-CM | POA: Diagnosis not present

## 2016-03-07 DIAGNOSIS — I5042 Chronic combined systolic (congestive) and diastolic (congestive) heart failure: Secondary | ICD-10-CM | POA: Diagnosis not present

## 2016-03-07 LAB — BASIC METABOLIC PANEL
Anion gap: 5 (ref 5–15)
BUN: 26 mg/dL — ABNORMAL HIGH (ref 6–20)
CALCIUM: 8.6 mg/dL — AB (ref 8.9–10.3)
CO2: 28 mmol/L (ref 22–32)
CREATININE: 0.79 mg/dL (ref 0.44–1.00)
Chloride: 104 mmol/L (ref 101–111)
GFR calc Af Amer: >60 mL/min (ref >60)
GLUCOSE: 98 mg/dL (ref 65–99)
Potassium: 3.8 mmol/L (ref 3.5–5.1)
Sodium: 137 mmol/L (ref 135–145)

## 2016-03-09 ENCOUNTER — Encounter (HOSPITAL_COMMUNITY)
Admission: RE | Admit: 2016-03-09 | Discharge: 2016-03-09 | Disposition: A | Discharge status: Home / Self Care | Payer: Medicare Other | Source: Skilled Nursing Facility | Attending: *Deleted | Admitting: *Deleted

## 2016-03-10 ENCOUNTER — Encounter (HOSPITAL_COMMUNITY)
Admission: RE | Admit: 2016-03-10 | Discharge: 2016-03-10 | Disposition: A | Discharge status: Home / Self Care | Payer: Medicare Other | Source: Skilled Nursing Facility | Attending: *Deleted | Admitting: *Deleted

## 2016-03-10 DIAGNOSIS — I5042 Chronic combined systolic (congestive) and diastolic (congestive) heart failure: Secondary | ICD-10-CM | POA: Diagnosis not present

## 2016-03-10 DIAGNOSIS — R569 Unspecified convulsions: Secondary | ICD-10-CM | POA: Diagnosis not present

## 2016-03-10 DIAGNOSIS — M159 Polyosteoarthritis, unspecified: Secondary | ICD-10-CM | POA: Diagnosis not present

## 2016-03-10 DIAGNOSIS — E039 Hypothyroidism, unspecified: Secondary | ICD-10-CM | POA: Diagnosis not present

## 2016-03-10 LAB — BASIC METABOLIC PANEL
Anion gap: 6 (ref 5–15)
BUN: 25 mg/dL — ABNORMAL HIGH (ref 6–20)
CO2: 30 mmol/L (ref 22–32)
Calcium: 8.6 mg/dL — ABNORMAL LOW (ref 8.9–10.3)
Chloride: 103 mmol/L (ref 101–111)
Creatinine, Ser: 0.63 mg/dL (ref 0.44–1.00)
Glucose, Bld: 97 mg/dL (ref 65–99)
POTASSIUM: 3.7 mmol/L (ref 3.5–5.1)
SODIUM: 139 mmol/L (ref 135–145)

## 2016-03-11 ENCOUNTER — Encounter: Payer: Self-pay | Admitting: Internal Medicine

## 2016-03-11 ENCOUNTER — Non-Acute Institutional Stay (SKILLED_NURSING_FACILITY): Payer: Medicare Other | Admitting: Internal Medicine

## 2016-03-11 DIAGNOSIS — R600 Localized edema: Secondary | ICD-10-CM | POA: Diagnosis not present

## 2016-03-11 DIAGNOSIS — E876 Hypokalemia: Secondary | ICD-10-CM

## 2016-03-11 DIAGNOSIS — R609 Edema, unspecified: Secondary | ICD-10-CM | POA: Insufficient documentation

## 2016-03-11 DIAGNOSIS — I10 Essential (primary) hypertension: Secondary | ICD-10-CM

## 2016-03-11 NOTE — Assessment & Plan Note (Signed)
Blood pressure well controlled; no change

## 2016-03-11 NOTE — Progress Notes (Signed)
Patient ID: Erica [name], female   DOB: 10/27/1916, 82 y.o.   MRN: [id number]      [facility] Room: 142  Chief Complaint  Patient presents with  . Acute Visit    Weight Change   Allergies  Allergen Reactions  . Clarithromycin Other (See Comments)    "made me ill"-reaction years ago  . Ditropan [Oxybutynin Chloride] Itching and Other (See Comments)    Constipation  . Prednisone Other (See Comments)    Stomach pain & dizziness  . Vioxx [Rofecoxib] Other (See Comments)    dizziness  . Biaxin [Clarithromycin]   . Erythromycin   . Keflex [Cephalexin]   . Macrodantin [Nitrofurantoin Macrocrystal]     This is a nursing facility follow up of her apparent primary  aldosteronism associated with profound hypokalemia, suppressed renin, and elevated os aldosterone/renin ratio. The torsemide has been decreased from 3 times a day to once daily as spironolactone was increased to twice a day. Her potassium has been weaned to a dose of 60 mEq daily. Initially she was 140 mEq daily. Serial weights dating to 5/2 were reviewed. Weight has varied from 157.8 to 161.2 on 5/29; a gain of 3.4 pounds.   Review of systems:She describes frequency which has changed minimally since torsemide has been decreased. She has nocturia 1-2 times per night. She has persistent swelling in the left lower extremity only. She denies any other cardiopulmonary or genitourinary symptoms. Cardiovascular: No chest pain, palpitations,paroxysmal nocturnal dyspnea, claudication  Respiratory: No cough, sputum production,hemoptysis, DOE , significant snoring,apnea  Genitourinary: No dysuria,hematuria, pyuria,  incontinence Endocrine: No change in hair/skin/ nails, excessive thirst, excessive hunger, excessive urination    Physical exam:  Pertinent or positive findings: She is very alert and interactive. She has minor rales at the bases. Heart rhythm is irregular. Abdomen is protuberant.  She has 1/2+ edema of  the left lower extremity. She has stasis hyperpigmentation changes of lower extremities as well as diffuse keratotic lesions.  Homans sign is negative General appearance:Adequately nourished; no acute distress , increased work of breathing is present.   Lymphatic: No lymphadenopathy about the head, neck, axilla . Eyes: No conjunctival inflammation or lid edema is present. There is no scleral icterus. Ears:  External ear exam shows no significant lesions or deformities.   Nose:  External nasal examination shows no deformity or inflammation. Nasal mucosa are pink and moist without lesions ,exudates Oral exam: lips and gums are healthy appearing.There is no oropharyngeal erythema or exudate . Neck:  No thyromegaly, masses, tenderness noted.    Heart:  No gallop, murmur, click, rub .  Abdomen:Bowel sounds are normal. Abdomen is soft and nontender with no organomegaly, hernias,masses. Extremities:  No cyanosis, clubbing  Skin: Warm & dry w/o tenting. No significant lesions or rash.    See summary under each active problem in the Problem List with associated updated therapeutic plan

## 2016-03-11 NOTE — Assessment & Plan Note (Signed)
No change in present medications Recheck BMET 6/5

## 2016-03-11 NOTE — Patient Instructions (Signed)
BMET 6/5

## 2016-03-15 ENCOUNTER — Encounter (HOSPITAL_COMMUNITY): Admission: RE | Admit: 2016-03-15 | Payer: Medicare Other | Source: Skilled Nursing Facility | Admitting: *Deleted

## 2016-03-26 ENCOUNTER — Encounter (HOSPITAL_COMMUNITY)
Admission: RE | Admit: 2016-03-26 | Discharge: 2016-03-26 | Disposition: A | Discharge status: Home / Self Care | Payer: Medicare Other | Source: Skilled Nursing Facility | Attending: Internal Medicine | Admitting: Internal Medicine

## 2016-03-26 DIAGNOSIS — I5042 Chronic combined systolic (congestive) and diastolic (congestive) heart failure: Secondary | ICD-10-CM | POA: Insufficient documentation

## 2016-03-26 DIAGNOSIS — F329 Major depressive disorder, single episode, unspecified: Secondary | ICD-10-CM | POA: Diagnosis not present

## 2016-03-26 DIAGNOSIS — M159 Polyosteoarthritis, unspecified: Secondary | ICD-10-CM | POA: Diagnosis not present

## 2016-03-26 DIAGNOSIS — I4891 Unspecified atrial fibrillation: Secondary | ICD-10-CM | POA: Insufficient documentation

## 2016-03-26 DIAGNOSIS — M19011 Primary osteoarthritis, right shoulder: Secondary | ICD-10-CM | POA: Diagnosis not present

## 2016-03-26 LAB — BASIC METABOLIC PANEL
Anion gap: 6 (ref 5–15)
BUN: 23 mg/dL — AB (ref 6–20)
CHLORIDE: 104 mmol/L (ref 101–111)
CO2: 28 mmol/L (ref 22–32)
Calcium: 8.5 mg/dL — ABNORMAL LOW (ref 8.9–10.3)
Creatinine, Ser: 0.7 mg/dL (ref 0.44–1.00)
GFR calc Af Amer: >60 mL/min (ref >60)
GLUCOSE: 97 mg/dL (ref 65–99)
POTASSIUM: 3.7 mmol/L (ref 3.5–5.1)
Sodium: 138 mmol/L (ref 135–145)

## 2016-04-07 ENCOUNTER — Encounter: Payer: Self-pay | Admitting: Internal Medicine

## 2016-04-07 ENCOUNTER — Non-Acute Institutional Stay (SKILLED_NURSING_FACILITY): Payer: Medicare Other | Admitting: Internal Medicine

## 2016-04-07 DIAGNOSIS — I5042 Chronic combined systolic (congestive) and diastolic (congestive) heart failure: Secondary | ICD-10-CM | POA: Diagnosis not present

## 2016-04-07 DIAGNOSIS — G40909 Epilepsy, unspecified, not intractable, without status epilepticus: Secondary | ICD-10-CM

## 2016-04-07 DIAGNOSIS — I10 Essential (primary) hypertension: Secondary | ICD-10-CM | POA: Diagnosis not present

## 2016-04-07 DIAGNOSIS — E876 Hypokalemia: Secondary | ICD-10-CM | POA: Diagnosis not present

## 2016-04-07 DIAGNOSIS — I4891 Unspecified atrial fibrillation: Secondary | ICD-10-CM
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
## 2016-04-08 ENCOUNTER — Encounter (HOSPITAL_COMMUNITY)
Admission: AD | Admit: 2016-04-08 | Discharge: 2016-04-08 | Disposition: A | Discharge status: Home / Self Care | Payer: Medicare Other | Source: Skilled Nursing Facility | Attending: Internal Medicine | Admitting: Internal Medicine

## 2016-04-08 DIAGNOSIS — I5042 Chronic combined systolic (congestive) and diastolic (congestive) heart failure: Secondary | ICD-10-CM | POA: Diagnosis not present

## 2016-04-08 DIAGNOSIS — M159 Polyosteoarthritis, unspecified: Secondary | ICD-10-CM | POA: Diagnosis not present

## 2016-04-08 DIAGNOSIS — I4891 Unspecified atrial fibrillation: Secondary | ICD-10-CM | POA: Diagnosis not present

## 2016-04-08 DIAGNOSIS — F329 Major depressive disorder, single episode, unspecified: Secondary | ICD-10-CM | POA: Diagnosis not present

## 2016-04-08 DIAGNOSIS — M19011 Primary osteoarthritis, right shoulder: Secondary | ICD-10-CM | POA: Diagnosis not present

## 2016-04-08 LAB — COMPREHENSIVE METABOLIC PANEL
ALBUMIN: 3.6 g/dL (ref 3.5–5.0)
ALK PHOS: 139 U/L — AB (ref 38–126)
ALT: 24 U/L (ref 14–54)
ANION GAP: 7 (ref 5–15)
AST: 26 U/L (ref 15–41)
BILIRUBIN TOTAL: 0.8 mg/dL (ref 0.3–1.2)
BUN: 29 mg/dL — ABNORMAL HIGH (ref 6–20)
CALCIUM: 8.4 mg/dL — AB (ref 8.9–10.3)
CO2: 27 mmol/L (ref 22–32)
Chloride: 104 mmol/L (ref 101–111)
Creatinine, Ser: 0.72 mg/dL (ref 0.44–1.00)
GFR calc non Af Amer: >60 mL/min (ref >60)
GLUCOSE: 99 mg/dL (ref 65–99)
Potassium: 3.9 mmol/L (ref 3.5–5.1)
Sodium: 138 mmol/L (ref 135–145)
TOTAL PROTEIN: 6.2 g/dL — AB (ref 6.5–8.1)

## 2016-04-08 LAB — CBC WITH DIFFERENTIAL/PLATELET
Basophils Absolute: 0 10*3/uL (ref 0.0–0.1)
Basophils Relative: 0 %
EOS PCT: 5 %
Eosinophils Absolute: 0.2 10*3/uL (ref 0.0–0.7)
HCT: 37.7 % (ref 36.0–46.0)
Hemoglobin: 12.4 g/dL (ref 12.0–15.0)
LYMPHS ABS: 1.6 10*3/uL (ref 0.7–4.0)
LYMPHS PCT: 35 %
MCH: 32 pg (ref 26.0–34.0)
MCHC: 32.9 g/dL (ref 30.0–36.0)
MCV: 97.2 fL (ref 78.0–100.0)
MONO ABS: 0.5 10*3/uL (ref 0.1–1.0)
Monocytes Relative: 11 %
Neutro Abs: 2.3 10*3/uL (ref 1.7–7.7)
Neutrophils Relative %: 49 %
Platelets: 158 10*3/uL (ref 150–400)
RBC: 3.88 MIL/uL (ref 3.87–5.11)
RDW: 13.6 % (ref 11.5–15.5)
WBC: 4.7 10*3/uL (ref 4.0–10.5)

## 2016-04-08 LAB — TSH: TSH: 2.48 u[IU]/mL (ref 0.350–4.500)

## 2016-04-08 LAB — PHENYTOIN LEVEL, TOTAL: Phenytoin Lvl: 12 ug/mL (ref 10.0–20.0)

## 2016-04-09 DIAGNOSIS — F329 Major depressive disorder, single episode, unspecified: Secondary | ICD-10-CM | POA: Diagnosis not present

## 2016-04-09 DIAGNOSIS — F419 Anxiety disorder, unspecified: Secondary | ICD-10-CM | POA: Diagnosis not present

## 2016-04-19 DIAGNOSIS — M79671 Pain in right foot: Secondary | ICD-10-CM | POA: Diagnosis not present

## 2016-04-19 DIAGNOSIS — M79672 Pain in left foot: Secondary | ICD-10-CM | POA: Diagnosis not present

## 2016-04-19 DIAGNOSIS — B351 Tinea unguium: Secondary | ICD-10-CM | POA: Diagnosis not present

## 2016-05-13 ENCOUNTER — Encounter: Payer: Self-pay | Admitting: Internal Medicine

## 2016-05-13 ENCOUNTER — Non-Acute Institutional Stay (SKILLED_NURSING_FACILITY): Payer: Medicare Other | Admitting: Internal Medicine

## 2016-05-13 DIAGNOSIS — I5042 Chronic combined systolic (congestive) and diastolic (congestive) heart failure: Secondary | ICD-10-CM | POA: Diagnosis not present

## 2016-05-13 DIAGNOSIS — I10 Essential (primary) hypertension: Secondary | ICD-10-CM

## 2016-05-13 DIAGNOSIS — E876 Hypokalemia: Secondary | ICD-10-CM | POA: Diagnosis not present

## 2016-05-13 DIAGNOSIS — G40909 Epilepsy, unspecified, not intractable, without status epilepticus: Secondary | ICD-10-CM | POA: Diagnosis not present

## 2016-05-13 NOTE — Assessment & Plan Note (Signed)
Blood pressure well controlled BMET

## 2016-05-13 NOTE — Assessment & Plan Note (Signed)
No recurrence of seizures Phenytoin levels have not been toxic No change in therapy

## 2016-05-13 NOTE — Progress Notes (Signed)
[facility] Dr. Linna Darner DNR Chief Complaint  Patient presents with  . Medical Management of Chronic Issues    Medical Management of Chronic Issues   Allergies  Allergen Reactions  . Clarithromycin Other (See Comments)    "made me ill"-reaction years ago  . Ditropan [Oxybutynin Chloride] Itching and Other (See Comments)    Constipation  . Prednisone Other (See Comments)    Stomach pain & dizziness  . Vioxx [Rofecoxib] Other (See Comments)    dizziness  . Biaxin [Clarithromycin]   . Erythromycin   . Keflex [Cephalexin]   . Macrodantin [Nitrofurantoin Macrocrystal]   This is a nursing facility follow up of chronic medical diagnoses  Interim medical record and care since last [place] visit was updated with review of diagnostic studies and change in clinical status since last visit were documented.  HPI:She's exhibited profound hypokalemia for which she was receiving up to 140 mEq of potassium daily in May 2017. Plasma aldosterone and renin studies suggested possible primary hyperaldosteronism. The potassium was weaned to the present dose of 40 mEq a day and spironolactone was titrated to  25 mg twice a day. On 04/08/16 renal function was normal. Potassium was 3.9. She is on phenytoin w/o seizures. She is hypothyroid; TSH has been therapeutic. The most recent value was 2.48 on 6/29.  Comprehensive review of systems is negative except for occasional is bleeds without other bleeding dyscrasias and frequently awakening, up to twice a night. She denies paroxysmal nocturnal dyspnea.  Constitutional: No fever,significant weight change, fatigue  Eyes: No redness, discharge, pain, vision change ENT/mouth: No nasal congestion,  purulent discharge, earache,change in hearing ,sore throat  Cardiovascular: No chest pain, palpitations,paroxysmal nocturnal dyspnea, claudication, edema  Respiratory: No cough, sputum production,hemoptysis, DOE , significant snoring,apnea   Gastrointestinal: No heartburn,dysphagia,abdominal pain, nausea / vomiting,rectal bleeding, melena,change in bowels Genitourinary: No dysuria,hematuria, pyuria,  incontinence, nocturia Musculoskeletal: No joint stiffness, joint swelling, weakness,pain Dermatologic: No rash, pruritus, change in appearance of skin Neurologic: No dizziness,headache,syncope, seizures, numbness , tingling Psychiatric: No significant anxiety , depression,frank  insomnia, anorexia Endocrine: No change in hair/skin/ nails, excessive thirst, excessive hunger, excessive urination  Hematologic/lymphatic: No significant bruising, lymphadenopathy Allergy/immunology: No itchy/ watery eyes, significant sneezing, urticaria, angioedema  Physical exam:  Pertinent or positive findings:Appears younger than stated age. She does have some decreased auditory acuity. Bilateral ptosis is present. Second heart sound is increased and she has occasional premature beats. She has minimal rales mainly for left lower lobe. She is wearing TED hose. She has one half-1+ edema of the left extremity and trace edema on the right. The right posterior tibial pulse is strong, all others are decreased. Isolated DIP osteoarthritic changes. She has scattered keratotic changes of the skin. She is oriented 3. She could not remember the president's name but knew that he was a Republican and she "wants to give him a chance"  General appearance:Adequately nourished; no acute distress , increased work of breathing is present.   Lymphatic: No lymphadenopathy about the head, neck, axilla . Eyes: No conjunctival inflammation or lid edema is present. There is no scleral icterus. Ears:  External ear exam shows no significant lesions or deformities.   Nose:  External nasal examination shows no deformity or inflammation. Nasal mucosa are pink and moist without lesions ,exudates Oral exam: lips and gums are healthy appearing.There is no oropharyngeal erythema or  exudate . Neck:  No thyromegaly, masses, tenderness noted.    Heart:  Normal rate . S1 normal  without gallop, murmur, click, rub .  Lungs: without wheezes, rhonchi, rubs. Abdomen:Bowel sounds are normal. Abdomen is soft and nontender with no organomegaly, hernias,masses. GU: deferred as previously addressed. Extremities:  No cyanosis, clubbing  Neurologic exam : Strength equal  in upper & lower extremities Balance,Rhomberg,finger to nose testing could not be completed due to clinical state Deep tendon reflexes are equal Skin: Warm & dry w/o tenting. No significant lesions or rash.    See summary under each active problem in the Problem List with associated updated therapeutic plan

## 2016-05-13 NOTE — Assessment & Plan Note (Signed)
BMET is clinically appropriate because of the ongoing KCL supplementation with spironolactone therapy.

## 2016-05-13 NOTE — Patient Instructions (Signed)
New orders for [facility] entry. BMET because of combined potassium chloride and spironolactone therapy

## 2016-05-13 NOTE — Assessment & Plan Note (Signed)
Clinically there is no evidence of cardiac decompensation No change in therapy

## 2016-05-21 ENCOUNTER — Encounter (HOSPITAL_COMMUNITY)
Admission: RE | Admit: 2016-05-21 | Discharge: 2016-05-21 | Disposition: A | Discharge status: Home / Self Care | Payer: Medicare Other | Source: Skilled Nursing Facility | Attending: Internal Medicine | Admitting: Internal Medicine

## 2016-05-21 DIAGNOSIS — E039 Hypothyroidism, unspecified: Secondary | ICD-10-CM | POA: Diagnosis not present

## 2016-05-21 DIAGNOSIS — R569 Unspecified convulsions: Secondary | ICD-10-CM | POA: Diagnosis not present

## 2016-05-21 DIAGNOSIS — M159 Polyosteoarthritis, unspecified: Secondary | ICD-10-CM | POA: Diagnosis not present

## 2016-05-21 DIAGNOSIS — I5042 Chronic combined systolic (congestive) and diastolic (congestive) heart failure: Secondary | ICD-10-CM | POA: Insufficient documentation

## 2016-05-21 LAB — BASIC METABOLIC PANEL
Anion gap: 5 (ref 5–15)
BUN: 24 mg/dL — ABNORMAL HIGH (ref 6–20)
CALCIUM: 8.4 mg/dL — AB (ref 8.9–10.3)
CHLORIDE: 101 mmol/L (ref 101–111)
CO2: 31 mmol/L (ref 22–32)
Creatinine, Ser: 0.92 mg/dL (ref 0.44–1.00)
GFR calc Af Amer: 59 mL/min — ABNORMAL LOW (ref >60)
GFR calc non Af Amer: 51 mL/min — ABNORMAL LOW (ref >60)
Glucose, Bld: 153 mg/dL — ABNORMAL HIGH (ref 65–99)
Potassium: 4.5 mmol/L (ref 3.5–5.1)
SODIUM: 137 mmol/L (ref 135–145)

## 2016-06-16 ENCOUNTER — Encounter: Payer: Self-pay | Admitting: Internal Medicine

## 2016-06-16 ENCOUNTER — Non-Acute Institutional Stay (SKILLED_NURSING_FACILITY): Payer: Medicare Other | Admitting: Internal Medicine

## 2016-06-16 DIAGNOSIS — I4891 Unspecified atrial fibrillation: Secondary | ICD-10-CM

## 2016-06-16 DIAGNOSIS — I5042 Chronic combined systolic (congestive) and diastolic (congestive) heart failure: Secondary | ICD-10-CM | POA: Diagnosis not present

## 2016-06-16 DIAGNOSIS — H6123 Impacted cerumen, bilateral: Secondary | ICD-10-CM | POA: Diagnosis not present

## 2016-06-16 DIAGNOSIS — E876 Hypokalemia: Secondary | ICD-10-CM

## 2016-06-16 DIAGNOSIS — G40909 Epilepsy, unspecified, not intractable, without status epilepticus: Secondary | ICD-10-CM | POA: Diagnosis not present
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
## 2016-06-17 ENCOUNTER — Encounter (HOSPITAL_COMMUNITY)
Admission: AD | Admit: 2016-06-17 | Discharge: 2016-06-17 | Disposition: A | Discharge status: Home / Self Care | Payer: Medicare Other | Source: Skilled Nursing Facility | Attending: Internal Medicine | Admitting: Internal Medicine

## 2016-06-17 DIAGNOSIS — M159 Polyosteoarthritis, unspecified: Secondary | ICD-10-CM | POA: Insufficient documentation

## 2016-06-17 DIAGNOSIS — I4891 Unspecified atrial fibrillation: Secondary | ICD-10-CM | POA: Diagnosis not present

## 2016-06-17 DIAGNOSIS — I5042 Chronic combined systolic (congestive) and diastolic (congestive) heart failure: Secondary | ICD-10-CM | POA: Insufficient documentation

## 2016-06-17 DIAGNOSIS — F329 Major depressive disorder, single episode, unspecified: Secondary | ICD-10-CM | POA: Insufficient documentation

## 2016-06-17 DIAGNOSIS — M19011 Primary osteoarthritis, right shoulder: Secondary | ICD-10-CM | POA: Diagnosis not present

## 2016-06-17 LAB — CBC WITH DIFFERENTIAL/PLATELET
BASOS ABS: 0 10*3/uL (ref 0.0–0.1)
Basophils Relative: 1 %
EOS ABS: 0.3 10*3/uL (ref 0.0–0.7)
EOS PCT: 6 %
HCT: 41 % (ref 36.0–46.0)
Hemoglobin: 13.5 g/dL (ref 12.0–15.0)
LYMPHS PCT: 39 %
Lymphs Abs: 2 10*3/uL (ref 0.7–4.0)
MCH: 32.1 pg (ref 26.0–34.0)
MCHC: 32.9 g/dL (ref 30.0–36.0)
MCV: 97.6 fL (ref 78.0–100.0)
Monocytes Absolute: 0.6 10*3/uL (ref 0.1–1.0)
Monocytes Relative: 12 %
NEUTROS PCT: 42 %
Neutro Abs: 2.2 10*3/uL (ref 1.7–7.7)
PLATELETS: 157 10*3/uL (ref 150–400)
RBC: 4.2 MIL/uL (ref 3.87–5.11)
RDW: 13.9 % (ref 11.5–15.5)
WBC: 5.1 10*3/uL (ref 4.0–10.5)

## 2016-06-17 LAB — BASIC METABOLIC PANEL
ANION GAP: 8 (ref 5–15)
BUN: 27 mg/dL — ABNORMAL HIGH (ref 6–20)
CHLORIDE: 102 mmol/L (ref 101–111)
CO2: 29 mmol/L (ref 22–32)
Calcium: 8.8 mg/dL — ABNORMAL LOW (ref 8.9–10.3)
Creatinine, Ser: 0.74 mg/dL (ref 0.44–1.00)
GFR calc Af Amer: >60 mL/min (ref >60)
Glucose, Bld: 98 mg/dL (ref 65–99)
Potassium: 3.8 mmol/L (ref 3.5–5.1)
SODIUM: 139 mmol/L (ref 135–145)

## 2016-06-17 LAB — PHENYTOIN LEVEL, TOTAL: PHENYTOIN LVL: 14.8 ug/mL (ref 10.0–20.0)

## 2016-07-13 ENCOUNTER — Other Ambulatory Visit (HOSPITAL_COMMUNITY)
Admission: AD | Admit: 2016-07-13 | Discharge: 2016-07-13 | Disposition: A | Discharge status: Home / Self Care | Payer: Medicare Other | Source: Skilled Nursing Facility | Attending: Internal Medicine | Admitting: Internal Medicine

## 2016-07-13 DIAGNOSIS — N390 Urinary tract infection, site not specified: Secondary | ICD-10-CM | POA: Insufficient documentation

## 2016-07-14 DIAGNOSIS — R279 Unspecified lack of coordination: Secondary | ICD-10-CM | POA: Diagnosis not present

## 2016-07-14 DIAGNOSIS — I5042 Chronic combined systolic (congestive) and diastolic (congestive) heart failure: Secondary | ICD-10-CM | POA: Diagnosis not present

## 2016-07-14 DIAGNOSIS — M6281 Muscle weakness (generalized): Secondary | ICD-10-CM | POA: Diagnosis not present

## 2016-07-14 DIAGNOSIS — M159 Polyosteoarthritis, unspecified: Secondary | ICD-10-CM | POA: Diagnosis not present

## 2016-07-14 DIAGNOSIS — R488 Other symbolic dysfunctions: Secondary | ICD-10-CM | POA: Diagnosis not present

## 2016-07-15 DIAGNOSIS — R488 Other symbolic dysfunctions: Secondary | ICD-10-CM | POA: Diagnosis not present

## 2016-07-15 DIAGNOSIS — M6281 Muscle weakness (generalized): Secondary | ICD-10-CM | POA: Diagnosis not present

## 2016-07-15 DIAGNOSIS — R279 Unspecified lack of coordination: Secondary | ICD-10-CM | POA: Diagnosis not present

## 2016-07-15 DIAGNOSIS — M159 Polyosteoarthritis, unspecified: Secondary | ICD-10-CM | POA: Diagnosis not present

## 2016-07-15 DIAGNOSIS — I5042 Chronic combined systolic (congestive) and diastolic (congestive) heart failure: Secondary | ICD-10-CM | POA: Diagnosis not present

## 2016-07-17 LAB — URINE CULTURE

## 2016-07-19 DIAGNOSIS — I5042 Chronic combined systolic (congestive) and diastolic (congestive) heart failure: Secondary | ICD-10-CM | POA: Diagnosis not present

## 2016-07-19 DIAGNOSIS — M159 Polyosteoarthritis, unspecified: Secondary | ICD-10-CM | POA: Diagnosis not present

## 2016-07-19 DIAGNOSIS — M6281 Muscle weakness (generalized): Secondary | ICD-10-CM | POA: Diagnosis not present

## 2016-07-19 DIAGNOSIS — R488 Other symbolic dysfunctions: Secondary | ICD-10-CM | POA: Diagnosis not present

## 2016-07-19 DIAGNOSIS — R279 Unspecified lack of coordination: Secondary | ICD-10-CM | POA: Diagnosis not present

## 2016-07-20 DIAGNOSIS — Z961 Presence of intraocular lens: Secondary | ICD-10-CM | POA: Diagnosis not present

## 2016-07-20 DIAGNOSIS — M6281 Muscle weakness (generalized): Secondary | ICD-10-CM | POA: Diagnosis not present

## 2016-07-20 DIAGNOSIS — Z7901 Long term (current) use of anticoagulants: Secondary | ICD-10-CM | POA: Diagnosis not present

## 2016-07-20 DIAGNOSIS — D4981 Neoplasm of unspecified behavior of retina and choroid: Secondary | ICD-10-CM | POA: Diagnosis not present

## 2016-07-20 DIAGNOSIS — R488 Other symbolic dysfunctions: Secondary | ICD-10-CM | POA: Diagnosis not present

## 2016-07-20 DIAGNOSIS — I5042 Chronic combined systolic (congestive) and diastolic (congestive) heart failure: Secondary | ICD-10-CM | POA: Diagnosis not present

## 2016-07-20 DIAGNOSIS — R279 Unspecified lack of coordination: Secondary | ICD-10-CM | POA: Diagnosis not present

## 2016-07-20 DIAGNOSIS — M159 Polyosteoarthritis, unspecified: Secondary | ICD-10-CM | POA: Diagnosis not present

## 2016-07-21 DIAGNOSIS — I5042 Chronic combined systolic (congestive) and diastolic (congestive) heart failure: Secondary | ICD-10-CM | POA: Diagnosis not present

## 2016-07-21 DIAGNOSIS — M6281 Muscle weakness (generalized): Secondary | ICD-10-CM | POA: Diagnosis not present

## 2016-07-21 DIAGNOSIS — R488 Other symbolic dysfunctions: Secondary | ICD-10-CM | POA: Diagnosis not present

## 2016-07-21 DIAGNOSIS — R279 Unspecified lack of coordination: Secondary | ICD-10-CM | POA: Diagnosis not present

## 2016-07-21 DIAGNOSIS — M159 Polyosteoarthritis, unspecified: Secondary | ICD-10-CM | POA: Diagnosis not present

## 2016-07-22 DIAGNOSIS — R279 Unspecified lack of coordination: Secondary | ICD-10-CM | POA: Diagnosis not present

## 2016-07-22 DIAGNOSIS — R488 Other symbolic dysfunctions: Secondary | ICD-10-CM | POA: Diagnosis not present

## 2016-07-22 DIAGNOSIS — M159 Polyosteoarthritis, unspecified: Secondary | ICD-10-CM | POA: Diagnosis not present

## 2016-07-22 DIAGNOSIS — M6281 Muscle weakness (generalized): Secondary | ICD-10-CM | POA: Diagnosis not present

## 2016-07-22 DIAGNOSIS — I5042 Chronic combined systolic (congestive) and diastolic (congestive) heart failure: Secondary | ICD-10-CM | POA: Diagnosis not present

## 2016-07-23 DIAGNOSIS — M6281 Muscle weakness (generalized): Secondary | ICD-10-CM | POA: Diagnosis not present

## 2016-07-23 DIAGNOSIS — M159 Polyosteoarthritis, unspecified: Secondary | ICD-10-CM | POA: Diagnosis not present

## 2016-07-23 DIAGNOSIS — R279 Unspecified lack of coordination: Secondary | ICD-10-CM | POA: Diagnosis not present

## 2016-07-23 DIAGNOSIS — R488 Other symbolic dysfunctions: Secondary | ICD-10-CM | POA: Diagnosis not present

## 2016-07-23 DIAGNOSIS — I5042 Chronic combined systolic (congestive) and diastolic (congestive) heart failure: Secondary | ICD-10-CM | POA: Diagnosis not present

## 2016-07-26 ENCOUNTER — Non-Acute Institutional Stay (SKILLED_NURSING_FACILITY): Payer: Medicare Other | Admitting: Internal Medicine

## 2016-07-26 ENCOUNTER — Encounter: Payer: Self-pay | Admitting: Internal Medicine

## 2016-07-26 DIAGNOSIS — R488 Other symbolic dysfunctions: Secondary | ICD-10-CM | POA: Diagnosis not present

## 2016-07-26 DIAGNOSIS — E038 Other specified hypothyroidism: Secondary | ICD-10-CM

## 2016-07-26 DIAGNOSIS — M159 Polyosteoarthritis, unspecified: Secondary | ICD-10-CM | POA: Diagnosis not present

## 2016-07-26 DIAGNOSIS — R279 Unspecified lack of coordination: Secondary | ICD-10-CM | POA: Diagnosis not present

## 2016-07-26 DIAGNOSIS — I10 Essential (primary) hypertension: Secondary | ICD-10-CM

## 2016-07-26 DIAGNOSIS — I4891 Unspecified atrial fibrillation: Secondary | ICD-10-CM | POA: Diagnosis not present

## 2016-07-26 DIAGNOSIS — M6281 Muscle weakness (generalized): Secondary | ICD-10-CM | POA: Diagnosis not present

## 2016-07-26 DIAGNOSIS — N390 Urinary tract infection, site not specified: Secondary | ICD-10-CM

## 2016-07-26 DIAGNOSIS — G40909 Epilepsy, unspecified, not intractable, without status epilepticus: Secondary | ICD-10-CM | POA: Diagnosis not present

## 2016-07-26 DIAGNOSIS — I5042 Chronic combined systolic (congestive) and diastolic (congestive) heart failure: Secondary | ICD-10-CM | POA: Diagnosis not present
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
## 2016-07-27 DIAGNOSIS — R279 Unspecified lack of coordination: Secondary | ICD-10-CM | POA: Diagnosis not present

## 2016-07-27 DIAGNOSIS — R488 Other symbolic dysfunctions: Secondary | ICD-10-CM | POA: Diagnosis not present

## 2016-07-27 DIAGNOSIS — M159 Polyosteoarthritis, unspecified: Secondary | ICD-10-CM | POA: Diagnosis not present

## 2016-07-27 DIAGNOSIS — I5042 Chronic combined systolic (congestive) and diastolic (congestive) heart failure: Secondary | ICD-10-CM | POA: Diagnosis not present

## 2016-07-27 DIAGNOSIS — M6281 Muscle weakness (generalized): Secondary | ICD-10-CM | POA: Diagnosis not present

## 2016-07-28 DIAGNOSIS — M159 Polyosteoarthritis, unspecified: Secondary | ICD-10-CM | POA: Diagnosis not present

## 2016-07-28 DIAGNOSIS — R488 Other symbolic dysfunctions: Secondary | ICD-10-CM | POA: Diagnosis not present

## 2016-07-28 DIAGNOSIS — F419 Anxiety disorder, unspecified: Secondary | ICD-10-CM | POA: Diagnosis not present

## 2016-07-28 DIAGNOSIS — I5042 Chronic combined systolic (congestive) and diastolic (congestive) heart failure: Secondary | ICD-10-CM | POA: Diagnosis not present

## 2016-07-28 DIAGNOSIS — M6281 Muscle weakness (generalized): Secondary | ICD-10-CM | POA: Diagnosis not present

## 2016-07-28 DIAGNOSIS — R279 Unspecified lack of coordination: Secondary | ICD-10-CM | POA: Diagnosis not present

## 2016-07-28 DIAGNOSIS — F329 Major depressive disorder, single episode, unspecified: Secondary | ICD-10-CM | POA: Diagnosis not present

## 2016-07-29 DIAGNOSIS — R279 Unspecified lack of coordination: Secondary | ICD-10-CM | POA: Diagnosis not present

## 2016-07-29 DIAGNOSIS — M159 Polyosteoarthritis, unspecified: Secondary | ICD-10-CM | POA: Diagnosis not present

## 2016-07-29 DIAGNOSIS — R488 Other symbolic dysfunctions: Secondary | ICD-10-CM | POA: Diagnosis not present

## 2016-07-29 DIAGNOSIS — M6281 Muscle weakness (generalized): Secondary | ICD-10-CM | POA: Diagnosis not present

## 2016-07-29 DIAGNOSIS — I5042 Chronic combined systolic (congestive) and diastolic (congestive) heart failure: Secondary | ICD-10-CM | POA: Diagnosis not present

## 2016-08-01 ENCOUNTER — Ambulatory Visit (HOSPITAL_COMMUNITY)
Admit: 2016-08-01 | Discharge: 2016-08-01 | Disposition: A | Discharge status: Home / Self Care | Payer: Medicare Other | Source: Ambulatory Visit | Attending: Emergency Medicine | Admitting: Emergency Medicine

## 2016-08-01 ENCOUNTER — Emergency Department (HOSPITAL_COMMUNITY)
Admission: EM | Admit: 2016-08-01 | Discharge: 2016-08-01 | Disposition: A | Discharge status: Other Acute Inpatient Hospital | Payer: No Typology Code available for payment source

## 2016-08-01 DIAGNOSIS — R609 Edema, unspecified: Secondary | ICD-10-CM | POA: Insufficient documentation

## 2016-08-01 DIAGNOSIS — W19XXXA Unspecified fall, initial encounter: Secondary | ICD-10-CM | POA: Diagnosis not present

## 2016-08-01 DIAGNOSIS — S0003XA Contusion of scalp, initial encounter: Secondary | ICD-10-CM | POA: Diagnosis not present

## 2016-08-01 DIAGNOSIS — D320 Benign neoplasm of cerebral meninges: Secondary | ICD-10-CM | POA: Diagnosis not present

## 2016-08-01 DIAGNOSIS — T148XXA Other injury of unspecified body region, initial encounter: Secondary | ICD-10-CM | POA: Diagnosis present

## 2016-08-01 DIAGNOSIS — R569 Unspecified convulsions: Secondary | ICD-10-CM | POA: Diagnosis not present

## 2016-08-02 ENCOUNTER — Non-Acute Institutional Stay (SKILLED_NURSING_FACILITY): Payer: Medicare Other | Admitting: Internal Medicine

## 2016-08-02 ENCOUNTER — Encounter: Payer: Self-pay | Admitting: Internal Medicine

## 2016-08-02 DIAGNOSIS — M159 Polyosteoarthritis, unspecified: Secondary | ICD-10-CM | POA: Diagnosis not present

## 2016-08-02 DIAGNOSIS — D329 Benign neoplasm of meninges, unspecified: Secondary | ICD-10-CM

## 2016-08-02 DIAGNOSIS — S0511XD Contusion of eyeball and orbital tissues, right eye, subsequent encounter: Secondary | ICD-10-CM

## 2016-08-02 DIAGNOSIS — R488 Other symbolic dysfunctions: Secondary | ICD-10-CM | POA: Diagnosis not present

## 2016-08-02 DIAGNOSIS — I5042 Chronic combined systolic (congestive) and diastolic (congestive) heart failure: Secondary | ICD-10-CM | POA: Diagnosis not present

## 2016-08-02 DIAGNOSIS — R279 Unspecified lack of coordination: Secondary | ICD-10-CM | POA: Diagnosis not present

## 2016-08-02 DIAGNOSIS — M6281 Muscle weakness (generalized): Secondary | ICD-10-CM | POA: Diagnosis not present
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
## 2016-08-03 ENCOUNTER — Encounter (HOSPITAL_COMMUNITY)
Admission: RE | Admit: 2016-08-03 | Discharge: 2016-08-03 | Disposition: A | Discharge status: Home / Self Care | Payer: Medicare Other | Source: Skilled Nursing Facility | Attending: Internal Medicine | Admitting: Internal Medicine

## 2016-08-03 DIAGNOSIS — M19011 Primary osteoarthritis, right shoulder: Secondary | ICD-10-CM | POA: Diagnosis not present

## 2016-08-03 DIAGNOSIS — I4891 Unspecified atrial fibrillation: Secondary | ICD-10-CM | POA: Insufficient documentation

## 2016-08-03 DIAGNOSIS — I5042 Chronic combined systolic (congestive) and diastolic (congestive) heart failure: Secondary | ICD-10-CM | POA: Insufficient documentation

## 2016-08-03 DIAGNOSIS — M159 Polyosteoarthritis, unspecified: Secondary | ICD-10-CM | POA: Insufficient documentation

## 2016-08-03 DIAGNOSIS — F329 Major depressive disorder, single episode, unspecified: Secondary | ICD-10-CM | POA: Insufficient documentation

## 2016-08-03 DIAGNOSIS — M6281 Muscle weakness (generalized): Secondary | ICD-10-CM | POA: Diagnosis not present

## 2016-08-03 DIAGNOSIS — R279 Unspecified lack of coordination: Secondary | ICD-10-CM | POA: Diagnosis not present

## 2016-08-03 DIAGNOSIS — R488 Other symbolic dysfunctions: Secondary | ICD-10-CM | POA: Diagnosis not present

## 2016-08-03 LAB — BASIC METABOLIC PANEL
ANION GAP: 5 (ref 5–15)
BUN: 24 mg/dL — ABNORMAL HIGH (ref 6–20)
CO2: 31 mmol/L (ref 22–32)
Calcium: 8.2 mg/dL — ABNORMAL LOW (ref 8.9–10.3)
Chloride: 103 mmol/L (ref 101–111)
Creatinine, Ser: 0.84 mg/dL (ref 0.44–1.00)
GFR calc Af Amer: >60 mL/min (ref >60)
GFR calc non Af Amer: 57 mL/min — ABNORMAL LOW (ref >60)
GLUCOSE: 145 mg/dL — AB (ref 65–99)
Potassium: 3.5 mmol/L (ref 3.5–5.1)
Sodium: 139 mmol/L (ref 135–145)

## 2016-08-04 DIAGNOSIS — R488 Other symbolic dysfunctions: Secondary | ICD-10-CM | POA: Diagnosis not present

## 2016-08-04 DIAGNOSIS — M159 Polyosteoarthritis, unspecified: Secondary | ICD-10-CM | POA: Diagnosis not present

## 2016-08-04 DIAGNOSIS — M6281 Muscle weakness (generalized): Secondary | ICD-10-CM | POA: Diagnosis not present

## 2016-08-04 DIAGNOSIS — R279 Unspecified lack of coordination: Secondary | ICD-10-CM | POA: Diagnosis not present

## 2016-08-04 DIAGNOSIS — I5042 Chronic combined systolic (congestive) and diastolic (congestive) heart failure: Secondary | ICD-10-CM | POA: Diagnosis not present

## 2016-08-05 DIAGNOSIS — M159 Polyosteoarthritis, unspecified: Secondary | ICD-10-CM | POA: Diagnosis not present

## 2016-08-05 DIAGNOSIS — I5042 Chronic combined systolic (congestive) and diastolic (congestive) heart failure: Secondary | ICD-10-CM | POA: Diagnosis not present

## 2016-08-05 DIAGNOSIS — M6281 Muscle weakness (generalized): Secondary | ICD-10-CM | POA: Diagnosis not present

## 2016-08-05 DIAGNOSIS — R488 Other symbolic dysfunctions: Secondary | ICD-10-CM | POA: Diagnosis not present

## 2016-08-05 DIAGNOSIS — R279 Unspecified lack of coordination: Secondary | ICD-10-CM | POA: Diagnosis not present

## 2016-08-06 DIAGNOSIS — I5042 Chronic combined systolic (congestive) and diastolic (congestive) heart failure: Secondary | ICD-10-CM | POA: Diagnosis not present

## 2016-08-06 DIAGNOSIS — R279 Unspecified lack of coordination: Secondary | ICD-10-CM | POA: Diagnosis not present

## 2016-08-06 DIAGNOSIS — M6281 Muscle weakness (generalized): Secondary | ICD-10-CM | POA: Diagnosis not present

## 2016-08-06 DIAGNOSIS — R488 Other symbolic dysfunctions: Secondary | ICD-10-CM | POA: Diagnosis not present

## 2016-08-06 DIAGNOSIS — M159 Polyosteoarthritis, unspecified: Secondary | ICD-10-CM | POA: Diagnosis not present

## 2016-08-09 DIAGNOSIS — M159 Polyosteoarthritis, unspecified: Secondary | ICD-10-CM | POA: Diagnosis not present

## 2016-08-09 DIAGNOSIS — R488 Other symbolic dysfunctions: Secondary | ICD-10-CM | POA: Diagnosis not present

## 2016-08-09 DIAGNOSIS — R279 Unspecified lack of coordination: Secondary | ICD-10-CM | POA: Diagnosis not present

## 2016-08-09 DIAGNOSIS — M6281 Muscle weakness (generalized): Secondary | ICD-10-CM | POA: Diagnosis not present

## 2016-08-09 DIAGNOSIS — I5042 Chronic combined systolic (congestive) and diastolic (congestive) heart failure: Secondary | ICD-10-CM | POA: Diagnosis not present

## 2016-08-10 DIAGNOSIS — R279 Unspecified lack of coordination: Secondary | ICD-10-CM | POA: Diagnosis not present

## 2016-08-10 DIAGNOSIS — R488 Other symbolic dysfunctions: Secondary | ICD-10-CM | POA: Diagnosis not present

## 2016-08-10 DIAGNOSIS — M6281 Muscle weakness (generalized): Secondary | ICD-10-CM | POA: Diagnosis not present

## 2016-08-10 DIAGNOSIS — I5042 Chronic combined systolic (congestive) and diastolic (congestive) heart failure: Secondary | ICD-10-CM | POA: Diagnosis not present

## 2016-08-10 DIAGNOSIS — M159 Polyosteoarthritis, unspecified: Secondary | ICD-10-CM | POA: Diagnosis not present

## 2016-08-11 DIAGNOSIS — R488 Other symbolic dysfunctions: Secondary | ICD-10-CM | POA: Diagnosis not present

## 2016-08-11 DIAGNOSIS — M6281 Muscle weakness (generalized): Secondary | ICD-10-CM | POA: Diagnosis not present

## 2016-08-11 DIAGNOSIS — I5042 Chronic combined systolic (congestive) and diastolic (congestive) heart failure: Secondary | ICD-10-CM | POA: Diagnosis not present

## 2016-08-11 DIAGNOSIS — R279 Unspecified lack of coordination: Secondary | ICD-10-CM | POA: Diagnosis not present

## 2016-08-11 DIAGNOSIS — M159 Polyosteoarthritis, unspecified: Secondary | ICD-10-CM | POA: Diagnosis not present

## 2016-08-12 DIAGNOSIS — M6281 Muscle weakness (generalized): Secondary | ICD-10-CM | POA: Diagnosis not present

## 2016-08-12 DIAGNOSIS — R279 Unspecified lack of coordination: Secondary | ICD-10-CM | POA: Diagnosis not present

## 2016-08-12 DIAGNOSIS — M159 Polyosteoarthritis, unspecified: Secondary | ICD-10-CM | POA: Diagnosis not present

## 2016-08-12 DIAGNOSIS — R488 Other symbolic dysfunctions: Secondary | ICD-10-CM | POA: Diagnosis not present

## 2016-08-12 DIAGNOSIS — I5042 Chronic combined systolic (congestive) and diastolic (congestive) heart failure: Secondary | ICD-10-CM | POA: Diagnosis not present

## 2016-08-13 DIAGNOSIS — M6281 Muscle weakness (generalized): Secondary | ICD-10-CM | POA: Diagnosis not present

## 2016-08-13 DIAGNOSIS — R279 Unspecified lack of coordination: Secondary | ICD-10-CM | POA: Diagnosis not present

## 2016-08-13 DIAGNOSIS — M159 Polyosteoarthritis, unspecified: Secondary | ICD-10-CM | POA: Diagnosis not present

## 2016-08-13 DIAGNOSIS — I5042 Chronic combined systolic (congestive) and diastolic (congestive) heart failure: Secondary | ICD-10-CM | POA: Diagnosis not present

## 2016-08-13 DIAGNOSIS — R488 Other symbolic dysfunctions: Secondary | ICD-10-CM | POA: Diagnosis not present

## 2016-08-16 DIAGNOSIS — R488 Other symbolic dysfunctions: Secondary | ICD-10-CM | POA: Diagnosis not present

## 2016-08-16 DIAGNOSIS — M159 Polyosteoarthritis, unspecified: Secondary | ICD-10-CM | POA: Diagnosis not present

## 2016-08-16 DIAGNOSIS — R279 Unspecified lack of coordination: Secondary | ICD-10-CM | POA: Diagnosis not present

## 2016-08-16 DIAGNOSIS — I5042 Chronic combined systolic (congestive) and diastolic (congestive) heart failure: Secondary | ICD-10-CM | POA: Diagnosis not present

## 2016-08-16 DIAGNOSIS — M6281 Muscle weakness (generalized): Secondary | ICD-10-CM | POA: Diagnosis not present

## 2016-08-17 DIAGNOSIS — M159 Polyosteoarthritis, unspecified: Secondary | ICD-10-CM | POA: Diagnosis not present

## 2016-08-17 DIAGNOSIS — R279 Unspecified lack of coordination: Secondary | ICD-10-CM | POA: Diagnosis not present

## 2016-08-17 DIAGNOSIS — R488 Other symbolic dysfunctions: Secondary | ICD-10-CM | POA: Diagnosis not present

## 2016-08-17 DIAGNOSIS — I5042 Chronic combined systolic (congestive) and diastolic (congestive) heart failure: Secondary | ICD-10-CM | POA: Diagnosis not present

## 2016-08-17 DIAGNOSIS — M6281 Muscle weakness (generalized): Secondary | ICD-10-CM | POA: Diagnosis not present

## 2016-08-18 DIAGNOSIS — M6281 Muscle weakness (generalized): Secondary | ICD-10-CM | POA: Diagnosis not present

## 2016-08-18 DIAGNOSIS — F419 Anxiety disorder, unspecified: Secondary | ICD-10-CM | POA: Diagnosis not present

## 2016-08-18 DIAGNOSIS — I5042 Chronic combined systolic (congestive) and diastolic (congestive) heart failure: Secondary | ICD-10-CM | POA: Diagnosis not present

## 2016-08-18 DIAGNOSIS — M159 Polyosteoarthritis, unspecified: Secondary | ICD-10-CM | POA: Diagnosis not present

## 2016-08-18 DIAGNOSIS — F329 Major depressive disorder, single episode, unspecified: Secondary | ICD-10-CM | POA: Diagnosis not present

## 2016-08-18 DIAGNOSIS — R488 Other symbolic dysfunctions: Secondary | ICD-10-CM | POA: Diagnosis not present

## 2016-08-18 DIAGNOSIS — R279 Unspecified lack of coordination: Secondary | ICD-10-CM | POA: Diagnosis not present

## 2016-08-19 ENCOUNTER — Encounter (HOSPITAL_COMMUNITY)
Admission: RE | Admit: 2016-08-19 | Discharge: 2016-08-19 | Disposition: A | Discharge status: Home / Self Care | Payer: Medicare Other | Source: Skilled Nursing Facility | Attending: Internal Medicine | Admitting: Internal Medicine

## 2016-08-19 DIAGNOSIS — I5042 Chronic combined systolic (congestive) and diastolic (congestive) heart failure: Secondary | ICD-10-CM | POA: Diagnosis not present

## 2016-08-19 DIAGNOSIS — R279 Unspecified lack of coordination: Secondary | ICD-10-CM | POA: Insufficient documentation

## 2016-08-19 DIAGNOSIS — F329 Major depressive disorder, single episode, unspecified: Secondary | ICD-10-CM | POA: Diagnosis not present

## 2016-08-19 DIAGNOSIS — I270 Primary pulmonary hypertension: Secondary | ICD-10-CM | POA: Diagnosis not present

## 2016-08-19 DIAGNOSIS — R488 Other symbolic dysfunctions: Secondary | ICD-10-CM | POA: Diagnosis not present

## 2016-08-19 DIAGNOSIS — M6281 Muscle weakness (generalized): Secondary | ICD-10-CM | POA: Insufficient documentation

## 2016-08-19 DIAGNOSIS — M159 Polyosteoarthritis, unspecified: Secondary | ICD-10-CM | POA: Diagnosis not present

## 2016-08-19 LAB — BASIC METABOLIC PANEL
ANION GAP: 7 (ref 5–15)
BUN: 28 mg/dL — ABNORMAL HIGH (ref 6–20)
CALCIUM: 8.7 mg/dL — AB (ref 8.9–10.3)
CHLORIDE: 104 mmol/L (ref 101–111)
CO2: 29 mmol/L (ref 22–32)
CREATININE: 0.94 mg/dL (ref 0.44–1.00)
GFR calc non Af Amer: 50 mL/min — ABNORMAL LOW (ref >60)
GFR, EST AFRICAN AMERICAN: 57 mL/min — AB (ref >60)
Glucose, Bld: 95 mg/dL (ref 65–99)
Potassium: 4.2 mmol/L (ref 3.5–5.1)
SODIUM: 140 mmol/L (ref 135–145)

## 2016-08-19 LAB — PHENYTOIN LEVEL, TOTAL: PHENYTOIN LVL: 15.8 ug/mL (ref 10.0–20.0)

## 2016-08-19 LAB — TSH: TSH: 7.662 u[IU]/mL — ABNORMAL HIGH (ref 0.350–4.500)

## 2016-08-20 DIAGNOSIS — R488 Other symbolic dysfunctions: Secondary | ICD-10-CM | POA: Diagnosis not present

## 2016-08-20 DIAGNOSIS — M159 Polyosteoarthritis, unspecified: Secondary | ICD-10-CM | POA: Diagnosis not present

## 2016-08-20 DIAGNOSIS — M6281 Muscle weakness (generalized): Secondary | ICD-10-CM | POA: Diagnosis not present

## 2016-08-20 DIAGNOSIS — R279 Unspecified lack of coordination: Secondary | ICD-10-CM | POA: Diagnosis not present

## 2016-08-20 DIAGNOSIS — I5042 Chronic combined systolic (congestive) and diastolic (congestive) heart failure: Secondary | ICD-10-CM | POA: Diagnosis not present

## 2016-08-20 LAB — VITAMIN D 25 HYDROXY (VIT D DEFICIENCY, FRACTURES): Vit D, 25-Hydroxy: 33.8 ng/mL (ref 30.0–100.0)

## 2016-08-23 ENCOUNTER — Non-Acute Institutional Stay (SKILLED_NURSING_FACILITY): Payer: Medicare Other | Admitting: Internal Medicine

## 2016-08-23 ENCOUNTER — Encounter: Payer: Self-pay | Admitting: Internal Medicine

## 2016-08-23 DIAGNOSIS — M6281 Muscle weakness (generalized): Secondary | ICD-10-CM | POA: Diagnosis not present

## 2016-08-23 DIAGNOSIS — I4891 Unspecified atrial fibrillation: Secondary | ICD-10-CM | POA: Diagnosis not present

## 2016-08-23 DIAGNOSIS — R279 Unspecified lack of coordination: Secondary | ICD-10-CM | POA: Diagnosis not present

## 2016-08-23 DIAGNOSIS — E038 Other specified hypothyroidism: Secondary | ICD-10-CM | POA: Diagnosis not present

## 2016-08-23 DIAGNOSIS — E876 Hypokalemia: Secondary | ICD-10-CM | POA: Diagnosis not present

## 2016-08-23 DIAGNOSIS — I10 Essential (primary) hypertension: Secondary | ICD-10-CM | POA: Diagnosis not present

## 2016-08-23 DIAGNOSIS — R488 Other symbolic dysfunctions: Secondary | ICD-10-CM | POA: Diagnosis not present

## 2016-08-23 DIAGNOSIS — R21 Rash and other nonspecific skin eruption: Secondary | ICD-10-CM

## 2016-08-23 DIAGNOSIS — I5042 Chronic combined systolic (congestive) and diastolic (congestive) heart failure: Secondary | ICD-10-CM

## 2016-08-23 DIAGNOSIS — M159 Polyosteoarthritis, unspecified: Secondary | ICD-10-CM | POA: Diagnosis not present

## 2016-08-23 DIAGNOSIS — G40909 Epilepsy, unspecified, not intractable, without status epilepticus: Secondary | ICD-10-CM | POA: Diagnosis not present
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
## 2016-08-24 ENCOUNTER — Encounter (HOSPITAL_COMMUNITY)
Admission: RE | Admit: 2016-08-24 | Discharge: 2016-08-24 | Disposition: A | Discharge status: Home / Self Care | Payer: Medicare Other | Source: Skilled Nursing Facility | Attending: Internal Medicine | Admitting: Internal Medicine

## 2016-08-24 DIAGNOSIS — I5042 Chronic combined systolic (congestive) and diastolic (congestive) heart failure: Secondary | ICD-10-CM | POA: Diagnosis not present

## 2016-08-24 DIAGNOSIS — M159 Polyosteoarthritis, unspecified: Secondary | ICD-10-CM | POA: Diagnosis not present

## 2016-08-24 DIAGNOSIS — I270 Primary pulmonary hypertension: Secondary | ICD-10-CM | POA: Diagnosis not present

## 2016-08-24 DIAGNOSIS — R279 Unspecified lack of coordination: Secondary | ICD-10-CM | POA: Diagnosis not present

## 2016-08-24 DIAGNOSIS — F329 Major depressive disorder, single episode, unspecified: Secondary | ICD-10-CM | POA: Diagnosis not present

## 2016-08-24 DIAGNOSIS — M6281 Muscle weakness (generalized): Secondary | ICD-10-CM | POA: Diagnosis not present

## 2016-08-24 DIAGNOSIS — R488 Other symbolic dysfunctions: Secondary | ICD-10-CM | POA: Diagnosis not present

## 2016-08-24 LAB — URINALYSIS W MICROSCOPIC (NOT AT ARMC)
Bilirubin Urine: NEGATIVE
Glucose, UA: NEGATIVE mg/dL
Ketones, ur: NEGATIVE mg/dL
Nitrite: NEGATIVE
Protein, ur: NEGATIVE mg/dL
Specific Gravity, Urine: 1.01 (ref 1.005–1.030)
pH: 5.5 (ref 5.0–8.0)

## 2016-08-25 DIAGNOSIS — R279 Unspecified lack of coordination: Secondary | ICD-10-CM | POA: Diagnosis not present

## 2016-08-25 DIAGNOSIS — M159 Polyosteoarthritis, unspecified: Secondary | ICD-10-CM | POA: Diagnosis not present

## 2016-08-25 DIAGNOSIS — R488 Other symbolic dysfunctions: Secondary | ICD-10-CM | POA: Diagnosis not present

## 2016-08-25 DIAGNOSIS — M6281 Muscle weakness (generalized): Secondary | ICD-10-CM | POA: Diagnosis not present

## 2016-08-25 DIAGNOSIS — I5042 Chronic combined systolic (congestive) and diastolic (congestive) heart failure: Secondary | ICD-10-CM | POA: Diagnosis not present

## 2016-08-26 DIAGNOSIS — R488 Other symbolic dysfunctions: Secondary | ICD-10-CM | POA: Diagnosis not present

## 2016-08-26 DIAGNOSIS — I5042 Chronic combined systolic (congestive) and diastolic (congestive) heart failure: Secondary | ICD-10-CM | POA: Diagnosis not present

## 2016-08-26 DIAGNOSIS — R279 Unspecified lack of coordination: Secondary | ICD-10-CM | POA: Diagnosis not present

## 2016-08-26 DIAGNOSIS — M6281 Muscle weakness (generalized): Secondary | ICD-10-CM | POA: Diagnosis not present

## 2016-08-26 DIAGNOSIS — M159 Polyosteoarthritis, unspecified: Secondary | ICD-10-CM | POA: Diagnosis not present

## 2016-08-26 LAB — URINE CULTURE: Culture: >=100000 — AB

## 2016-08-30 DIAGNOSIS — R279 Unspecified lack of coordination: Secondary | ICD-10-CM | POA: Diagnosis not present

## 2016-08-30 DIAGNOSIS — R488 Other symbolic dysfunctions: Secondary | ICD-10-CM | POA: Diagnosis not present

## 2016-08-30 DIAGNOSIS — M159 Polyosteoarthritis, unspecified: Secondary | ICD-10-CM | POA: Diagnosis not present

## 2016-08-30 DIAGNOSIS — I5042 Chronic combined systolic (congestive) and diastolic (congestive) heart failure: Secondary | ICD-10-CM | POA: Diagnosis not present

## 2016-08-30 DIAGNOSIS — M6281 Muscle weakness (generalized): Secondary | ICD-10-CM | POA: Diagnosis not present

## 2016-08-31 DIAGNOSIS — I5042 Chronic combined systolic (congestive) and diastolic (congestive) heart failure: Secondary | ICD-10-CM | POA: Diagnosis not present

## 2016-08-31 DIAGNOSIS — R488 Other symbolic dysfunctions: Secondary | ICD-10-CM | POA: Diagnosis not present

## 2016-08-31 DIAGNOSIS — R279 Unspecified lack of coordination: Secondary | ICD-10-CM | POA: Diagnosis not present

## 2016-08-31 DIAGNOSIS — M159 Polyosteoarthritis, unspecified: Secondary | ICD-10-CM | POA: Diagnosis not present

## 2016-08-31 DIAGNOSIS — M6281 Muscle weakness (generalized): Secondary | ICD-10-CM | POA: Diagnosis not present

## 2016-09-01 DIAGNOSIS — I5042 Chronic combined systolic (congestive) and diastolic (congestive) heart failure: Secondary | ICD-10-CM | POA: Diagnosis not present

## 2016-09-01 DIAGNOSIS — M159 Polyosteoarthritis, unspecified: Secondary | ICD-10-CM | POA: Diagnosis not present

## 2016-09-01 DIAGNOSIS — R488 Other symbolic dysfunctions: Secondary | ICD-10-CM | POA: Diagnosis not present

## 2016-09-01 DIAGNOSIS — M6281 Muscle weakness (generalized): Secondary | ICD-10-CM | POA: Diagnosis not present

## 2016-09-01 DIAGNOSIS — R279 Unspecified lack of coordination: Secondary | ICD-10-CM | POA: Diagnosis not present

## 2016-09-03 ENCOUNTER — Encounter (HOSPITAL_COMMUNITY)
Admission: AD | Admit: 2016-09-03 | Discharge: 2016-09-03 | Disposition: A | Discharge status: Home / Self Care | Payer: Medicare Other | Source: Skilled Nursing Facility | Attending: Internal Medicine | Admitting: Internal Medicine

## 2016-09-03 DIAGNOSIS — F329 Major depressive disorder, single episode, unspecified: Secondary | ICD-10-CM | POA: Insufficient documentation

## 2016-09-03 DIAGNOSIS — I5042 Chronic combined systolic (congestive) and diastolic (congestive) heart failure: Secondary | ICD-10-CM | POA: Insufficient documentation

## 2016-09-03 DIAGNOSIS — M159 Polyosteoarthritis, unspecified: Secondary | ICD-10-CM | POA: Insufficient documentation

## 2016-09-03 DIAGNOSIS — I4891 Unspecified atrial fibrillation: Secondary | ICD-10-CM | POA: Diagnosis not present

## 2016-09-03 DIAGNOSIS — M19011 Primary osteoarthritis, right shoulder: Secondary | ICD-10-CM | POA: Diagnosis not present

## 2016-09-03 LAB — BASIC METABOLIC PANEL
ANION GAP: 7 (ref 5–15)
BUN: 26 mg/dL — ABNORMAL HIGH (ref 6–20)
CHLORIDE: 105 mmol/L (ref 101–111)
CO2: 27 mmol/L (ref 22–32)
CREATININE: 1.02 mg/dL — AB (ref 0.44–1.00)
Calcium: 8.6 mg/dL — ABNORMAL LOW (ref 8.9–10.3)
GFR calc non Af Amer: 45 mL/min — ABNORMAL LOW (ref >60)
GFR, EST AFRICAN AMERICAN: 52 mL/min — AB (ref >60)
Glucose, Bld: 103 mg/dL — ABNORMAL HIGH (ref 65–99)
Potassium: 4 mmol/L (ref 3.5–5.1)
Sodium: 139 mmol/L (ref 135–145)

## 2016-09-06 DIAGNOSIS — R488 Other symbolic dysfunctions: Secondary | ICD-10-CM | POA: Diagnosis not present

## 2016-09-06 DIAGNOSIS — R279 Unspecified lack of coordination: Secondary | ICD-10-CM | POA: Diagnosis not present

## 2016-09-06 DIAGNOSIS — I5042 Chronic combined systolic (congestive) and diastolic (congestive) heart failure: Secondary | ICD-10-CM | POA: Diagnosis not present

## 2016-09-06 DIAGNOSIS — M159 Polyosteoarthritis, unspecified: Secondary | ICD-10-CM | POA: Diagnosis not present

## 2016-09-06 DIAGNOSIS — M6281 Muscle weakness (generalized): Secondary | ICD-10-CM | POA: Diagnosis not present

## 2016-09-07 DIAGNOSIS — M159 Polyosteoarthritis, unspecified: Secondary | ICD-10-CM | POA: Diagnosis not present

## 2016-09-07 DIAGNOSIS — R279 Unspecified lack of coordination: Secondary | ICD-10-CM | POA: Diagnosis not present

## 2016-09-07 DIAGNOSIS — M6281 Muscle weakness (generalized): Secondary | ICD-10-CM | POA: Diagnosis not present

## 2016-09-07 DIAGNOSIS — I5042 Chronic combined systolic (congestive) and diastolic (congestive) heart failure: Secondary | ICD-10-CM | POA: Diagnosis not present

## 2016-09-07 DIAGNOSIS — R488 Other symbolic dysfunctions: Secondary | ICD-10-CM | POA: Diagnosis not present

## 2016-09-30 ENCOUNTER — Encounter (HOSPITAL_COMMUNITY)
Admission: RE | Admit: 2016-09-30 | Discharge: 2016-09-30 | Disposition: A | Discharge status: Home / Self Care | Payer: Medicare Other | Source: Skilled Nursing Facility | Attending: Internal Medicine | Admitting: Internal Medicine

## 2016-09-30 DIAGNOSIS — M159 Polyosteoarthritis, unspecified: Secondary | ICD-10-CM | POA: Insufficient documentation

## 2016-09-30 DIAGNOSIS — E039 Hypothyroidism, unspecified: Secondary | ICD-10-CM | POA: Insufficient documentation

## 2016-09-30 DIAGNOSIS — F329 Major depressive disorder, single episode, unspecified: Secondary | ICD-10-CM | POA: Insufficient documentation

## 2016-09-30 DIAGNOSIS — I5042 Chronic combined systolic (congestive) and diastolic (congestive) heart failure: Secondary | ICD-10-CM | POA: Insufficient documentation

## 2016-09-30 DIAGNOSIS — M19011 Primary osteoarthritis, right shoulder: Secondary | ICD-10-CM | POA: Insufficient documentation

## 2016-10-01 ENCOUNTER — Encounter (HOSPITAL_COMMUNITY)
Admission: RE | Admit: 2016-10-01 | Discharge: 2016-10-01 | Disposition: A | Discharge status: Home / Self Care | Payer: Medicare Other | Source: Skilled Nursing Facility | Attending: Internal Medicine | Admitting: Internal Medicine

## 2016-10-01 DIAGNOSIS — F329 Major depressive disorder, single episode, unspecified: Secondary | ICD-10-CM | POA: Insufficient documentation

## 2016-10-01 DIAGNOSIS — M159 Polyosteoarthritis, unspecified: Secondary | ICD-10-CM | POA: Insufficient documentation

## 2016-10-01 DIAGNOSIS — E039 Hypothyroidism, unspecified: Secondary | ICD-10-CM | POA: Insufficient documentation

## 2016-10-01 DIAGNOSIS — M19011 Primary osteoarthritis, right shoulder: Secondary | ICD-10-CM | POA: Insufficient documentation

## 2016-10-01 DIAGNOSIS — I5042 Chronic combined systolic (congestive) and diastolic (congestive) heart failure: Secondary | ICD-10-CM | POA: Insufficient documentation

## 2016-10-05 ENCOUNTER — Encounter (HOSPITAL_COMMUNITY)
Admission: RE | Admit: 2016-10-05 | Discharge: 2016-10-05 | Disposition: A | Discharge status: Home / Self Care | Payer: Medicare Other | Source: Skilled Nursing Facility | Attending: Internal Medicine | Admitting: Internal Medicine

## 2016-10-07 DIAGNOSIS — M79674 Pain in right toe(s): Secondary | ICD-10-CM | POA: Diagnosis not present

## 2016-10-07 DIAGNOSIS — I739 Peripheral vascular disease, unspecified: Secondary | ICD-10-CM | POA: Diagnosis not present

## 2016-10-07 DIAGNOSIS — M79675 Pain in left toe(s): Secondary | ICD-10-CM | POA: Diagnosis not present

## 2016-10-07 DIAGNOSIS — B351 Tinea unguium: Secondary | ICD-10-CM | POA: Diagnosis not present

## 2016-10-29 ENCOUNTER — Encounter: Payer: Self-pay | Admitting: Internal Medicine

## 2016-10-29 ENCOUNTER — Non-Acute Institutional Stay (SKILLED_NURSING_FACILITY): Payer: Medicare Other | Admitting: Internal Medicine

## 2016-10-29 DIAGNOSIS — I878 Other specified disorders of veins: Secondary | ICD-10-CM | POA: Diagnosis not present

## 2016-10-29 DIAGNOSIS — I5042 Chronic combined systolic (congestive) and diastolic (congestive) heart failure: Secondary | ICD-10-CM

## 2016-10-29 DIAGNOSIS — E038 Other specified hypothyroidism: Secondary | ICD-10-CM | POA: Diagnosis not present

## 2016-10-29 DIAGNOSIS — I4891 Unspecified atrial fibrillation: Secondary | ICD-10-CM | POA: Diagnosis not present
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
## 2016-11-01 ENCOUNTER — Encounter (HOSPITAL_COMMUNITY)
Admission: RE | Admit: 2016-11-01 | Discharge: 2016-11-01 | Disposition: A | Discharge status: Home / Self Care | Payer: Medicare Other | Source: Skilled Nursing Facility | Attending: Internal Medicine | Admitting: Internal Medicine

## 2016-11-01 DIAGNOSIS — F329 Major depressive disorder, single episode, unspecified: Secondary | ICD-10-CM | POA: Insufficient documentation

## 2016-11-01 DIAGNOSIS — M19011 Primary osteoarthritis, right shoulder: Secondary | ICD-10-CM | POA: Diagnosis not present

## 2016-11-01 DIAGNOSIS — I5042 Chronic combined systolic (congestive) and diastolic (congestive) heart failure: Secondary | ICD-10-CM | POA: Diagnosis not present

## 2016-11-01 DIAGNOSIS — M159 Polyosteoarthritis, unspecified: Secondary | ICD-10-CM | POA: Insufficient documentation

## 2016-11-01 LAB — PHENYTOIN LEVEL, TOTAL: PHENYTOIN LVL: 14.3 ug/mL (ref 10.0–20.0)

## 2016-11-01 LAB — BASIC METABOLIC PANEL
ANION GAP: 7 (ref 5–15)
BUN: 27 mg/dL — ABNORMAL HIGH (ref 6–20)
CALCIUM: 8.4 mg/dL — AB (ref 8.9–10.3)
CO2: 29 mmol/L (ref 22–32)
Chloride: 103 mmol/L (ref 101–111)
Creatinine, Ser: 0.84 mg/dL (ref 0.44–1.00)
GFR, EST NON AFRICAN AMERICAN: 56 mL/min — AB (ref >60)
Glucose, Bld: 99 mg/dL (ref 65–99)
Potassium: 3.3 mmol/L — ABNORMAL LOW (ref 3.5–5.1)
SODIUM: 139 mmol/L (ref 135–145)

## 2016-11-01 LAB — CBC
HCT: 36.9 % (ref 36.0–46.0)
Hemoglobin: 12.3 g/dL (ref 12.0–15.0)
MCH: 32.7 pg (ref 26.0–34.0)
MCHC: 33.3 g/dL (ref 30.0–36.0)
MCV: 98.1 fL (ref 78.0–100.0)
PLATELETS: 152 10*3/uL (ref 150–400)
RBC: 3.76 MIL/uL — AB (ref 3.87–5.11)
RDW: 13.5 % (ref 11.5–15.5)
WBC: 4.3 10*3/uL (ref 4.0–10.5)

## 2016-11-01 LAB — TSH: TSH: 1.231 u[IU]/mL (ref 0.350–4.500)

## 2016-11-05 ENCOUNTER — Encounter (HOSPITAL_COMMUNITY)
Admission: RE | Admit: 2016-11-05 | Discharge: 2016-11-05 | Disposition: A | Discharge status: Home / Self Care | Payer: Medicare Other | Source: Skilled Nursing Facility | Attending: Pediatrics | Admitting: Pediatrics

## 2016-11-05 DIAGNOSIS — I5042 Chronic combined systolic (congestive) and diastolic (congestive) heart failure: Secondary | ICD-10-CM | POA: Diagnosis not present

## 2016-11-05 DIAGNOSIS — M19011 Primary osteoarthritis, right shoulder: Secondary | ICD-10-CM | POA: Insufficient documentation

## 2016-11-05 DIAGNOSIS — F329 Major depressive disorder, single episode, unspecified: Secondary | ICD-10-CM | POA: Insufficient documentation

## 2016-11-05 DIAGNOSIS — M159 Polyosteoarthritis, unspecified: Secondary | ICD-10-CM | POA: Diagnosis not present

## 2016-11-05 LAB — BASIC METABOLIC PANEL
ANION GAP: 5 (ref 5–15)
BUN: 21 mg/dL — ABNORMAL HIGH (ref 6–20)
CO2: 31 mmol/L (ref 22–32)
Calcium: 9 mg/dL (ref 8.9–10.3)
Chloride: 103 mmol/L (ref 101–111)
Creatinine, Ser: 0.81 mg/dL (ref 0.44–1.00)
GFR, EST NON AFRICAN AMERICAN: 59 mL/min — AB (ref >60)
Glucose, Bld: 127 mg/dL — ABNORMAL HIGH (ref 65–99)
POTASSIUM: 4.2 mmol/L (ref 3.5–5.1)
SODIUM: 139 mmol/L (ref 135–145)

## 2016-11-11 ENCOUNTER — Non-Acute Institutional Stay (SKILLED_NURSING_FACILITY): Payer: Medicare Other | Admitting: Internal Medicine

## 2016-11-11 ENCOUNTER — Encounter: Payer: Self-pay | Admitting: Internal Medicine

## 2016-11-11 DIAGNOSIS — E038 Other specified hypothyroidism: Secondary | ICD-10-CM

## 2016-11-11 DIAGNOSIS — I5042 Chronic combined systolic (congestive) and diastolic (congestive) heart failure: Secondary | ICD-10-CM

## 2016-11-11 DIAGNOSIS — L989 Disorder of the skin and subcutaneous tissue, unspecified: Secondary | ICD-10-CM | POA: Diagnosis not present

## 2016-11-11 DIAGNOSIS — I4891 Unspecified atrial fibrillation: Secondary | ICD-10-CM

## 2016-11-11 DIAGNOSIS — G40909 Epilepsy, unspecified, not intractable, without status epilepticus: Secondary | ICD-10-CM | POA: Diagnosis not present
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
## 2016-11-12 ENCOUNTER — Encounter (HOSPITAL_COMMUNITY)
Admission: RE | Admit: 2016-11-12 | Discharge: 2016-11-12 | Disposition: A | Discharge status: Home / Self Care | Payer: Medicare Other | Source: Skilled Nursing Facility | Attending: Pediatrics | Admitting: Pediatrics

## 2016-11-12 DIAGNOSIS — M159 Polyosteoarthritis, unspecified: Secondary | ICD-10-CM | POA: Diagnosis not present

## 2016-11-12 DIAGNOSIS — I5042 Chronic combined systolic (congestive) and diastolic (congestive) heart failure: Secondary | ICD-10-CM | POA: Insufficient documentation

## 2016-11-12 DIAGNOSIS — M19011 Primary osteoarthritis, right shoulder: Secondary | ICD-10-CM | POA: Insufficient documentation

## 2016-11-12 DIAGNOSIS — F329 Major depressive disorder, single episode, unspecified: Secondary | ICD-10-CM | POA: Insufficient documentation

## 2016-11-15 ENCOUNTER — Other Ambulatory Visit (HOSPITAL_COMMUNITY)
Admission: AD | Admit: 2016-11-15 | Discharge: 2016-11-15 | Disposition: A | Discharge status: Home / Self Care | Payer: Medicare Other | Source: Skilled Nursing Facility | Attending: Internal Medicine | Admitting: Internal Medicine

## 2016-11-15 ENCOUNTER — Encounter (HOSPITAL_COMMUNITY)
Admission: RE | Admit: 2016-11-15 | Discharge: 2016-11-15 | Disposition: A | Discharge status: Home / Self Care | Payer: Medicare Other | Source: Skilled Nursing Facility | Attending: Internal Medicine | Admitting: Internal Medicine

## 2016-11-15 DIAGNOSIS — M159 Polyosteoarthritis, unspecified: Secondary | ICD-10-CM | POA: Insufficient documentation

## 2016-11-15 DIAGNOSIS — I5042 Chronic combined systolic (congestive) and diastolic (congestive) heart failure: Secondary | ICD-10-CM | POA: Insufficient documentation

## 2016-11-15 DIAGNOSIS — M19011 Primary osteoarthritis, right shoulder: Secondary | ICD-10-CM | POA: Diagnosis not present

## 2016-11-15 DIAGNOSIS — F329 Major depressive disorder, single episode, unspecified: Secondary | ICD-10-CM | POA: Diagnosis not present

## 2016-11-15 LAB — BASIC METABOLIC PANEL
Anion gap: 7 (ref 5–15)
BUN: 26 mg/dL — ABNORMAL HIGH (ref 6–20)
CHLORIDE: 98 mmol/L — AB (ref 101–111)
CO2: 32 mmol/L (ref 22–32)
Calcium: 9 mg/dL (ref 8.9–10.3)
Creatinine, Ser: 0.85 mg/dL (ref 0.44–1.00)
GFR calc non Af Amer: 56 mL/min — ABNORMAL LOW (ref >60)
Glucose, Bld: 102 mg/dL — ABNORMAL HIGH (ref 65–99)
POTASSIUM: 3.9 mmol/L (ref 3.5–5.1)
Sodium: 137 mmol/L (ref 135–145)

## 2016-11-16 DIAGNOSIS — M159 Polyosteoarthritis, unspecified: Secondary | ICD-10-CM | POA: Diagnosis not present

## 2016-11-16 DIAGNOSIS — I5042 Chronic combined systolic (congestive) and diastolic (congestive) heart failure: Secondary | ICD-10-CM | POA: Diagnosis not present

## 2016-11-17 DIAGNOSIS — M159 Polyosteoarthritis, unspecified: Secondary | ICD-10-CM | POA: Diagnosis not present

## 2016-11-17 DIAGNOSIS — I5042 Chronic combined systolic (congestive) and diastolic (congestive) heart failure: Secondary | ICD-10-CM | POA: Diagnosis not present

## 2016-11-19 DIAGNOSIS — I5042 Chronic combined systolic (congestive) and diastolic (congestive) heart failure: Secondary | ICD-10-CM | POA: Diagnosis not present

## 2016-11-19 DIAGNOSIS — M159 Polyosteoarthritis, unspecified: Secondary | ICD-10-CM | POA: Diagnosis not present

## 2016-11-23 DIAGNOSIS — I5042 Chronic combined systolic (congestive) and diastolic (congestive) heart failure: Secondary | ICD-10-CM | POA: Diagnosis not present

## 2016-11-23 DIAGNOSIS — M159 Polyosteoarthritis, unspecified: Secondary | ICD-10-CM | POA: Diagnosis not present

## 2016-11-24 DIAGNOSIS — I5042 Chronic combined systolic (congestive) and diastolic (congestive) heart failure: Secondary | ICD-10-CM | POA: Diagnosis not present

## 2016-11-24 DIAGNOSIS — M159 Polyosteoarthritis, unspecified: Secondary | ICD-10-CM | POA: Diagnosis not present

## 2016-11-25 DIAGNOSIS — L570 Actinic keratosis: Secondary | ICD-10-CM | POA: Diagnosis not present

## 2016-11-25 DIAGNOSIS — C44311 Basal cell carcinoma of skin of nose: Secondary | ICD-10-CM | POA: Diagnosis not present

## 2016-11-25 DIAGNOSIS — X32XXXD Exposure to sunlight, subsequent encounter: Secondary | ICD-10-CM | POA: Diagnosis not present

## 2016-11-25 DIAGNOSIS — I5042 Chronic combined systolic (congestive) and diastolic (congestive) heart failure: Secondary | ICD-10-CM | POA: Diagnosis not present

## 2016-11-25 DIAGNOSIS — C44319 Basal cell carcinoma of skin of other parts of face: Secondary | ICD-10-CM | POA: Diagnosis not present

## 2016-11-25 DIAGNOSIS — M159 Polyosteoarthritis, unspecified: Secondary | ICD-10-CM | POA: Diagnosis not present

## 2016-11-26 DIAGNOSIS — I5042 Chronic combined systolic (congestive) and diastolic (congestive) heart failure: Secondary | ICD-10-CM | POA: Diagnosis not present

## 2016-11-26 DIAGNOSIS — M159 Polyosteoarthritis, unspecified: Secondary | ICD-10-CM | POA: Diagnosis not present

## 2016-11-29 DIAGNOSIS — I5042 Chronic combined systolic (congestive) and diastolic (congestive) heart failure: Secondary | ICD-10-CM | POA: Diagnosis not present

## 2016-11-29 DIAGNOSIS — M159 Polyosteoarthritis, unspecified: Secondary | ICD-10-CM | POA: Diagnosis not present

## 2016-12-01 DIAGNOSIS — M159 Polyosteoarthritis, unspecified: Secondary | ICD-10-CM | POA: Diagnosis not present

## 2016-12-01 DIAGNOSIS — I5042 Chronic combined systolic (congestive) and diastolic (congestive) heart failure: Secondary | ICD-10-CM | POA: Diagnosis not present

## 2016-12-03 DIAGNOSIS — I5042 Chronic combined systolic (congestive) and diastolic (congestive) heart failure: Secondary | ICD-10-CM | POA: Diagnosis not present

## 2016-12-03 DIAGNOSIS — M159 Polyosteoarthritis, unspecified: Secondary | ICD-10-CM | POA: Diagnosis not present

## 2016-12-06 DIAGNOSIS — M159 Polyosteoarthritis, unspecified: Secondary | ICD-10-CM | POA: Diagnosis not present

## 2016-12-06 DIAGNOSIS — I5042 Chronic combined systolic (congestive) and diastolic (congestive) heart failure: Secondary | ICD-10-CM | POA: Diagnosis not present

## 2017-01-05 DIAGNOSIS — F329 Major depressive disorder, single episode, unspecified: Secondary | ICD-10-CM | POA: Diagnosis not present

## 2017-01-05 DIAGNOSIS — F419 Anxiety disorder, unspecified: Secondary | ICD-10-CM | POA: Diagnosis not present

## 2017-01-19 DIAGNOSIS — F329 Major depressive disorder, single episode, unspecified: Secondary | ICD-10-CM | POA: Diagnosis not present

## 2017-01-19 DIAGNOSIS — F419 Anxiety disorder, unspecified: Secondary | ICD-10-CM | POA: Diagnosis not present

## 2017-01-20 ENCOUNTER — Non-Acute Institutional Stay (SKILLED_NURSING_FACILITY): Payer: Medicare Other | Admitting: Internal Medicine

## 2017-01-20 ENCOUNTER — Encounter: Payer: Self-pay | Admitting: Internal Medicine

## 2017-01-20 DIAGNOSIS — G40909 Epilepsy, unspecified, not intractable, without status epilepticus: Secondary | ICD-10-CM

## 2017-01-20 DIAGNOSIS — E876 Hypokalemia: Secondary | ICD-10-CM | POA: Diagnosis not present

## 2017-01-20 DIAGNOSIS — E038 Other specified hypothyroidism: Secondary | ICD-10-CM

## 2017-01-20 DIAGNOSIS — I5042 Chronic combined systolic (congestive) and diastolic (congestive) heart failure: Secondary | ICD-10-CM | POA: Diagnosis not present

## 2017-01-20 DIAGNOSIS — I4891 Unspecified atrial fibrillation: Secondary | ICD-10-CM | POA: Diagnosis not present

## 2017-01-20 DIAGNOSIS — I10 Essential (primary) hypertension: Secondary | ICD-10-CM | POA: Diagnosis not present
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
## 2017-02-07 DIAGNOSIS — F329 Major depressive disorder, single episode, unspecified: Secondary | ICD-10-CM | POA: Diagnosis not present

## 2017-02-07 DIAGNOSIS — F419 Anxiety disorder, unspecified: Secondary | ICD-10-CM | POA: Diagnosis not present

## 2017-02-15 DIAGNOSIS — I739 Peripheral vascular disease, unspecified: Secondary | ICD-10-CM | POA: Diagnosis not present

## 2017-02-15 DIAGNOSIS — B351 Tinea unguium: Secondary | ICD-10-CM | POA: Diagnosis not present

## 2017-02-16 ENCOUNTER — Encounter (HOSPITAL_COMMUNITY)
Admission: AD | Admit: 2017-02-16 | Discharge: 2017-02-16 | Disposition: A | Discharge status: Home / Self Care | Payer: Medicare Other | Source: Skilled Nursing Facility | Attending: Pediatrics | Admitting: Pediatrics

## 2017-02-16 DIAGNOSIS — F329 Major depressive disorder, single episode, unspecified: Secondary | ICD-10-CM | POA: Insufficient documentation

## 2017-02-16 DIAGNOSIS — I5042 Chronic combined systolic (congestive) and diastolic (congestive) heart failure: Secondary | ICD-10-CM | POA: Insufficient documentation

## 2017-02-16 DIAGNOSIS — M19011 Primary osteoarthritis, right shoulder: Secondary | ICD-10-CM | POA: Insufficient documentation

## 2017-02-16 DIAGNOSIS — M159 Polyosteoarthritis, unspecified: Secondary | ICD-10-CM | POA: Insufficient documentation

## 2017-03-09 ENCOUNTER — Encounter: Payer: Self-pay | Admitting: Internal Medicine
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
## 2017-03-15 ENCOUNTER — Encounter (HOSPITAL_COMMUNITY)
Admission: RE | Admit: 2017-03-15 | Discharge: 2017-03-15 | Disposition: A | Discharge status: Home / Self Care | Payer: Medicare Other | Source: Skilled Nursing Facility | Attending: Internal Medicine | Admitting: Internal Medicine

## 2017-03-15 ENCOUNTER — Encounter: Payer: Self-pay | Admitting: Internal Medicine

## 2017-03-15 ENCOUNTER — Non-Acute Institutional Stay (SKILLED_NURSING_FACILITY): Payer: Medicare Other | Admitting: Internal Medicine

## 2017-03-15 DIAGNOSIS — M159 Polyosteoarthritis, unspecified: Secondary | ICD-10-CM | POA: Insufficient documentation

## 2017-03-15 DIAGNOSIS — I10 Essential (primary) hypertension: Secondary | ICD-10-CM

## 2017-03-15 DIAGNOSIS — M19011 Primary osteoarthritis, right shoulder: Secondary | ICD-10-CM | POA: Diagnosis not present

## 2017-03-15 DIAGNOSIS — I5042 Chronic combined systolic (congestive) and diastolic (congestive) heart failure: Secondary | ICD-10-CM

## 2017-03-15 DIAGNOSIS — F329 Major depressive disorder, single episode, unspecified: Secondary | ICD-10-CM | POA: Insufficient documentation

## 2017-03-15 DIAGNOSIS — I4891 Unspecified atrial fibrillation: Secondary | ICD-10-CM

## 2017-03-15 DIAGNOSIS — G40909 Epilepsy, unspecified, not intractable, without status epilepticus: Secondary | ICD-10-CM | POA: Diagnosis not present

## 2017-03-15 LAB — BASIC METABOLIC PANEL
ANION GAP: 8 (ref 5–15)
BUN: 28 mg/dL — ABNORMAL HIGH (ref 6–20)
CALCIUM: 8.7 mg/dL — AB (ref 8.9–10.3)
CO2: 29 mmol/L (ref 22–32)
Chloride: 103 mmol/L (ref 101–111)
Creatinine, Ser: 0.73 mg/dL (ref 0.44–1.00)
GLUCOSE: 104 mg/dL — AB (ref 65–99)
POTASSIUM: 4.1 mmol/L (ref 3.5–5.1)
Sodium: 140 mmol/L (ref 135–145)
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
## 2017-04-08 ENCOUNTER — Encounter (HOSPITAL_COMMUNITY)
Admission: RE | Admit: 2017-04-08 | Discharge: 2017-04-08 | Disposition: A | Discharge status: Home / Self Care | Payer: Medicare Other | Source: Skilled Nursing Facility | Attending: Internal Medicine | Admitting: Internal Medicine

## 2017-04-08 DIAGNOSIS — M159 Polyosteoarthritis, unspecified: Secondary | ICD-10-CM | POA: Diagnosis not present

## 2017-04-08 DIAGNOSIS — M19011 Primary osteoarthritis, right shoulder: Secondary | ICD-10-CM | POA: Insufficient documentation

## 2017-04-08 DIAGNOSIS — I5042 Chronic combined systolic (congestive) and diastolic (congestive) heart failure: Secondary | ICD-10-CM | POA: Diagnosis not present

## 2017-04-08 DIAGNOSIS — F329 Major depressive disorder, single episode, unspecified: Secondary | ICD-10-CM | POA: Diagnosis not present

## 2017-04-08 LAB — CBC WITH DIFFERENTIAL/PLATELET
Basophils Absolute: 0 10*3/uL (ref 0.0–0.1)
Basophils Relative: 0 %
EOS PCT: 4 %
Eosinophils Absolute: 0.2 10*3/uL (ref 0.0–0.7)
HEMATOCRIT: 38.8 % (ref 36.0–46.0)
Hemoglobin: 12.9 g/dL (ref 12.0–15.0)
Lymphocytes Relative: 26 %
Lymphs Abs: 1.1 10*3/uL (ref 0.7–4.0)
MCH: 32.3 pg (ref 26.0–34.0)
MCHC: 33.2 g/dL (ref 30.0–36.0)
MCV: 97.2 fL (ref 78.0–100.0)
MONO ABS: 0.5 10*3/uL (ref 0.1–1.0)
MONOS PCT: 13 %
Neutro Abs: 2.3 10*3/uL (ref 1.7–7.7)
Neutrophils Relative %: 57 %
PLATELETS: 151 10*3/uL (ref 150–400)
RBC: 3.99 MIL/uL (ref 3.87–5.11)
RDW: 13.4 % (ref 11.5–15.5)
WBC: 4 10*3/uL (ref 4.0–10.5)

## 2017-04-08 LAB — PHENYTOIN LEVEL, TOTAL: Phenytoin Lvl: 16.4 ug/mL (ref 10.0–20.0)

## 2017-04-08 LAB — TSH: TSH: 0.201 u[IU]/mL — ABNORMAL LOW (ref 0.350–4.500)

## 2017-04-21 ENCOUNTER — Encounter: Payer: Self-pay | Admitting: Internal Medicine

## 2017-04-21 ENCOUNTER — Non-Acute Institutional Stay (SKILLED_NURSING_FACILITY): Payer: Medicare Other | Admitting: Internal Medicine

## 2017-04-21 DIAGNOSIS — E038 Other specified hypothyroidism: Secondary | ICD-10-CM

## 2017-04-21 DIAGNOSIS — I4891 Unspecified atrial fibrillation: Secondary | ICD-10-CM

## 2017-04-21 DIAGNOSIS — F329 Major depressive disorder, single episode, unspecified: Secondary | ICD-10-CM | POA: Insufficient documentation

## 2017-04-21 DIAGNOSIS — F419 Anxiety disorder, unspecified: Secondary | ICD-10-CM | POA: Insufficient documentation

## 2017-04-21 DIAGNOSIS — I5042 Chronic combined systolic (congestive) and diastolic (congestive) heart failure: Secondary | ICD-10-CM

## 2017-04-21 DIAGNOSIS — G40909 Epilepsy, unspecified, not intractable, without status epilepticus: Secondary | ICD-10-CM | POA: Diagnosis not present
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
## 2017-04-30 DIAGNOSIS — M25551 Pain in right hip: Secondary | ICD-10-CM | POA: Diagnosis not present

## 2017-05-02 ENCOUNTER — Encounter: Payer: Self-pay | Admitting: Internal Medicine

## 2017-05-02 ENCOUNTER — Ambulatory Visit (HOSPITAL_COMMUNITY): Payer: Medicare Other | Attending: Internal Medicine

## 2017-05-02 ENCOUNTER — Non-Acute Institutional Stay (SKILLED_NURSING_FACILITY): Payer: Medicare Other | Admitting: Internal Medicine

## 2017-05-02 DIAGNOSIS — S32512A Fracture of superior rim of left pubis, initial encounter for closed fracture: Secondary | ICD-10-CM | POA: Diagnosis not present

## 2017-05-02 DIAGNOSIS — X58XXXA Exposure to other specified factors, initial encounter: Secondary | ICD-10-CM | POA: Insufficient documentation

## 2017-05-02 DIAGNOSIS — M25551 Pain in right hip: Secondary | ICD-10-CM | POA: Diagnosis present

## 2017-05-02 DIAGNOSIS — M1612 Unilateral primary osteoarthritis, left hip: Secondary | ICD-10-CM | POA: Insufficient documentation

## 2017-05-02 DIAGNOSIS — S32511A Fracture of superior rim of right pubis, initial encounter for closed fracture: Secondary | ICD-10-CM | POA: Diagnosis not present

## 2017-05-02 DIAGNOSIS — S3282XA Multiple fractures of pelvis without disruption of pelvic ring, initial encounter for closed fracture: Secondary | ICD-10-CM
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
## 2017-05-03 ENCOUNTER — Encounter (HOSPITAL_COMMUNITY)
Admission: RE | Admit: 2017-05-03 | Discharge: 2017-05-03 | Disposition: A | Discharge status: Home / Self Care | Payer: Medicare Other | Source: Skilled Nursing Facility | Attending: *Deleted | Admitting: *Deleted

## 2017-05-03 DIAGNOSIS — I5042 Chronic combined systolic (congestive) and diastolic (congestive) heart failure: Secondary | ICD-10-CM | POA: Insufficient documentation

## 2017-05-03 DIAGNOSIS — M159 Polyosteoarthritis, unspecified: Secondary | ICD-10-CM | POA: Insufficient documentation

## 2017-05-03 DIAGNOSIS — M19011 Primary osteoarthritis, right shoulder: Secondary | ICD-10-CM | POA: Insufficient documentation

## 2017-05-03 DIAGNOSIS — F329 Major depressive disorder, single episode, unspecified: Secondary | ICD-10-CM | POA: Insufficient documentation

## 2017-05-04 DIAGNOSIS — M19011 Primary osteoarthritis, right shoulder: Secondary | ICD-10-CM | POA: Diagnosis not present

## 2017-05-04 DIAGNOSIS — M6281 Muscle weakness (generalized): Secondary | ICD-10-CM | POA: Diagnosis not present

## 2017-05-04 DIAGNOSIS — R269 Unspecified abnormalities of gait and mobility: Secondary | ICD-10-CM | POA: Diagnosis not present

## 2017-05-04 DIAGNOSIS — I5042 Chronic combined systolic (congestive) and diastolic (congestive) heart failure: Secondary | ICD-10-CM | POA: Diagnosis not present

## 2017-05-04 DIAGNOSIS — R293 Abnormal posture: Secondary | ICD-10-CM | POA: Diagnosis not present

## 2017-05-05 DIAGNOSIS — M6281 Muscle weakness (generalized): Secondary | ICD-10-CM | POA: Diagnosis not present

## 2017-05-05 DIAGNOSIS — R269 Unspecified abnormalities of gait and mobility: Secondary | ICD-10-CM | POA: Diagnosis not present

## 2017-05-05 DIAGNOSIS — M19011 Primary osteoarthritis, right shoulder: Secondary | ICD-10-CM | POA: Diagnosis not present

## 2017-05-05 DIAGNOSIS — R293 Abnormal posture: Secondary | ICD-10-CM | POA: Diagnosis not present

## 2017-05-05 DIAGNOSIS — I5042 Chronic combined systolic (congestive) and diastolic (congestive) heart failure: Secondary | ICD-10-CM | POA: Diagnosis not present

## 2017-05-09 ENCOUNTER — Encounter (HOSPITAL_COMMUNITY)
Admission: RE | Admit: 2017-05-09 | Discharge: 2017-05-09 | Disposition: A | Discharge status: Home / Self Care | Payer: Medicare Other | Source: Skilled Nursing Facility | Attending: *Deleted | Admitting: *Deleted

## 2017-05-09 DIAGNOSIS — I5042 Chronic combined systolic (congestive) and diastolic (congestive) heart failure: Secondary | ICD-10-CM | POA: Diagnosis not present

## 2017-05-09 DIAGNOSIS — R269 Unspecified abnormalities of gait and mobility: Secondary | ICD-10-CM | POA: Diagnosis not present

## 2017-05-09 DIAGNOSIS — M19011 Primary osteoarthritis, right shoulder: Secondary | ICD-10-CM | POA: Diagnosis not present

## 2017-05-09 DIAGNOSIS — R293 Abnormal posture: Secondary | ICD-10-CM | POA: Diagnosis not present

## 2017-05-09 DIAGNOSIS — M6281 Muscle weakness (generalized): Secondary | ICD-10-CM | POA: Diagnosis not present

## 2017-05-10 ENCOUNTER — Encounter: Payer: Self-pay | Admitting: Internal Medicine

## 2017-05-10 DIAGNOSIS — M6281 Muscle weakness (generalized): Secondary | ICD-10-CM | POA: Diagnosis not present

## 2017-05-10 DIAGNOSIS — M19011 Primary osteoarthritis, right shoulder: Secondary | ICD-10-CM | POA: Diagnosis not present

## 2017-05-10 DIAGNOSIS — R293 Abnormal posture: Secondary | ICD-10-CM | POA: Diagnosis not present

## 2017-05-10 DIAGNOSIS — I5042 Chronic combined systolic (congestive) and diastolic (congestive) heart failure: Secondary | ICD-10-CM | POA: Diagnosis not present

## 2017-05-10 DIAGNOSIS — R269 Unspecified abnormalities of gait and mobility: Secondary | ICD-10-CM | POA: Diagnosis not present
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
## 2017-05-11 DIAGNOSIS — M19011 Primary osteoarthritis, right shoulder: Secondary | ICD-10-CM | POA: Diagnosis not present

## 2017-05-11 DIAGNOSIS — M6281 Muscle weakness (generalized): Secondary | ICD-10-CM | POA: Diagnosis not present

## 2017-05-11 DIAGNOSIS — R488 Other symbolic dysfunctions: Secondary | ICD-10-CM | POA: Diagnosis not present

## 2017-05-11 DIAGNOSIS — R269 Unspecified abnormalities of gait and mobility: Secondary | ICD-10-CM | POA: Diagnosis not present

## 2017-05-11 DIAGNOSIS — R293 Abnormal posture: Secondary | ICD-10-CM | POA: Diagnosis not present

## 2017-05-11 DIAGNOSIS — I5042 Chronic combined systolic (congestive) and diastolic (congestive) heart failure: Secondary | ICD-10-CM | POA: Diagnosis not present

## 2017-05-11 DIAGNOSIS — R1310 Dysphagia, unspecified: Secondary | ICD-10-CM | POA: Diagnosis not present

## 2017-05-12 ENCOUNTER — Encounter (HOSPITAL_COMMUNITY)
Admission: RE | Admit: 2017-05-12 | Discharge: 2017-05-12 | Disposition: A | Discharge status: Home / Self Care | Payer: Medicare Other | Source: Skilled Nursing Facility | Attending: Pediatrics | Admitting: Pediatrics

## 2017-05-12 DIAGNOSIS — M6281 Muscle weakness (generalized): Secondary | ICD-10-CM | POA: Diagnosis not present

## 2017-05-12 DIAGNOSIS — R1310 Dysphagia, unspecified: Secondary | ICD-10-CM | POA: Diagnosis not present

## 2017-05-12 DIAGNOSIS — M19011 Primary osteoarthritis, right shoulder: Secondary | ICD-10-CM | POA: Diagnosis not present

## 2017-05-12 DIAGNOSIS — M159 Polyosteoarthritis, unspecified: Secondary | ICD-10-CM | POA: Insufficient documentation

## 2017-05-12 DIAGNOSIS — F329 Major depressive disorder, single episode, unspecified: Secondary | ICD-10-CM | POA: Insufficient documentation

## 2017-05-12 DIAGNOSIS — R293 Abnormal posture: Secondary | ICD-10-CM | POA: Diagnosis not present

## 2017-05-12 DIAGNOSIS — R269 Unspecified abnormalities of gait and mobility: Secondary | ICD-10-CM | POA: Diagnosis not present

## 2017-05-12 DIAGNOSIS — I5042 Chronic combined systolic (congestive) and diastolic (congestive) heart failure: Secondary | ICD-10-CM | POA: Diagnosis not present

## 2017-05-12 LAB — CBC WITH DIFFERENTIAL/PLATELET
BASOS ABS: 0 10*3/uL (ref 0.0–0.1)
Basophils Relative: 1 %
Eosinophils Absolute: 0.2 10*3/uL (ref 0.0–0.7)
Eosinophils Relative: 3 %
HEMATOCRIT: 42.9 % (ref 36.0–46.0)
HEMOGLOBIN: 14.3 g/dL (ref 12.0–15.0)
LYMPHS PCT: 22 %
Lymphs Abs: 1.3 10*3/uL (ref 0.7–4.0)
MCH: 32.4 pg (ref 26.0–34.0)
MCHC: 33.3 g/dL (ref 30.0–36.0)
MCV: 97.3 fL (ref 78.0–100.0)
MONO ABS: 0.7 10*3/uL (ref 0.1–1.0)
Monocytes Relative: 12 %
NEUTROS ABS: 3.8 10*3/uL (ref 1.7–7.7)
Neutrophils Relative %: 62 %
Platelets: 187 10*3/uL (ref 150–400)
RBC: 4.41 MIL/uL (ref 3.87–5.11)
RDW: 13.5 % (ref 11.5–15.5)
WBC: 6 10*3/uL (ref 4.0–10.5)

## 2017-05-12 LAB — COMPREHENSIVE METABOLIC PANEL
ALBUMIN: 3.4 g/dL — AB (ref 3.5–5.0)
ALK PHOS: 345 U/L — AB (ref 38–126)
ALT: 27 U/L (ref 14–54)
AST: 31 U/L (ref 15–41)
Anion gap: 7 (ref 5–15)
BILIRUBIN TOTAL: 0.9 mg/dL (ref 0.3–1.2)
BUN: 19 mg/dL (ref 6–20)
CALCIUM: 8.9 mg/dL (ref 8.9–10.3)
CO2: 28 mmol/L (ref 22–32)
CREATININE: 0.75 mg/dL (ref 0.44–1.00)
Chloride: 103 mmol/L (ref 101–111)
GFR calc Af Amer: >60 mL/min (ref >60)
GFR calc non Af Amer: >60 mL/min (ref >60)
Glucose, Bld: 113 mg/dL — ABNORMAL HIGH (ref 65–99)
Potassium: 4.7 mmol/L (ref 3.5–5.1)
Sodium: 138 mmol/L (ref 135–145)
TOTAL PROTEIN: 7 g/dL (ref 6.5–8.1)

## 2017-05-13 ENCOUNTER — Encounter: Payer: Self-pay | Admitting: Internal Medicine

## 2017-05-13 ENCOUNTER — Non-Acute Institutional Stay (SKILLED_NURSING_FACILITY): Payer: Medicare Other | Admitting: Internal Medicine

## 2017-05-13 ENCOUNTER — Encounter (HOSPITAL_COMMUNITY)
Admission: RE | Admit: 2017-05-13 | Discharge: 2017-05-13 | Disposition: A | Discharge status: Home / Self Care | Payer: Medicare Other | Source: Skilled Nursing Facility | Attending: *Deleted | Admitting: *Deleted

## 2017-05-13 DIAGNOSIS — R634 Abnormal weight loss: Secondary | ICD-10-CM

## 2017-05-13 DIAGNOSIS — M19011 Primary osteoarthritis, right shoulder: Secondary | ICD-10-CM | POA: Diagnosis not present

## 2017-05-13 DIAGNOSIS — R293 Abnormal posture: Secondary | ICD-10-CM | POA: Diagnosis not present

## 2017-05-13 DIAGNOSIS — M6281 Muscle weakness (generalized): Secondary | ICD-10-CM | POA: Diagnosis not present

## 2017-05-13 DIAGNOSIS — M159 Polyosteoarthritis, unspecified: Secondary | ICD-10-CM | POA: Diagnosis not present

## 2017-05-13 DIAGNOSIS — F329 Major depressive disorder, single episode, unspecified: Secondary | ICD-10-CM | POA: Diagnosis not present

## 2017-05-13 DIAGNOSIS — R1310 Dysphagia, unspecified: Secondary | ICD-10-CM | POA: Diagnosis not present

## 2017-05-13 DIAGNOSIS — I5042 Chronic combined systolic (congestive) and diastolic (congestive) heart failure: Secondary | ICD-10-CM | POA: Diagnosis not present

## 2017-05-13 DIAGNOSIS — R269 Unspecified abnormalities of gait and mobility: Secondary | ICD-10-CM | POA: Diagnosis not present

## 2017-05-13 LAB — TSH: TSH: 0.227 u[IU]/mL — AB (ref 0.350–4.500)
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
## 2017-05-15 NOTE — Progress Notes (Signed)
This encounter was created in error - please disregard.

## 2017-05-16 ENCOUNTER — Encounter (HOSPITAL_COMMUNITY)
Admission: RE | Admit: 2017-05-16 | Discharge: 2017-05-16 | Disposition: A | Discharge status: Home / Self Care | Payer: Medicare Other | Source: Skilled Nursing Facility | Attending: *Deleted | Admitting: *Deleted

## 2017-05-16 DIAGNOSIS — R269 Unspecified abnormalities of gait and mobility: Secondary | ICD-10-CM | POA: Diagnosis not present

## 2017-05-16 DIAGNOSIS — F329 Major depressive disorder, single episode, unspecified: Secondary | ICD-10-CM | POA: Diagnosis not present

## 2017-05-16 DIAGNOSIS — R293 Abnormal posture: Secondary | ICD-10-CM | POA: Diagnosis not present

## 2017-05-16 DIAGNOSIS — M6281 Muscle weakness (generalized): Secondary | ICD-10-CM | POA: Diagnosis not present

## 2017-05-16 DIAGNOSIS — I5042 Chronic combined systolic (congestive) and diastolic (congestive) heart failure: Secondary | ICD-10-CM | POA: Diagnosis not present

## 2017-05-16 DIAGNOSIS — R1310 Dysphagia, unspecified: Secondary | ICD-10-CM | POA: Diagnosis not present

## 2017-05-16 DIAGNOSIS — M159 Polyosteoarthritis, unspecified: Secondary | ICD-10-CM | POA: Diagnosis not present

## 2017-05-16 DIAGNOSIS — M19011 Primary osteoarthritis, right shoulder: Secondary | ICD-10-CM | POA: Diagnosis not present

## 2017-05-16 LAB — PHOSPHORUS: PHOSPHORUS: 3.7 mg/dL (ref 2.5–4.6)

## 2017-05-17 DIAGNOSIS — M6281 Muscle weakness (generalized): Secondary | ICD-10-CM | POA: Diagnosis not present

## 2017-05-17 DIAGNOSIS — I5042 Chronic combined systolic (congestive) and diastolic (congestive) heart failure: Secondary | ICD-10-CM | POA: Diagnosis not present

## 2017-05-17 DIAGNOSIS — R293 Abnormal posture: Secondary | ICD-10-CM | POA: Diagnosis not present

## 2017-05-17 DIAGNOSIS — R269 Unspecified abnormalities of gait and mobility: Secondary | ICD-10-CM | POA: Diagnosis not present

## 2017-05-17 DIAGNOSIS — R1310 Dysphagia, unspecified: Secondary | ICD-10-CM | POA: Diagnosis not present

## 2017-05-17 DIAGNOSIS — M19011 Primary osteoarthritis, right shoulder: Secondary | ICD-10-CM | POA: Diagnosis not present

## 2017-05-17 LAB — HEMOGLOBIN A1C
Hgb A1c MFr Bld: 5.5 % (ref 4.8–5.6)
Mean Plasma Glucose: 111 mg/dL

## 2017-05-18 DIAGNOSIS — M19011 Primary osteoarthritis, right shoulder: Secondary | ICD-10-CM | POA: Diagnosis not present

## 2017-05-18 DIAGNOSIS — R1310 Dysphagia, unspecified: Secondary | ICD-10-CM | POA: Diagnosis not present

## 2017-05-18 DIAGNOSIS — R293 Abnormal posture: Secondary | ICD-10-CM | POA: Diagnosis not present

## 2017-05-18 DIAGNOSIS — I5042 Chronic combined systolic (congestive) and diastolic (congestive) heart failure: Secondary | ICD-10-CM | POA: Diagnosis not present

## 2017-05-18 DIAGNOSIS — R269 Unspecified abnormalities of gait and mobility: Secondary | ICD-10-CM | POA: Diagnosis not present

## 2017-05-18 DIAGNOSIS — M6281 Muscle weakness (generalized): Secondary | ICD-10-CM | POA: Diagnosis not present

## 2017-05-19 DIAGNOSIS — R293 Abnormal posture: Secondary | ICD-10-CM | POA: Diagnosis not present

## 2017-05-19 DIAGNOSIS — M6281 Muscle weakness (generalized): Secondary | ICD-10-CM | POA: Diagnosis not present

## 2017-05-19 DIAGNOSIS — I5042 Chronic combined systolic (congestive) and diastolic (congestive) heart failure: Secondary | ICD-10-CM | POA: Diagnosis not present

## 2017-05-19 DIAGNOSIS — R269 Unspecified abnormalities of gait and mobility: Secondary | ICD-10-CM | POA: Diagnosis not present

## 2017-05-19 DIAGNOSIS — M19011 Primary osteoarthritis, right shoulder: Secondary | ICD-10-CM | POA: Diagnosis not present

## 2017-05-19 DIAGNOSIS — R1310 Dysphagia, unspecified: Secondary | ICD-10-CM | POA: Diagnosis not present

## 2017-05-20 DIAGNOSIS — M6281 Muscle weakness (generalized): Secondary | ICD-10-CM | POA: Diagnosis not present

## 2017-05-20 DIAGNOSIS — R269 Unspecified abnormalities of gait and mobility: Secondary | ICD-10-CM | POA: Diagnosis not present

## 2017-05-20 DIAGNOSIS — R1310 Dysphagia, unspecified: Secondary | ICD-10-CM | POA: Diagnosis not present

## 2017-05-20 DIAGNOSIS — M19011 Primary osteoarthritis, right shoulder: Secondary | ICD-10-CM | POA: Diagnosis not present

## 2017-05-20 DIAGNOSIS — I5042 Chronic combined systolic (congestive) and diastolic (congestive) heart failure: Secondary | ICD-10-CM | POA: Diagnosis not present

## 2017-05-20 DIAGNOSIS — R293 Abnormal posture: Secondary | ICD-10-CM | POA: Diagnosis not present

## 2017-05-23 DIAGNOSIS — M19011 Primary osteoarthritis, right shoulder: Secondary | ICD-10-CM | POA: Diagnosis not present

## 2017-05-23 DIAGNOSIS — M6281 Muscle weakness (generalized): Secondary | ICD-10-CM | POA: Diagnosis not present

## 2017-05-23 DIAGNOSIS — R269 Unspecified abnormalities of gait and mobility: Secondary | ICD-10-CM | POA: Diagnosis not present

## 2017-05-23 DIAGNOSIS — I5042 Chronic combined systolic (congestive) and diastolic (congestive) heart failure: Secondary | ICD-10-CM | POA: Diagnosis not present

## 2017-05-23 DIAGNOSIS — R293 Abnormal posture: Secondary | ICD-10-CM | POA: Diagnosis not present

## 2017-05-23 DIAGNOSIS — R1310 Dysphagia, unspecified: Secondary | ICD-10-CM | POA: Diagnosis not present

## 2017-05-24 DIAGNOSIS — R293 Abnormal posture: Secondary | ICD-10-CM | POA: Diagnosis not present

## 2017-05-24 DIAGNOSIS — R269 Unspecified abnormalities of gait and mobility: Secondary | ICD-10-CM | POA: Diagnosis not present

## 2017-05-24 DIAGNOSIS — I5042 Chronic combined systolic (congestive) and diastolic (congestive) heart failure: Secondary | ICD-10-CM | POA: Diagnosis not present

## 2017-05-24 DIAGNOSIS — M6281 Muscle weakness (generalized): Secondary | ICD-10-CM | POA: Diagnosis not present

## 2017-05-24 DIAGNOSIS — M19011 Primary osteoarthritis, right shoulder: Secondary | ICD-10-CM | POA: Diagnosis not present

## 2017-05-24 DIAGNOSIS — R1310 Dysphagia, unspecified: Secondary | ICD-10-CM | POA: Diagnosis not present

## 2017-05-25 DIAGNOSIS — R293 Abnormal posture: Secondary | ICD-10-CM | POA: Diagnosis not present

## 2017-05-25 DIAGNOSIS — R269 Unspecified abnormalities of gait and mobility: Secondary | ICD-10-CM | POA: Diagnosis not present

## 2017-05-25 DIAGNOSIS — I5042 Chronic combined systolic (congestive) and diastolic (congestive) heart failure: Secondary | ICD-10-CM | POA: Diagnosis not present

## 2017-05-25 DIAGNOSIS — M19011 Primary osteoarthritis, right shoulder: Secondary | ICD-10-CM | POA: Diagnosis not present

## 2017-05-25 DIAGNOSIS — R1310 Dysphagia, unspecified: Secondary | ICD-10-CM | POA: Diagnosis not present

## 2017-05-25 DIAGNOSIS — M6281 Muscle weakness (generalized): Secondary | ICD-10-CM | POA: Diagnosis not present

## 2017-05-26 DIAGNOSIS — R293 Abnormal posture: Secondary | ICD-10-CM | POA: Diagnosis not present

## 2017-05-26 DIAGNOSIS — R1310 Dysphagia, unspecified: Secondary | ICD-10-CM | POA: Diagnosis not present

## 2017-05-26 DIAGNOSIS — M6281 Muscle weakness (generalized): Secondary | ICD-10-CM | POA: Diagnosis not present

## 2017-05-26 DIAGNOSIS — R269 Unspecified abnormalities of gait and mobility: Secondary | ICD-10-CM | POA: Diagnosis not present

## 2017-05-26 DIAGNOSIS — M19011 Primary osteoarthritis, right shoulder: Secondary | ICD-10-CM | POA: Diagnosis not present

## 2017-05-26 DIAGNOSIS — I5042 Chronic combined systolic (congestive) and diastolic (congestive) heart failure: Secondary | ICD-10-CM | POA: Diagnosis not present

## 2017-05-27 DIAGNOSIS — R1310 Dysphagia, unspecified: Secondary | ICD-10-CM | POA: Diagnosis not present

## 2017-05-27 DIAGNOSIS — R293 Abnormal posture: Secondary | ICD-10-CM | POA: Diagnosis not present

## 2017-05-27 DIAGNOSIS — M6281 Muscle weakness (generalized): Secondary | ICD-10-CM | POA: Diagnosis not present

## 2017-05-27 DIAGNOSIS — M19011 Primary osteoarthritis, right shoulder: Secondary | ICD-10-CM | POA: Diagnosis not present

## 2017-05-27 DIAGNOSIS — R269 Unspecified abnormalities of gait and mobility: Secondary | ICD-10-CM | POA: Diagnosis not present

## 2017-05-27 DIAGNOSIS — I5042 Chronic combined systolic (congestive) and diastolic (congestive) heart failure: Secondary | ICD-10-CM | POA: Diagnosis not present

## 2017-05-30 DIAGNOSIS — M19011 Primary osteoarthritis, right shoulder: Secondary | ICD-10-CM | POA: Diagnosis not present

## 2017-05-30 DIAGNOSIS — M6281 Muscle weakness (generalized): Secondary | ICD-10-CM | POA: Diagnosis not present

## 2017-05-30 DIAGNOSIS — R293 Abnormal posture: Secondary | ICD-10-CM | POA: Diagnosis not present

## 2017-05-30 DIAGNOSIS — R1310 Dysphagia, unspecified: Secondary | ICD-10-CM | POA: Diagnosis not present

## 2017-05-30 DIAGNOSIS — I5042 Chronic combined systolic (congestive) and diastolic (congestive) heart failure: Secondary | ICD-10-CM | POA: Diagnosis not present

## 2017-05-30 DIAGNOSIS — R269 Unspecified abnormalities of gait and mobility: Secondary | ICD-10-CM | POA: Diagnosis not present

## 2017-05-31 DIAGNOSIS — R269 Unspecified abnormalities of gait and mobility: Secondary | ICD-10-CM | POA: Diagnosis not present

## 2017-05-31 DIAGNOSIS — M6281 Muscle weakness (generalized): Secondary | ICD-10-CM | POA: Diagnosis not present

## 2017-05-31 DIAGNOSIS — I5042 Chronic combined systolic (congestive) and diastolic (congestive) heart failure: Secondary | ICD-10-CM | POA: Diagnosis not present

## 2017-05-31 DIAGNOSIS — M19011 Primary osteoarthritis, right shoulder: Secondary | ICD-10-CM | POA: Diagnosis not present

## 2017-05-31 DIAGNOSIS — R1310 Dysphagia, unspecified: Secondary | ICD-10-CM | POA: Diagnosis not present

## 2017-05-31 DIAGNOSIS — R293 Abnormal posture: Secondary | ICD-10-CM | POA: Diagnosis not present

## 2017-06-01 DIAGNOSIS — M19011 Primary osteoarthritis, right shoulder: Secondary | ICD-10-CM | POA: Diagnosis not present

## 2017-06-01 DIAGNOSIS — M6281 Muscle weakness (generalized): Secondary | ICD-10-CM | POA: Diagnosis not present

## 2017-06-01 DIAGNOSIS — R293 Abnormal posture: Secondary | ICD-10-CM | POA: Diagnosis not present

## 2017-06-01 DIAGNOSIS — R269 Unspecified abnormalities of gait and mobility: Secondary | ICD-10-CM | POA: Diagnosis not present

## 2017-06-01 DIAGNOSIS — R1310 Dysphagia, unspecified: Secondary | ICD-10-CM | POA: Diagnosis not present

## 2017-06-01 DIAGNOSIS — I5042 Chronic combined systolic (congestive) and diastolic (congestive) heart failure: Secondary | ICD-10-CM | POA: Diagnosis not present

## 2017-06-02 DIAGNOSIS — R1310 Dysphagia, unspecified: Secondary | ICD-10-CM | POA: Diagnosis not present

## 2017-06-02 DIAGNOSIS — I5042 Chronic combined systolic (congestive) and diastolic (congestive) heart failure: Secondary | ICD-10-CM | POA: Diagnosis not present

## 2017-06-02 DIAGNOSIS — R293 Abnormal posture: Secondary | ICD-10-CM | POA: Diagnosis not present

## 2017-06-02 DIAGNOSIS — M6281 Muscle weakness (generalized): Secondary | ICD-10-CM | POA: Diagnosis not present

## 2017-06-02 DIAGNOSIS — M19011 Primary osteoarthritis, right shoulder: Secondary | ICD-10-CM | POA: Diagnosis not present

## 2017-06-02 DIAGNOSIS — R269 Unspecified abnormalities of gait and mobility: Secondary | ICD-10-CM | POA: Diagnosis not present

## 2017-06-03 DIAGNOSIS — M6281 Muscle weakness (generalized): Secondary | ICD-10-CM | POA: Diagnosis not present

## 2017-06-03 DIAGNOSIS — I5042 Chronic combined systolic (congestive) and diastolic (congestive) heart failure: Secondary | ICD-10-CM | POA: Diagnosis not present

## 2017-06-03 DIAGNOSIS — M19011 Primary osteoarthritis, right shoulder: Secondary | ICD-10-CM | POA: Diagnosis not present

## 2017-06-03 DIAGNOSIS — R293 Abnormal posture: Secondary | ICD-10-CM | POA: Diagnosis not present

## 2017-06-03 DIAGNOSIS — R1310 Dysphagia, unspecified: Secondary | ICD-10-CM | POA: Diagnosis not present

## 2017-06-03 DIAGNOSIS — R269 Unspecified abnormalities of gait and mobility: Secondary | ICD-10-CM | POA: Diagnosis not present

## 2017-06-06 ENCOUNTER — Encounter: Payer: Self-pay | Admitting: Internal Medicine

## 2017-06-06 ENCOUNTER — Non-Acute Institutional Stay (SKILLED_NURSING_FACILITY): Payer: Medicare Other | Admitting: Internal Medicine

## 2017-06-06 DIAGNOSIS — M6281 Muscle weakness (generalized): Secondary | ICD-10-CM | POA: Diagnosis not present

## 2017-06-06 DIAGNOSIS — I5042 Chronic combined systolic (congestive) and diastolic (congestive) heart failure: Secondary | ICD-10-CM | POA: Diagnosis not present

## 2017-06-06 DIAGNOSIS — R269 Unspecified abnormalities of gait and mobility: Secondary | ICD-10-CM | POA: Diagnosis not present

## 2017-06-06 DIAGNOSIS — I10 Essential (primary) hypertension: Secondary | ICD-10-CM

## 2017-06-06 DIAGNOSIS — I4891 Unspecified atrial fibrillation: Secondary | ICD-10-CM

## 2017-06-06 DIAGNOSIS — R1310 Dysphagia, unspecified: Secondary | ICD-10-CM | POA: Diagnosis not present

## 2017-06-06 DIAGNOSIS — E038 Other specified hypothyroidism: Secondary | ICD-10-CM | POA: Diagnosis not present

## 2017-06-06 DIAGNOSIS — G40909 Epilepsy, unspecified, not intractable, without status epilepticus: Secondary | ICD-10-CM

## 2017-06-06 DIAGNOSIS — M19011 Primary osteoarthritis, right shoulder: Secondary | ICD-10-CM | POA: Diagnosis not present

## 2017-06-06 DIAGNOSIS — M533 Sacrococcygeal disorders, not elsewhere classified: Secondary | ICD-10-CM | POA: Diagnosis not present

## 2017-06-06 DIAGNOSIS — R102 Pelvic and perineal pain: Secondary | ICD-10-CM | POA: Diagnosis not present

## 2017-06-06 DIAGNOSIS — R293 Abnormal posture: Secondary | ICD-10-CM | POA: Diagnosis not present
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
## 2017-06-07 ENCOUNTER — Encounter (HOSPITAL_COMMUNITY)
Admission: RE | Admit: 2017-06-07 | Discharge: 2017-06-07 | Disposition: A | Discharge status: Home / Self Care | Payer: Medicare Other | Source: Skilled Nursing Facility | Attending: Internal Medicine | Admitting: Internal Medicine

## 2017-06-07 DIAGNOSIS — R269 Unspecified abnormalities of gait and mobility: Secondary | ICD-10-CM | POA: Insufficient documentation

## 2017-06-07 DIAGNOSIS — I5042 Chronic combined systolic (congestive) and diastolic (congestive) heart failure: Secondary | ICD-10-CM | POA: Diagnosis not present

## 2017-06-07 DIAGNOSIS — D509 Iron deficiency anemia, unspecified: Secondary | ICD-10-CM | POA: Diagnosis not present

## 2017-06-07 DIAGNOSIS — M19011 Primary osteoarthritis, right shoulder: Secondary | ICD-10-CM | POA: Diagnosis not present

## 2017-06-07 DIAGNOSIS — R293 Abnormal posture: Secondary | ICD-10-CM | POA: Insufficient documentation

## 2017-06-07 DIAGNOSIS — M6281 Muscle weakness (generalized): Secondary | ICD-10-CM | POA: Diagnosis not present

## 2017-06-07 DIAGNOSIS — R1310 Dysphagia, unspecified: Secondary | ICD-10-CM | POA: Insufficient documentation

## 2017-06-07 LAB — HEPATIC FUNCTION PANEL
ALT: 15 U/L (ref 14–54)
AST: 21 U/L (ref 15–41)
Albumin: 3 g/dL — ABNORMAL LOW (ref 3.5–5.0)
Alkaline Phosphatase: 174 U/L — ABNORMAL HIGH (ref 38–126)
BILIRUBIN INDIRECT: 0.3 mg/dL (ref 0.3–0.9)
Bilirubin, Direct: 0.1 mg/dL (ref 0.1–0.5)
TOTAL PROTEIN: 5.8 g/dL — AB (ref 6.5–8.1)
Total Bilirubin: 0.4 mg/dL (ref 0.3–1.2)

## 2017-06-08 DIAGNOSIS — R1310 Dysphagia, unspecified: Secondary | ICD-10-CM | POA: Diagnosis not present

## 2017-06-08 DIAGNOSIS — M533 Sacrococcygeal disorders, not elsewhere classified: Secondary | ICD-10-CM | POA: Diagnosis not present

## 2017-06-08 DIAGNOSIS — M19011 Primary osteoarthritis, right shoulder: Secondary | ICD-10-CM | POA: Diagnosis not present

## 2017-06-08 DIAGNOSIS — R293 Abnormal posture: Secondary | ICD-10-CM | POA: Diagnosis not present

## 2017-06-08 DIAGNOSIS — W19XXXA Unspecified fall, initial encounter: Secondary | ICD-10-CM | POA: Diagnosis not present

## 2017-06-08 DIAGNOSIS — M6281 Muscle weakness (generalized): Secondary | ICD-10-CM | POA: Diagnosis not present

## 2017-06-08 DIAGNOSIS — R269 Unspecified abnormalities of gait and mobility: Secondary | ICD-10-CM | POA: Diagnosis not present

## 2017-06-08 DIAGNOSIS — I5042 Chronic combined systolic (congestive) and diastolic (congestive) heart failure: Secondary | ICD-10-CM | POA: Diagnosis not present

## 2017-06-08 DIAGNOSIS — R102 Pelvic and perineal pain: Secondary | ICD-10-CM | POA: Diagnosis not present

## 2017-06-08 LAB — VITAMIN D 25 HYDROXY (VIT D DEFICIENCY, FRACTURES): Vit D, 25-Hydroxy: 27.1 ng/mL — ABNORMAL LOW (ref 30.0–100.0)

## 2017-06-09 ENCOUNTER — Non-Acute Institutional Stay (SKILLED_NURSING_FACILITY): Payer: Medicare Other | Admitting: Internal Medicine

## 2017-06-09 ENCOUNTER — Encounter: Payer: Self-pay | Admitting: Internal Medicine

## 2017-06-09 DIAGNOSIS — R748 Abnormal levels of other serum enzymes: Secondary | ICD-10-CM

## 2017-06-09 DIAGNOSIS — R293 Abnormal posture: Secondary | ICD-10-CM | POA: Diagnosis not present

## 2017-06-09 DIAGNOSIS — E559 Vitamin D deficiency, unspecified: Secondary | ICD-10-CM | POA: Diagnosis not present

## 2017-06-09 DIAGNOSIS — M6281 Muscle weakness (generalized): Secondary | ICD-10-CM | POA: Diagnosis not present

## 2017-06-09 DIAGNOSIS — S32592A Other specified fracture of left pubis, initial encounter for closed fracture: Secondary | ICD-10-CM | POA: Diagnosis not present

## 2017-06-09 DIAGNOSIS — R269 Unspecified abnormalities of gait and mobility: Secondary | ICD-10-CM | POA: Diagnosis not present

## 2017-06-09 DIAGNOSIS — R1310 Dysphagia, unspecified: Secondary | ICD-10-CM | POA: Diagnosis not present

## 2017-06-09 DIAGNOSIS — M19011 Primary osteoarthritis, right shoulder: Secondary | ICD-10-CM | POA: Diagnosis not present

## 2017-06-09 DIAGNOSIS — I5042 Chronic combined systolic (congestive) and diastolic (congestive) heart failure: Secondary | ICD-10-CM | POA: Diagnosis not present
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
## 2017-06-10 DIAGNOSIS — R293 Abnormal posture: Secondary | ICD-10-CM | POA: Diagnosis not present

## 2017-06-10 DIAGNOSIS — M6281 Muscle weakness (generalized): Secondary | ICD-10-CM | POA: Diagnosis not present

## 2017-06-10 DIAGNOSIS — R269 Unspecified abnormalities of gait and mobility: Secondary | ICD-10-CM | POA: Diagnosis not present

## 2017-06-10 DIAGNOSIS — M19011 Primary osteoarthritis, right shoulder: Secondary | ICD-10-CM | POA: Diagnosis not present

## 2017-06-10 DIAGNOSIS — R1310 Dysphagia, unspecified: Secondary | ICD-10-CM | POA: Diagnosis not present

## 2017-06-10 DIAGNOSIS — I5042 Chronic combined systolic (congestive) and diastolic (congestive) heart failure: Secondary | ICD-10-CM | POA: Diagnosis not present

## 2017-06-13 DIAGNOSIS — R293 Abnormal posture: Secondary | ICD-10-CM | POA: Diagnosis not present

## 2017-06-13 DIAGNOSIS — R488 Other symbolic dysfunctions: Secondary | ICD-10-CM | POA: Diagnosis not present

## 2017-06-13 DIAGNOSIS — R1310 Dysphagia, unspecified: Secondary | ICD-10-CM | POA: Diagnosis not present

## 2017-06-13 DIAGNOSIS — R269 Unspecified abnormalities of gait and mobility: Secondary | ICD-10-CM | POA: Diagnosis not present

## 2017-06-13 DIAGNOSIS — M19011 Primary osteoarthritis, right shoulder: Secondary | ICD-10-CM | POA: Diagnosis not present

## 2017-06-13 DIAGNOSIS — I5042 Chronic combined systolic (congestive) and diastolic (congestive) heart failure: Secondary | ICD-10-CM | POA: Diagnosis not present

## 2017-06-13 DIAGNOSIS — M6281 Muscle weakness (generalized): Secondary | ICD-10-CM | POA: Diagnosis not present

## 2017-06-14 ENCOUNTER — Encounter (HOSPITAL_COMMUNITY)
Admission: RE | Admit: 2017-06-14 | Discharge: 2017-06-14 | Disposition: A | Discharge status: Home / Self Care | Payer: Medicare Other | Source: Skilled Nursing Facility | Attending: Internal Medicine | Admitting: Internal Medicine

## 2017-06-14 DIAGNOSIS — I5042 Chronic combined systolic (congestive) and diastolic (congestive) heart failure: Secondary | ICD-10-CM | POA: Diagnosis not present

## 2017-06-14 DIAGNOSIS — R293 Abnormal posture: Secondary | ICD-10-CM | POA: Diagnosis not present

## 2017-06-14 DIAGNOSIS — R1310 Dysphagia, unspecified: Secondary | ICD-10-CM | POA: Diagnosis not present

## 2017-06-14 DIAGNOSIS — M19011 Primary osteoarthritis, right shoulder: Secondary | ICD-10-CM | POA: Diagnosis not present

## 2017-06-14 DIAGNOSIS — M6281 Muscle weakness (generalized): Secondary | ICD-10-CM | POA: Diagnosis not present

## 2017-06-14 DIAGNOSIS — Z5181 Encounter for therapeutic drug level monitoring: Secondary | ICD-10-CM | POA: Insufficient documentation

## 2017-06-14 DIAGNOSIS — R7989 Other specified abnormal findings of blood chemistry: Secondary | ICD-10-CM | POA: Insufficient documentation

## 2017-06-14 DIAGNOSIS — R269 Unspecified abnormalities of gait and mobility: Secondary | ICD-10-CM | POA: Diagnosis not present

## 2017-06-15 DIAGNOSIS — R269 Unspecified abnormalities of gait and mobility: Secondary | ICD-10-CM | POA: Diagnosis not present

## 2017-06-15 DIAGNOSIS — R293 Abnormal posture: Secondary | ICD-10-CM | POA: Diagnosis not present

## 2017-06-15 DIAGNOSIS — M19011 Primary osteoarthritis, right shoulder: Secondary | ICD-10-CM | POA: Diagnosis not present

## 2017-06-15 DIAGNOSIS — M6281 Muscle weakness (generalized): Secondary | ICD-10-CM | POA: Diagnosis not present

## 2017-06-15 DIAGNOSIS — I5042 Chronic combined systolic (congestive) and diastolic (congestive) heart failure: Secondary | ICD-10-CM | POA: Diagnosis not present

## 2017-06-15 DIAGNOSIS — R1310 Dysphagia, unspecified: Secondary | ICD-10-CM | POA: Diagnosis not present

## 2017-06-16 DIAGNOSIS — M6281 Muscle weakness (generalized): Secondary | ICD-10-CM | POA: Diagnosis not present

## 2017-06-16 DIAGNOSIS — R269 Unspecified abnormalities of gait and mobility: Secondary | ICD-10-CM | POA: Diagnosis not present

## 2017-06-16 DIAGNOSIS — I5042 Chronic combined systolic (congestive) and diastolic (congestive) heart failure: Secondary | ICD-10-CM | POA: Diagnosis not present

## 2017-06-16 DIAGNOSIS — R1310 Dysphagia, unspecified: Secondary | ICD-10-CM | POA: Diagnosis not present

## 2017-06-16 DIAGNOSIS — M19011 Primary osteoarthritis, right shoulder: Secondary | ICD-10-CM | POA: Diagnosis not present

## 2017-06-16 DIAGNOSIS — R293 Abnormal posture: Secondary | ICD-10-CM | POA: Diagnosis not present

## 2017-06-17 DIAGNOSIS — I5042 Chronic combined systolic (congestive) and diastolic (congestive) heart failure: Secondary | ICD-10-CM | POA: Diagnosis not present

## 2017-06-17 DIAGNOSIS — R1310 Dysphagia, unspecified: Secondary | ICD-10-CM | POA: Diagnosis not present

## 2017-06-17 DIAGNOSIS — M6281 Muscle weakness (generalized): Secondary | ICD-10-CM | POA: Diagnosis not present

## 2017-06-17 DIAGNOSIS — R269 Unspecified abnormalities of gait and mobility: Secondary | ICD-10-CM | POA: Diagnosis not present

## 2017-06-17 DIAGNOSIS — M19011 Primary osteoarthritis, right shoulder: Secondary | ICD-10-CM | POA: Diagnosis not present

## 2017-06-17 DIAGNOSIS — R293 Abnormal posture: Secondary | ICD-10-CM | POA: Diagnosis not present

## 2017-06-20 DIAGNOSIS — R269 Unspecified abnormalities of gait and mobility: Secondary | ICD-10-CM | POA: Diagnosis not present

## 2017-06-20 DIAGNOSIS — R293 Abnormal posture: Secondary | ICD-10-CM | POA: Diagnosis not present

## 2017-06-20 DIAGNOSIS — R1310 Dysphagia, unspecified: Secondary | ICD-10-CM | POA: Diagnosis not present

## 2017-06-20 DIAGNOSIS — I5042 Chronic combined systolic (congestive) and diastolic (congestive) heart failure: Secondary | ICD-10-CM | POA: Diagnosis not present

## 2017-06-20 DIAGNOSIS — M6281 Muscle weakness (generalized): Secondary | ICD-10-CM | POA: Diagnosis not present

## 2017-06-20 DIAGNOSIS — M19011 Primary osteoarthritis, right shoulder: Secondary | ICD-10-CM | POA: Diagnosis not present

## 2017-06-21 DIAGNOSIS — I5042 Chronic combined systolic (congestive) and diastolic (congestive) heart failure: Secondary | ICD-10-CM | POA: Diagnosis not present

## 2017-06-21 DIAGNOSIS — R1310 Dysphagia, unspecified: Secondary | ICD-10-CM | POA: Diagnosis not present

## 2017-06-21 DIAGNOSIS — M19011 Primary osteoarthritis, right shoulder: Secondary | ICD-10-CM | POA: Diagnosis not present

## 2017-06-21 DIAGNOSIS — M6281 Muscle weakness (generalized): Secondary | ICD-10-CM | POA: Diagnosis not present

## 2017-06-21 DIAGNOSIS — R293 Abnormal posture: Secondary | ICD-10-CM | POA: Diagnosis not present

## 2017-06-21 DIAGNOSIS — R269 Unspecified abnormalities of gait and mobility: Secondary | ICD-10-CM | POA: Diagnosis not present

## 2017-06-22 DIAGNOSIS — R293 Abnormal posture: Secondary | ICD-10-CM | POA: Diagnosis not present

## 2017-06-22 DIAGNOSIS — M19011 Primary osteoarthritis, right shoulder: Secondary | ICD-10-CM | POA: Diagnosis not present

## 2017-06-22 DIAGNOSIS — I5042 Chronic combined systolic (congestive) and diastolic (congestive) heart failure: Secondary | ICD-10-CM | POA: Diagnosis not present

## 2017-06-22 DIAGNOSIS — R269 Unspecified abnormalities of gait and mobility: Secondary | ICD-10-CM | POA: Diagnosis not present

## 2017-06-22 DIAGNOSIS — R1310 Dysphagia, unspecified: Secondary | ICD-10-CM | POA: Diagnosis not present

## 2017-06-22 DIAGNOSIS — M6281 Muscle weakness (generalized): Secondary | ICD-10-CM | POA: Diagnosis not present

## 2017-06-23 DIAGNOSIS — R1310 Dysphagia, unspecified: Secondary | ICD-10-CM | POA: Diagnosis not present

## 2017-06-23 DIAGNOSIS — M19011 Primary osteoarthritis, right shoulder: Secondary | ICD-10-CM | POA: Diagnosis not present

## 2017-06-23 DIAGNOSIS — M6281 Muscle weakness (generalized): Secondary | ICD-10-CM | POA: Diagnosis not present

## 2017-06-23 DIAGNOSIS — I5042 Chronic combined systolic (congestive) and diastolic (congestive) heart failure: Secondary | ICD-10-CM | POA: Diagnosis not present

## 2017-06-23 DIAGNOSIS — R293 Abnormal posture: Secondary | ICD-10-CM | POA: Diagnosis not present

## 2017-06-23 DIAGNOSIS — R269 Unspecified abnormalities of gait and mobility: Secondary | ICD-10-CM | POA: Diagnosis not present

## 2017-06-24 DIAGNOSIS — R269 Unspecified abnormalities of gait and mobility: Secondary | ICD-10-CM | POA: Diagnosis not present

## 2017-06-24 DIAGNOSIS — R1310 Dysphagia, unspecified: Secondary | ICD-10-CM | POA: Diagnosis not present

## 2017-06-24 DIAGNOSIS — M19011 Primary osteoarthritis, right shoulder: Secondary | ICD-10-CM | POA: Diagnosis not present

## 2017-06-24 DIAGNOSIS — I5042 Chronic combined systolic (congestive) and diastolic (congestive) heart failure: Secondary | ICD-10-CM | POA: Diagnosis not present

## 2017-06-24 DIAGNOSIS — R293 Abnormal posture: Secondary | ICD-10-CM | POA: Diagnosis not present

## 2017-06-24 DIAGNOSIS — M6281 Muscle weakness (generalized): Secondary | ICD-10-CM | POA: Diagnosis not present

## 2017-06-27 DIAGNOSIS — R1310 Dysphagia, unspecified: Secondary | ICD-10-CM | POA: Diagnosis not present

## 2017-06-27 DIAGNOSIS — I5042 Chronic combined systolic (congestive) and diastolic (congestive) heart failure: Secondary | ICD-10-CM | POA: Diagnosis not present

## 2017-06-27 DIAGNOSIS — M19011 Primary osteoarthritis, right shoulder: Secondary | ICD-10-CM | POA: Diagnosis not present

## 2017-06-27 DIAGNOSIS — M6281 Muscle weakness (generalized): Secondary | ICD-10-CM | POA: Diagnosis not present

## 2017-06-27 DIAGNOSIS — R269 Unspecified abnormalities of gait and mobility: Secondary | ICD-10-CM | POA: Diagnosis not present

## 2017-06-27 DIAGNOSIS — R293 Abnormal posture: Secondary | ICD-10-CM | POA: Diagnosis not present

## 2017-06-28 DIAGNOSIS — M6281 Muscle weakness (generalized): Secondary | ICD-10-CM | POA: Diagnosis not present

## 2017-06-28 DIAGNOSIS — R293 Abnormal posture: Secondary | ICD-10-CM | POA: Diagnosis not present

## 2017-06-28 DIAGNOSIS — I5042 Chronic combined systolic (congestive) and diastolic (congestive) heart failure: Secondary | ICD-10-CM | POA: Diagnosis not present

## 2017-06-28 DIAGNOSIS — M19011 Primary osteoarthritis, right shoulder: Secondary | ICD-10-CM | POA: Diagnosis not present

## 2017-06-28 DIAGNOSIS — R1310 Dysphagia, unspecified: Secondary | ICD-10-CM | POA: Diagnosis not present

## 2017-06-28 DIAGNOSIS — R269 Unspecified abnormalities of gait and mobility: Secondary | ICD-10-CM | POA: Diagnosis not present

## 2017-06-29 DIAGNOSIS — R269 Unspecified abnormalities of gait and mobility: Secondary | ICD-10-CM | POA: Diagnosis not present

## 2017-06-29 DIAGNOSIS — R1310 Dysphagia, unspecified: Secondary | ICD-10-CM | POA: Diagnosis not present

## 2017-06-29 DIAGNOSIS — M6281 Muscle weakness (generalized): Secondary | ICD-10-CM | POA: Diagnosis not present

## 2017-06-29 DIAGNOSIS — I5042 Chronic combined systolic (congestive) and diastolic (congestive) heart failure: Secondary | ICD-10-CM | POA: Diagnosis not present

## 2017-06-29 DIAGNOSIS — M19011 Primary osteoarthritis, right shoulder: Secondary | ICD-10-CM | POA: Diagnosis not present

## 2017-06-29 DIAGNOSIS — R293 Abnormal posture: Secondary | ICD-10-CM | POA: Diagnosis not present

## 2017-06-30 DIAGNOSIS — M19011 Primary osteoarthritis, right shoulder: Secondary | ICD-10-CM | POA: Diagnosis not present

## 2017-06-30 DIAGNOSIS — R269 Unspecified abnormalities of gait and mobility: Secondary | ICD-10-CM | POA: Diagnosis not present

## 2017-06-30 DIAGNOSIS — R1310 Dysphagia, unspecified: Secondary | ICD-10-CM | POA: Diagnosis not present

## 2017-06-30 DIAGNOSIS — M6281 Muscle weakness (generalized): Secondary | ICD-10-CM | POA: Diagnosis not present

## 2017-06-30 DIAGNOSIS — I5042 Chronic combined systolic (congestive) and diastolic (congestive) heart failure: Secondary | ICD-10-CM | POA: Diagnosis not present

## 2017-06-30 DIAGNOSIS — R293 Abnormal posture: Secondary | ICD-10-CM | POA: Diagnosis not present

## 2017-07-01 DIAGNOSIS — I5042 Chronic combined systolic (congestive) and diastolic (congestive) heart failure: Secondary | ICD-10-CM | POA: Diagnosis not present

## 2017-07-01 DIAGNOSIS — M6281 Muscle weakness (generalized): Secondary | ICD-10-CM | POA: Diagnosis not present

## 2017-07-01 DIAGNOSIS — R1310 Dysphagia, unspecified: Secondary | ICD-10-CM | POA: Diagnosis not present

## 2017-07-01 DIAGNOSIS — R293 Abnormal posture: Secondary | ICD-10-CM | POA: Diagnosis not present

## 2017-07-01 DIAGNOSIS — R269 Unspecified abnormalities of gait and mobility: Secondary | ICD-10-CM | POA: Diagnosis not present

## 2017-07-01 DIAGNOSIS — M19011 Primary osteoarthritis, right shoulder: Secondary | ICD-10-CM | POA: Diagnosis not present

## 2017-07-04 DIAGNOSIS — R269 Unspecified abnormalities of gait and mobility: Secondary | ICD-10-CM | POA: Diagnosis not present

## 2017-07-04 DIAGNOSIS — M6281 Muscle weakness (generalized): Secondary | ICD-10-CM | POA: Diagnosis not present

## 2017-07-04 DIAGNOSIS — F062 Psychotic disorder with delusions due to known physiological condition: Secondary | ICD-10-CM | POA: Diagnosis not present

## 2017-07-04 DIAGNOSIS — M19011 Primary osteoarthritis, right shoulder: Secondary | ICD-10-CM | POA: Diagnosis not present

## 2017-07-04 DIAGNOSIS — I5042 Chronic combined systolic (congestive) and diastolic (congestive) heart failure: Secondary | ICD-10-CM | POA: Diagnosis not present

## 2017-07-04 DIAGNOSIS — R1310 Dysphagia, unspecified: Secondary | ICD-10-CM | POA: Diagnosis not present

## 2017-07-04 DIAGNOSIS — F339 Major depressive disorder, recurrent, unspecified: Secondary | ICD-10-CM | POA: Diagnosis not present

## 2017-07-04 DIAGNOSIS — R293 Abnormal posture: Secondary | ICD-10-CM | POA: Diagnosis not present

## 2017-07-05 DIAGNOSIS — R1310 Dysphagia, unspecified: Secondary | ICD-10-CM | POA: Diagnosis not present

## 2017-07-05 DIAGNOSIS — R293 Abnormal posture: Secondary | ICD-10-CM | POA: Diagnosis not present

## 2017-07-05 DIAGNOSIS — M19011 Primary osteoarthritis, right shoulder: Secondary | ICD-10-CM | POA: Diagnosis not present

## 2017-07-05 DIAGNOSIS — M6281 Muscle weakness (generalized): Secondary | ICD-10-CM | POA: Diagnosis not present

## 2017-07-05 DIAGNOSIS — I5042 Chronic combined systolic (congestive) and diastolic (congestive) heart failure: Secondary | ICD-10-CM | POA: Diagnosis not present

## 2017-07-05 DIAGNOSIS — R269 Unspecified abnormalities of gait and mobility: Secondary | ICD-10-CM | POA: Diagnosis not present

## 2017-07-06 DIAGNOSIS — M6281 Muscle weakness (generalized): Secondary | ICD-10-CM | POA: Diagnosis not present

## 2017-07-06 DIAGNOSIS — R293 Abnormal posture: Secondary | ICD-10-CM | POA: Diagnosis not present

## 2017-07-06 DIAGNOSIS — R269 Unspecified abnormalities of gait and mobility: Secondary | ICD-10-CM | POA: Diagnosis not present

## 2017-07-06 DIAGNOSIS — M19011 Primary osteoarthritis, right shoulder: Secondary | ICD-10-CM | POA: Diagnosis not present

## 2017-07-06 DIAGNOSIS — R1310 Dysphagia, unspecified: Secondary | ICD-10-CM | POA: Diagnosis not present

## 2017-07-06 DIAGNOSIS — I5042 Chronic combined systolic (congestive) and diastolic (congestive) heart failure: Secondary | ICD-10-CM | POA: Diagnosis not present

## 2017-07-07 ENCOUNTER — Encounter: Payer: Self-pay | Admitting: Internal Medicine

## 2017-07-07 ENCOUNTER — Ambulatory Visit (HOSPITAL_COMMUNITY): Payer: Medicare Other | Attending: Internal Medicine

## 2017-07-07 ENCOUNTER — Encounter (HOSPITAL_COMMUNITY)
Admission: RE | Admit: 2017-07-07 | Discharge: 2017-07-07 | Disposition: A | Discharge status: Home / Self Care | Payer: Medicare Other | Source: Skilled Nursing Facility | Attending: *Deleted | Admitting: *Deleted

## 2017-07-07 DIAGNOSIS — S329XXA Fracture of unspecified parts of lumbosacral spine and pelvis, initial encounter for closed fracture: Secondary | ICD-10-CM | POA: Diagnosis not present

## 2017-07-07 DIAGNOSIS — M19011 Primary osteoarthritis, right shoulder: Secondary | ICD-10-CM | POA: Diagnosis not present

## 2017-07-07 DIAGNOSIS — Z8781 Personal history of (healed) traumatic fracture: Secondary | ICD-10-CM | POA: Diagnosis not present

## 2017-07-07 DIAGNOSIS — Z5181 Encounter for therapeutic drug level monitoring: Secondary | ICD-10-CM | POA: Diagnosis not present

## 2017-07-07 DIAGNOSIS — R293 Abnormal posture: Secondary | ICD-10-CM | POA: Diagnosis not present

## 2017-07-07 DIAGNOSIS — R7989 Other specified abnormal findings of blood chemistry: Secondary | ICD-10-CM | POA: Diagnosis not present

## 2017-07-07 DIAGNOSIS — M1612 Unilateral primary osteoarthritis, left hip: Secondary | ICD-10-CM | POA: Insufficient documentation

## 2017-07-07 DIAGNOSIS — S32512D Fracture of superior rim of left pubis, subsequent encounter for fracture with routine healing: Secondary | ICD-10-CM | POA: Diagnosis not present

## 2017-07-07 DIAGNOSIS — I5042 Chronic combined systolic (congestive) and diastolic (congestive) heart failure: Secondary | ICD-10-CM | POA: Diagnosis not present

## 2017-07-07 DIAGNOSIS — M6281 Muscle weakness (generalized): Secondary | ICD-10-CM | POA: Diagnosis not present

## 2017-07-07 DIAGNOSIS — R1310 Dysphagia, unspecified: Secondary | ICD-10-CM | POA: Diagnosis not present

## 2017-07-07 DIAGNOSIS — R269 Unspecified abnormalities of gait and mobility: Secondary | ICD-10-CM | POA: Diagnosis not present

## 2017-07-07 LAB — COMPREHENSIVE METABOLIC PANEL
ALK PHOS: 136 U/L — AB (ref 38–126)
ALT: 15 U/L (ref 14–54)
AST: 22 U/L (ref 15–41)
Albumin: 3.3 g/dL — ABNORMAL LOW (ref 3.5–5.0)
Anion gap: 9 (ref 5–15)
BILIRUBIN TOTAL: 0.6 mg/dL (ref 0.3–1.2)
BUN: 31 mg/dL — ABNORMAL HIGH (ref 6–20)
CALCIUM: 8.6 mg/dL — AB (ref 8.9–10.3)
CO2: 29 mmol/L (ref 22–32)
CREATININE: 0.72 mg/dL (ref 0.44–1.00)
Chloride: 102 mmol/L (ref 101–111)
GFR calc non Af Amer: >60 mL/min (ref >60)
Glucose, Bld: 93 mg/dL (ref 65–99)
Potassium: 3.7 mmol/L (ref 3.5–5.1)
SODIUM: 140 mmol/L (ref 135–145)
TOTAL PROTEIN: 6 g/dL — AB (ref 6.5–8.1)

## 2017-07-07 LAB — PHENYTOIN LEVEL, TOTAL: Phenytoin Lvl: 15.5 ug/mL (ref 10.0–20.0)
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
## 2017-07-08 DIAGNOSIS — R293 Abnormal posture: Secondary | ICD-10-CM | POA: Diagnosis not present

## 2017-07-08 DIAGNOSIS — I5042 Chronic combined systolic (congestive) and diastolic (congestive) heart failure: Secondary | ICD-10-CM | POA: Diagnosis not present

## 2017-07-08 DIAGNOSIS — R1310 Dysphagia, unspecified: Secondary | ICD-10-CM | POA: Diagnosis not present

## 2017-07-08 DIAGNOSIS — M6281 Muscle weakness (generalized): Secondary | ICD-10-CM | POA: Diagnosis not present

## 2017-07-08 DIAGNOSIS — R269 Unspecified abnormalities of gait and mobility: Secondary | ICD-10-CM | POA: Diagnosis not present

## 2017-07-08 DIAGNOSIS — M19011 Primary osteoarthritis, right shoulder: Secondary | ICD-10-CM | POA: Diagnosis not present

## 2017-07-11 ENCOUNTER — Non-Acute Institutional Stay (SKILLED_NURSING_FACILITY): Payer: Medicare Other

## 2017-07-11 DIAGNOSIS — Z0000 Encounter for general adult medical examination without abnormal findings: Secondary | ICD-10-CM
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
## 2017-07-11 NOTE — Patient Instructions (Signed)
Erica [name] , Thank you for taking time to come for your Medicare Wellness Visit. I appreciate your ongoing commitment to your health goals. Please review the following plan we discussed and let me know if I can assist you in the future.   Screening recommendations/referrals: Colonoscopy excluded, pt over age 81 Mammogram excluded, pt over age 74 Bone Density due, not ordered due to pt age Recommended yearly ophthalmology/optometry visit for glaucoma screening and checkup Recommended yearly dental visit for hygiene and checkup  Vaccinations: Influenza vaccine due Pneumococcal vaccine up to date Tdap vaccine due Shingles vaccine not in records    Advanced directives: DNR in chart, copies of health care power of attorney and living will are needed   Conditions/risks identified: none  Next appointment: Dr. Lyndel Safe makes rounds   Preventive Care 65 Years and Older, Female Preventive care refers to lifestyle choices and visits with your health care provider that can promote health and wellness. What does preventive care include?  A yearly physical exam. This is also called an annual well check.  Dental exams once or twice a year.  Routine eye exams. Ask your health care provider how often you should have your eyes checked.  Personal lifestyle choices, including:  Daily care of your teeth and gums.  Regular physical activity.  Eating a healthy diet.  Avoiding tobacco and drug use.  Limiting alcohol use.  Practicing safe sex.  Taking low-dose aspirin every day.  Taking vitamin and mineral supplements as recommended by your health care provider. What happens during an annual well check? The services and screenings done by your health care provider during your annual well check will depend on your age, overall health, lifestyle risk factors, and family history of disease. Counseling  Your health care provider may ask you questions about your:  Alcohol use.  Tobacco  use.  Drug use.  Emotional well-being.  Home and relationship well-being.  Sexual activity.  Eating habits.  History of falls.  Memory and ability to understand (cognition).  Work and work Statistician.  Reproductive health. Screening  You may have the following tests or measurements:  Height, weight, and BMI.  Blood pressure.  Lipid and cholesterol levels. These may be checked every 5 years, or more frequently if you are over 51 years old.  Skin check.  Lung cancer screening. You may have this screening every year starting at age 63 if you have a 30-pack-year history of smoking and currently smoke or have quit within the past 15 years.  Fecal occult blood test (FOBT) of the stool. You may have this test every year starting at age 68.  Flexible sigmoidoscopy or colonoscopy. You may have a sigmoidoscopy every 5 years or a colonoscopy every 10 years starting at age 59.  Hepatitis C blood test.  Hepatitis B blood test.  Sexually transmitted disease (STD) testing.  Diabetes screening. This is done by checking your blood sugar (glucose) after you have not eaten for a while (fasting). You may have this done every 1-3 years.  Bone density scan. This is done to screen for osteoporosis. You may have this done starting at age 6.  Mammogram. This may be done every 1-2 years. Talk to your health care provider about how often you should have regular mammograms. Talk with your health care provider about your test results, treatment options, and if necessary, the need for more tests. Vaccines  Your health care provider may recommend certain vaccines, such as:  Influenza vaccine. This is recommended every  year.  Tetanus, diphtheria, and acellular pertussis (Tdap, Td) vaccine. You may need a Td booster every 10 years.  Zoster vaccine. You may need this after age 14.  Pneumococcal 13-valent conjugate (PCV13) vaccine. One dose is recommended after age 26.  Pneumococcal  polysaccharide (PPSV23) vaccine. One dose is recommended after age 82. Talk to your health care provider about which screenings and vaccines you need and how often you need them. This information is not intended to replace advice given to you by your health care provider. Make sure you discuss any questions you have with your health care provider. Document Released: 10/24/2015 Document Revised: 06/16/2016 Document Reviewed: 07/29/2015 Elsevier Interactive Patient Education  2017 [place] Prevention in the Home Falls can cause injuries. They can happen to people of all ages. There are many things you can do to make your home safe and to help prevent falls. What can I do on the outside of my home?  Regularly fix the edges of walkways and driveways and fix any cracks.  Remove anything that might make you trip as you walk through a door, such as a raised step or threshold.  Trim any bushes or trees on the path to your home.  Use bright outdoor lighting.  Clear any walking paths of anything that might make someone trip, such as rocks or tools.  Regularly check to see if handrails are loose or broken. Make sure that both sides of any steps have handrails.  Any raised decks and porches should have guardrails on the edges.  Have any leaves, snow, or ice cleared regularly.  Use sand or salt on walking paths during winter.  Clean up any spills in your garage right away. This includes oil or grease spills. What can I do in the bathroom?  Use night lights.  Install grab bars by the toilet and in the tub and shower. Do not use towel bars as grab bars.  Use non-skid mats or decals in the tub or shower.  If you need to sit down in the shower, use a plastic, non-slip stool.  Keep the floor dry. Clean up any water that spills on the floor as soon as it happens.  Remove soap buildup in the tub or shower regularly.  Attach bath mats securely with double-sided non-slip rug  tape.  Do not have throw rugs and other things on the floor that can make you trip. What can I do in the bedroom?  Use night lights.  Make sure that you have a light by your bed that is easy to reach.  Do not use any sheets or blankets that are too big for your bed. They should not hang down onto the floor.  Have a firm chair that has side arms. You can use this for support while you get dressed.  Do not have throw rugs and other things on the floor that can make you trip. What can I do in the kitchen?  Clean up any spills right away.  Avoid walking on wet floors.  Keep items that you use a lot in easy-to-reach places.  If you need to reach something above you, use a strong step stool that has a grab bar.  Keep electrical cords out of the way.  Do not use floor polish or wax that makes floors slippery. If you must use wax, use non-skid floor wax.  Do not have throw rugs and other things on the floor that can make you trip. What can  I do with my stairs?  Do not leave any items on the stairs.  Make sure that there are handrails on both sides of the stairs and use them. Fix handrails that are broken or loose. Make sure that handrails are as long as the stairways.  Check any carpeting to make sure that it is firmly attached to the stairs. Fix any carpet that is loose or worn.  Avoid having throw rugs at the top or bottom of the stairs. If you do have throw rugs, attach them to the floor with carpet tape.  Make sure that you have a light switch at the top of the stairs and the bottom of the stairs. If you do not have them, ask someone to add them for you. What else can I do to help prevent falls?  Wear shoes that:  Do not have high heels.  Have rubber bottoms.  Are comfortable and fit you well.  Are closed at the toe. Do not wear sandals.  If you use a stepladder:  Make sure that it is fully opened. Do not climb a closed stepladder.  Make sure that both sides of the  stepladder are locked into place.  Ask someone to hold it for you, if possible.  Clearly mark and make sure that you can see:  Any grab bars or handrails.  First and last steps.  Where the edge of each step is.  Use tools that help you move around (mobility aids) if they are needed. These include:  Canes.  Walkers.  Scooters.  Crutches.  Turn on the lights when you go into a dark area. Replace any light bulbs as soon as they burn out.  Set up your furniture so you have a clear path. Avoid moving your furniture around.  If any of your floors are uneven, fix them.  If there are any pets around you, be aware of where they are.  Review your medicines with your doctor. Some medicines can make you feel dizzy. This can increase your chance of falling. Ask your doctor what other things that you can do to help prevent falls. This information is not intended to replace advice given to you by your health care provider. Make sure you discuss any questions you have with your health care provider. Document Released: 07/24/2009 Document Revised: 03/04/2016 Document Reviewed: 11/01/2014 Elsevier Interactive Patient Education  2017 [employer].

## 2017-07-13 ENCOUNTER — Encounter (HOSPITAL_COMMUNITY)
Admission: RE | Admit: 2017-07-13 | Discharge: 2017-07-13 | Disposition: A | Discharge status: Home / Self Care | Payer: Medicare Other | Source: Skilled Nursing Facility | Attending: Internal Medicine | Admitting: Internal Medicine

## 2017-07-13 DIAGNOSIS — D509 Iron deficiency anemia, unspecified: Secondary | ICD-10-CM | POA: Insufficient documentation

## 2017-07-13 DIAGNOSIS — R488 Other symbolic dysfunctions: Secondary | ICD-10-CM | POA: Insufficient documentation

## 2017-07-13 DIAGNOSIS — E039 Hypothyroidism, unspecified: Secondary | ICD-10-CM | POA: Insufficient documentation

## 2017-07-13 DIAGNOSIS — I5042 Chronic combined systolic (congestive) and diastolic (congestive) heart failure: Secondary | ICD-10-CM | POA: Insufficient documentation

## 2017-07-13 DIAGNOSIS — M6281 Muscle weakness (generalized): Secondary | ICD-10-CM | POA: Insufficient documentation

## 2017-07-13 DIAGNOSIS — R1310 Dysphagia, unspecified: Secondary | ICD-10-CM | POA: Insufficient documentation

## 2017-07-13 DIAGNOSIS — R1312 Dysphagia, oropharyngeal phase: Secondary | ICD-10-CM | POA: Diagnosis not present

## 2017-07-20 DIAGNOSIS — R488 Other symbolic dysfunctions: Secondary | ICD-10-CM | POA: Diagnosis not present

## 2017-07-20 DIAGNOSIS — R1312 Dysphagia, oropharyngeal phase: Secondary | ICD-10-CM | POA: Diagnosis not present

## 2017-07-20 DIAGNOSIS — I5042 Chronic combined systolic (congestive) and diastolic (congestive) heart failure: Secondary | ICD-10-CM | POA: Diagnosis not present

## 2017-08-09 ENCOUNTER — Encounter: Payer: Self-pay | Admitting: Internal Medicine

## 2017-08-09 ENCOUNTER — Non-Acute Institutional Stay (SKILLED_NURSING_FACILITY): Payer: Medicare Other | Admitting: Internal Medicine

## 2017-08-09 DIAGNOSIS — F32A Depression, unspecified: Secondary | ICD-10-CM

## 2017-08-09 DIAGNOSIS — F419 Anxiety disorder, unspecified: Secondary | ICD-10-CM

## 2017-08-09 DIAGNOSIS — F329 Major depressive disorder, single episode, unspecified: Secondary | ICD-10-CM

## 2017-08-09 DIAGNOSIS — I4891 Unspecified atrial fibrillation: Secondary | ICD-10-CM

## 2017-08-09 DIAGNOSIS — S32592D Other specified fracture of left pubis, subsequent encounter for fracture with routine healing: Secondary | ICD-10-CM | POA: Diagnosis not present

## 2017-08-09 DIAGNOSIS — G40909 Epilepsy, unspecified, not intractable, without status epilepticus: Secondary | ICD-10-CM

## 2017-08-09 DIAGNOSIS — E038 Other specified hypothyroidism: Secondary | ICD-10-CM

## 2017-08-09 DIAGNOSIS — R634 Abnormal weight loss: Secondary | ICD-10-CM

## 2017-08-09 DIAGNOSIS — I5042 Chronic combined systolic (congestive) and diastolic (congestive) heart failure: Secondary | ICD-10-CM | POA: Diagnosis not present
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
## 2017-08-10 ENCOUNTER — Encounter (HOSPITAL_COMMUNITY)
Admission: RE | Admit: 2017-08-10 | Discharge: 2017-08-10 | Disposition: A | Discharge status: Home / Self Care | Payer: Medicare Other | Source: Skilled Nursing Facility | Attending: Internal Medicine | Admitting: Internal Medicine

## 2017-08-10 DIAGNOSIS — I5042 Chronic combined systolic (congestive) and diastolic (congestive) heart failure: Secondary | ICD-10-CM | POA: Diagnosis not present

## 2017-08-10 DIAGNOSIS — D509 Iron deficiency anemia, unspecified: Secondary | ICD-10-CM | POA: Diagnosis not present

## 2017-08-10 DIAGNOSIS — M6281 Muscle weakness (generalized): Secondary | ICD-10-CM | POA: Insufficient documentation

## 2017-08-10 DIAGNOSIS — F329 Major depressive disorder, single episode, unspecified: Secondary | ICD-10-CM | POA: Insufficient documentation

## 2017-08-10 LAB — CBC WITH DIFFERENTIAL/PLATELET
Basophils Absolute: 0 10*3/uL (ref 0.0–0.1)
Basophils Relative: 0 %
EOS PCT: 4 %
Eosinophils Absolute: 0.2 10*3/uL (ref 0.0–0.7)
HEMATOCRIT: 40.2 % (ref 36.0–46.0)
Hemoglobin: 13.3 g/dL (ref 12.0–15.0)
LYMPHS PCT: 20 %
Lymphs Abs: 1.2 10*3/uL (ref 0.7–4.0)
MCH: 32.4 pg (ref 26.0–34.0)
MCHC: 33.1 g/dL (ref 30.0–36.0)
MCV: 97.8 fL (ref 78.0–100.0)
MONO ABS: 0.6 10*3/uL (ref 0.1–1.0)
MONOS PCT: 10 %
NEUTROS ABS: 3.8 10*3/uL (ref 1.7–7.7)
Neutrophils Relative %: 66 %
Platelets: 153 10*3/uL (ref 150–400)
RBC: 4.11 MIL/uL (ref 3.87–5.11)
RDW: 13.8 % (ref 11.5–15.5)
WBC: 5.8 10*3/uL (ref 4.0–10.5)

## 2017-08-10 LAB — BASIC METABOLIC PANEL
Anion gap: 9 (ref 5–15)
BUN: 31 mg/dL — AB (ref 6–20)
CHLORIDE: 99 mmol/L — AB (ref 101–111)
CO2: 32 mmol/L (ref 22–32)
CREATININE: 0.81 mg/dL (ref 0.44–1.00)
Calcium: 8.9 mg/dL (ref 8.9–10.3)
GFR calc Af Amer: >60 mL/min (ref >60)
GFR calc non Af Amer: 59 mL/min — ABNORMAL LOW (ref >60)
GLUCOSE: 98 mg/dL (ref 65–99)
Potassium: 3.8 mmol/L (ref 3.5–5.1)
Sodium: 140 mmol/L (ref 135–145)

## 2017-08-10 LAB — TSH: TSH: 5.614 u[IU]/mL — ABNORMAL HIGH (ref 0.350–4.500)

## 2017-08-13 ENCOUNTER — Inpatient Hospital Stay
Admission: RE | Admit: 2017-08-13 | Discharge: 2017-08-22 | Disposition: A | Discharge status: Nursing Home (Includes ICF) | Payer: Medicare Other | Source: Ambulatory Visit | Attending: Internal Medicine | Admitting: Internal Medicine

## 2017-08-13 ENCOUNTER — Emergency Department (HOSPITAL_COMMUNITY)
Admission: EM | Admit: 2017-08-13 | Discharge: 2017-08-13 | Disposition: A | Discharge status: Home / Self Care | Payer: Medicare Other | Attending: Emergency Medicine | Admitting: Emergency Medicine

## 2017-08-13 ENCOUNTER — Emergency Department (HOSPITAL_COMMUNITY): Payer: Medicare Other

## 2017-08-13 ENCOUNTER — Encounter (HOSPITAL_COMMUNITY): Payer: Self-pay | Admitting: Emergency Medicine

## 2017-08-13 DIAGNOSIS — D329 Benign neoplasm of meninges, unspecified: Secondary | ICD-10-CM | POA: Diagnosis not present

## 2017-08-13 DIAGNOSIS — Z96653 Presence of artificial knee joint, bilateral: Secondary | ICD-10-CM | POA: Diagnosis not present

## 2017-08-13 DIAGNOSIS — I110 Hypertensive heart disease with heart failure: Secondary | ICD-10-CM | POA: Insufficient documentation

## 2017-08-13 DIAGNOSIS — S0101XA Laceration without foreign body of scalp, initial encounter: Secondary | ICD-10-CM | POA: Insufficient documentation

## 2017-08-13 DIAGNOSIS — Y92129 Unspecified place in nursing home as the place of occurrence of the external cause: Secondary | ICD-10-CM | POA: Insufficient documentation

## 2017-08-13 DIAGNOSIS — Y999 Unspecified external cause status: Secondary | ICD-10-CM | POA: Insufficient documentation

## 2017-08-13 DIAGNOSIS — Z7901 Long term (current) use of anticoagulants: Secondary | ICD-10-CM | POA: Insufficient documentation

## 2017-08-13 DIAGNOSIS — I5042 Chronic combined systolic (congestive) and diastolic (congestive) heart failure: Secondary | ICD-10-CM | POA: Insufficient documentation

## 2017-08-13 DIAGNOSIS — Y939 Activity, unspecified: Secondary | ICD-10-CM | POA: Diagnosis not present

## 2017-08-13 DIAGNOSIS — Z79899 Other long term (current) drug therapy: Secondary | ICD-10-CM | POA: Insufficient documentation

## 2017-08-13 DIAGNOSIS — Z96641 Presence of right artificial hip joint: Secondary | ICD-10-CM | POA: Diagnosis not present

## 2017-08-13 DIAGNOSIS — S0990XA Unspecified injury of head, initial encounter: Secondary | ICD-10-CM | POA: Diagnosis present

## 2017-08-13 DIAGNOSIS — W01198A Fall on same level from slipping, tripping and stumbling with subsequent striking against other object, initial encounter: Secondary | ICD-10-CM | POA: Diagnosis not present

## 2017-08-13 DIAGNOSIS — E039 Hypothyroidism, unspecified: Secondary | ICD-10-CM | POA: Diagnosis not present

## 2017-08-13 DIAGNOSIS — Z23 Encounter for immunization: Secondary | ICD-10-CM | POA: Insufficient documentation

## 2017-08-13 DIAGNOSIS — D320 Benign neoplasm of cerebral meninges: Secondary | ICD-10-CM | POA: Diagnosis not present

## 2017-08-13 DIAGNOSIS — S0003XA Contusion of scalp, initial encounter: Secondary | ICD-10-CM | POA: Diagnosis not present

## 2017-08-13 DIAGNOSIS — Z85828 Personal history of other malignant neoplasm of skin: Secondary | ICD-10-CM | POA: Diagnosis not present

## 2017-08-13 MED ORDER — LIDOCAINE-EPINEPHRINE (PF) 2 %-1:200000 IJ SOLN
10.0000 mL | Freq: Once | INTRAMUSCULAR | Status: DC
  Filled 2017-08-13: qty 20

## 2017-08-13 MED ORDER — TETANUS-DIPHTH-ACELL PERTUSSIS 5-2.5-18.5 LF-MCG/0.5 IM SUSP
0.5000 mL | Freq: Once | INTRAMUSCULAR | Status: AC
Start: 2017-08-13 — End: 2017-08-13
  Administered 2017-08-13: 0.5 mL via INTRAMUSCULAR
  Filled 2017-08-13: qty 0.5

## 2017-08-13 NOTE — ED Notes (Addendum)
Patient ambulated from stretcher to wheelchair with assistance. Patient able to stand with assistance.

## 2017-08-13 NOTE — Discharge Instructions (Signed)
Staple removal in 7 days,

## 2017-08-13 NOTE — ED Triage Notes (Signed)
Pt sent from the [facility] for eval from a fall transferring from toilet to chair.  Lac to top of head and right knee pain.  Denies loc.

## 2017-08-13 NOTE — ED Notes (Signed)
Ice applied to top of head. Pt tolerated well. nad noted.
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
## 2017-08-22 ENCOUNTER — Telehealth: Payer: Self-pay

## 2017-08-22 ENCOUNTER — Emergency Department (HOSPITAL_COMMUNITY)
Admission: EM | Admit: 2017-08-22 | Discharge: 2017-08-22 | Disposition: A | Discharge status: Skilled Nursing Facility | Payer: Medicare Other | Attending: Emergency Medicine | Admitting: Emergency Medicine

## 2017-08-22 ENCOUNTER — Inpatient Hospital Stay
Admission: RE | Admit: 2017-08-22 | Discharge: 2017-12-20 | Disposition: A | Discharge status: Other Acute Inpatient Hospital | Payer: Medicare Other | Source: Ambulatory Visit | Attending: Internal Medicine | Admitting: Internal Medicine

## 2017-08-22 ENCOUNTER — Emergency Department (HOSPITAL_COMMUNITY): Payer: Medicare Other

## 2017-08-22 ENCOUNTER — Encounter (HOSPITAL_COMMUNITY): Payer: Self-pay | Admitting: *Deleted

## 2017-08-22 ENCOUNTER — Encounter: Payer: Self-pay | Admitting: Internal Medicine

## 2017-08-22 ENCOUNTER — Other Ambulatory Visit: Payer: Self-pay

## 2017-08-22 ENCOUNTER — Non-Acute Institutional Stay (SKILLED_NURSING_FACILITY): Payer: Medicare Other | Admitting: Internal Medicine

## 2017-08-22 DIAGNOSIS — M791 Myalgia, unspecified site: Secondary | ICD-10-CM | POA: Insufficient documentation

## 2017-08-22 DIAGNOSIS — Y999 Unspecified external cause status: Secondary | ICD-10-CM | POA: Diagnosis not present

## 2017-08-22 DIAGNOSIS — Y92199 Unspecified place in other specified residential institution as the place of occurrence of the external cause: Secondary | ICD-10-CM | POA: Insufficient documentation

## 2017-08-22 DIAGNOSIS — I5042 Chronic combined systolic (congestive) and diastolic (congestive) heart failure: Secondary | ICD-10-CM | POA: Diagnosis not present

## 2017-08-22 DIAGNOSIS — S199XXA Unspecified injury of neck, initial encounter: Secondary | ICD-10-CM | POA: Diagnosis not present

## 2017-08-22 DIAGNOSIS — Z7901 Long term (current) use of anticoagulants: Secondary | ICD-10-CM | POA: Insufficient documentation

## 2017-08-22 DIAGNOSIS — Z79899 Other long term (current) drug therapy: Secondary | ICD-10-CM | POA: Insufficient documentation

## 2017-08-22 DIAGNOSIS — W01190A Fall on same level from slipping, tripping and stumbling with subsequent striking against furniture, initial encounter: Secondary | ICD-10-CM | POA: Insufficient documentation

## 2017-08-22 DIAGNOSIS — G40909 Epilepsy, unspecified, not intractable, without status epilepticus: Secondary | ICD-10-CM

## 2017-08-22 DIAGNOSIS — S6991XA Unspecified injury of right wrist, hand and finger(s), initial encounter: Secondary | ICD-10-CM | POA: Diagnosis not present

## 2017-08-22 DIAGNOSIS — R51 Headache: Secondary | ICD-10-CM | POA: Insufficient documentation

## 2017-08-22 DIAGNOSIS — S61216A Laceration without foreign body of right little finger without damage to nail, initial encounter: Secondary | ICD-10-CM | POA: Diagnosis not present

## 2017-08-22 DIAGNOSIS — W19XXXA Unspecified fall, initial encounter: Secondary | ICD-10-CM

## 2017-08-22 DIAGNOSIS — I10 Essential (primary) hypertension: Secondary | ICD-10-CM

## 2017-08-22 DIAGNOSIS — E038 Other specified hypothyroidism: Secondary | ICD-10-CM | POA: Diagnosis not present

## 2017-08-22 DIAGNOSIS — S0993XA Unspecified injury of face, initial encounter: Secondary | ICD-10-CM | POA: Diagnosis not present

## 2017-08-22 DIAGNOSIS — E039 Hypothyroidism, unspecified: Secondary | ICD-10-CM | POA: Diagnosis not present

## 2017-08-22 DIAGNOSIS — I4891 Unspecified atrial fibrillation: Secondary | ICD-10-CM

## 2017-08-22 DIAGNOSIS — R296 Repeated falls: Secondary | ICD-10-CM | POA: Insufficient documentation

## 2017-08-22 DIAGNOSIS — S01111A Laceration without foreign body of right eyelid and periocular area, initial encounter: Secondary | ICD-10-CM | POA: Diagnosis not present

## 2017-08-22 DIAGNOSIS — S3993XA Unspecified injury of pelvis, initial encounter: Secondary | ICD-10-CM | POA: Diagnosis not present

## 2017-08-22 DIAGNOSIS — Z85828 Personal history of other malignant neoplasm of skin: Secondary | ICD-10-CM | POA: Diagnosis not present

## 2017-08-22 DIAGNOSIS — S1181XA Laceration without foreign body of other specified part of neck, initial encounter: Secondary | ICD-10-CM | POA: Diagnosis not present

## 2017-08-22 DIAGNOSIS — M79644 Pain in right finger(s): Secondary | ICD-10-CM | POA: Insufficient documentation

## 2017-08-22 DIAGNOSIS — S0990XA Unspecified injury of head, initial encounter: Secondary | ICD-10-CM | POA: Diagnosis not present

## 2017-08-22 DIAGNOSIS — T148XXA Other injury of unspecified body region, initial encounter: Secondary | ICD-10-CM

## 2017-08-22 DIAGNOSIS — M542 Cervicalgia: Secondary | ICD-10-CM | POA: Diagnosis not present

## 2017-08-22 DIAGNOSIS — S299XXA Unspecified injury of thorax, initial encounter: Secondary | ICD-10-CM | POA: Diagnosis not present

## 2017-08-22 NOTE — ED Notes (Signed)
Attempted to ambulate pt, pt steady sitting on edge of the bed but when attempted to assist pt with ambulating pt unable to hold her own weight and stated that she wasn't comfortable walking around.

## 2017-08-22 NOTE — Progress Notes (Signed)
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  Bones osteoporotic. 4. Foci of calcification in the right subclavian artery and in both carotid arteries. Electronically Signed   By: Lowella Grip III M.D.   On: 08/22/2017 07:23   Dg Hand Complete Right  Result Date: 08/22/2017 CLINICAL DATA:  Status post fall out of bed, with skin tear at the right fifth metacarpal. EXAM: RIGHT HAND - COMPLETE 3+ VIEW COMPARISON:  Right hand radiographs performed 09/11/2015 FINDINGS: There is no evidence of fracture or dislocation. Hardware along the distal radius appears intact. Mild joint space narrowing is noted at the second and fifth distal interphalangeal joints, with associated osteophyte formation. The carpal rows are intact, and demonstrate normal alignment. The known skin tear is not well characterized on radiograph. No radiopaque foreign bodies are seen. IMPRESSION: No evidence of fracture or dislocation. Hardware along the distal radius appears intact. Electronically Signed   By: Francoise Schaumann.D.  On: 08/22/2017 07:00   Ct Maxillofacial Wo Contrast  Result Date:  08/22/2017 CLINICAL DATA:  Pain following fall EXAM: CT HEAD WITHOUT CONTRAST CT MAXILLOFACIAL WITHOUT CONTRAST CT CERVICAL SPINE WITHOUT CONTRAST TECHNIQUE: Multidetector CT imaging of the head, cervical spine, and maxillofacial structures were performed using the standard protocol without intravenous contrast. Multiplanar CT image reconstructions of the cervical spine and maxillofacial structures were also generated. COMPARISON:  Head CT August 13, 2017; cervical spine CT September 11, 2015 FINDINGS: CT HEAD FINDINGS Brain: The previously noted meningioma in the inferior posterior right frontal lobe is again noted with moderate surrounding vasogenic edema. There is previously documented mass measures 4.0 x 3.8 x 3.5 cm, essentially stable. There is calcification in the central portion of this meningioma. There is surrounding vasogenic edema which appears stable. There is modest impression on the frontal horn the right lateral ventricle from this edema. There remains 5 mm of midline shift toward the left given this edema. No new mass or edema evident. There is no acute hemorrhage. There is no subdural or epidural fluid collection. There is patchy small vessel disease in the centra semiovale bilaterally. Underlying mild diffuse atrophy is stable. Vascular: There is no appreciable hyperdense vessel. There is calcification in each carotid siphon. Skull: Bony calvarium appears intact.  Bones are osteoporotic. Other: Mastoid air cells are clear. CT MAXILLOFACIAL FINDINGS Osseous: There is no demonstrable fracture or dislocation. No blastic or lytic bone lesions are apparent. Orbits: Orbits appear symmetric bilaterally. No intraorbital lesion. Sinuses: There is mucosal thickening in several ethmoid air cells bilaterally. There is also mucosal thickening in both inferior maxillary antra. There is slight mucosal thickening in the posterolateral aspect the left sphenoid sinus. Other paranasal sinuses are clear. No air-fluid  level. No bony destruction or expansion. Ostiomeatal unit complexes are patent bilaterally. There is no nares obstruction. Soft tissues: There is no appreciable soft tissue hematoma or abscess. There is soft tissue swelling over the anterior upper face on the right. Salivary glands appear symmetric bilaterally. No adenopathy evident. Tongue and tongue base regions appear normal. Visualized pharynx appears normal. CT CERVICAL SPINE FINDINGS Alignment: There is chronic reversal of lordotic curvature. There is 1 mm of retrolisthesis of C5 on C6. No other appreciable spondylolisthesis. Skull base and vertebrae: Skull base and craniocervical junction regions appear normal. There is pannus posterior to the odontoid which does not cause significant impression on the craniocervical junction. There is evidence of a prior impaction injury in the inferior aspect of the odontoid, stable from prior study. There is bony remodeling in this area. There is no acute fracture evident. There are no blastic or lytic bone lesions. Bones appear osteoporotic. There is cystic change throughout the odontoid without impending fracture evident. These changes are similar to prior study from 2016. Soft tissues and spinal canal: Prevertebral soft tissues and predental space regions are normal. There are no paraspinous lesions. No cord canal hematoma is demonstrable on this study. There is nuchal ligament calcification in the posterior lower cervical spine region. Disc levels: There is moderately severe disc space narrowing at C3-4, C4-5, C5-6, and C6-7. There is multilevel facet hypertrophy with exit foraminal narrowing due to bony hypertrophy at multiple levels. Exit foraminal narrowing focally is most severe at C3-4 on the left where there is impression on the exiting nerve root due to bony hypertrophy. There is no disc extrusion or high-grade stenosis on this study. Upper chest: No visualized upper lung zones are clear. Other: There is  calcification in  the right subclavian artery. There is calcification in each carotid artery. IMPRESSION: CT head: Stable known inferior right frontal meningioma with moderate surrounding vasogenic edema. This edema causes mild mass effect on the frontal horn the right lateral ventricle. There is mild midline shift of 5 mm toward the left, similar to recent study. There is no new mass effect or increase in midline shift compared to recent prior CT examination. Elsewhere there is atrophy with periventricular small vessel disease. No acute infarct evident. No hemorrhage or new mass. No extra-axial fluid. There are foci of arterial vascular calcification. CT maxillofacial: No acute fracture or dislocation. There are areas of mucosal thickening in several paranasal sinuses. No air-fluid levels. Ostiomeatal unit complexes are patent bilaterally. Orbits appear symmetric and unremarkable bilaterally. CT cervical spine: 1. No acute fracture. Evidence of old trauma with impaction in the inferior aspect of the odontoid with remodeling, stable in appearance compared to prior study from 2016. 2. Minimal spondylolisthesis at C5-6 due to underlying spondylosis. No other spondylolisthesis. 3.  Multilevel arthropathy.  Bones osteoporotic. 4. Foci of calcification in the right subclavian artery and in both carotid arteries. Electronically Signed   By: Lowella Grip III M.D.   On: 08/22/2017 07:23    Assessment/Plan  Recurrent falls Patient falls at least once every few days. And mostly it is due to her trying to get out of her bed or Wheelchair without calling for help. D/W Staff and patient to wait for help. Continue Fall precautions. Will have to reconsider discontinuing her Eliquis due t recurrent falls. Also repeat Labs including CMP, CBC, And UA Her Meds were reviewed.   Atrial fibrillation with RVR  Rate controlled on Eliquis. Will decrease her Metoprolol to 25 mg. If she continues to have recurrent falls  will decrease her Eliquis and start her on aspirin.  Essential hypertension BP controlled   Chronic combined systolic and diastolic congestive heart failure Echo in 2015 showed LVH with EF of 40-45% On Torsemide Stable  Seizure disorder  Dilantin level was normal in 09/18. No Visible Seizures seen  Hypothyroidism SYnthyroid was adjusted for her TSH being high.  Hypokalemia Stable on Supplement and Spirinolactone Anxiety and depression Continue BuSpar. Se is stable.  Family/ staff Communication:   Labs/tests ordered:   Total time spent in this patient care encounter was 45_ minutes; greater than 50% of the visit spent counseling patient, reviewing records , Labs and coordinating care for problems addressed at this encounter.

## 2017-08-22 NOTE — Telephone Encounter (Signed)
Possible re-admission to facility. This is a patient you were seeing at [facility]: Erica [name] W[facility] F/U is needed if patient was re-admitted to facility upon discharge. Hospital discharge from [facility] on 08/22/2017

## 2017-08-22 NOTE — ED Notes (Signed)
Steri stripped skin tear to right side of neck and right hand.

## 2017-08-22 NOTE — ED Notes (Signed)
Skin tear to right hand and right neck noted.

## 2017-08-22 NOTE — ED Notes (Signed)
Attempted to call [facility] 3 times with no answer.

## 2017-08-22 NOTE — Discharge Instructions (Signed)
Your imaging is negative for acute traumatic injury. The meningioma in your brain looks to be stable. Follow up with your doctor. Return to the ED if you develop new or worsening symptoms.

## 2017-08-22 NOTE — ED Triage Notes (Signed)
Pt brought over by the [facility] staff for c/o fall; pt states she was getting up to go to bathroom and slipped and fell; pt states she slipped on her foot and fell striking her head and neck on the bed; pt has skin tear to right side of neck; right little finger and bruising to right eye

## 2017-08-22 NOTE — ED Provider Notes (Signed)
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 evidence of a prior impaction injury in the inferior aspect of the odontoid, stable from prior study. There is bony remodeling in this area. There is no acute fracture evident. There are no blastic or lytic bone lesions. Bones appear osteoporotic. There is cystic change throughout the odontoid without impending fracture evident. These changes are similar to prior study from 2016. Soft tissues and spinal canal: Prevertebral soft tissues and predental space regions are normal. There are no paraspinous lesions. No cord canal hematoma is demonstrable on this study. There is nuchal ligament calcification in the posterior lower cervical spine region. Disc levels: There is moderately severe disc space narrowing at C3-4, C4-5, C5-6, and C6-7. There is multilevel facet hypertrophy with exit foraminal narrowing due to bony hypertrophy at multiple levels. Exit foraminal narrowing focally is most severe at C3-4 on the left where there is impression on the exiting nerve root due to bony hypertrophy.  There is no disc extrusion or high-grade stenosis on this study. Upper chest: No visualized upper lung zones are clear. Other: There is calcification in the right subclavian artery. There is calcification in each carotid artery. IMPRESSION: CT head: Stable known inferior right frontal meningioma with moderate surrounding vasogenic edema. This edema causes mild mass effect on the frontal horn the right lateral  ventricle. There is mild midline shift of 5 mm toward the left, similar to recent study. There is no new mass effect or increase in midline shift compared to recent prior CT examination. Elsewhere there is atrophy with periventricular small vessel disease. No acute infarct evident. No hemorrhage or new mass. No extra-axial fluid. There are foci of arterial vascular calcification. CT maxillofacial: No acute fracture or dislocation. There are areas of mucosal thickening in several paranasal sinuses. No air-fluid levels. Ostiomeatal unit complexes are patent bilaterally. Orbits appear symmetric and unremarkable bilaterally. CT cervical spine: 1. No acute fracture. Evidence of old trauma with impaction in the inferior aspect of the odontoid with remodeling, stable in appearance compared to prior study from 2016. 2. Minimal spondylolisthesis at C5-6 due to underlying spondylosis. No other spondylolisthesis. 3.  Multilevel arthropathy.  Bones osteoporotic. 4. Foci of calcification in the right subclavian artery and in both carotid arteries. Electronically Signed   By: Lowella Grip III M.D.   On: 08/22/2017 07:23    Procedures Procedures (including critical care time)  Medications Ordered in ED Medications - No data to display   Initial Impression / Assessment and Plan / ED Course  I have reviewed the triage vital signs and the nursing notes.  Pertinent labs & imaging results that were available during my care of the patient were reviewed by me and considered in my medical decision making (see chart  for details).    Patient with skin tear to right hand and right neck after mechanical fall.  Denies losing consciousness.  No vomiting.  She does take Eliquis.  Imaging is obtained and negative for acute traumatic injury.  Patient appears to have stable meningioma compared to CT scan last week. No intervention previously recommended by neurosurgery.   Tetanus is up to date.  Steri strips placed to skin tears.  Patient ambulated with assistance.   She appears stable to return to nursing facility.  Final Clinical Impressions(s) / ED Diagnoses   Final diagnoses:  Fall, initial encounter  Multiple skin tears    ED Discharge Orders    None       Konor Noren, Annie Main, MD 08/22/17 [phone]

## 2017-08-23 ENCOUNTER — Encounter (HOSPITAL_COMMUNITY)
Admission: RE | Admit: 2017-08-23 | Discharge: 2017-08-23 | Disposition: A | Discharge status: Home / Self Care | Payer: Medicare Other | Source: Skilled Nursing Facility | Attending: Pediatrics | Admitting: Pediatrics

## 2017-08-23 DIAGNOSIS — F329 Major depressive disorder, single episode, unspecified: Secondary | ICD-10-CM | POA: Diagnosis not present

## 2017-08-23 DIAGNOSIS — I5042 Chronic combined systolic (congestive) and diastolic (congestive) heart failure: Secondary | ICD-10-CM | POA: Insufficient documentation

## 2017-08-23 DIAGNOSIS — M19011 Primary osteoarthritis, right shoulder: Secondary | ICD-10-CM | POA: Insufficient documentation

## 2017-08-23 DIAGNOSIS — M159 Polyosteoarthritis, unspecified: Secondary | ICD-10-CM | POA: Diagnosis not present

## 2017-08-23 LAB — COMPREHENSIVE METABOLIC PANEL
ALK PHOS: 118 U/L (ref 38–126)
ALT: 21 U/L (ref 14–54)
ANION GAP: 8 (ref 5–15)
AST: 28 U/L (ref 15–41)
Albumin: 3.3 g/dL — ABNORMAL LOW (ref 3.5–5.0)
BILIRUBIN TOTAL: 0.8 mg/dL (ref 0.3–1.2)
BUN: 26 mg/dL — ABNORMAL HIGH (ref 6–20)
CALCIUM: 8.7 mg/dL — AB (ref 8.9–10.3)
CO2: 31 mmol/L (ref 22–32)
CREATININE: 0.75 mg/dL (ref 0.44–1.00)
Chloride: 101 mmol/L (ref 101–111)
GFR calc non Af Amer: >60 mL/min (ref >60)
Glucose, Bld: 106 mg/dL — ABNORMAL HIGH (ref 65–99)
Potassium: 3.6 mmol/L (ref 3.5–5.1)
Sodium: 140 mmol/L (ref 135–145)
TOTAL PROTEIN: 6 g/dL — AB (ref 6.5–8.1)

## 2017-08-23 LAB — URINALYSIS, ROUTINE W REFLEX MICROSCOPIC
Bilirubin Urine: NEGATIVE
GLUCOSE, UA: NEGATIVE mg/dL
KETONES UR: NEGATIVE mg/dL
NITRITE: POSITIVE — AB
PH: 5 (ref 5.0–8.0)
PROTEIN: NEGATIVE mg/dL
SQUAMOUS EPITHELIAL / LPF: NONE SEEN
Specific Gravity, Urine: 1.011 (ref 1.005–1.030)

## 2017-08-23 LAB — CBC
HEMATOCRIT: 39.3 % (ref 36.0–46.0)
HEMOGLOBIN: 12.7 g/dL (ref 12.0–15.0)
MCH: 32.2 pg (ref 26.0–34.0)
MCHC: 32.3 g/dL (ref 30.0–36.0)
MCV: 99.7 fL (ref 78.0–100.0)
Platelets: 163 10*3/uL (ref 150–400)
RBC: 3.94 MIL/uL (ref 3.87–5.11)
RDW: 13.9 % (ref 11.5–15.5)
WBC: 5.3 10*3/uL (ref 4.0–10.5)

## 2017-08-23 NOTE — Progress Notes (Signed)
This encounter was created in error - please disregard.

## 2017-08-25 ENCOUNTER — Non-Acute Institutional Stay (SKILLED_NURSING_FACILITY): Payer: Medicare Other | Admitting: Internal Medicine

## 2017-08-25 ENCOUNTER — Encounter: Payer: Self-pay | Admitting: Internal Medicine

## 2017-08-25 DIAGNOSIS — N390 Urinary tract infection, site not specified: Secondary | ICD-10-CM

## 2017-08-25 DIAGNOSIS — I4891 Unspecified atrial fibrillation: Secondary | ICD-10-CM

## 2017-08-25 DIAGNOSIS — E038 Other specified hypothyroidism: Secondary | ICD-10-CM

## 2017-08-25 DIAGNOSIS — I5042 Chronic combined systolic (congestive) and diastolic (congestive) heart failure: Secondary | ICD-10-CM | POA: Diagnosis not present

## 2017-08-25 LAB — URINE CULTURE

## 2017-08-25 NOTE — Progress Notes (Signed)
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 09/11/2015 FINDINGS: There is no evidence of fracture or dislocation. Hardware along the distal radius appears intact. Mild joint space narrowing is noted at the second and fifth distal interphalangeal joints, with associated osteophyte formation. The carpal rows are intact, and demonstrate normal alignment. The known skin tear is not well characterized on radiograph. No radiopaque foreign bodies are seen. IMPRESSION: No evidence of fracture or dislocation. Hardware along the distal radius appears intact. Electronically Signed   By: Garald Balding M.D.   On: 08/22/2017 07:00   Ct Maxillofacial Wo Contrast  Result Date: 08/22/2017 CLINICAL DATA:  Pain following fall EXAM: CT HEAD WITHOUT CONTRAST CT MAXILLOFACIAL WITHOUT CONTRAST CT CERVICAL SPINE WITHOUT CONTRAST TECHNIQUE: Multidetector CT imaging of the head, cervical spine, and maxillofacial structures were performed using the standard protocol without intravenous contrast. Multiplanar CT image reconstructions of the cervical spine and maxillofacial structures were also generated. COMPARISON:  Head CT August 13, 2017; cervical spine CT September 11, 2015 FINDINGS: CT HEAD  FINDINGS Brain: The previously noted meningioma in the inferior posterior right frontal lobe is again noted with moderate surrounding vasogenic edema. There is previously documented mass measures 4.0 x 3.8 x 3.5 cm, essentially stable. There is calcification in the central portion of this meningioma.  There is surrounding vasogenic edema which appears stable. There is modest impression on the frontal horn the right lateral ventricle from this edema. There remains 5 mm of midline shift toward the left given this edema. No new mass or edema evident. There is no acute hemorrhage. There is no subdural or epidural fluid collection. There is patchy small vessel disease in the centra semiovale bilaterally. Underlying mild diffuse atrophy is stable. Vascular: There is no appreciable hyperdense vessel. There is calcification in each carotid siphon. Skull: Bony calvarium appears intact.  Bones are osteoporotic. Other: Mastoid air cells are clear. CT MAXILLOFACIAL FINDINGS Osseous: There is no demonstrable fracture or dislocation. No blastic or lytic bone lesions are apparent. Orbits: Orbits appear symmetric bilaterally. No intraorbital lesion. Sinuses: There is mucosal thickening in several ethmoid air cells bilaterally. There is also mucosal thickening in both inferior maxillary antra. There is slight mucosal thickening in the posterolateral aspect the left sphenoid sinus. Other paranasal sinuses are clear. No air-fluid level. No bony destruction or expansion. Ostiomeatal unit complexes are patent bilaterally. There is no nares obstruction. Soft tissues: There is no appreciable soft tissue hematoma or abscess. There is soft tissue swelling over the anterior upper face on the right. Salivary glands appear symmetric bilaterally. No adenopathy evident. Tongue and tongue base regions appear normal. Visualized pharynx appears normal. CT CERVICAL SPINE FINDINGS Alignment: There is chronic reversal of lordotic curvature. There is 1  mm of retrolisthesis of C5 on C6. No other appreciable spondylolisthesis. Skull base and vertebrae: Skull base and craniocervical junction regions appear normal. There is pannus posterior to the odontoid which does not cause significant impression on the craniocervical junction. There is evidence of a prior impaction injury in the inferior aspect of the odontoid, stable from prior study. There is bony remodeling in this area. There is no acute fracture evident. There are no blastic or lytic bone lesions. Bones appear osteoporotic. There is cystic change throughout the odontoid without impending fracture evident. These changes are similar to prior study from 2016. Soft tissues and spinal canal: Prevertebral soft tissues and predental space regions are normal. There are no paraspinous lesions. No cord canal hematoma is demonstrable on this study. There is nuchal ligament calcification in the posterior lower cervical spine region. Disc levels: There is moderately severe disc space narrowing at C3-4, C4-5, C5-6, and C6-7. There is multilevel facet hypertrophy with exit foraminal narrowing due to bony hypertrophy at multiple levels. Exit foraminal narrowing focally is most severe at C3-4 on the left where there is impression on the exiting nerve root due to bony hypertrophy. There is no disc extrusion or high-grade stenosis on this study. Upper chest: No visualized upper lung zones are clear. Other: There is calcification in the right subclavian artery. There is calcification in each carotid artery. IMPRESSION: CT head: Stable known inferior right frontal meningioma with moderate surrounding vasogenic edema. This edema causes mild mass effect on the frontal horn the right lateral ventricle. There is mild midline shift of 5 mm toward the left, similar to recent study. There is no new mass effect or increase in midline shift compared to recent prior CT examination. Elsewhere there is atrophy with periventricular small  vessel disease. No acute infarct evident. No hemorrhage or new mass. No extra-axial fluid. There are foci of arterial vascular calcification. CT maxillofacial: No acute fracture or dislocation. There are areas of mucosal thickening in several paranasal sinuses. No air-fluid levels. Ostiomeatal unit complexes are patent bilaterally. Orbits appear symmetric and unremarkable  bilaterally. CT cervical spine: 1. No acute fracture. Evidence of old trauma with impaction in the inferior aspect of the odontoid with remodeling, stable in appearance compared to prior study from 2016. 2. Minimal spondylolisthesis at C5-6 due to underlying spondylosis. No other spondylolisthesis. 3.  Multilevel arthropathy.  Bones osteoporotic. 4. Foci of calcification in the right subclavian artery and in both carotid arteries. Electronically Signed   By: Lowella Grip III M.D.   On: 08/22/2017 07:23    Assessment/Plan  Uncomplicated UTI  AS she is allergic to Cephalosporins.  Will start her on Cipro 250 BID for 5 days.  Recurrent Falls Patient has been Stable since her Last Fall. All her Labs were Normal. Will treat her for UTI. D/W Patient again to be careful with transfers. Fall precautions Will have to reconsider discontinuing her Eliquis due t recurrent falls.  Atrial fibrillation with RVR  Rate controlled on Eliquis. Her Metoprolol was decreased d in last visit.as her HR was less then 60 If she continues to have recurrent falls will decrease her Eliquis and start her on aspirin Essential hypertension BP controlled   Chronic combined systolic and diastolic congestive heart failure Echo in 2015 showed LVH with EF of 40-45% On Torsemide Stable  Seizure disorder  Dilantin level was normal in 09/18. No Visible Seizures seen  Hypothyroidism SYnthyroid was adjusted for her TSH being high.  Hypokalemia Stable on Supplement and Spirinolactone Anxiety and depression Continue BuSpar. Se is stable Family/  staff Communication:   Labs/tests ordered:

## 2017-09-08 DIAGNOSIS — F4322 Adjustment disorder with anxiety: Secondary | ICD-10-CM | POA: Diagnosis not present

## 2017-09-08 DIAGNOSIS — F0391 Unspecified dementia with behavioral disturbance: Secondary | ICD-10-CM | POA: Diagnosis not present

## 2017-09-09 ENCOUNTER — Encounter (HOSPITAL_COMMUNITY)
Admission: RE | Admit: 2017-09-09 | Discharge: 2017-09-09 | Disposition: A | Discharge status: Home / Self Care | Payer: Medicare Other | Source: Skilled Nursing Facility | Attending: Internal Medicine | Admitting: Internal Medicine

## 2017-09-09 DIAGNOSIS — F329 Major depressive disorder, single episode, unspecified: Secondary | ICD-10-CM | POA: Insufficient documentation

## 2017-09-09 DIAGNOSIS — I5042 Chronic combined systolic (congestive) and diastolic (congestive) heart failure: Secondary | ICD-10-CM | POA: Insufficient documentation

## 2017-09-09 DIAGNOSIS — M6281 Muscle weakness (generalized): Secondary | ICD-10-CM | POA: Insufficient documentation

## 2017-09-09 DIAGNOSIS — D509 Iron deficiency anemia, unspecified: Secondary | ICD-10-CM | POA: Insufficient documentation

## 2017-09-12 ENCOUNTER — Encounter (HOSPITAL_COMMUNITY)
Admission: RE | Admit: 2017-09-12 | Discharge: 2017-09-12 | Disposition: A | Discharge status: Home / Self Care | Payer: Medicare Other | Source: Skilled Nursing Facility | Attending: Pediatrics | Admitting: Pediatrics

## 2017-09-12 DIAGNOSIS — I5042 Chronic combined systolic (congestive) and diastolic (congestive) heart failure: Secondary | ICD-10-CM | POA: Diagnosis not present

## 2017-09-12 DIAGNOSIS — F329 Major depressive disorder, single episode, unspecified: Secondary | ICD-10-CM | POA: Insufficient documentation

## 2017-09-12 DIAGNOSIS — M19011 Primary osteoarthritis, right shoulder: Secondary | ICD-10-CM | POA: Insufficient documentation

## 2017-09-12 DIAGNOSIS — M159 Polyosteoarthritis, unspecified: Secondary | ICD-10-CM | POA: Insufficient documentation

## 2017-09-12 LAB — TSH: TSH: 2.439 u[IU]/mL (ref 0.350–4.500)

## 2017-10-05 ENCOUNTER — Non-Acute Institutional Stay (SKILLED_NURSING_FACILITY): Payer: Medicare Other | Admitting: Internal Medicine

## 2017-10-05 ENCOUNTER — Encounter: Payer: Self-pay | Admitting: Internal Medicine

## 2017-10-05 DIAGNOSIS — R059 Cough, unspecified: Secondary | ICD-10-CM

## 2017-10-05 DIAGNOSIS — J181 Lobar pneumonia, unspecified organism: Secondary | ICD-10-CM | POA: Diagnosis not present

## 2017-10-05 DIAGNOSIS — R05 Cough: Secondary | ICD-10-CM | POA: Diagnosis not present

## 2017-10-05 DIAGNOSIS — R0989 Other specified symptoms and signs involving the circulatory and respiratory systems: Secondary | ICD-10-CM | POA: Diagnosis not present

## 2017-10-05 DIAGNOSIS — J189 Pneumonia, unspecified organism: Secondary | ICD-10-CM
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
## 2017-10-06 ENCOUNTER — Encounter (HOSPITAL_COMMUNITY)
Admission: RE | Admit: 2017-10-06 | Discharge: 2017-10-06 | Disposition: A | Discharge status: Home / Self Care | Payer: Medicare Other | Source: Skilled Nursing Facility | Attending: *Deleted | Admitting: *Deleted

## 2017-10-06 DIAGNOSIS — I5042 Chronic combined systolic (congestive) and diastolic (congestive) heart failure: Secondary | ICD-10-CM | POA: Diagnosis not present

## 2017-10-06 DIAGNOSIS — F329 Major depressive disorder, single episode, unspecified: Secondary | ICD-10-CM | POA: Diagnosis not present

## 2017-10-06 DIAGNOSIS — M159 Polyosteoarthritis, unspecified: Secondary | ICD-10-CM | POA: Diagnosis not present

## 2017-10-06 DIAGNOSIS — M19011 Primary osteoarthritis, right shoulder: Secondary | ICD-10-CM | POA: Diagnosis not present

## 2017-10-06 LAB — CBC WITH DIFFERENTIAL/PLATELET
BASOS ABS: 0 10*3/uL (ref 0.0–0.1)
Basophils Relative: 1 %
EOS ABS: 0.3 10*3/uL (ref 0.0–0.7)
EOS PCT: 4 %
HCT: 42.1 % (ref 36.0–46.0)
Hemoglobin: 13.4 g/dL (ref 12.0–15.0)
LYMPHS PCT: 23 %
Lymphs Abs: 1.4 10*3/uL (ref 0.7–4.0)
MCH: 32.1 pg (ref 26.0–34.0)
MCHC: 31.8 g/dL (ref 30.0–36.0)
MCV: 101 fL — ABNORMAL HIGH (ref 78.0–100.0)
Monocytes Absolute: 0.6 10*3/uL (ref 0.1–1.0)
Monocytes Relative: 10 %
Neutro Abs: 3.7 10*3/uL (ref 1.7–7.7)
Neutrophils Relative %: 62 %
PLATELETS: 127 10*3/uL — AB (ref 150–400)
RBC: 4.17 MIL/uL (ref 3.87–5.11)
RDW: 13.9 % (ref 11.5–15.5)
WBC: 5.9 10*3/uL (ref 4.0–10.5)

## 2017-10-06 LAB — BASIC METABOLIC PANEL
ANION GAP: 12 (ref 5–15)
BUN: 28 mg/dL — ABNORMAL HIGH (ref 6–20)
CHLORIDE: 99 mmol/L — AB (ref 101–111)
CO2: 27 mmol/L (ref 22–32)
Calcium: 8.5 mg/dL — ABNORMAL LOW (ref 8.9–10.3)
Creatinine, Ser: 0.77 mg/dL (ref 0.44–1.00)
GFR calc Af Amer: >60 mL/min (ref >60)
Glucose, Bld: 144 mg/dL — ABNORMAL HIGH (ref 65–99)
Potassium: 4.3 mmol/L (ref 3.5–5.1)
Sodium: 138 mmol/L (ref 135–145)

## 2017-10-10 ENCOUNTER — Encounter (HOSPITAL_COMMUNITY)
Admission: RE | Admit: 2017-10-10 | Discharge: 2017-10-10 | Disposition: A | Discharge status: Home / Self Care | Payer: Medicare Other | Source: Skilled Nursing Facility | Attending: *Deleted | Admitting: *Deleted

## 2017-10-10 DIAGNOSIS — M159 Polyosteoarthritis, unspecified: Secondary | ICD-10-CM | POA: Diagnosis not present

## 2017-10-10 DIAGNOSIS — I5042 Chronic combined systolic (congestive) and diastolic (congestive) heart failure: Secondary | ICD-10-CM | POA: Diagnosis not present

## 2017-10-10 DIAGNOSIS — M19011 Primary osteoarthritis, right shoulder: Secondary | ICD-10-CM | POA: Diagnosis not present

## 2017-10-10 DIAGNOSIS — F329 Major depressive disorder, single episode, unspecified: Secondary | ICD-10-CM | POA: Diagnosis not present

## 2017-10-10 LAB — CBC WITH DIFFERENTIAL/PLATELET
BASOS ABS: 0 10*3/uL (ref 0.0–0.1)
Basophils Relative: 1 %
EOS ABS: 0.2 10*3/uL (ref 0.0–0.7)
EOS PCT: 4 %
HCT: 37.2 % (ref 36.0–46.0)
Hemoglobin: 12 g/dL (ref 12.0–15.0)
Lymphocytes Relative: 28 %
Lymphs Abs: 1.4 10*3/uL (ref 0.7–4.0)
MCH: 32 pg (ref 26.0–34.0)
MCHC: 32.3 g/dL (ref 30.0–36.0)
MCV: 99.2 fL (ref 78.0–100.0)
Monocytes Absolute: 0.4 10*3/uL (ref 0.1–1.0)
Monocytes Relative: 9 %
Neutro Abs: 3 10*3/uL (ref 1.7–7.7)
Neutrophils Relative %: 58 %
PLATELETS: 134 10*3/uL — AB (ref 150–400)
RBC: 3.75 MIL/uL — AB (ref 3.87–5.11)
RDW: 13.4 % (ref 11.5–15.5)
WBC: 5.1 10*3/uL (ref 4.0–10.5)

## 2017-10-13 ENCOUNTER — Encounter: Payer: Self-pay | Admitting: Internal Medicine

## 2017-10-13 ENCOUNTER — Non-Acute Institutional Stay (SKILLED_NURSING_FACILITY): Payer: Medicare Other | Admitting: Internal Medicine

## 2017-10-13 DIAGNOSIS — I4891 Unspecified atrial fibrillation: Secondary | ICD-10-CM

## 2017-10-13 DIAGNOSIS — R262 Difficulty in walking, not elsewhere classified: Secondary | ICD-10-CM | POA: Diagnosis not present

## 2017-10-13 DIAGNOSIS — B351 Tinea unguium: Secondary | ICD-10-CM | POA: Diagnosis not present

## 2017-10-13 DIAGNOSIS — J189 Pneumonia, unspecified organism: Secondary | ICD-10-CM

## 2017-10-13 DIAGNOSIS — I5042 Chronic combined systolic (congestive) and diastolic (congestive) heart failure: Secondary | ICD-10-CM

## 2017-10-13 DIAGNOSIS — J181 Lobar pneumonia, unspecified organism: Secondary | ICD-10-CM | POA: Diagnosis not present

## 2017-10-13 DIAGNOSIS — I739 Peripheral vascular disease, unspecified: Secondary | ICD-10-CM | POA: Diagnosis not present

## 2017-10-13 IMAGING — DX DG SHOULDER 2+V*R*
3 series · 3 of 3 positions shown · non-contrast
Comparison: None.

CLINICAL DATA: Right shoulder pain for 3 days, no known injury,
initial encounter

EXAM:
RIGHT SHOULDER - 2+ VIEW

[shoulder ap]
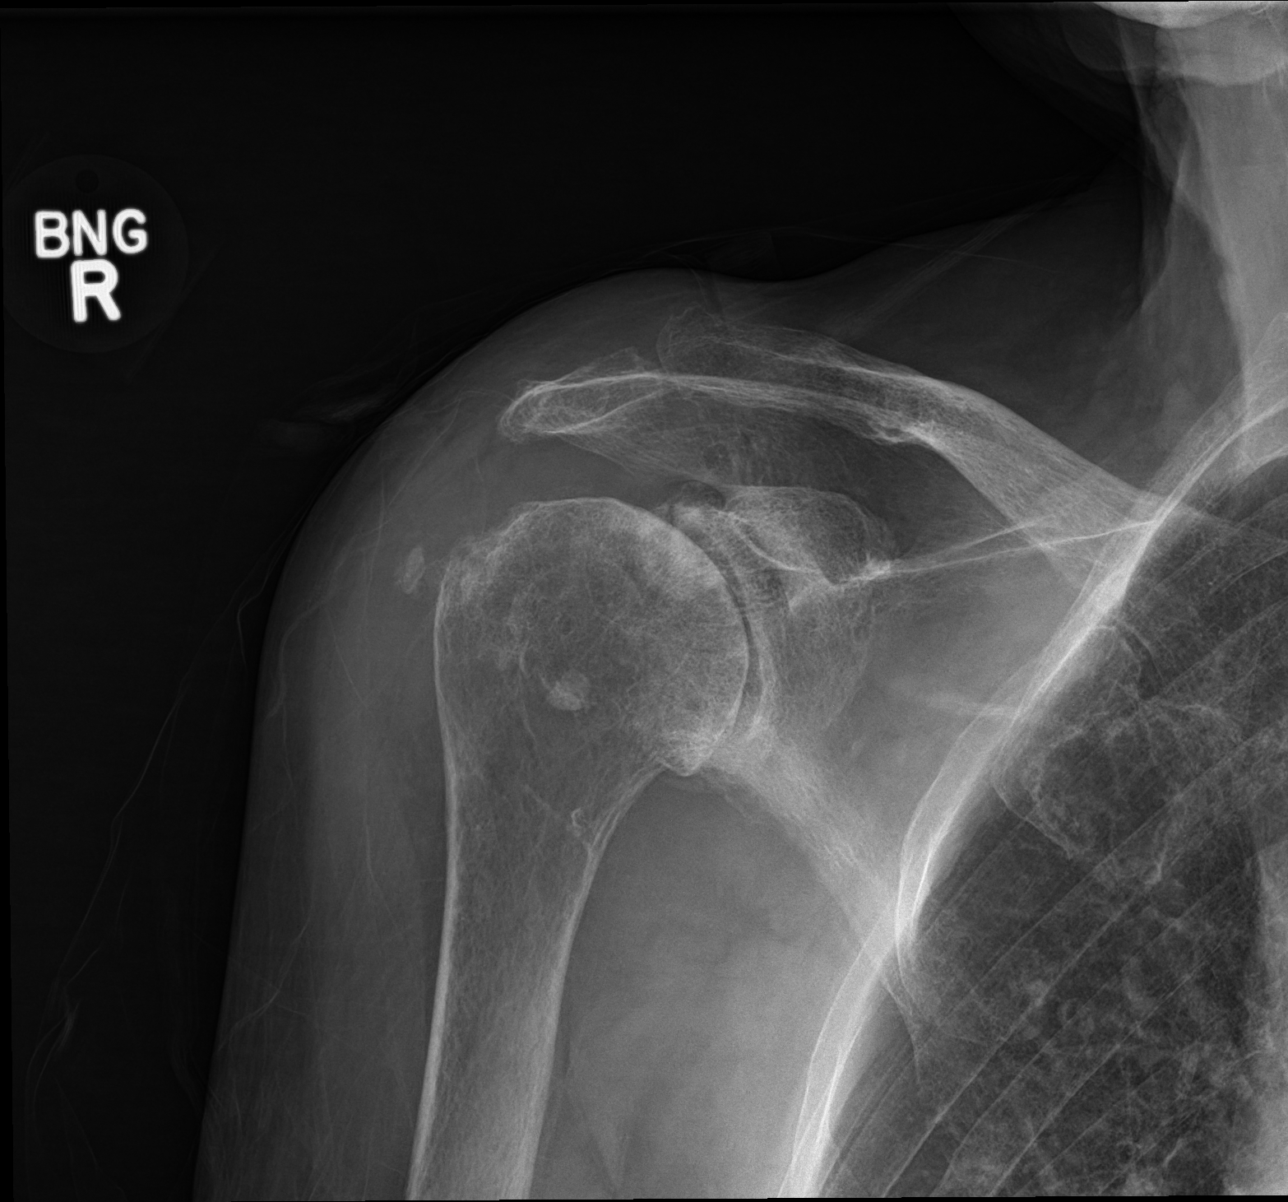

[shoulder y view]
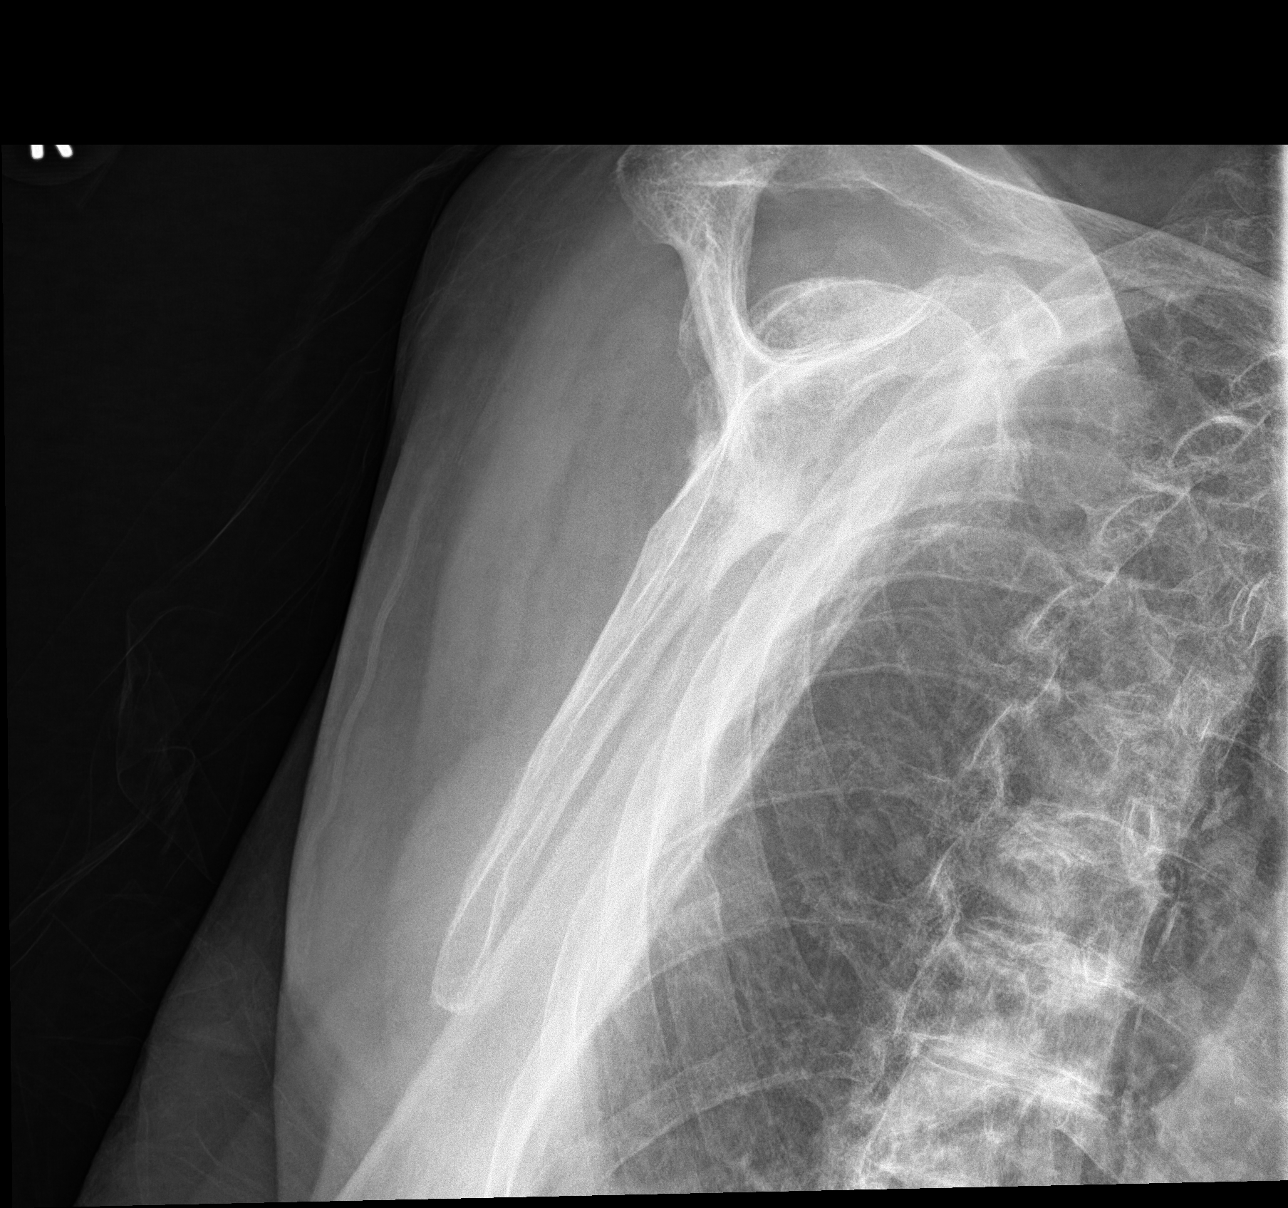

[shoulder axillary]
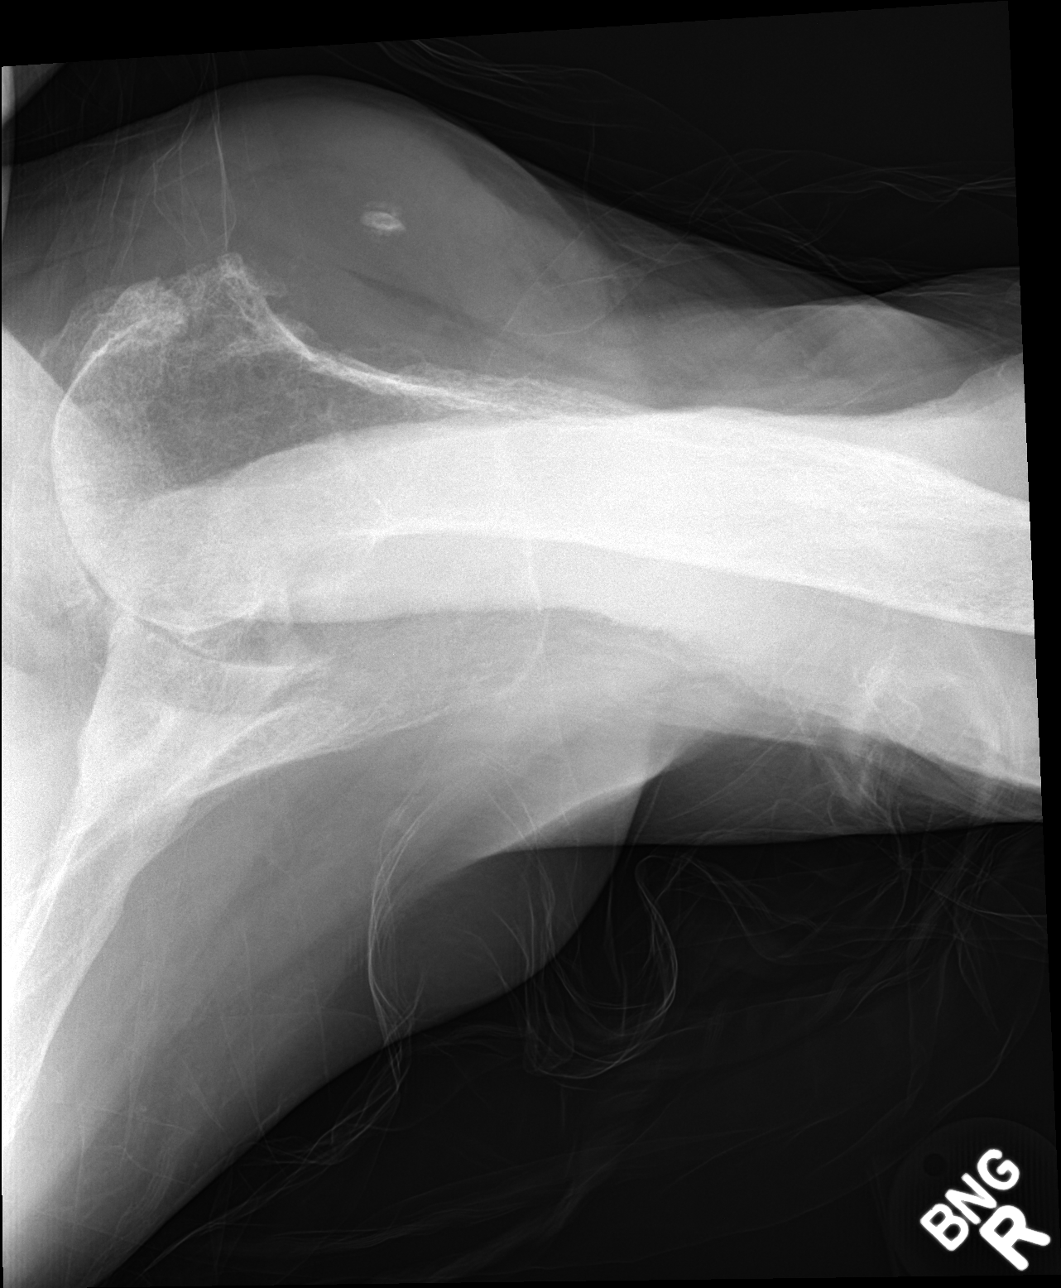

[3 of 3 positions shown; findings below may reference images not displayed]

FINDINGS: Degenerative changes of the glenohumeral articulation are seen. No
acute fracture or dislocation is noted.
IMPRESSION: Degenerative changes without acute abnormality.
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
## 2017-10-18 ENCOUNTER — Non-Acute Institutional Stay (SKILLED_NURSING_FACILITY): Payer: Medicare Other | Admitting: Internal Medicine

## 2017-10-18 ENCOUNTER — Encounter: Payer: Self-pay | Admitting: Internal Medicine

## 2017-10-18 DIAGNOSIS — I4891 Unspecified atrial fibrillation: Secondary | ICD-10-CM

## 2017-10-18 DIAGNOSIS — E038 Other specified hypothyroidism: Secondary | ICD-10-CM | POA: Diagnosis not present

## 2017-10-18 DIAGNOSIS — I10 Essential (primary) hypertension: Secondary | ICD-10-CM

## 2017-10-18 DIAGNOSIS — I5042 Chronic combined systolic (congestive) and diastolic (congestive) heart failure: Secondary | ICD-10-CM | POA: Diagnosis not present

## 2017-10-18 DIAGNOSIS — G40909 Epilepsy, unspecified, not intractable, without status epilepticus: Secondary | ICD-10-CM | POA: Diagnosis not present
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
## 2017-10-27 ENCOUNTER — Encounter (HOSPITAL_COMMUNITY)
Admission: RE | Admit: 2017-10-27 | Discharge: 2017-10-27 | Disposition: A | Discharge status: Home / Self Care | Payer: Medicare Other | Source: Skilled Nursing Facility | Attending: Pediatrics | Admitting: Pediatrics

## 2017-10-27 DIAGNOSIS — I5042 Chronic combined systolic (congestive) and diastolic (congestive) heart failure: Secondary | ICD-10-CM | POA: Insufficient documentation

## 2017-10-27 DIAGNOSIS — M19011 Primary osteoarthritis, right shoulder: Secondary | ICD-10-CM | POA: Insufficient documentation

## 2017-10-27 DIAGNOSIS — F329 Major depressive disorder, single episode, unspecified: Secondary | ICD-10-CM | POA: Diagnosis not present

## 2017-10-27 DIAGNOSIS — M159 Polyosteoarthritis, unspecified: Secondary | ICD-10-CM | POA: Insufficient documentation

## 2017-10-27 LAB — TSH: TSH: 5.332 u[IU]/mL — AB (ref 0.350–4.500)

## 2017-10-27 IMAGING — CT CT CERVICAL SPINE W/O CM
4 of 5 series · 14 of 33 positions shown, 16 images · non-contrast
Comparison: CT 02/04/2013

CLINICAL DATA: Fall

EXAM:
CT HEAD WITHOUT CONTRAST
CT CERVICAL SPINE WITHOUT CONTRAST
TECHNIQUE: Multidetector CT imaging of the head and cervical spine was
performed following the standard protocol without intravenous
contrast. Multiplanar CT image reconstructions of the cervical spine
were also generated.

[Series 6: cervical st 2.0 b31s · axial · 0.27mm/px · z∈[+1230,+1328]mm · 4 of 83 slices shown, 5 images]
[im 17/83  soft-tissue]
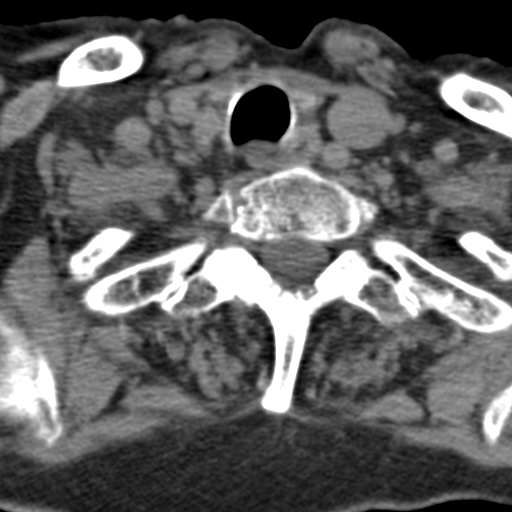
[im 17/83  bone]
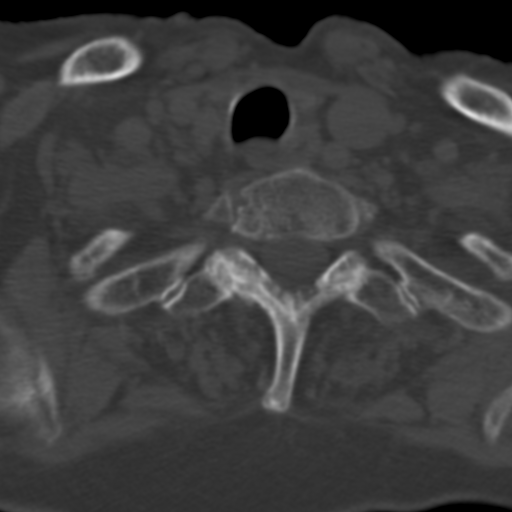
[im 33/83  bone]
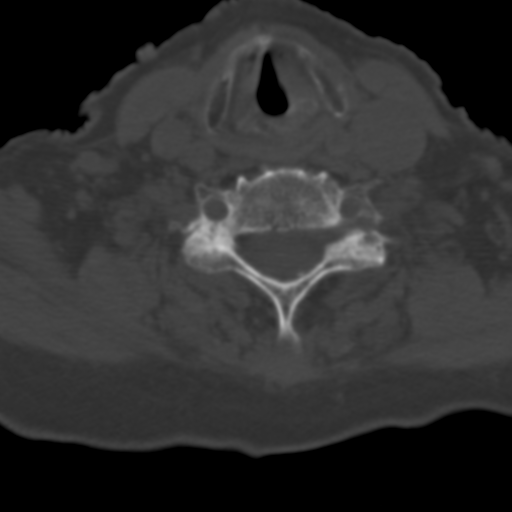
[im 50/83  bone]
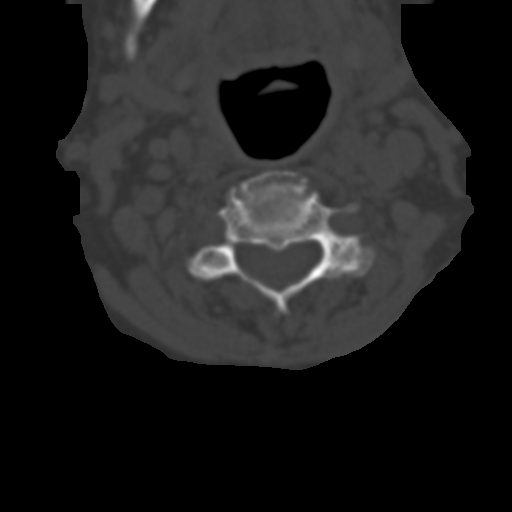
[im 66/83  bone]
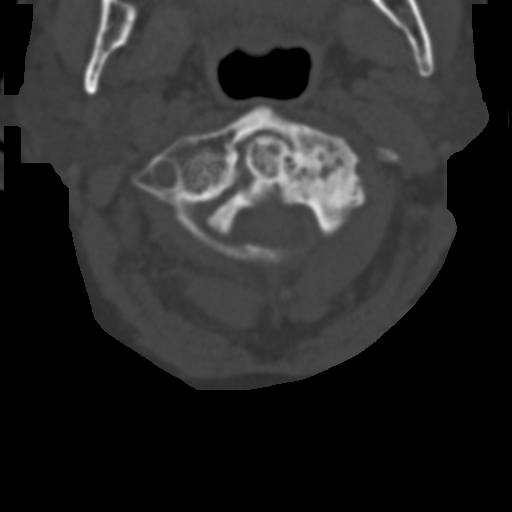

[Series 8: sagittal bone 2.0 · sagittal · 0.26mm/px · 5 of 59 slices shown, 6 images]
[im 20/59  bone]
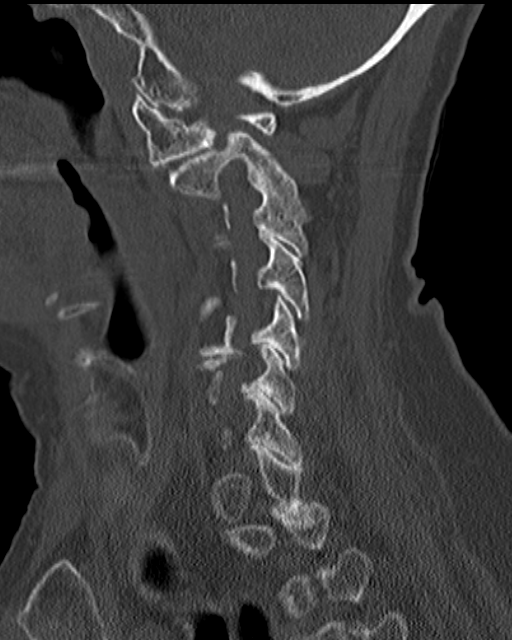
[im 25/59  bone]
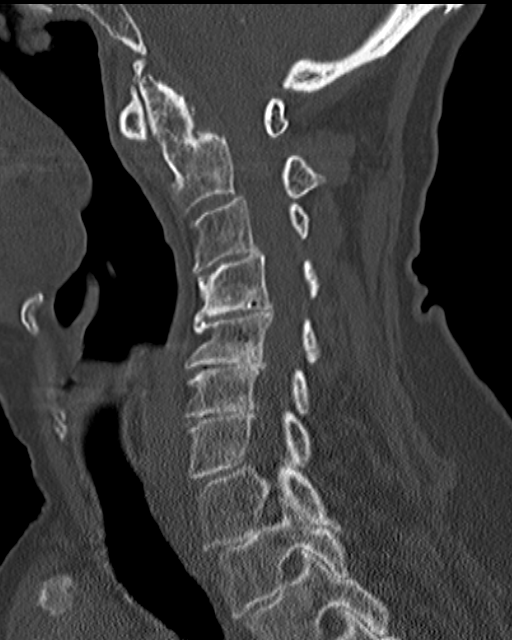
[im 30/59  soft-tissue]
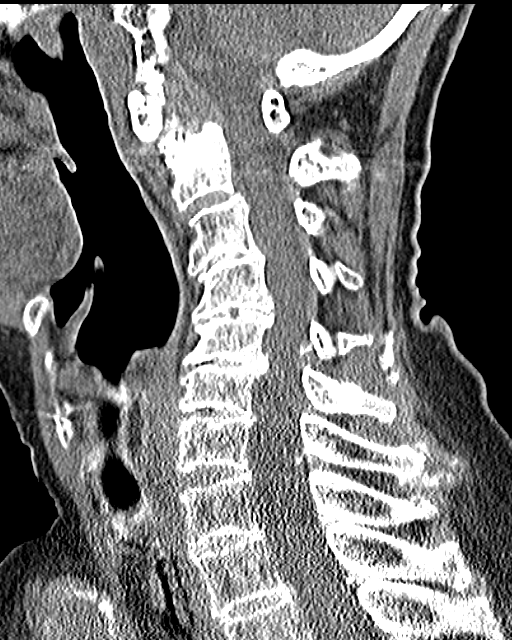
[im 30/59  bone]
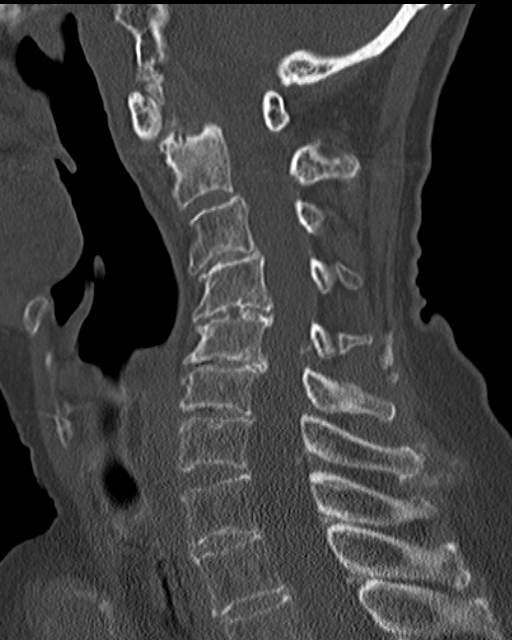
[im 34/59  bone]
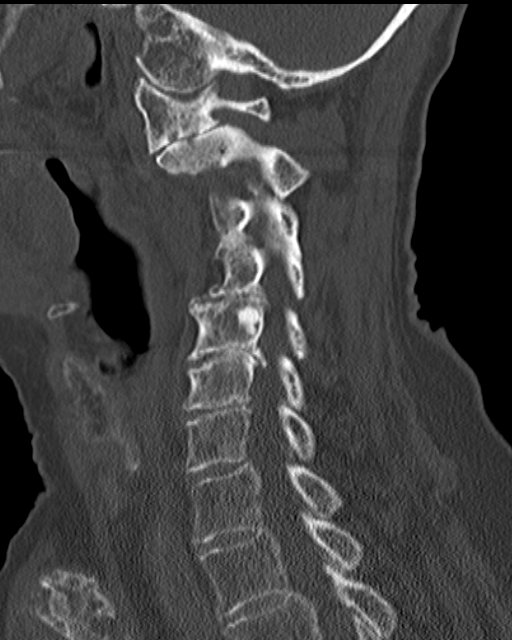
[im 39/59  bone]
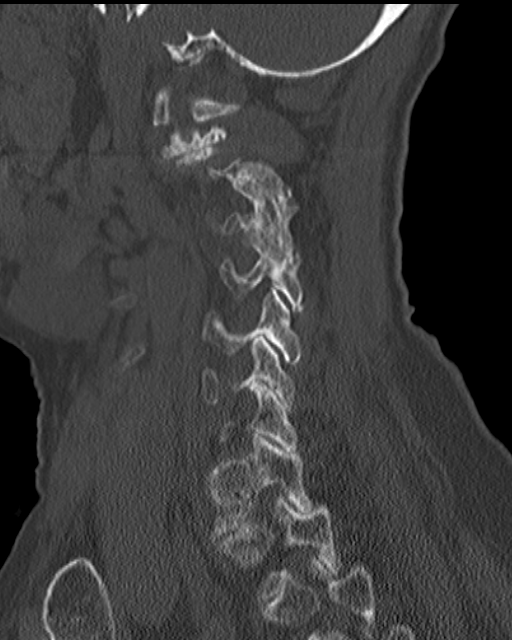

[Series 9: coronal bone 2.0 · coronal · 0.22mm/px · 3 of 53 slices shown]
[im 11/53  bone]
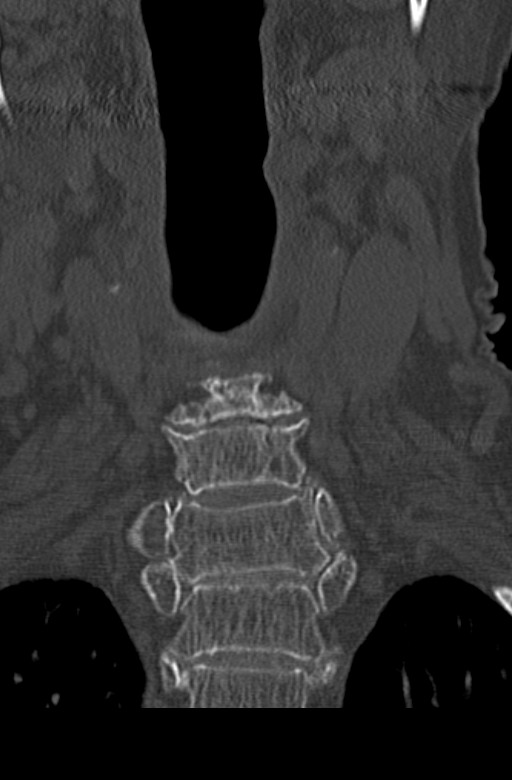
[im 21/53  bone]
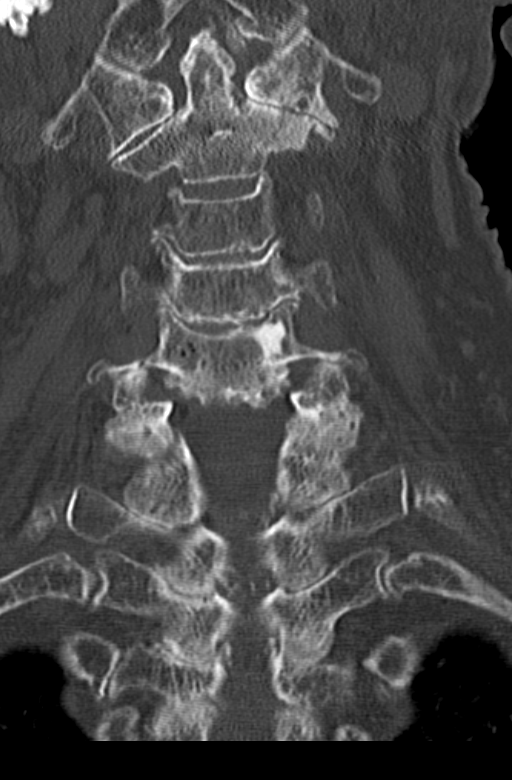
[im 32/53  bone]
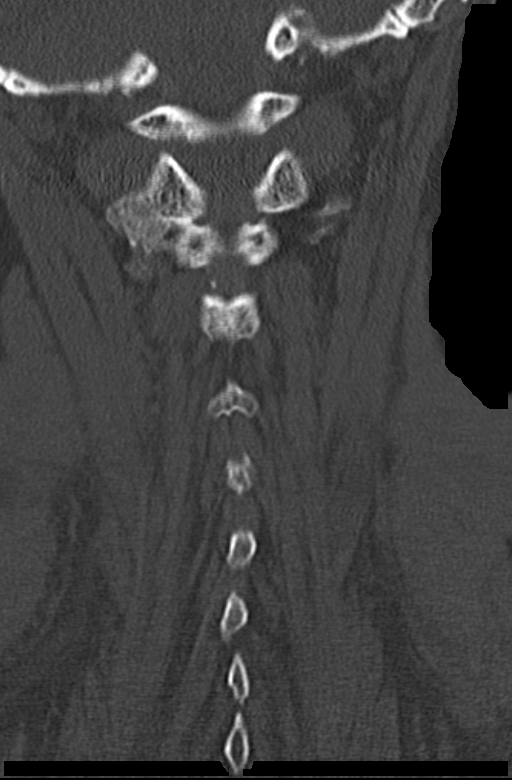

[Series 10: axial bone 2.0 · axial · 0.21mm/px · z∈[+1206,+1238]mm · 2 of 88 slices shown]
[im 18/88  bone]
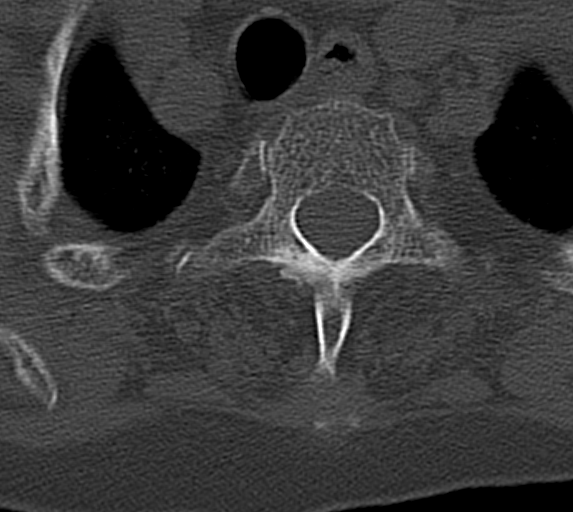
[im 35/88  bone]
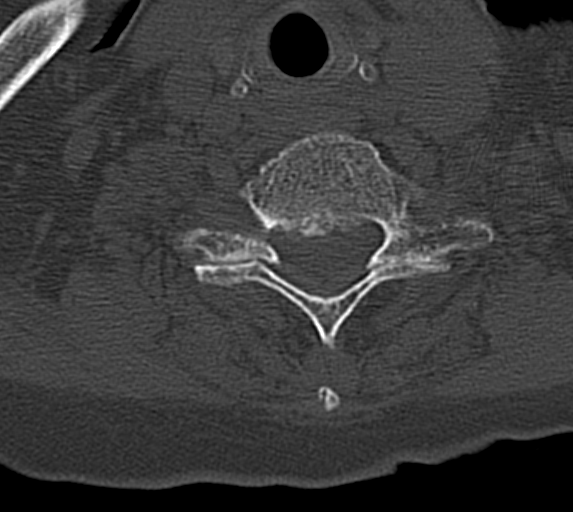

[14 of 33 positions shown; findings below may reference images not displayed]

FINDINGS: CT HEAD FINDINGS

Extra-axial mass lesion in the right middle cranial fossa anterior
the temporal lobe extends superior to the orbit. This mass measures
2.9 x 3.4 cm and shows mild interval growth. Mass is mildly
hyperdense and has a punctate calcification centrally which was
present previously. There is low-density white matter edema
throughout the right frontal lobe as noted previously. Findings are
most consistent with meningioma and vasogenic edema. Mild
mass-effect on the right frontal horn.

Atrophy with mild chronic microvascular ischemia. No acute
hemorrhage. Negative for acute infarct.

Left frontal scalp laceration. Negative for skull fracture. Mild
mucosal edema in the paranasal sinuses.

CT CERVICAL SPINE FINDINGS

Accentuated cervical kyphosis. Multilevel degenerative change with
disc space narrowing and spurring.

Negative for fracture.
IMPRESSION: Right-sided mass lesions shows mild interval growth since 4089.
There is a mild amount of vasogenic edema. Findings most consistent
with sphenoid wing meningioma on the right with

No acute intracranial hemorrhage

Cervical kyphosis and moderate degenerative change. Negative for
cervical spine fracture.

## 2017-11-10 ENCOUNTER — Encounter (HOSPITAL_COMMUNITY)
Admission: RE | Admit: 2017-11-10 | Discharge: 2017-11-10 | Disposition: A | Discharge status: Home / Self Care | Payer: Medicare Other | Source: Skilled Nursing Facility | Attending: Internal Medicine | Admitting: Internal Medicine

## 2017-11-10 ENCOUNTER — Encounter: Payer: Self-pay | Admitting: Internal Medicine

## 2017-11-10 DIAGNOSIS — I5042 Chronic combined systolic (congestive) and diastolic (congestive) heart failure: Secondary | ICD-10-CM | POA: Insufficient documentation

## 2017-11-10 DIAGNOSIS — R569 Unspecified convulsions: Secondary | ICD-10-CM | POA: Insufficient documentation

## 2017-11-10 DIAGNOSIS — D509 Iron deficiency anemia, unspecified: Secondary | ICD-10-CM | POA: Insufficient documentation

## 2017-11-10 DIAGNOSIS — M6281 Muscle weakness (generalized): Secondary | ICD-10-CM | POA: Insufficient documentation

## 2017-11-10 DIAGNOSIS — F329 Major depressive disorder, single episode, unspecified: Secondary | ICD-10-CM | POA: Insufficient documentation

## 2017-11-10 NOTE — Progress Notes (Signed)
This encounter was created in error - please disregard.

## 2017-11-11 ENCOUNTER — Encounter (HOSPITAL_COMMUNITY)
Admission: RE | Admit: 2017-11-11 | Discharge: 2017-11-11 | Disposition: A | Discharge status: Home / Self Care | Payer: Medicare Other | Source: Skilled Nursing Facility | Attending: Pediatrics | Admitting: Pediatrics

## 2017-11-11 DIAGNOSIS — M159 Polyosteoarthritis, unspecified: Secondary | ICD-10-CM | POA: Insufficient documentation

## 2017-11-11 DIAGNOSIS — I5042 Chronic combined systolic (congestive) and diastolic (congestive) heart failure: Secondary | ICD-10-CM | POA: Insufficient documentation

## 2017-11-11 DIAGNOSIS — M19011 Primary osteoarthritis, right shoulder: Secondary | ICD-10-CM | POA: Insufficient documentation

## 2017-11-11 DIAGNOSIS — F329 Major depressive disorder, single episode, unspecified: Secondary | ICD-10-CM | POA: Insufficient documentation

## 2017-11-14 ENCOUNTER — Encounter (HOSPITAL_COMMUNITY)
Admission: RE | Admit: 2017-11-14 | Discharge: 2017-11-14 | Disposition: A | Discharge status: Home / Self Care | Payer: Medicare Other | Source: Skilled Nursing Facility | Attending: *Deleted | Admitting: *Deleted

## 2017-11-21 ENCOUNTER — Encounter: Payer: Self-pay | Admitting: Internal Medicine

## 2017-11-21 ENCOUNTER — Non-Acute Institutional Stay (SKILLED_NURSING_FACILITY): Payer: Medicare Other | Admitting: Internal Medicine

## 2017-11-21 DIAGNOSIS — E038 Other specified hypothyroidism: Secondary | ICD-10-CM

## 2017-11-21 DIAGNOSIS — I5042 Chronic combined systolic (congestive) and diastolic (congestive) heart failure: Secondary | ICD-10-CM | POA: Diagnosis not present

## 2017-11-21 DIAGNOSIS — I4891 Unspecified atrial fibrillation: Secondary | ICD-10-CM | POA: Diagnosis not present

## 2017-11-21 DIAGNOSIS — F329 Major depressive disorder, single episode, unspecified: Secondary | ICD-10-CM

## 2017-11-21 DIAGNOSIS — I10 Essential (primary) hypertension: Secondary | ICD-10-CM | POA: Diagnosis not present

## 2017-11-21 DIAGNOSIS — F419 Anxiety disorder, unspecified: Secondary | ICD-10-CM | POA: Diagnosis not present

## 2017-11-21 DIAGNOSIS — G40909 Epilepsy, unspecified, not intractable, without status epilepticus: Secondary | ICD-10-CM | POA: Diagnosis not present
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
## 2017-11-22 ENCOUNTER — Encounter (HOSPITAL_COMMUNITY)
Admission: RE | Admit: 2017-11-22 | Discharge: 2017-11-22 | Disposition: A | Discharge status: Home / Self Care | Payer: Medicare Other | Source: Skilled Nursing Facility | Attending: Internal Medicine | Admitting: Internal Medicine

## 2017-11-22 DIAGNOSIS — M6281 Muscle weakness (generalized): Secondary | ICD-10-CM | POA: Insufficient documentation

## 2017-11-22 DIAGNOSIS — I5042 Chronic combined systolic (congestive) and diastolic (congestive) heart failure: Secondary | ICD-10-CM | POA: Insufficient documentation

## 2017-11-22 DIAGNOSIS — D509 Iron deficiency anemia, unspecified: Secondary | ICD-10-CM | POA: Insufficient documentation

## 2017-11-22 DIAGNOSIS — F329 Major depressive disorder, single episode, unspecified: Secondary | ICD-10-CM | POA: Insufficient documentation

## 2017-12-07 ENCOUNTER — Encounter (HOSPITAL_COMMUNITY)
Admission: RE | Admit: 2017-12-07 | Discharge: 2017-12-07 | Disposition: A | Discharge status: Home / Self Care | Payer: Medicare Other | Source: Skilled Nursing Facility | Attending: Internal Medicine | Admitting: Internal Medicine

## 2017-12-07 DIAGNOSIS — F329 Major depressive disorder, single episode, unspecified: Secondary | ICD-10-CM | POA: Diagnosis not present

## 2017-12-07 DIAGNOSIS — R293 Abnormal posture: Secondary | ICD-10-CM | POA: Diagnosis not present

## 2017-12-07 DIAGNOSIS — R269 Unspecified abnormalities of gait and mobility: Secondary | ICD-10-CM | POA: Insufficient documentation

## 2017-12-07 DIAGNOSIS — R1310 Dysphagia, unspecified: Secondary | ICD-10-CM | POA: Insufficient documentation

## 2017-12-07 DIAGNOSIS — M6281 Muscle weakness (generalized): Secondary | ICD-10-CM | POA: Diagnosis not present

## 2017-12-07 DIAGNOSIS — I5042 Chronic combined systolic (congestive) and diastolic (congestive) heart failure: Secondary | ICD-10-CM | POA: Insufficient documentation

## 2017-12-07 DIAGNOSIS — D509 Iron deficiency anemia, unspecified: Secondary | ICD-10-CM | POA: Diagnosis not present

## 2017-12-07 LAB — BASIC METABOLIC PANEL
Anion gap: 10 (ref 5–15)
BUN: 27 mg/dL — ABNORMAL HIGH (ref 6–20)
CO2: 27 mmol/L (ref 22–32)
Calcium: 8.6 mg/dL — ABNORMAL LOW (ref 8.9–10.3)
Chloride: 101 mmol/L (ref 101–111)
Creatinine, Ser: 0.87 mg/dL (ref 0.44–1.00)
GFR calc Af Amer: >60 mL/min (ref >60)
GFR calc non Af Amer: 54 mL/min — ABNORMAL LOW (ref >60)
Glucose, Bld: 96 mg/dL (ref 65–99)
POTASSIUM: 3.7 mmol/L (ref 3.5–5.1)
SODIUM: 138 mmol/L (ref 135–145)

## 2017-12-07 LAB — TSH: TSH: 4.771 u[IU]/mL — ABNORMAL HIGH (ref 0.350–4.500)

## 2017-12-07 LAB — VITAMIN B12: Vitamin B-12: 189 pg/mL (ref 180–914)

## 2017-12-08 LAB — VITAMIN D 25 HYDROXY (VIT D DEFICIENCY, FRACTURES): Vit D, 25-Hydroxy: 32.1 ng/mL (ref 30.0–100.0)

## 2017-12-12 ENCOUNTER — Encounter (HOSPITAL_COMMUNITY)
Admission: RE | Admit: 2017-12-12 | Discharge: 2017-12-12 | Disposition: A | Discharge status: Home / Self Care | Payer: Medicare Other | Source: Skilled Nursing Facility | Attending: Pediatrics | Admitting: Pediatrics

## 2017-12-12 DIAGNOSIS — M19011 Primary osteoarthritis, right shoulder: Secondary | ICD-10-CM | POA: Insufficient documentation

## 2017-12-12 DIAGNOSIS — F329 Major depressive disorder, single episode, unspecified: Secondary | ICD-10-CM | POA: Insufficient documentation

## 2017-12-12 DIAGNOSIS — M159 Polyosteoarthritis, unspecified: Secondary | ICD-10-CM | POA: Diagnosis not present

## 2017-12-12 DIAGNOSIS — I5042 Chronic combined systolic (congestive) and diastolic (congestive) heart failure: Secondary | ICD-10-CM | POA: Diagnosis not present

## 2017-12-12 LAB — CBC WITH DIFFERENTIAL/PLATELET
BASOS PCT: 0 %
Basophils Absolute: 0 10*3/uL (ref 0.0–0.1)
Eosinophils Absolute: 0.3 10*3/uL (ref 0.0–0.7)
Eosinophils Relative: 5 %
HEMATOCRIT: 38.7 % (ref 36.0–46.0)
Hemoglobin: 12.4 g/dL (ref 12.0–15.0)
LYMPHS ABS: 1.4 10*3/uL (ref 0.7–4.0)
LYMPHS PCT: 25 %
MCH: 31.9 pg (ref 26.0–34.0)
MCHC: 32 g/dL (ref 30.0–36.0)
MCV: 99.5 fL (ref 78.0–100.0)
MONO ABS: 0.5 10*3/uL (ref 0.1–1.0)
MONOS PCT: 9 %
NEUTROS ABS: 3.4 10*3/uL (ref 1.7–7.7)
Neutrophils Relative %: 61 %
Platelets: 166 10*3/uL (ref 150–400)
RBC: 3.89 MIL/uL (ref 3.87–5.11)
RDW: 13.7 % (ref 11.5–15.5)
WBC: 5.6 10*3/uL (ref 4.0–10.5)

## 2017-12-12 LAB — FOLATE: Folate: 22.2 ng/mL (ref >5.9)

## 2017-12-12 LAB — PHENYTOIN LEVEL, TOTAL: PHENYTOIN LVL: 14.2 ug/mL (ref 10.0–20.0)

## 2017-12-19 ENCOUNTER — Encounter: Payer: Self-pay | Admitting: Internal Medicine

## 2017-12-19 ENCOUNTER — Non-Acute Institutional Stay (SKILLED_NURSING_FACILITY): Payer: Medicare Other | Admitting: Internal Medicine

## 2017-12-19 DIAGNOSIS — R269 Unspecified abnormalities of gait and mobility: Secondary | ICD-10-CM | POA: Diagnosis not present
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
## 2017-12-20 ENCOUNTER — Encounter (HOSPITAL_COMMUNITY): Payer: Self-pay | Admitting: Emergency Medicine

## 2017-12-20 ENCOUNTER — Emergency Department (HOSPITAL_COMMUNITY): Payer: Medicare Other

## 2017-12-20 ENCOUNTER — Inpatient Hospital Stay (HOSPITAL_COMMUNITY)
Admission: EM | Admit: 2017-12-20 | Discharge: 2017-12-23 | DRG: 481 | Disposition: A | Discharge status: Skilled Nursing Facility | Payer: Medicare Other | Attending: Internal Medicine | Admitting: Internal Medicine

## 2017-12-20 DIAGNOSIS — Z79899 Other long term (current) drug therapy: Secondary | ICD-10-CM | POA: Diagnosis not present

## 2017-12-20 DIAGNOSIS — Z881 Allergy status to other antibiotic agents status: Secondary | ICD-10-CM

## 2017-12-20 DIAGNOSIS — S79922A Unspecified injury of left thigh, initial encounter: Secondary | ICD-10-CM | POA: Diagnosis not present

## 2017-12-20 DIAGNOSIS — Y92121 Bathroom in nursing home as the place of occurrence of the external cause: Secondary | ICD-10-CM

## 2017-12-20 DIAGNOSIS — S0990XA Unspecified injury of head, initial encounter: Secondary | ICD-10-CM | POA: Diagnosis not present

## 2017-12-20 DIAGNOSIS — S72402A Unspecified fracture of lower end of left femur, initial encounter for closed fracture: Secondary | ICD-10-CM | POA: Diagnosis not present

## 2017-12-20 DIAGNOSIS — M9702XD Periprosthetic fracture around internal prosthetic left hip joint, subsequent encounter: Secondary | ICD-10-CM | POA: Diagnosis not present

## 2017-12-20 DIAGNOSIS — Z96653 Presence of artificial knee joint, bilateral: Secondary | ICD-10-CM | POA: Diagnosis present

## 2017-12-20 DIAGNOSIS — M9712XA Periprosthetic fracture around internal prosthetic left knee joint, initial encounter: Principal | ICD-10-CM | POA: Diagnosis present

## 2017-12-20 DIAGNOSIS — E039 Hypothyroidism, unspecified: Secondary | ICD-10-CM | POA: Diagnosis present

## 2017-12-20 DIAGNOSIS — Z888 Allergy status to other drugs, medicaments and biological substances status: Secondary | ICD-10-CM

## 2017-12-20 DIAGNOSIS — R402413 Glasgow coma scale score 13-15, at hospital admission: Secondary | ICD-10-CM | POA: Diagnosis present

## 2017-12-20 DIAGNOSIS — I5042 Chronic combined systolic (congestive) and diastolic (congestive) heart failure: Secondary | ICD-10-CM | POA: Diagnosis present

## 2017-12-20 DIAGNOSIS — S7292XA Unspecified fracture of left femur, initial encounter for closed fracture: Secondary | ICD-10-CM

## 2017-12-20 DIAGNOSIS — T1490XA Injury, unspecified, initial encounter: Secondary | ICD-10-CM | POA: Diagnosis not present

## 2017-12-20 DIAGNOSIS — I10 Essential (primary) hypertension: Secondary | ICD-10-CM | POA: Diagnosis present

## 2017-12-20 DIAGNOSIS — G40909 Epilepsy, unspecified, not intractable, without status epilepticus: Secondary | ICD-10-CM | POA: Diagnosis present

## 2017-12-20 DIAGNOSIS — Z419 Encounter for procedure for purposes other than remedying health state, unspecified: Secondary | ICD-10-CM

## 2017-12-20 DIAGNOSIS — S79912A Unspecified injury of left hip, initial encounter: Secondary | ICD-10-CM | POA: Diagnosis not present

## 2017-12-20 DIAGNOSIS — D696 Thrombocytopenia, unspecified: Secondary | ICD-10-CM | POA: Diagnosis present

## 2017-12-20 DIAGNOSIS — Z7901 Long term (current) use of anticoagulants: Secondary | ICD-10-CM | POA: Diagnosis not present

## 2017-12-20 DIAGNOSIS — S99912A Unspecified injury of left ankle, initial encounter: Secondary | ICD-10-CM | POA: Diagnosis not present

## 2017-12-20 DIAGNOSIS — I452 Bifascicular block: Secondary | ICD-10-CM | POA: Diagnosis present

## 2017-12-20 DIAGNOSIS — I480 Paroxysmal atrial fibrillation: Secondary | ICD-10-CM | POA: Diagnosis not present

## 2017-12-20 DIAGNOSIS — S72452A Displaced supracondylar fracture without intracondylar extension of lower end of left femur, initial encounter for closed fracture: Secondary | ICD-10-CM | POA: Diagnosis present

## 2017-12-20 DIAGNOSIS — Z96641 Presence of right artificial hip joint: Secondary | ICD-10-CM | POA: Diagnosis present

## 2017-12-20 DIAGNOSIS — F329 Major depressive disorder, single episode, unspecified: Secondary | ICD-10-CM | POA: Diagnosis present

## 2017-12-20 DIAGNOSIS — D509 Iron deficiency anemia, unspecified: Secondary | ICD-10-CM | POA: Diagnosis not present

## 2017-12-20 DIAGNOSIS — I081 Rheumatic disorders of both mitral and tricuspid valves: Secondary | ICD-10-CM | POA: Diagnosis present

## 2017-12-20 DIAGNOSIS — F419 Anxiety disorder, unspecified: Secondary | ICD-10-CM | POA: Diagnosis present

## 2017-12-20 DIAGNOSIS — D649 Anemia, unspecified: Secondary | ICD-10-CM | POA: Diagnosis present

## 2017-12-20 DIAGNOSIS — I4891 Unspecified atrial fibrillation: Secondary | ICD-10-CM | POA: Diagnosis not present

## 2017-12-20 DIAGNOSIS — R569 Unspecified convulsions: Secondary | ICD-10-CM | POA: Diagnosis not present

## 2017-12-20 DIAGNOSIS — Z4789 Encounter for other orthopedic aftercare: Secondary | ICD-10-CM | POA: Diagnosis not present

## 2017-12-20 DIAGNOSIS — M1990 Unspecified osteoarthritis, unspecified site: Secondary | ICD-10-CM | POA: Diagnosis present

## 2017-12-20 DIAGNOSIS — Z66 Do not resuscitate: Secondary | ICD-10-CM | POA: Diagnosis present

## 2017-12-20 DIAGNOSIS — M79662 Pain in left lower leg: Secondary | ICD-10-CM | POA: Diagnosis not present

## 2017-12-20 DIAGNOSIS — E038 Other specified hypothyroidism: Secondary | ICD-10-CM | POA: Diagnosis not present

## 2017-12-20 DIAGNOSIS — I272 Pulmonary hypertension, unspecified: Secondary | ICD-10-CM | POA: Diagnosis present

## 2017-12-20 DIAGNOSIS — S7291XA Unspecified fracture of right femur, initial encounter for closed fracture: Secondary | ICD-10-CM | POA: Insufficient documentation

## 2017-12-20 DIAGNOSIS — M79605 Pain in left leg: Secondary | ICD-10-CM | POA: Diagnosis not present

## 2017-12-20 DIAGNOSIS — Z9181 History of falling: Secondary | ICD-10-CM | POA: Diagnosis not present

## 2017-12-20 DIAGNOSIS — I110 Hypertensive heart disease with heart failure: Secondary | ICD-10-CM | POA: Diagnosis present

## 2017-12-20 DIAGNOSIS — W010XXA Fall on same level from slipping, tripping and stumbling without subsequent striking against object, initial encounter: Secondary | ICD-10-CM | POA: Diagnosis present

## 2017-12-20 DIAGNOSIS — S199XXA Unspecified injury of neck, initial encounter: Secondary | ICD-10-CM | POA: Diagnosis not present

## 2017-12-20 DIAGNOSIS — S72402D Unspecified fracture of lower end of left femur, subsequent encounter for closed fracture with routine healing: Secondary | ICD-10-CM | POA: Diagnosis not present

## 2017-12-20 DIAGNOSIS — I482 Chronic atrial fibrillation: Secondary | ICD-10-CM | POA: Diagnosis present

## 2017-12-20 DIAGNOSIS — I509 Heart failure, unspecified: Secondary | ICD-10-CM | POA: Diagnosis not present

## 2017-12-20 DIAGNOSIS — R279 Unspecified lack of coordination: Secondary | ICD-10-CM | POA: Diagnosis not present

## 2017-12-20 HISTORY — DX: Unspecified fracture of left femur, initial encounter for closed fracture: S72.92XA

## 2017-12-20 HISTORY — DX: Unspecified fracture of right femur, initial encounter for closed fracture: S72.91XA

## 2017-12-20 LAB — CBC WITH DIFFERENTIAL/PLATELET
BASOS ABS: 0 10*3/uL (ref 0.0–0.1)
Basophils Relative: 0 %
EOS PCT: 4 %
Eosinophils Absolute: 0.3 10*3/uL (ref 0.0–0.7)
HEMATOCRIT: 42.9 % (ref 36.0–46.0)
Hemoglobin: 13.5 g/dL (ref 12.0–15.0)
LYMPHS ABS: 1.6 10*3/uL (ref 0.7–4.0)
LYMPHS PCT: 26 %
MCH: 31.3 pg (ref 26.0–34.0)
MCHC: 31.5 g/dL (ref 30.0–36.0)
MCV: 99.3 fL (ref 78.0–100.0)
MONO ABS: 0.5 10*3/uL (ref 0.1–1.0)
Monocytes Relative: 9 %
NEUTROS ABS: 3.6 10*3/uL (ref 1.7–7.7)
Neutrophils Relative %: 61 %
Platelets: 183 10*3/uL (ref 150–400)
RBC: 4.32 MIL/uL (ref 3.87–5.11)
RDW: 14 % (ref 11.5–15.5)
WBC: 6 10*3/uL (ref 4.0–10.5)

## 2017-12-20 LAB — PROTIME-INR
INR: 0.96
Prothrombin Time: 12.7 seconds (ref 11.4–15.2)

## 2017-12-20 LAB — BASIC METABOLIC PANEL
ANION GAP: 12 (ref 5–15)
BUN: 25 mg/dL — AB (ref 6–20)
CO2: 29 mmol/L (ref 22–32)
Calcium: 9 mg/dL (ref 8.9–10.3)
Chloride: 100 mmol/L — ABNORMAL LOW (ref 101–111)
Creatinine, Ser: 0.88 mg/dL (ref 0.44–1.00)
GFR calc Af Amer: >60 mL/min (ref >60)
GFR calc non Af Amer: 53 mL/min — ABNORMAL LOW (ref >60)
GLUCOSE: 121 mg/dL — AB (ref 65–99)
POTASSIUM: 3.9 mmol/L (ref 3.5–5.1)
Sodium: 141 mmol/L (ref 135–145)

## 2017-12-20 LAB — SURGICAL PCR SCREEN
MRSA, PCR: POSITIVE — AB
Staphylococcus aureus: POSITIVE — AB

## 2017-12-20 MED ORDER — POVIDONE-IODINE 10 % EX SWAB: 2.0000 "application " | Freq: Once | CUTANEOUS | Status: DC

## 2017-12-20 MED ORDER — PHENYTOIN SODIUM EXTENDED 100 MG PO CAPS
100.0000 mg | ORAL_CAPSULE | Freq: Two times a day (BID) | ORAL | Status: DC
  Administered 2017-12-20 – 2017-12-23 (×6): 100 mg via ORAL
  Filled 2017-12-20 (×6): qty 1

## 2017-12-20 MED ORDER — TORSEMIDE 20 MG PO TABS
20.0000 mg | ORAL_TABLET | Freq: Every day | ORAL | Status: DC
  Administered 2017-12-20: 20 mg via ORAL
  Filled 2017-12-20: qty 1

## 2017-12-20 MED ORDER — MORPHINE SULFATE (PF) 2 MG/ML IV SOLN
0.5000 mg | INTRAVENOUS | Status: DC | PRN
Start: 2017-12-20 — End: 2017-12-23
  Administered 2017-12-20 – 2017-12-21 (×2): 0.5 mg via INTRAVENOUS
  Filled 2017-12-20 (×2): qty 1

## 2017-12-20 MED ORDER — CHLORHEXIDINE GLUCONATE CLOTH 2 % EX PADS
6.0000 | MEDICATED_PAD | Freq: Every day | CUTANEOUS | Status: DC
  Administered 2017-12-21 – 2017-12-23 (×3): 6 via TOPICAL

## 2017-12-20 MED ORDER — CHLORHEXIDINE GLUCONATE 4 % EX LIQD: 60.0000 mL | Freq: Once | CUTANEOUS | Status: DC

## 2017-12-20 MED ORDER — HYDROCODONE-ACETAMINOPHEN 5-325 MG PO TABS
1.0000 | ORAL_TABLET | Freq: Four times a day (QID) | ORAL | Status: DC | PRN
  Administered 2017-12-20 – 2017-12-23 (×6): 1 via ORAL
  Filled 2017-12-20 (×6): qty 1

## 2017-12-20 MED ORDER — BUSPIRONE HCL 5 MG PO TABS
5.0000 mg | ORAL_TABLET | Freq: Two times a day (BID) | ORAL | Status: DC
  Administered 2017-12-20 – 2017-12-23 (×6): 5 mg via ORAL
  Filled 2017-12-20 (×7): qty 1

## 2017-12-20 MED ORDER — CLINDAMYCIN PHOSPHATE 900 MG/50ML IV SOLN
900.0000 mg | INTRAVENOUS | Status: AC
  Administered 2017-12-21: 900 mg via INTRAVENOUS
  Filled 2017-12-20: qty 50

## 2017-12-20 MED ORDER — LEVOTHYROXINE SODIUM 75 MCG PO TABS
175.0000 ug | ORAL_TABLET | Freq: Every day | ORAL | Status: DC
  Administered 2017-12-20 – 2017-12-23 (×3): 175 ug via ORAL
  Filled 2017-12-20 (×3): qty 1

## 2017-12-20 MED ORDER — MUPIROCIN 2 % EX OINT
1.0000 "application " | TOPICAL_OINTMENT | Freq: Two times a day (BID) | CUTANEOUS | Status: DC
  Administered 2017-12-20 – 2017-12-23 (×7): 1 via NASAL
  Filled 2017-12-20: qty 22

## 2017-12-20 MED ORDER — ENSURE ENLIVE PO LIQD
237.0000 mL | Freq: Two times a day (BID) | ORAL | Status: DC
  Administered 2017-12-22 – 2017-12-23 (×3): 237 mL via ORAL

## 2017-12-20 MED ORDER — PHENYTOIN 50 MG PO CHEW
25.0000 mg | CHEWABLE_TABLET | Freq: Two times a day (BID) | ORAL | Status: DC
  Administered 2017-12-20 – 2017-12-23 (×6): 25 mg via ORAL
  Filled 2017-12-20 (×7): qty 0.5

## 2017-12-20 MED ORDER — SPIRONOLACTONE 25 MG PO TABS
25.0000 mg | ORAL_TABLET | Freq: Two times a day (BID) | ORAL | Status: DC
  Administered 2017-12-20 – 2017-12-23 (×6): 25 mg via ORAL
  Filled 2017-12-20 (×6): qty 1

## 2017-12-20 MED ORDER — PHENYTOIN 50 MG PO CHEW
25.0000 mg | CHEWABLE_TABLET | Freq: Two times a day (BID) | ORAL | Status: DC
  Administered 2017-12-20: 25 mg via ORAL
  Filled 2017-12-20 (×2): qty 0.5

## 2017-12-20 MED ORDER — ONDANSETRON HCL 4 MG/2ML IJ SOLN
4.0000 mg | Freq: Once | INTRAMUSCULAR | Status: AC
  Administered 2017-12-20: 4 mg via INTRAVENOUS
  Filled 2017-12-20: qty 2

## 2017-12-20 MED ORDER — LEVOTHYROXINE SODIUM 25 MCG PO TABS
12.5000 ug | ORAL_TABLET | Freq: Every day | ORAL | Status: DC
  Administered 2017-12-20 – 2017-12-23 (×3): 12.5 ug via ORAL
  Filled 2017-12-20 (×3): qty 1

## 2017-12-20 MED ORDER — MORPHINE SULFATE (PF) 2 MG/ML IV SOLN
2.0000 mg | Freq: Once | INTRAVENOUS | Status: AC
  Administered 2017-12-20: 2 mg via INTRAVENOUS
  Filled 2017-12-20: qty 1

## 2017-12-20 MED ORDER — CLINDAMYCIN PHOSPHATE 900 MG/50ML IV SOLN
900.0000 mg | INTRAVENOUS | Status: AC
  Filled 2017-12-20: qty 50

## 2017-12-20 MED ORDER — METOPROLOL SUCCINATE ER 25 MG PO TB24
25.0000 mg | ORAL_TABLET | Freq: Every day | ORAL | Status: DC
  Administered 2017-12-20 – 2017-12-23 (×4): 25 mg via ORAL
  Filled 2017-12-20 (×4): qty 1

## 2017-12-20 MED ORDER — POTASSIUM CHLORIDE CRYS ER 20 MEQ PO TBCR
40.0000 meq | EXTENDED_RELEASE_TABLET | Freq: Every day | ORAL | Status: DC
  Administered 2017-12-20 – 2017-12-23 (×3): 40 meq via ORAL
  Filled 2017-12-20 (×3): qty 2

## 2017-12-20 MED ORDER — SODIUM CHLORIDE 0.9 % IV BOLUS (SEPSIS)
250.0000 mL | Freq: Once | INTRAVENOUS | Status: AC
  Administered 2017-12-20: 250 mL via INTRAVENOUS

## 2017-12-20 MED ORDER — PHENYTOIN SODIUM EXTENDED 100 MG PO CAPS
100.0000 mg | ORAL_CAPSULE | Freq: Two times a day (BID) | ORAL | Status: DC
  Administered 2017-12-20: 100 mg via ORAL
  Filled 2017-12-20 (×2): qty 1

## 2017-12-20 NOTE — Progress Notes (Signed)
Patient has arrived to [facility] from [facility].

## 2017-12-20 NOTE — ED Notes (Signed)
[facility] at bedside for transport to [facility]

## 2017-12-20 NOTE — Progress Notes (Signed)
Called Dr. Doreatha Martin and verified orders. Per Dr. Virgie Dad, PA will be coming to see pt this morning. Pt switched from NPO to Reg diet with plan of surgery tomorrow per Dr. Doreatha Martin.

## 2017-12-20 NOTE — ED Triage Notes (Signed)
Per [facility] staff pt has had 2 recent falls, left knee swollen and painful

## 2017-12-20 NOTE — ED Provider Notes (Addendum)
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 severe pain of the left knee.  Examination revealed crepitance with movement of the knee.  There was also some apparent shortening of the left leg.  Her left hip x-ray, however, does not show acute fracture.  She is not complaining of pain in this area.  She has severe osteoarthritis of the left knee that has caused chronic deformity but is unchanged from previous.  X-ray of the knee reveals distal femur fracture around total knee replacement prosthesis with prosthesis intact.  Patient is on Eliquis because of a history of atrial fibrillation.  Discussed with Dr. Percell Miller, on-call for orthopedics at [facility].  Patient will be transferred to [facility] for him to manage the fracture.  He asks that the patient's Eliquis be held and to keep the patient n.p.o. in the event that she can be operated on  today.  Patient is on Eliquis and is in chronic atrial fibrillation, not appropriate for  orthopedics admission.  Hospitalist will admit the patient.  Final Clinical Impressions(s) / ED Diagnoses   Final diagnoses:  Closed fracture of distal end of left femur, unspecified fracture morphology, initial encounter [facility])    ED Discharge Orders    None       Orpah Greek, MD 12/20/17 0037    Orpah Greek, MD 12/20/17 [phone]

## 2017-12-20 NOTE — Progress Notes (Addendum)
Erica [name]  is a 84 y.o. female, with history of chronic atrial fibrillation on Eliquis, chronic combined systolic and diastolic CHF, depression, essential hypertension, hyperlipidemia, hypothyroidism, moderate to severe pulmonary hypertension, seizures was brought to the hospital from [facility] for evaluation after a fall.  Patient says that she was in the bathroom and she lost her balance and fell on her left knee.  She complained of severe pain of the knee with inability to stand on it. She denies passing out.  12/20/2017: Patient seen and examined at her bedside.  Denies pain in her left lower extremity when she does not move.  No new complaints.  Admitted for left femur fracture.  ORIF planned tomorrow by Dr. Doreatha Martin.  The patient will be n.p.o. after midnight.  Please refer to H&P dictated by Dr. Darrick Meigs on 12/20/2017 for further details on the assessment and plan.

## 2017-12-20 NOTE — ED Notes (Signed)
Pt placed on 2L [place] at this time. 

## 2017-12-20 NOTE — Clinical Social Work Note (Signed)
Clinical Social Work Assessment  Patient Details  Name: Erica [name] MRN: [id number] Date of Birth: 22-Oct-1916  Date of referral:  12/20/17               Reason for consult:  Facility Placement                Permission sought to share information with:  Facility Art therapist granted to share information::  Yes, Verbal Permission Granted  Name::     brother-n-law, son  Agency::  SNF  Relationship::     Contact Information:     Housing/Transportation Living arrangements for the past 2 months:  [place] of Information:  Other (Comment Required)(Family at bedside) Patient Interpreter Needed:  None Criminal Activity/Legal Involvement Pertinent to Current Situation/Hospitalization:  No - Comment as needed Significant Relationships:  Adult Children, Other Family Members Lives with:  Facility Resident Do you feel safe going back to the place where you live?  Yes Need for family participation in patient care:  Yes (Comment)  Care giving concerns:  Patient is a long term resident at [facility] and can return for [place]. Pt oriented to self, and quiet during CSW visit. Family(brother in law, female) at bedside, confirmed that patient is resident at [facility].  CSW obtained permission to send pt back to [facility] as the plan is for patient to return for skilled nursing once surgery completed.  No other issues or concerns identified.   CSW then contacted son Roselie Awkward who resides in [place] to discuss the CSW role in assisting with returning to [facility]. He indicated that he will be flying into [facility] Thursday. CSW advised him that CSW will be out on Friday however another CSW will assist with disposition. Son voiced understanding and agreement. No other issues were identified.  Social Worker assessment / plan:  CSW will follow and once surgery complete, CSW will continue with disposition and assisting with  patient returning to [place] for care.  Employment status:  Retired Forensic scientist:  Medicare PT Recommendations:  [place] / Referral to community resources:  [place]  Patient/Family's Response to care:  Family at bedside appreciative of [place] meeting to confirm the plan as they were already aware that patient will return to nursing facility as that is where she resides. No other issues or concerns.  Patient/Family's Understanding of and Emotional Response to Diagnosis, Current Treatment, and Prognosis:  Family has good understanding of the injury and are agreeable with patient returning to [facility] for short term rehab as patient is a long term resident at facility. Family in agreement and present no issues at this time. Pt scheduled for surgery on 3/13. CSW will continue to follow for disposition.   Emotional Assessment Appearance:  Appears stated age Attitude/Demeanor/Rapport:  (Quiet and alert) Affect (typically observed):  Quiet Orientation:  Oriented to Self Alcohol / Substance use:  Not Applicable Psych involvement (Current and /or in the community):  No (Comment)  Discharge Needs  Concerns to be addressed:  Discharge Planning Concerns Readmission within the last 30 days:  No Current discharge risk:  Dependent with Mobility, Physical Impairment Barriers to Discharge:  No Barriers Identified   Normajean Baxter, LCSW 12/20/2017, 2:43 PM

## 2017-12-20 NOTE — ED Notes (Signed)
Attempt to notify [facility] of pt transfer to [facility]
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
## 2017-12-20 NOTE — H&P (Addendum)
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 osteoarthritis of left hip joint with deformity of femoral head. Stable chronic fracture deformities of left superior and inferior pubic rami. No acute pelvic fracture identified. Electronically Signed   By: Kristine Garbe M.D.   On: 12/20/2017 04:15      Assessment & Plan:    Active Problems:   Hypertension   Hypothyroidism   Seizure disorder ([place])   Femur fracture, left ([place])   1. Left femur fracture-patient will need surgery at [facility].  Dr. Percell Miller was consulted by ED physician and he recommended transfer to [facility].  Will initiate femur fracture protocol. 2. Atrial fibrillation-patient has history of atrial fibrillation and is currently on  anticoagulation with Eliquis.  Will hold Eliquis in anticipation for surgery.  Continue  metoprolol for rate control.  Will obtain EKG. 3. Seizure disorder-continue phenytoin 125 mg p.o. twice daily 4. Hypothyroidism-continue Synthroid 5. Chronic systolic CHF-chronic combined systolic and diastolic, continue Demadex, Aldactone. 6. Risk stratification-patient is moderate to severe risk for surgery due to advanced age.   DVT Prophylaxis-   Eliquis  AM Labs Ordered, also please review Full Orders  Family Communication: Admission, patients condition and plan of care including tests being ordered have been discussed with the patient who indicate understanding and agree with the plan and Code Status.  Code Status:  DNR  Admission status: Inpatient    Time spent in minutes : 60 minutes   Oswald Hillock M.D on 12/20/2017 at 5:45 AM  Between 7am to 7pm - Pager - [phone]. After 7pm go to [email or web address] - password [facility]  [facility] - Office  [phone]
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
## 2017-12-20 NOTE — ED Notes (Signed)
Report to Jocelyn with [facility]  

## 2017-12-20 NOTE — Anesthesia Preprocedure Evaluation (Addendum)
Anesthesia Evaluation  Patient identified by MRN, date of birth, ID band Patient awake    Reviewed: Allergy & Precautions, NPO status , Unable to perform ROS - Chart review only  Airway Mallampati: II  TM Distance: <3 FB Neck ROM: Full    Dental no notable dental hx. (+) Dental Advisory Given   Pulmonary neg pulmonary ROS,    breath sounds clear to auscultation       Cardiovascular hypertension, Pt. on medications +CHF  + dysrhythmias Atrial Fibrillation  Rhythm:Irregular Rate:Normal + Systolic murmurs Known severe Pulm HTN   Neuro/Psych Seizures -,  Anxiety Depression C2 fracture    GI/Hepatic negative GI ROS, Neg liver ROS,   Endo/Other  Hypothyroidism   Renal/GU negative Renal ROS     Musculoskeletal  (+) Arthritis ,   Abdominal   Peds  Hematology negative hematology ROS (+)   Anesthesia Other Findings   Reproductive/Obstetrics                          Lab Results  Component Value Date   WBC 6.0 12/20/2017   HGB 13.5 12/20/2017   HCT 42.9 12/20/2017   MCV 99.3 12/20/2017   PLT 183 12/20/2017   Lab Results  Component Value Date   CREATININE 0.88 12/20/2017   BUN 25 (H) 12/20/2017   NA 141 12/20/2017   K 3.9 12/20/2017   CL 100 (L) 12/20/2017   CO2 29 12/20/2017   Echo 2015: - Moderate LVH with LVEF in the range of 40-45%, diffusehypokinesis, possible grade 2 diastolic dysfunction. Severe leftatrial enlargement. MAC with mildly thickened mitral leaflets andmild to moderate mitral regurgitation. Mildly sclerotic aorticvalve. Mild RV dilatation with mildly reduced contraction.Moderate tricuspid regurgitation with evidence of moderate tosevere pulmonary hypertension, PASP 59 mmHg with elevated CVP.Trivial pericardial effusion as well as larger left pleuraleffusion noted. Anesthesia Physical Anesthesia Plan  ASA: IV  Anesthesia Plan: General   Post-op Pain Management:     Induction: Intravenous  PONV Risk Score and Plan: 3 and Treatment may vary due to age or medical condition and Ondansetron  Airway Management Planned: Oral ETT and Video Laryngoscope Planned  Additional Equipment:   Intra-op Plan:   Post-operative Plan: Extubation in OR  Informed Consent:   Dental Advisory Given  Plan Discussed with: Anesthesiologist, Surgeon and CRNA  Anesthesia Plan Comments:     Anesthesia Quick Evaluation

## 2017-12-20 NOTE — Progress Notes (Signed)
Initial Nutrition Assessment  DOCUMENTATION CODES:   Not applicable  INTERVENTION:    Ensure Enlive po BID, each supplement provides 350 kcal and 20 grams of protein  NUTRITION DIAGNOSIS:   Increased nutrient needs related to hip fracture, post-op healing as evidenced by estimated needs  GOAL:   Patient will meet greater than or equal to 90% of their needs  MONITOR:   PO intake, Supplement acceptance, Labs, Skin, Weight trends  REASON FOR ASSESSMENT:   Consult Assessment of nutrition requirement/status  ASSESSMENT:   84 y.o. Female with PMH of chronic atrial fibrillation on Eliquis, chronic combined systolic and diastolic CHF, depression, moderate to severe pulmonary hypertension, seizures was brought to the hospital from [facility] for evaluation after a fall.   RD spoke with RN. Pt confused. PO intake poor at 25% per flowsheet records. Would benefit from addition of oral nutrition supplements. Pt with distal femur fx. Plan is for ORIF tomorrow by Dr. Doreatha Martin. Labs and medications reviewed.  NUTRITION - FOCUSED PHYSICAL EXAM:  Unable to complete at this time.  Diet Order:  Diet regular Room service appropriate? Yes; Fluid consistency: Thin Diet NPO time specified Except for: Sips with Meds  EDUCATION NEEDS:   Not appropriate for education at this time  Skin:  Skin Assessment: Reviewed RN Assessment  Last BM:  PTA  Height:   Ht Readings from Last 1 Encounters:  12/20/17 5\' 5"  (1.651 m)   Weight:   Wt Readings from Last 1 Encounters:  12/20/17 150 lb (68 kg)   Ideal Body Weight:  56.8 kg  BMI:  Body mass index is 24.96 kg/m.  Estimated Nutritional Needs:  Kcal:  1200-1400  Protein:  70-85 gm  Fluid:  >/= 1.5 L  Arthur Holms, RD, LDN Pager #: [phone] After-Hours Pager #: [phone]

## 2017-12-21 ENCOUNTER — Inpatient Hospital Stay (HOSPITAL_COMMUNITY): Payer: Medicare Other | Admitting: Anesthesiology

## 2017-12-21 ENCOUNTER — Encounter (HOSPITAL_COMMUNITY)
Admission: EM | Disposition: A | Discharge status: Skilled Nursing Facility | Payer: Self-pay | Source: Home / Self Care | Attending: Internal Medicine

## 2017-12-21 ENCOUNTER — Inpatient Hospital Stay (HOSPITAL_COMMUNITY): Payer: Medicare Other

## 2017-12-21 ENCOUNTER — Encounter (HOSPITAL_COMMUNITY): Payer: Self-pay | Admitting: *Deleted

## 2017-12-21 DIAGNOSIS — S72402A Unspecified fracture of lower end of left femur, initial encounter for closed fracture: Secondary | ICD-10-CM

## 2017-12-21 DIAGNOSIS — S7292XA Unspecified fracture of left femur, initial encounter for closed fracture: Secondary | ICD-10-CM

## 2017-12-21 DIAGNOSIS — E038 Other specified hypothyroidism: Secondary | ICD-10-CM

## 2017-12-21 DIAGNOSIS — G40909 Epilepsy, unspecified, not intractable, without status epilepticus: Secondary | ICD-10-CM

## 2017-12-21 DIAGNOSIS — I10 Essential (primary) hypertension: Secondary | ICD-10-CM

## 2017-12-21 HISTORY — PX: ORIF FEMUR FRACTURE: SHX2119

## 2017-12-21 LAB — CBC
HEMATOCRIT: 33.4 % — AB (ref 36.0–46.0)
Hemoglobin: 10.8 g/dL — ABNORMAL LOW (ref 12.0–15.0)
MCH: 31.6 pg (ref 26.0–34.0)
MCHC: 32.3 g/dL (ref 30.0–36.0)
MCV: 97.7 fL (ref 78.0–100.0)
Platelets: 136 10*3/uL — ABNORMAL LOW (ref 150–400)
RBC: 3.42 MIL/uL — ABNORMAL LOW (ref 3.87–5.11)
RDW: 13.9 % (ref 11.5–15.5)
WBC: 8.6 10*3/uL (ref 4.0–10.5)

## 2017-12-21 LAB — BRAIN NATRIURETIC PEPTIDE: B Natriuretic Peptide: 68.1 pg/mL (ref 0.0–100.0)

## 2017-12-21 SURGERY — OPEN REDUCTION INTERNAL FIXATION (ORIF) DISTAL FEMUR FRACTURE
Anesthesia: General | Laterality: Left

## 2017-12-21 MED ORDER — ROCURONIUM BROMIDE 100 MG/10ML IV SOLN
INTRAVENOUS | Status: DC | PRN
  Administered 2017-12-21: 40 mg via INTRAVENOUS

## 2017-12-21 MED ORDER — PROPOFOL 10 MG/ML IV BOLUS
INTRAVENOUS | Status: AC
  Filled 2017-12-21: qty 20

## 2017-12-21 MED ORDER — PHENYLEPHRINE HCL 10 MG/ML IJ SOLN
INTRAMUSCULAR | Status: DC | PRN
  Administered 2017-12-21: 40 ug via INTRAVENOUS
  Administered 2017-12-21: 80 ug via INTRAVENOUS
  Administered 2017-12-21: 120 ug via INTRAVENOUS
  Administered 2017-12-21: 80 ug via INTRAVENOUS

## 2017-12-21 MED ORDER — DEXAMETHASONE SODIUM PHOSPHATE 10 MG/ML IJ SOLN
INTRAMUSCULAR | Status: AC
  Filled 2017-12-21: qty 1

## 2017-12-21 MED ORDER — PHENYLEPHRINE 40 MCG/ML (10ML) SYRINGE FOR IV PUSH (FOR BLOOD PRESSURE SUPPORT)
PREFILLED_SYRINGE | INTRAVENOUS | Status: AC
  Filled 2017-12-21: qty 10

## 2017-12-21 MED ORDER — BACITRACIN ZINC 500 UNIT/GM EX OINT
TOPICAL_OINTMENT | CUTANEOUS | Status: AC
  Filled 2017-12-21: qty 28.35

## 2017-12-21 MED ORDER — VANCOMYCIN HCL 1000 MG IV SOLR
INTRAVENOUS | Status: DC | PRN
  Administered 2017-12-21: 1000 mg

## 2017-12-21 MED ORDER — FENTANYL CITRATE (PF) 250 MCG/5ML IJ SOLN
INTRAMUSCULAR | Status: AC
  Filled 2017-12-21: qty 5

## 2017-12-21 MED ORDER — PROPOFOL 10 MG/ML IV BOLUS
INTRAVENOUS | Status: DC | PRN
  Administered 2017-12-21: 75 mg via INTRAVENOUS
  Administered 2017-12-21: 25 mg via INTRAVENOUS

## 2017-12-21 MED ORDER — CLINDAMYCIN PHOSPHATE 900 MG/50ML IV SOLN
900.0000 mg | Freq: Three times a day (TID) | INTRAVENOUS | Status: AC
  Administered 2017-12-21 – 2017-12-22 (×3): 900 mg via INTRAVENOUS
  Filled 2017-12-21 (×3): qty 50

## 2017-12-21 MED ORDER — ONDANSETRON HCL 4 MG/2ML IJ SOLN
INTRAMUSCULAR | Status: AC
  Filled 2017-12-21: qty 2

## 2017-12-21 MED ORDER — VANCOMYCIN HCL 1000 MG IV SOLR
INTRAVENOUS | Status: AC
  Filled 2017-12-21: qty 1000

## 2017-12-21 MED ORDER — FENTANYL CITRATE (PF) 100 MCG/2ML IJ SOLN: 25.0000 ug | INTRAMUSCULAR | Status: DC | PRN

## 2017-12-21 MED ORDER — LACTATED RINGERS IV SOLN
INTRAVENOUS | Status: DC
  Administered 2017-12-21: 14:00:00 via INTRAVENOUS

## 2017-12-21 MED ORDER — ROCURONIUM BROMIDE 10 MG/ML (PF) SYRINGE
PREFILLED_SYRINGE | INTRAVENOUS | Status: AC
  Filled 2017-12-21: qty 5

## 2017-12-21 MED ORDER — SUGAMMADEX SODIUM 200 MG/2ML IV SOLN
INTRAVENOUS | Status: DC | PRN
  Administered 2017-12-21: 150 mg via INTRAVENOUS

## 2017-12-21 MED ORDER — LIDOCAINE HCL (CARDIAC) 20 MG/ML IV SOLN
INTRAVENOUS | Status: DC | PRN
  Administered 2017-12-21: 80 mg via INTRAVENOUS

## 2017-12-21 MED ORDER — FENTANYL CITRATE (PF) 100 MCG/2ML IJ SOLN
INTRAMUSCULAR | Status: DC | PRN
  Administered 2017-12-21 (×6): 50 ug via INTRAVENOUS

## 2017-12-21 MED ORDER — SUGAMMADEX SODIUM 200 MG/2ML IV SOLN
INTRAVENOUS | Status: AC
  Filled 2017-12-21: qty 4

## 2017-12-21 MED ORDER — PHENYLEPHRINE HCL 10 MG/ML IJ SOLN
INTRAVENOUS | Status: DC | PRN
  Administered 2017-12-21: 25 ug/min via INTRAVENOUS

## 2017-12-21 MED ORDER — LIDOCAINE HCL (CARDIAC) 20 MG/ML IV SOLN
INTRAVENOUS | Status: AC
  Filled 2017-12-21: qty 5

## 2017-12-21 MED ORDER — ONDANSETRON HCL 4 MG/2ML IJ SOLN
INTRAMUSCULAR | Status: DC | PRN
  Administered 2017-12-21: 4 mg via INTRAVENOUS

## 2017-12-21 MED ORDER — 0.9 % SODIUM CHLORIDE (POUR BTL) OPTIME
TOPICAL | Status: DC | PRN
  Administered 2017-12-21: 1000 mL

## 2017-12-21 SURGICAL SUPPLY — 66 items
BIT DRILL 4.3 (BIT) ×2
BIT DRILL 4.3MM (BIT) ×1
BIT DRILL 4.3X300MM (BIT) IMPLANT
BIT DRILL LONG 3.3 (BIT) ×1 IMPLANT
BIT DRILL LONG 3.3MM (BIT) ×1
BIT DRILL QC 3.3X195 (BIT) ×3 IMPLANT
BLADE CLIPPER SURG (BLADE) IMPLANT
BNDG CMPR MED 10X6 ELC LF (GAUZE/BANDAGES/DRESSINGS) ×1
BNDG COHESIVE 6X5 TAN STRL LF (GAUZE/BANDAGES/DRESSINGS) ×3 IMPLANT
BNDG ELASTIC 6X10 VLCR STRL LF (GAUZE/BANDAGES/DRESSINGS) ×3 IMPLANT
BNDG GAUZE ELAST 4 BULKY (GAUZE/BANDAGES/DRESSINGS) ×2 IMPLANT
BRUSH SCRUB SURG 4.25 DISP (MISCELLANEOUS) ×6 IMPLANT
CANISTER SUCT 3000ML PPV (MISCELLANEOUS) ×3 IMPLANT
CAP LOCK NCB (Cap) ×18 IMPLANT
CHLORAPREP W/TINT 26ML (MISCELLANEOUS) ×3 IMPLANT
COVER SURGICAL LIGHT HANDLE (MISCELLANEOUS) ×3 IMPLANT
DRAPE C-ARM 42X72 X-RAY (DRAPES) ×3 IMPLANT
DRAPE C-ARMOR (DRAPES) ×3 IMPLANT
DRAPE ORTHO SPLIT 77X108 STRL (DRAPES) ×6
DRAPE SURG 17X23 STRL (DRAPES) ×3 IMPLANT
DRAPE SURG ORHT 6 SPLT 77X108 (DRAPES) ×2 IMPLANT
DRAPE U-SHAPE 47X51 STRL (DRAPES) ×3 IMPLANT
DRSG ADAPTIC 3X8 NADH LF (GAUZE/BANDAGES/DRESSINGS) ×2 IMPLANT
DRSG MEPILEX BORDER 4X12 (GAUZE/BANDAGES/DRESSINGS) IMPLANT
DRSG MEPILEX BORDER 4X4 (GAUZE/BANDAGES/DRESSINGS) IMPLANT
DRSG MEPILEX BORDER 4X8 (GAUZE/BANDAGES/DRESSINGS) ×2 IMPLANT
DRSG PAD ABDOMINAL 8X10 ST (GAUZE/BANDAGES/DRESSINGS) ×12 IMPLANT
ELECT REM PT RETURN 9FT ADLT (ELECTROSURGICAL) ×3
ELECTRODE REM PT RTRN 9FT ADLT (ELECTROSURGICAL) ×1 IMPLANT
GAUZE SPONGE 4X4 12PLY STRL (GAUZE/BANDAGES/DRESSINGS) ×3 IMPLANT
GLOVE BIO SURGEON STRL SZ7.5 (GLOVE) ×12 IMPLANT
GLOVE BIOGEL PI IND STRL 7.5 (GLOVE) ×1 IMPLANT
GLOVE BIOGEL PI INDICATOR 7.5 (GLOVE) ×2
GOWN STRL REUS W/ TWL LRG LVL3 (GOWN DISPOSABLE) ×3 IMPLANT
GOWN STRL REUS W/TWL LRG LVL3 (GOWN DISPOSABLE) ×9
K-WIRE 2.0 (WIRE) ×3
K-WIRE FXSTD 280X2XNS SS (WIRE) ×1
KIT BASIN OR (CUSTOM PROCEDURE TRAY) ×3 IMPLANT
KIT ROOM TURNOVER OR (KITS) ×3 IMPLANT
KWIRE FXSTD 280X2XNS SS (WIRE) IMPLANT
NS IRRIG 1000ML POUR BTL (IV SOLUTION) ×3 IMPLANT
PACK TOTAL JOINT (CUSTOM PROCEDURE TRAY) ×3 IMPLANT
PAD ARMBOARD 7.5X6 YLW CONV (MISCELLANEOUS) ×6 IMPLANT
PAD CAST 4YDX4 CTTN HI CHSV (CAST SUPPLIES) ×1 IMPLANT
PADDING CAST COTTON 4X4 STRL (CAST SUPPLIES) ×3
PADDING CAST COTTON 6X4 STRL (CAST SUPPLIES) ×3 IMPLANT
PLATE FEM DIST NCB PP 278MM (Plate) ×2 IMPLANT
SCREW 5.0 80MM (Screw) ×4 IMPLANT
SCREW CORTICAL NCB 5.0X40 (Screw) ×4 IMPLANT
SCREW NCB 3.5X75X5X6.2XST (Screw) IMPLANT
SCREW NCB 4.0X36MM (Screw) ×4 IMPLANT
SCREW NCB 5.0X36MM (Screw) ×3 IMPLANT
SCREW NCB 5.0X75MM (Screw) ×3 IMPLANT
SCREW NCB 5.0X85MM (Screw) ×6 IMPLANT
SPONGE LAP 18X18 X RAY DECT (DISPOSABLE) ×3 IMPLANT
STAPLER VISISTAT 35W (STAPLE) ×3 IMPLANT
SUCTION FRAZIER HANDLE 10FR (MISCELLANEOUS) ×2
SUCTION TUBE FRAZIER 10FR DISP (MISCELLANEOUS) ×1 IMPLANT
SUT ETHILON 3 0 PS 1 (SUTURE) ×6 IMPLANT
SUT VIC AB 1 CT1 27 (SUTURE) ×6
SUT VIC AB 1 CT1 27XBRD ANBCTR (SUTURE) ×2 IMPLANT
SUT VIC AB 2-0 CT1 27 (SUTURE) ×6
SUT VIC AB 2-0 CT1 TAPERPNT 27 (SUTURE) ×2 IMPLANT
TOWEL OR 17X26 10 PK STRL BLUE (TOWEL DISPOSABLE) ×6 IMPLANT
TRAY FOLEY W/METER SILVER 16FR (SET/KITS/TRAYS/PACK) IMPLANT
WATER STERILE IRR 1000ML POUR (IV SOLUTION) ×6 IMPLANT

## 2017-12-21 NOTE — Telephone Encounter (Signed)
error 

## 2017-12-21 NOTE — Progress Notes (Signed)
Patient went back to sleep.

## 2017-12-21 NOTE — Anesthesia Procedure Notes (Signed)
Procedure Name: Intubation Date/Time: 12/21/2017 2:54 PM Performed by: White, Amedeo Plenty, CRNA Pre-anesthesia Checklist: Patient identified, Emergency Drugs available, Suction available and Patient being monitored Patient Re-evaluated:Patient Re-evaluated prior to induction Oxygen Delivery Method: Circle System Utilized Preoxygenation: Pre-oxygenation with 100% oxygen Induction Type: IV induction Ventilation: Mask ventilation without difficulty Laryngoscope Size: Glidescope and 3 (elective glidescope by Paramedic Student) Grade View: Grade I Tube type: Oral Tube size: 7.0 mm Number of attempts: 1 Airway Equipment and Method: Stylet and Video-laryngoscopy Placement Confirmation: ETT inserted through vocal cords under direct vision,  positive ETCO2 and breath sounds checked- equal and bilateral Secured at: 20 cm Tube secured with: Tape Dental Injury: Teeth and Oropharynx as per pre-operative assessment  Comments: VL by Ovid Curd, Paramedic student
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
## 2017-12-21 NOTE — Op Note (Signed)
OrthopaedicSurgeryOperativeNote ([id number]) Date of Surgery: 12/21/2017  Admit Date: 12/20/2017   Diagnoses: Pre-Op Diagnoses: Left periprosthetic distal femur fracture  Post-Op Diagnosis: Same  Procedures: CPT 27511-ORIF of left distal femur fracture  Surgeons: Primary: Shona Needles, MD   Location:[facility] OR ROOM 03   AnesthesiaGeneral   Antibiotics:Clindamycin 900mg    Tourniquettime:None  [id number]:465 mL   Complications:None  Specimens:None  Implants: Implant Name Type Inv. Item Serial No. Manufacturer Lot No. LRB No. Used Action  PLATE FEM DIST NCB PP 278MM - [id number] Plate PLATE FEM DIST NCB PP 278MM  [employer](ORTH,TRAU,BIO,SG)  Left 1 Implanted  CAP LOCK NCB - [id number] Cap CAP LOCK NCB  [employer](ORTH,TRAU,BIO,SG)  Left 9 Implanted  SCREW NCB 5.0X85MM - [id number] Screw SCREW NCB 5.0X85MM  [employer](ORTH,TRAU,BIO,SG)  Left 3 Implanted  SCREW NCB 5.0X75MM - [id number] Screw SCREW NCB 5.0X75MM  [employer](ORTH,TRAU,BIO,SG)  Left 1 Implanted  SCREW CORTICAL NCB 5.0X40 - [id number] Screw SCREW CORTICAL NCB 5.0X40  [employer](ORTH,TRAU,BIO,SG)  Left 2 Implanted  SCREW NCB 5.0X36MM - [id number] Screw SCREW NCB 5.0X36MM  [employer](ORTH,TRAU,BIO,SG)  Left 1 Implanted  SCREW 5.0 80MM - [id number] Screw SCREW 5.0 80MM  [employer](ORTH,TRAU,BIO,SG)  Left 2 Implanted  SCREW NCB 4.0X36MM - [id number] Screw SCREW NCB 4.0X36MM  [employer](ORTH,TRAU,BIO,SG)  Left 2 Implanted    IndicationsforSurgery: This is a 84 year old female who sustained a ground-level fall.  She fractured her distal femur above her total knee prosthesis.  I felt that due to her age and activity level that ORIF was the most appropriate and that nonoperative treatment was not a reasonable option.  Risks and benefits were discussed with the patient and her family.  Risks discussed included bleeding requiring blood transfusion, bleeding causing a hematoma, infection,  malunion, nonunion, damage to surrounding nerves and blood vessels, pain, hardware prominence or irritation, hardware failure, stiffness, post-traumatic arthritis, DVT/PE, compartment syndrome, and even death. Risks and benefits were extensively discussed as noted above and the patient and their family agreed to proceed with surgery and consent was obtained.  Operative Findings: ORIF of a left supracondylar distal femur fracture treated with [employer] distal femur periprosthetic NCB plate 12 hole  Procedure: The patient was identified in the preoperative holding area. Consent was confirmed with the patient and their family and all questions were answered. The operative extremity was marked after confirmation with the patient. The patientwas then brought back to the operating room by our anesthesia colleagues. The patient was placed under general anesthesia and was carefully transferred over to a radiolucent flattop table. They were secured to the bed and all bony prominences were padded.  A bump was placed under the operative hip. The operative extremity was then prepped and draped in usual sterile fashion. A timeout was performed to verify the patient, the procedure and extremity. Preoperative antibiotics were dosed.  Fluoroscopy was used to identify the fracture. A lateral approach was made to the distal femur. It was taken down through the skin and the IT band was incised in line with the incision. The distal portion of the vastus lateralis was reflected off the IT band and the distal articular block of the lateral femoral condyle was exposed. Subperiosteal dissection was carried to the edges of the articular surface of the knee prosthesis. The fracture was palpated but I attempted to keep the soft tissue sleeve intact to prevent devascularization. A reduction maneuver was performed to align the fracture in the AP and lateral planes.  I used fluoroscopy to identify the  correct length plate to  bridge the whole femoral shaft and proximal to the tip of the hip prothesis. I chose a 12-hole plate. The aiming arm was attached to the plate and slide submuscularly under the vastus to the proximal femur. A K-wire was used to appropriately align the distal portion of the plate. A drill sleeve was used to provisionally fix the proximal portion of the plate with a 3.3 mm drill bit used to hold position. A perfect lateral was obtained to show appropriate positioning of the plate.  The distal segment was drilled and 5.90mm cortical screws were placed in the distal fracture segment. A total of 3 screws were placed. Bicortical 5.73mm cortical screws were placed in the femoral shaft to walk the plate down to bone. The most proximal screw was a 4.11mm screw to prevent a stress riser. These were all placed through the targeting guide with percutaneous incisions. Once all of the screws were in place in the femoral shaft, locking caps were placed over to the screws in the shaft. The targeting guide was removed. Three more screws were placed in the distal segment. Fluoroscopy was used to confirm adequate reduction of the fracture and appropriate position of the plate and screws.  The incisions were irrigated with normal saline. A gram of vancomycin powder was placed in the incision around the plate. The IT band was closed with 0-vicryl. The skin was closed with 2-vicryl, 3-0 nylon. The incisions were dressed with a sterile dressin. The patient was then transferred to the regular floor bed and taken to the PACU in stable condition.  Post Op Plan/Instructions: The patient will be weightbearing as tolerated to left lower extremity.  She will receive postoperative clindamycin.  She will be able to be started on her Eliquis either on postoperative day 1 or day 2.  I was present and performed the entire surgery.  Katha Hamming, MD [facility]

## 2017-12-21 NOTE — Transfer of Care (Signed)
Immediate Anesthesia Transfer of Care Note  Patient: Erica [name]  Procedure(s) Performed: OPEN REDUCTION INTERNAL FIXATION (ORIF) DISTAL FEMUR FRACTURE (Left )  Patient Location: PACU  Anesthesia Type:General  Level of Consciousness: awake, alert  and patient cooperative  Airway & Oxygen Therapy: Patient Spontanous Breathing  Post-op Assessment: Report given to RN and Post -op Vital signs reviewed and stable  Post vital signs: Reviewed and stable  Last Vitals:  Vitals:   12/21/17 0408 12/21/17 1100  BP: 102/64 (!) 107/53  Pulse: 100 89  Resp: 17 18  Temp: 36.8 C 36.8 C  SpO2: 94% 97%    Last Pain:  Vitals:   12/21/17 1100  TempSrc: Axillary  PainSc:          Complications: No apparent anesthesia complications

## 2017-12-21 NOTE — Progress Notes (Addendum)
PROGRESS NOTE  Alisah AFSA MEANY WNU:[id number] DOB: February 03, 1917 DOA: 12/20/2017 PCP: Virgie Dad, MD  HPI/Recap of past 24 hours:  MargaretteWetherillis a84 y.o.female,with history of chronic atrial fibrillation on Eliquis, chronic combined systolic and diastolic CHF, depression, essential hypertension, hyperlipidemia, hypothyroidism, moderate to severe pulmonary hypertension, seizures was brought to the hospital from [facility] for evaluation after a fall. Patient says that she was in the bathroom and she lost her balance and fell on her left knee. She complained of severe pain of the knee with inability to stand on it. She denies passing out.  Imaging revealed left femur fracture.  Admitted for surgery.  12/21/2017: Patient seen and examined at her bedside.    She is somnolent.  Denies any pain in her left thigh when she does not move.  Denies pain in her left lower extremity when she does not move.  She has no new complaints.  ORIF planned today by Dr. Doreatha Martin.   Assessment/Plan: Active Problems:   Hypertension   Hypothyroidism   Seizure disorder (HCC)   Femur fracture, left (HCC)  Acute left femoral fracture post fall Orthopedic surgery consulted and following Patient scheduled for ORIF today 12/21/2017 by Dr. Doreatha Martin Holding Eliquis for her chronic A. fib. Restart anticoagulation is deferred to surgery Pain management in place.  Pain is well controlled.  Chronic A. fib on Eliquis Holding Eliquis due to planned surgery Defer restarting Eliquis to orthopedic surgery Rate controlled  Ambulatory dysfunction due to left femur fracture PT per surgery recommendation Fall precaution SNF for rehab  Chronic normocytic anemia Hemoglobin 10.8 Monitor hemoglobin Repeat CBC in the morning  Thrombocytopenia Platelet stable 136 No sign of overt bleeding  Hypothyroidism Continue levothyroxine  Chronic combined diastolic and systolic heart failure with EF of  40% Continue cardiac medications Not in acute exacerbation Last echo done on 05/29/2014 revealed LVEF 40-45% with diffuse hypokinesis and grade 2 diastolic dysfunction Strict I's and O's, daily weight    Code Status: DNR  Family Communication: None at bedside  Disposition Plan: S and F after surgery   Consultants:  Orthopedic surgery  Procedures:  None  Antimicrobials:  None  DVT prophylaxis: SCDs   Objective: Vitals:   12/20/17 2057 12/21/17 0408 12/21/17 1100 12/21/17 1335  BP: (!) 122/58 102/64 (!) 107/53   Pulse: 71 100 89   Resp: 16 17 18    Temp: 99 F (37.2 C) 98.3 F (36.8 C) 98.3 F (36.8 C)   TempSrc: Oral Axillary Axillary   SpO2: 95% 94% 97%   Weight:    68 kg (150 lb)  Height:    5\' 5"  (1.651 m)    Intake/Output Summary (Last 24 hours) at 12/21/2017 1401 Last data filed at 12/21/2017 1100 Gross per 24 hour  Intake 253.54 ml  Output 950 ml  Net -696.46 ml   Filed Weights   12/20/17 0232 12/21/17 1335  Weight: 68 kg (150 lb) 68 kg (150 lb)    Exam:   General: 84 year old Caucasian female well-developed well-nourished in no acute distress.  Cardiovascular: Irregular rate and rhythm with no rubs or gallops  Respiratory: Clear to auscultation with no rales or wheezes..  Abdomen: Soft nontender nondistended normal bowel sounds x4 quadrants  Musculoskeletal: Left lower extremity is externally rotated.  Skin: No rash noted.  Psychiatry: Mood is appropriate for condition and setting.     Data Reviewed: CBC: Recent Labs  Lab 12/20/17 0301 12/21/17 1021  WBC 6.0 8.6  NEUTROABS 3.6  --  HGB 13.5 10.8*  HCT 42.9 33.4*  MCV 99.3 97.7  PLT 183 144*   Basic Metabolic Panel: Recent Labs  Lab 12/20/17 0301  NA 141  K 3.9  CL 100*  CO2 29  GLUCOSE 121*  BUN 25*  CREATININE 0.88  CALCIUM 9.0   GFR: Estimated Creatinine Clearance: 32.1 mL/min (by C-G formula based on SCr of 0.88 mg/dL). Liver Function Tests: No results for  input(s): AST, ALT, ALKPHOS, BILITOT, PROT, ALBUMIN in the last 168 hours. No results for input(s): LIPASE, AMYLASE in the last 168 hours. No results for input(s): AMMONIA in the last 168 hours. Coagulation Profile: Recent Labs  Lab 12/20/17 0301  INR 0.96   Cardiac Enzymes: No results for input(s): CKTOTAL, CKMB, CKMBINDEX, TROPONINI in the last 168 hours. BNP (last 3 results) No results for input(s): PROBNP in the last 8760 hours. HbA1C: No results for input(s): HGBA1C in the last 72 hours. CBG: No results for input(s): GLUCAP in the last 168 hours. Lipid Profile: No results for input(s): CHOL, HDL, LDLCALC, TRIG, CHOLHDL, LDLDIRECT in the last 72 hours. Thyroid Function Tests: No results for input(s): TSH, T4TOTAL, FREET4, T3FREE, THYROIDAB in the last 72 hours. Anemia Panel: No results for input(s): VITAMINB12, FOLATE, FERRITIN, TIBC, IRON, RETICCTPCT in the last 72 hours. Urine analysis:    Component Value Date/Time   COLORURINE YELLOW 08/23/2017 0335   APPEARANCEUR HAZY (A) 08/23/2017 0335   LABSPEC 1.011 08/23/2017 0335   PHURINE 5.0 08/23/2017 0335   GLUCOSEU NEGATIVE 08/23/2017 0335   HGBUR SMALL (A) 08/23/2017 0335   BILIRUBINUR NEGATIVE 08/23/2017 0335   KETONESUR NEGATIVE 08/23/2017 0335   PROTEINUR NEGATIVE 08/23/2017 0335   UROBILINOGEN 0.2 02/04/2013 2330   NITRITE POSITIVE (A) 08/23/2017 0335   LEUKOCYTESUR TRACE (A) 08/23/2017 0335   Sepsis Labs: @LABRCNTIP (procalcitonin:4,lacticidven:4)  ) Recent Results (from the past 240 hour(s))  Surgical pcr screen     Status: Abnormal   Collection Time: 12/20/17 10:13 AM  Result Value Ref Range Status   MRSA, PCR POSITIVE (A) NEGATIVE Final    Comment: CRITICAL RESULT CALLED TO, READ BACK BY AND VERIFIED WITH: B. SLOOP, RN AT 1515 ON 12/20/17 BY C. JESSUP, MLT.    Staphylococcus aureus POSITIVE (A) NEGATIVE Final    Comment: (NOTE) The Xpert SA Assay (FDA approved for NASAL specimens in patients 82 years of  age and older), is one component of a comprehensive surveillance program. It is not intended to diagnose infection nor to guide or monitor treatment.       Studies: No results found.  Scheduled Meds: . [MAR Hold] busPIRone  5 mg Oral BID  . chlorhexidine  60 mL Topical Once  . [MAR Hold] Chlorhexidine Gluconate Cloth  6 each Topical Q0600  . [MAR Hold] feeding supplement (ENSURE ENLIVE)  237 mL Oral BID BM  . [MAR Hold] levothyroxine  12.5 mcg Oral QAC breakfast  . [MAR Hold] levothyroxine  175 mcg Oral QAC breakfast  . [MAR Hold] metoprolol succinate  25 mg Oral Daily  . [MAR Hold] mupirocin ointment  1 application Nasal BID  . [MAR Hold] phenytoin  100 mg Oral BID   And  . [MAR Hold] phenytoin  25 mg Oral BID  . [MAR Hold] potassium chloride SA  40 mEq Oral Daily  . povidone-iodine  2 application Topical Once  . [MAR Hold] spironolactone  25 mg Oral BID    Continuous Infusions: . clindamycin (CLEOCIN) IV    . lactated ringers 10 mL/hr at  12/21/17 1334     LOS: 1 day     Kayleen Memos, MD [facility] Pager [phone]  If 7PM-7AM, please contact night-coverage [email or web address] Password [email or web address] 12/21/2017, 2:01 PM

## 2017-12-21 NOTE — Interval H&P Note (Signed)
History and Physical Interval Note:  12/21/2017 2:35 PM  Erica [name]  has presented today for surgery, with the diagnosis of Left distal femur fracture  The various methods of treatment have been discussed with the patient and family. After consideration of risks, benefits and other options for treatment, the patient has consented to  Procedure(s): OPEN REDUCTION INTERNAL FIXATION (ORIF) DISTAL FEMUR FRACTURE (Left) as a surgical intervention .  The patient's history has been reviewed, patient examined, no change in status, stable for surgery.  I have reviewed the patient's chart and labs.  Questions were answered to the patient's satisfaction.     Lennette Bihari P Resean Brander

## 2017-12-22 ENCOUNTER — Encounter (HOSPITAL_COMMUNITY): Payer: Self-pay | Admitting: Student

## 2017-12-22 DIAGNOSIS — T1490XA Injury, unspecified, initial encounter: Secondary | ICD-10-CM

## 2017-12-22 DIAGNOSIS — Z419 Encounter for procedure for purposes other than remedying health state, unspecified: Secondary | ICD-10-CM

## 2017-12-22 MED ORDER — APIXABAN 2.5 MG PO TABS
2.5000 mg | ORAL_TABLET | Freq: Two times a day (BID) | ORAL | Status: DC
  Administered 2017-12-22 – 2017-12-23 (×2): 2.5 mg via ORAL
  Filled 2017-12-22 (×2): qty 1

## 2017-12-22 MED FILL — Fentanyl Citrate Preservative Free (PF) Inj 100 MCG/2ML: INTRAMUSCULAR | Qty: 2 | Status: AC

## 2017-12-22 NOTE — Progress Notes (Signed)
[place] for apixaban Indication: atrial fibrillation  Labs: Recent Labs    12/20/17 0301 12/21/17 1021  HGB 13.5 10.8*  HCT 42.9 33.4*  PLT 183 136*  LABPROT 12.7  --   INR 0.96  --   CREATININE 0.88  --     Assessment: 89 yof on apixaban PTA for afib, now POD#1 ORIF for femur fracture. Pharmacy consulted to resume apixaban tonight per Ortho recommendations. Last dose was 3/11 PTA. Hg down to 10.8, plt down to 136. No bleeding issues documented. Noted DDI with phenytoin (not recommended together due to potential to decrease apixaban concentrations), but this is her home regimen.  Goal of Therapy:  Stroke prevention Monitor platelets by anticoagulation protocol: Yes   Plan:  Resume PTA apixaban 2.5mg  PO BID tonight per Ortho recommendations Monitor CBC, s/sx bleeding  Erica [name], PharmD, BCPS Clinical Pharmacist 12/22/2017 1:28 PM
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
## 2017-12-22 NOTE — Progress Notes (Signed)
PT Cancellation Note  Patient Details Name: Erica [name] MRN: [id number] DOB: 1917-05-31   Cancelled Treatment:    Reason Eval/Treat Not Completed: Patient declined, no reason specified Attempted to evaluate patient at 1055. Patient declined stating "I have been telling everyone to get out and leave me alone", after encouragement and education on benefits of mobility patient replied "I just don't care about moving". Will re-attempt later this afternoon.  Reinaldo Berber, PT, DPT Acute Rehab Services Pager: [phone]     Reinaldo Berber 12/22/2017, 10:59 AM

## 2017-12-22 NOTE — Progress Notes (Signed)
PROGRESS NOTE  Erica [name] XBJ:[id number] DOB: [birth year] DOA: 12/20/2017 PCP: Virgie Dad, MD  HPI/Recap of past 24 hours:  Erica [name] a84 y.o.female,with history of chronic atrial fibrillation on Eliquis, chronic combined systolic and diastolic CHF, depression, essential hypertension, hyperlipidemia, hypothyroidism, moderate to severe pulmonary hypertension, seizures was brought to the hospital from [facility] for evaluation after a fall. Patient says that she was in the bathroom and she lost her balance and fell on her left knee. She complained of severe pain of the knee with inability to stand on it. She denies passing out.  Imaging revealed left femur fracture.  Admitted for surgery.  12/21/2017: Patient seen and examined at her bedside.    She is somnolent.  Denies any pain in her left thigh when she does not move.  Denies pain in her left lower extremity when she does not move.  She has no new complaints.  ORIF planned today by Dr. Doreatha Martin.  12/22/2017: Patient seen and examined with a cyst on her bedside.  She is very angry and uses unpleasant language.  Refuses to answer questions and refuses to be examined.  Patient is POD #1 post ORIF left periprosthetic distal femur.   Assessment/Plan: Active Problems:   Hypertension   Hypothyroidism   Seizure disorder (HCC)   Femur fracture, left (HCC)  Acute left femoral fracture post fall, POD #1 status post ORIF of left periprosthetic distal femur Orthopedic surgery following Restart Eliquis  tonight for her chronic A. fib Continue pain management   Chronic A. fib on Eliquis Rate controlled Continue metoprolol Continue Spironolactone  Ambulatory dysfunction due to left femur fracture PT per surgery recommendation Fall precaution SNF for rehab  Chronic normocytic anemia Hemoglobin stable Repeat CBC in the morning  Thrombocytopenia Platelet stable 136 No sign of overt  bleeding  Hypothyroidism Continue levothyroxine  Chronic combined diastolic and systolic heart failure with EF of 40% Continue cardiac medications Not in acute exacerbation Last echo done on 05/29/2014 revealed LVEF 40-45% with diffuse hypokinesis and grade 2 diastolic dysfunction Strict I's and O's, daily weight    Code Status: DNR  Family Communication: None at bedside  Disposition Plan: SNF after surgery   Consultants:  Orthopedic surgery  Procedures:  None  Antimicrobials:  None  DVT prophylaxis: SCDs, restart Eliquis tonight   Objective: Vitals:   12/21/17 1754 12/21/17 2214 12/22/17 0226 12/22/17 0543  BP: 118/67 111/68 (!) 104/58 95/62  Pulse: (!) 58 91 (!) 109 (!) 102  Resp: 16 16 16 16   Temp: 97.9 F (36.6 C) 98.6 F (37 C) 97.8 F (36.6 C) 98.3 F (36.8 C)  TempSrc: Oral Axillary Axillary Axillary  SpO2: 92% 97% 99% 98%  Weight:      Height:        Intake/Output Summary (Last 24 hours) at 12/22/2017 1259 Last data filed at 12/22/2017 0900 Gross per 24 hour  Intake 1328.34 ml  Output 700 ml  Net 628.34 ml   Filed Weights   12/20/17 0232 12/21/17 1335  Weight: 68 kg (150 lb) 68 kg (150 lb)    Exam: 12/22/2017.  Patient was seen she refuses to be examined.  She crosses her arms.  Does not appear to be in any acute distress.  Speaking clearly in complete sentences..   General: 84 year old Caucasian female well-developed well-nourished in no acute distress.  Psychiatry: Mood is angry   Data Reviewed: CBC: Recent Labs  Lab 12/20/17 0301 12/21/17 1021  WBC 6.0 8.6  NEUTROABS 3.6  --  HGB 13.5 10.8*  HCT 42.9 33.4*  MCV 99.3 97.7  PLT 183 099*   Basic Metabolic Panel: Recent Labs  Lab 12/20/17 0301  NA 141  K 3.9  CL 100*  CO2 29  GLUCOSE 121*  BUN 25*  CREATININE 0.88  CALCIUM 9.0   GFR: Estimated Creatinine Clearance: 32.1 mL/min (by C-G formula based on SCr of 0.88 mg/dL). Liver Function Tests: No results for  input(s): AST, ALT, ALKPHOS, BILITOT, PROT, ALBUMIN in the last 168 hours. No results for input(s): LIPASE, AMYLASE in the last 168 hours. No results for input(s): AMMONIA in the last 168 hours. Coagulation Profile: Recent Labs  Lab 12/20/17 0301  INR 0.96   Cardiac Enzymes: No results for input(s): CKTOTAL, CKMB, CKMBINDEX, TROPONINI in the last 168 hours. BNP (last 3 results) No results for input(s): PROBNP in the last 8760 hours. HbA1C: No results for input(s): HGBA1C in the last 72 hours. CBG: No results for input(s): GLUCAP in the last 168 hours. Lipid Profile: No results for input(s): CHOL, HDL, LDLCALC, TRIG, CHOLHDL, LDLDIRECT in the last 72 hours. Thyroid Function Tests: No results for input(s): TSH, T4TOTAL, FREET4, T3FREE, THYROIDAB in the last 72 hours. Anemia Panel: No results for input(s): VITAMINB12, FOLATE, FERRITIN, TIBC, IRON, RETICCTPCT in the last 72 hours. Urine analysis:    Component Value Date/Time   COLORURINE YELLOW 08/23/2017 0335   APPEARANCEUR HAZY (A) 08/23/2017 0335   LABSPEC 1.011 08/23/2017 0335   PHURINE 5.0 08/23/2017 0335   GLUCOSEU NEGATIVE 08/23/2017 0335   HGBUR SMALL (A) 08/23/2017 0335   BILIRUBINUR NEGATIVE 08/23/2017 0335   KETONESUR NEGATIVE 08/23/2017 0335   PROTEINUR NEGATIVE 08/23/2017 0335   UROBILINOGEN 0.2 02/04/2013 2330   NITRITE POSITIVE (A) 08/23/2017 0335   LEUKOCYTESUR TRACE (A) 08/23/2017 0335   Sepsis Labs: @LABRCNTIP (procalcitonin:4,lacticidven:4)  ) Recent Results (from the past 240 hour(s))  Surgical pcr screen     Status: Abnormal   Collection Time: 12/20/17 10:13 AM  Result Value Ref Range Status   MRSA, PCR POSITIVE (A) NEGATIVE Final    Comment: CRITICAL RESULT CALLED TO, READ BACK BY AND VERIFIED WITH: B. SLOOP, RN AT 1515 ON 12/20/17 BY C. JESSUP, MLT.    Staphylococcus aureus POSITIVE (A) NEGATIVE Final    Comment: (NOTE) The Xpert SA Assay (FDA approved for NASAL specimens in patients 69 years of  age and older), is one component of a comprehensive surveillance program. It is not intended to diagnose infection nor to guide or monitor treatment.       Studies: Dg C-arm 1-60 Min  Result Date: 12/21/2017 CLINICAL DATA:  Distal femur fracture ORIF. EXAM: DG C-ARM 61-120 MIN; LEFT FEMUR 2 VIEWS COMPARISON:  Left femur x-rays from yesterday. FINDINGS: Intraoperative x-rays demonstrate interval lateral plate and screw fixation of the distal femoral periprosthetic fracture, now in near anatomic alignment. Prior left total knee arthroplasty. IMPRESSION: Interval lateral plate and screw fixation of the distal femoral periprosthetic fracture, now in near anatomic alignment. FLUOROSCOPY TIME:  1 minutes, 27 seconds. C-arm fluoroscopic images were obtained intraoperatively and submitted for post operative interpretation. Electronically Signed   By: Titus Dubin M.D.   On: 12/21/2017 16:37   Dg Femur Min 2 Views Left  Result Date: 12/21/2017 CLINICAL DATA:  Distal femur fracture ORIF. EXAM: DG C-ARM 61-120 MIN; LEFT FEMUR 2 VIEWS COMPARISON:  Left femur x-rays from yesterday. FINDINGS: Intraoperative x-rays demonstrate interval lateral plate and screw fixation of the distal femoral periprosthetic fracture, now in near anatomic alignment.  Prior left total knee arthroplasty. IMPRESSION: Interval lateral plate and screw fixation of the distal femoral periprosthetic fracture, now in near anatomic alignment. FLUOROSCOPY TIME:  1 minutes, 27 seconds. C-arm fluoroscopic images were obtained intraoperatively and submitted for post operative interpretation. Electronically Signed   By: Titus Dubin M.D.   On: 12/21/2017 16:37   Dg Femur Port Min 2 Views Left  Result Date: 12/21/2017 CLINICAL DATA:  Distal femur fracture EXAM: LEFT FEMUR PORTABLE 2 VIEWS COMPARISON:  12/20/2017 FINDINGS: Interval plate and cortical screw fixation of the distal femoral fracture. Knee prosthetic intact. IMPRESSION: Distal femur  fracture dynamic plate  fixation. Electronically Signed   By: Suzy Bouchard M.D.   On: 12/21/2017 19:46    Scheduled Meds: . busPIRone  5 mg Oral BID  . Chlorhexidine Gluconate Cloth  6 each Topical Q0600  . feeding supplement (ENSURE ENLIVE)  237 mL Oral BID BM  . levothyroxine  12.5 mcg Oral QAC breakfast  . levothyroxine  175 mcg Oral QAC breakfast  . metoprolol succinate  25 mg Oral Daily  . mupirocin ointment  1 application Nasal BID  . phenytoin  100 mg Oral BID   And  . phenytoin  25 mg Oral BID  . potassium chloride SA  40 mEq Oral Daily  . spironolactone  25 mg Oral BID    Continuous Infusions: . clindamycin (CLEOCIN) IV 900 mg (12/22/17 0609)  . lactated ringers 10 mL/hr at 12/21/17 1334     LOS: 2 days     Kayleen Memos, MD [facility] Pager [phone]  If 7PM-7AM, please contact night-coverage [email or web address] Password [facility] 12/22/2017, 12:59 PM

## 2017-12-22 NOTE — Care Management Note (Signed)
Case Management Note  Patient Details  Name: Erica [name] MRN: [id number] Date of Birth: Feb 21, 1917  Subjective/Objective:  History of history of chronic atrial fibrillation on Eliquis, CHF admitted for left femur fracture.     Action/Plan: Ortho consulted, status post ORIF left periprosthetic distal femur.  Prior to admission patient lived at long term resident at [facility] and can return for [place].  When patient  medically stable for dc and bed available SNF. Plan dc SNF, per CSW arrangements.  Expected Discharge Date:                  Expected Discharge Plan:  [place]  In-House Referral:  Clinical Social Work  Discharge planning Services  CM Consult  Status of Service:  In process, will continue to follow  Additional Comments:  Kristen Cardinal, RN  Nurse case [place] 12/22/2017, 1:23 PM

## 2017-12-22 NOTE — Evaluation (Signed)
Physical Therapy Evaluation Patient Details Name: Erica [name] MRN: [id number] DOB: [birth year] Today's Date: 12/22/2017   History of Present Illness  Erica [name]  is a 84 y.o. female, with history of chronic atrial fibrillation on Eliquis, chronic combined systolic and diastolic CHF, depression, essential hypertension, hyperlipidemia, hypothyroidism, moderate to severe pulmonary hypertension, seizures was brought to the hospital from [facility] for evaluation after a fall.  Patient says that she was in the bathroom and she lost her balance and fell on her left knee.  She complained of severe pain of the knee with inability to stand on it. She denies passing out.  Imaging revealed left femur fracture.  Admitted for surgery now s/p ORIF L distal femur 3/13.    Clinical Impression  Patient is s/p above surgery resulting in functional limitations due to the deficits listed below (see PT Problem List). PTA, pt living at [facility].Patient is unreliable historian as she is AO to person only and admits she can't recall. Reports she was walking wherever she wanted but forgets if she uses a walker or cane. Upon eval pt presents with high levels of post op pain and generalized weaknesses that limit her mobility. Currently Max A x2 to safely transfer into bedside chair and unable to ambulate at this time.  Patient will benefit from skilled PT to increase their independence and safety with mobility to allow discharge to the venue listed below.       Follow Up Recommendations SNF;Supervision/Assistance - 24 hour    Equipment Recommendations  Rolling walker with 5" wheels    Recommendations for Other Services OT consult     Precautions / Restrictions Precautions Precautions: Fall Restrictions Weight Bearing Restrictions: Yes LLE Weight Bearing: Non weight bearing      Mobility  Bed Mobility Overal bed mobility: Needs Assistance Bed Mobility: Supine to Sit     Supine to sit: Max  assist     General bed mobility comments: Max A for surgial limb and to help power trunk up.  Transfers Overall transfer level: Needs assistance Equipment used: Rolling walker (2 wheeled) Transfers: Sit to/from [facility] Sit to Stand: Max assist;+2 physical assistance Stand pivot transfers: Max assist;+2 physical assistance       General transfer comment: Max A x2 to transfer into chair.   Ambulation/Gait             General Gait Details: unable  Stairs            Wheelchair Mobility    Modified Rankin (Stroke Patients Only)       Balance Overall balance assessment: History of Falls;Needs assistance   Sitting balance-Leahy Scale: Fair       Standing balance-Leahy Scale: Zero                               Pertinent Vitals/Pain Pain Assessment: Faces Faces Pain Scale: Hurts whole lot Pain Location: L leg  Pain Descriptors / Indicators: Contraction;Discomfort;Grimacing Pain Intervention(s): Limited activity within patient's tolerance;Monitored during session;Repositioned;Premedicated before session    Home Living Family/patient expects to be discharged to:: Skilled nursing facility                      Prior Function Level of Independence: Needs assistance         Comments: Unreliable historian      Hand Dominance        Extremity/Trunk Assessment  Upper Extremity Assessment Upper Extremity Assessment: Generalized weakness    Lower Extremity Assessment Lower Extremity Assessment: Generalized weakness    Cervical / Trunk Assessment Cervical / Trunk Assessment: Kyphotic  Communication      Cognition Arousal/Alertness: Awake/alert Behavior During Therapy: WFL for tasks assessed/performed Overall Cognitive Status: No family/caregiver present to determine baseline cognitive functioning                                 General Comments: Patient is AO to person only. ("I'm at a point  where I am coming or going")      General Comments      Exercises General Exercises - Lower Extremity Ankle Circles/Pumps: 20 reps   Assessment/Plan    PT Assessment Patient needs continued PT services  PT Problem List Decreased strength;Decreased range of motion;Decreased activity tolerance;Decreased balance;Decreased mobility;Decreased coordination;Decreased cognition;Decreased knowledge of use of DME;Decreased safety awareness;Pain;Decreased skin integrity       PT Treatment Interventions DME instruction;Gait training;Stair training;Functional mobility training;Therapeutic activities;Therapeutic exercise;Balance training    PT Goals (Current goals can be found in the Care Plan section)  Acute Rehab PT Goals Patient Stated Goal: non stated PT Goal Formulation: With patient Time For Goal Achievement: 12/29/17 Potential to Achieve Goals: Fair    Frequency Min 3X/week   Barriers to discharge        Co-evaluation               AM-PAC PT "6 Clicks" Daily Activity  Outcome Measure Difficulty turning over in bed (including adjusting bedclothes, sheets and blankets)?: Unable Difficulty moving from lying on back to sitting on the side of the bed? : Unable Difficulty sitting down on and standing up from a chair with arms (e.g., wheelchair, bedside commode, etc,.)?: Unable Help needed moving to and from a bed to chair (including a wheelchair)?: Total Help needed walking in hospital room?: Total Help needed climbing 3-5 steps with a railing? : Total 6 Click Score: 6    End of Session Equipment Utilized During Treatment: Gait belt Activity Tolerance: Patient limited by pain;Patient limited by lethargy Patient left: in chair;with call bell/phone within reach;with chair alarm set Nurse Communication: Mobility status PT Visit Diagnosis: Unsteadiness on feet (R26.81);Other abnormalities of gait and mobility (R26.89);Muscle weakness (generalized) (M62.81);History of falling  (Z91.81);Pain Pain - Right/Left: Left Pain - part of body: Leg    Time: 1745-1810 PT Time Calculation (min) (ACUTE ONLY): 25 min   Charges:   PT Evaluation $PT Eval Moderate Complexity: 1 Mod PT Treatments $Therapeutic Activity: 8-22 mins   PT G Codes:        Reinaldo Berber, PT, DPT Acute Rehab Services Pager: [phone]    Reinaldo Berber 12/22/2017, 6:11 PM

## 2017-12-23 ENCOUNTER — Inpatient Hospital Stay
Admission: RE | Admit: 2017-12-23 | Discharge: 2019-06-30 | Disposition: A | Discharge status: Still In Hospital | Payer: Medicare Other | Source: Ambulatory Visit | Attending: Internal Medicine | Admitting: Internal Medicine

## 2017-12-23 ENCOUNTER — Encounter (HOSPITAL_COMMUNITY): Payer: Self-pay | Admitting: Student

## 2017-12-23 DIAGNOSIS — T1490XA Injury, unspecified, initial encounter: Secondary | ICD-10-CM | POA: Diagnosis not present

## 2017-12-23 DIAGNOSIS — R569 Unspecified convulsions: Secondary | ICD-10-CM | POA: Diagnosis not present

## 2017-12-23 DIAGNOSIS — R262 Difficulty in walking, not elsewhere classified: Secondary | ICD-10-CM | POA: Diagnosis not present

## 2017-12-23 DIAGNOSIS — I4891 Unspecified atrial fibrillation: Secondary | ICD-10-CM | POA: Diagnosis not present

## 2017-12-23 DIAGNOSIS — I5042 Chronic combined systolic (congestive) and diastolic (congestive) heart failure: Secondary | ICD-10-CM | POA: Diagnosis not present

## 2017-12-23 DIAGNOSIS — D509 Iron deficiency anemia, unspecified: Secondary | ICD-10-CM | POA: Diagnosis not present

## 2017-12-23 DIAGNOSIS — B351 Tinea unguium: Secondary | ICD-10-CM | POA: Diagnosis not present

## 2017-12-23 DIAGNOSIS — M9712XD Periprosthetic fracture around internal prosthetic left knee joint, subsequent encounter: Secondary | ICD-10-CM | POA: Diagnosis not present

## 2017-12-23 DIAGNOSIS — I10 Essential (primary) hypertension: Secondary | ICD-10-CM | POA: Diagnosis not present

## 2017-12-23 DIAGNOSIS — E039 Hypothyroidism, unspecified: Secondary | ICD-10-CM | POA: Diagnosis not present

## 2017-12-23 DIAGNOSIS — Z961 Presence of intraocular lens: Secondary | ICD-10-CM | POA: Diagnosis not present

## 2017-12-23 DIAGNOSIS — H109 Unspecified conjunctivitis: Secondary | ICD-10-CM | POA: Diagnosis not present

## 2017-12-23 DIAGNOSIS — R279 Unspecified lack of coordination: Secondary | ICD-10-CM | POA: Diagnosis not present

## 2017-12-23 DIAGNOSIS — S7292XA Unspecified fracture of left femur, initial encounter for closed fracture: Secondary | ICD-10-CM | POA: Diagnosis not present

## 2017-12-23 DIAGNOSIS — S72402A Unspecified fracture of lower end of left femur, initial encounter for closed fracture: Secondary | ICD-10-CM | POA: Diagnosis not present

## 2017-12-23 DIAGNOSIS — Z7901 Long term (current) use of anticoagulants: Secondary | ICD-10-CM | POA: Diagnosis not present

## 2017-12-23 DIAGNOSIS — F329 Major depressive disorder, single episode, unspecified: Secondary | ICD-10-CM | POA: Diagnosis not present

## 2017-12-23 DIAGNOSIS — E785 Hyperlipidemia, unspecified: Secondary | ICD-10-CM | POA: Diagnosis not present

## 2017-12-23 DIAGNOSIS — E038 Other specified hypothyroidism: Secondary | ICD-10-CM | POA: Diagnosis not present

## 2017-12-23 DIAGNOSIS — Z419 Encounter for procedure for purposes other than remedying health state, unspecified: Secondary | ICD-10-CM | POA: Diagnosis not present

## 2017-12-23 DIAGNOSIS — F419 Anxiety disorder, unspecified: Secondary | ICD-10-CM | POA: Diagnosis not present

## 2017-12-23 DIAGNOSIS — M6281 Muscle weakness (generalized): Secondary | ICD-10-CM | POA: Diagnosis not present

## 2017-12-23 DIAGNOSIS — F4322 Adjustment disorder with anxiety: Secondary | ICD-10-CM | POA: Diagnosis not present

## 2017-12-23 DIAGNOSIS — S7292XS Unspecified fracture of left femur, sequela: Secondary | ICD-10-CM | POA: Diagnosis not present

## 2017-12-23 DIAGNOSIS — I739 Peripheral vascular disease, unspecified: Secondary | ICD-10-CM | POA: Diagnosis not present

## 2017-12-23 DIAGNOSIS — Z9181 History of falling: Secondary | ICD-10-CM | POA: Diagnosis not present

## 2017-12-23 DIAGNOSIS — Z4789 Encounter for other orthopedic aftercare: Secondary | ICD-10-CM | POA: Diagnosis not present

## 2017-12-23 DIAGNOSIS — M9702XD Periprosthetic fracture around internal prosthetic left hip joint, subsequent encounter: Secondary | ICD-10-CM | POA: Diagnosis not present

## 2017-12-23 DIAGNOSIS — F0391 Unspecified dementia with behavioral disturbance: Secondary | ICD-10-CM | POA: Diagnosis not present

## 2017-12-23 DIAGNOSIS — G40909 Epilepsy, unspecified, not intractable, without status epilepticus: Secondary | ICD-10-CM | POA: Diagnosis not present

## 2017-12-23 DIAGNOSIS — D4981 Neoplasm of unspecified behavior of retina and choroid: Secondary | ICD-10-CM | POA: Diagnosis not present

## 2017-12-23 DIAGNOSIS — R296 Repeated falls: Secondary | ICD-10-CM | POA: Diagnosis not present

## 2017-12-23 LAB — BASIC METABOLIC PANEL
ANION GAP: 9 (ref 5–15)
BUN: 25 mg/dL — ABNORMAL HIGH (ref 6–20)
CALCIUM: 8.1 mg/dL — AB (ref 8.9–10.3)
CHLORIDE: 97 mmol/L — AB (ref 101–111)
CO2: 28 mmol/L (ref 22–32)
Creatinine, Ser: 0.81 mg/dL (ref 0.44–1.00)
GFR calc Af Amer: >60 mL/min (ref >60)
GFR calc non Af Amer: 59 mL/min — ABNORMAL LOW (ref >60)
GLUCOSE: 133 mg/dL — AB (ref 65–99)
Potassium: 4.3 mmol/L (ref 3.5–5.1)
Sodium: 134 mmol/L — ABNORMAL LOW (ref 135–145)

## 2017-12-23 LAB — CBC
HCT: 25.9 % — ABNORMAL LOW (ref 36.0–46.0)
Hemoglobin: 8.6 g/dL — ABNORMAL LOW (ref 12.0–15.0)
MCH: 32.5 pg (ref 26.0–34.0)
MCHC: 33.2 g/dL (ref 30.0–36.0)
MCV: 97.7 fL (ref 78.0–100.0)
PLATELETS: 129 10*3/uL — AB (ref 150–400)
RBC: 2.65 MIL/uL — AB (ref 3.87–5.11)
RDW: 14.1 % (ref 11.5–15.5)
WBC: 7.6 10*3/uL (ref 4.0–10.5)

## 2017-12-23 MED ORDER — DICLOFENAC SODIUM 1 % TD GEL: 2.0000 g | Freq: Three times a day (TID) | TRANSDERMAL | 0 refills | Status: DC

## 2017-12-23 MED ORDER — DICLOFENAC SODIUM 1 % TD GEL
2.0000 g | Freq: Three times a day (TID) | TRANSDERMAL | Status: DC
  Filled 2017-12-23 (×2): qty 100

## 2017-12-23 MED ORDER — METOPROLOL SUCCINATE ER 25 MG PO TB24: 12.5000 mg | ORAL_TABLET | Freq: Every day | ORAL | 0 refills | Status: DC

## 2017-12-23 MED ORDER — PHENYTOIN 50 MG PO CHEW: 25.0000 mg | CHEWABLE_TABLET | Freq: Two times a day (BID) | ORAL | 0 refills | Status: DC

## 2017-12-23 MED ORDER — METOPROLOL SUCCINATE ER 25 MG PO TB24: 12.5000 mg | ORAL_TABLET | Freq: Every day | ORAL | Status: DC

## 2017-12-23 MED ORDER — ENSURE ENLIVE PO LIQD: 237.0000 mL | Freq: Two times a day (BID) | ORAL | 0 refills | Status: DC

## 2017-12-23 MED ORDER — PHENYTOIN SODIUM EXTENDED 100 MG PO CAPS: 100.0000 mg | ORAL_CAPSULE | Freq: Two times a day (BID) | ORAL | 0 refills | Status: DC

## 2017-12-23 NOTE — Progress Notes (Signed)
Discharge to [facility] transported by [facility]. Patient alert X1 , not in any distress prior to transport. VSS.

## 2017-12-23 NOTE — Progress Notes (Signed)
Report given to Carlota Raspberry, Therapist, sports at [facility].

## 2017-12-23 NOTE — Discharge Summary (Signed)
Discharge Summary  Makai JKAYLA SPIEWAK YPP:[id number] DOB: [birth year]  PCP: Virgie Dad, MD  Admit date: 12/20/2017 Discharge date: 12/23/2017  Time spent: 25 minutes  Recommendations for Outpatient Follow-up:  1. Follow-up with PCP 2. Fall precaution 3. Take your medications as prescribed 4. Continue physical therapy at the skilled nursing facility 5. Follow-up with orthopedic surgery  Discharge Diagnoses:  Active Hospital Problems   Diagnosis Date Noted  . Femur fracture, left ([place]) 12/20/2017  . Hypertension 03/30/2012  . Hypothyroidism 03/30/2012  . Seizure disorder ([place]) 03/30/2012    Resolved Hospital Problems  No resolved problems to display.    Discharge Condition: Stable  Diet recommendation: Resume previous diet  Vitals:   12/22/17 2015 12/23/17 0527  BP: (!) 102/58 (!) 96/43  Pulse: 95 89  Resp: 16 17  Temp: 98.3 F (36.8 C) 97.9 F (36.6 C)  SpO2: 95% 96%    History of present illness:  MargaretteWetherillis a84 y.o.female,with history ofchronicatrial fibrillationon Eliquis, chronic combined systolic and diastolic CHF, depression, essential hypertension, hyperlipidemia, hypothyroidism, moderate to severe pulmonary hypertension, seizures was brought to the hospital from [facility] for evaluation after a fall. Patient says that she was in the bathroom and she lost her balance and fell on her left knee. She complained of severe pain of the knee with inability to stand on it. She denies passing out.  Imaging revealed left femur fracture.  Admitted for surgery.  12/21/2017: Patient seen and examined at her bedside.   She is somnolent.  Denies any pain in her left thigh when she does not move.  Denies pain in her left lower extremitywhen she does not move.  She has no new complaints.  ORIF planned today by Dr. Doreatha Martin.  12/22/2017: Patient seen and examined at her bedside.  She is very angry and uses unpleasant language.  Refuses to answer  questions and refuses to be examined.  Patient is POD #1 post ORIF left periprosthetic distal femur.  12/23/2017: Patient seen and examined at her bedside.  She denies any pain.  Her left lower extremity is wrapped in surgical wrap.  She is alert and coherent.  Refuses to answer questions.  On the day of discharge patient was hemodynamically stable.  She will need to continue physical therapy in the skilled nursing facility.  She will also need to follow-up with her primary care provider post hospitalization.  Hospital Course:  Active Problems:   Hypertension   Hypothyroidism   Seizure disorder ([place])   Femur fracture, left ([facility])  Acute left femoral fracture post fall, POD #2 status post ORIF of left periprosthetic distal femur Orthopedic surgery following Restarted Eliquis on 12/22/2017 Denies pain currently Continue with Tylenol and alternate with Voltaren gel as tolerated  Chronic A. fib on Eliquis Rate controlled Continue metoprolol Continue Spironolactone Continue Eliquis for CVA prophylaxis  Ambulatory dysfunction due to left femur fracture PT per surgery recommendation Fall precaution SNF for rehab  Chronic normocytic anemia Hemoglobin stable  Thrombocytopenia Platelet stable 136 No sign of overt bleeding  Hypothyroidism Continue levothyroxine  Chronic combined diastolic and systolic heart failure with EF of 40% Continue cardiac medications Not in acute exacerbation Last echo done on 05/29/2014 revealed LVEF 40-45% with diffuse hypokinesis and grade 2 diastolic dysfunction Strict I's and O's, daily weight   Procedures: POD #1 status post ORIF of left periprosthetic distal femur  Consultations:  Orthopedic surgery  Discharge Exam: BP (!) 96/43 (BP Location: Right Arm)   Pulse 89   Temp 97.9  F (36.6 C) (Oral)   Resp 17   Ht 5\' 5"  (1.651 m)   Wt 68 kg (150 lb)   SpO2 96%   BMI 24.96 kg/m   General: 84 year old Caucasian female well-developed  well-nourished man in bed in no apparent acute distress. Cardiovascular: Irregular rate and rhythm with no rubs or gallops. Respiratory: Clear to auscultation with no rales or wheezes.  Discharge Instructions You were cared for by a hospitalist during your hospital stay. If you have any questions about your discharge medications or the care you received while you were in the hospital after you are discharged, you can call the unit and asked to speak with the hospitalist on call if the hospitalist that took care of you is not available. Once you are discharged, your primary care physician will handle any further medical issues. Please note that NO REFILLS for any discharge medications will be authorized once you are discharged, as it is imperative that you return to your primary care physician (or establish a relationship with a primary care physician if you do not have one) for your aftercare needs so that they can reassess your need for medications and monitor your lab values.   Allergies as of 12/23/2017      Reactions   Clarithromycin Other (See Comments)   "made me ill"-reaction years ago   Ditropan [oxybutynin Chloride] Itching, Other (See Comments)   Constipation   Prednisone Other (See Comments)   Stomach pain & dizziness   Vioxx [rofecoxib] Other (See Comments)   dizziness   Biaxin [clarithromycin] Other (See Comments)   unspecified   Erythromycin Other (See Comments)   unspecified   Keflex [cephalexin] Other (See Comments)   Macrodantin [nitrofurantoin Macrocrystal] Other (See Comments)   unspecified      Medication List    STOP taking these medications   oseltamivir 30 MG capsule Commonly known as:  TAMIFLU     TAKE these medications   acetaminophen 325 MG tablet Commonly known as:  TYLENOL Take 650 mg by mouth every 4 (four) hours as needed for mild pain.   B-complex with vitamin C tablet Take 1 tablet by mouth daily.   busPIRone 5 MG tablet Commonly known as:   BUSPAR Take 5 mg by mouth 2 (two) times daily.   calcium-vitamin D 500-200 MG-UNIT tablet Commonly known as:  OSCAL WITH D Take 1 tablet by mouth 2 (two) times daily.   diclofenac sodium 1 % Gel Commonly known as:  VOLTAREN Apply 2 g topically 3 (three) times daily.   ELIQUIS 2.5 MG Tabs tablet Generic drug:  apixaban Take 2.5 mg by mouth 2 (two) times daily.   feeding supplement (ENSURE ENLIVE) Liqd Take 237 mLs by mouth 2 (two) times daily between meals.   levothyroxine 175 MCG tablet Commonly known as:  SYNTHROID, LEVOTHROID Take 175 mcg by mouth daily before breakfast. 164mcg with 12.20mcg = 187.5mg    levothyroxine 25 MCG tablet Commonly known as:  SYNTHROID, LEVOTHROID Take 12.5 mcg by mouth daily before breakfast. 12.88mcg with 121mcg=187.38mcg   metoprolol succinate 25 MG 24 hr tablet Commonly known as:  TOPROL-XL Take 0.5 tablets (12.5 mg total) by mouth daily. Start taking on:  12/24/2017 What changed:  how much to take   multivitamin with minerals Tabs tablet Take 1 tablet by mouth daily.   phenytoin 100 MG ER capsule Commonly known as:  DILANTIN Take 1 capsule (100 mg total) by mouth 2 (two) times daily. What changed:    when to  take this  additional instructions   phenytoin 50 MG tablet Commonly known as:  DILANTIN Chew 0.5 tablets (25 mg total) by mouth 2 (two) times daily. What changed:  additional instructions   potassium chloride 10 MEQ CR capsule Commonly known as:  MICRO-K Take 20-40 mEq by mouth See admin instructions. 70meq by mouth every morning, 4meq by mouth every evening - do not crush   spironolactone 25 MG tablet Commonly known as:  ALDACTONE Take 25 mg by mouth 2 (two) times daily.   torsemide 20 MG tablet Commonly known as:  DEMADEX Take 20 mg by mouth daily.   Vitamin D3 2000 units Tabs Take 1 tablet by mouth daily.   ZINC OXIDE EX Apply 1 application topically every 12 (twelve) hours as needed (to sacrum/buttock).       Allergies  Allergen Reactions  . Clarithromycin Other (See Comments)    "made me ill"-reaction years ago  . Ditropan [Oxybutynin Chloride] Itching and Other (See Comments)    Constipation  . Prednisone Other (See Comments)    Stomach pain & dizziness  . Vioxx [Rofecoxib] Other (See Comments)    dizziness  . Biaxin [Clarithromycin] Other (See Comments)    unspecified  . Erythromycin Other (See Comments)    unspecified  . Keflex [Cephalexin] Other (See Comments)  . Macrodantin [Nitrofurantoin Macrocrystal] Other (See Comments)    unspecified   Follow-up Information    Virgie Dad, MD. Call.   Specialty:  Internal Medicine Contact information: [place] [phone]            The results of significant diagnostics from this hospitalization (including imaging, microbiology, ancillary and laboratory) are listed below for reference.    Significant Diagnostic Studies: Dg Chest 1 View  Result Date: 12/20/2017 CLINICAL DATA:  Recent falls, with concern for chest injury. Initial encounter. EXAM: CHEST 1 VIEW COMPARISON:  Chest radiograph performed 08/22/2017 FINDINGS: The lungs are well-aerated. Mild vascular congestion is noted. Mild chronic right-sided pleural thickening is noted. There is no evidence of focal opacification, pleural effusion or pneumothorax. The cardiomediastinal silhouette is borderline normal in size. No acute osseous abnormalities are seen. Degenerative change is noted at the glenohumeral joints bilaterally. IMPRESSION: Mild vascular congestion. Lungs remain grossly clear. No displaced rib fracture seen. Electronically Signed   By: Garald Balding M.D.   On: 12/20/2017 04:20   Dg Tibia/fibula Left  Result Date: 12/20/2017 CLINICAL DATA:  84 y/o F; 2 recent fall. Pain of the entire left leg. EXAM: DG HIP (WITH OR WITHOUT PELVIS) 2-3V LEFT; LEFT FEMUR 2 VIEWS; LEFT TIBIA AND FIBULA - 2 VIEW; LEFT KNEE - COMPLETE 4+ VIEW  COMPARISON:  08/22/2017 pelvis radiographs. FINDINGS: Pelvis and left hip: Chronic fracture deformities of left superior and inferior pubic ramus. Partially visualized right total hip prosthesis in normal alignment. No acute pelvic fracture or diastasis identified. Severe osteoarthrosis of the left hip joint with flattening deformity of the femoral head, loss of superior joint space, remodeling of the superior acetabulum, and severe sclerosis. Left femur: Acute oblique comminuted fracture of the distal femur extending to the femoral condyle prosthesis with medial and anterior 1/2 shaft's with displacement of the shaft component. Fracture components at the level of prosthesis are obscured by the hardware. The tibial plateau and femoral head prosthesis components appear well aligned. No proximal femur fracture identified. Left knee: Acute oblique comminuted fracture of the distal femur extending to the femoral condyle prosthesis with medial and anterior 1/2  shaft's with displacement of the shaft component. Fracture components at the level of prosthesis are obscured by the hardware. The tibial plateau and femoral head prosthesis components appear well aligned. Left tibia and fibula: There is no evidence of hip fracture or dislocation. There is no evidence of arthropathy or other focal bone abnormality. IMPRESSION: Acute oblique comminuted fracture of the distal left femur extending to the femoral condyle prosthesis with 1/2 shaft's with medial and anterior displacement of the femoral shaft components. Fracture components at level of prosthesis are obscured by the hardware. Tibial plateau and femoral head prosthesis components are well aligned. Stable severe osteoarthritis of left hip joint with deformity of femoral head. Stable chronic fracture deformities of left superior and inferior pubic rami. No acute pelvic fracture identified. Electronically Signed   By: Kristine Garbe M.D.   On: 12/20/2017 04:15    Ct Head Wo Contrast  Result Date: 12/20/2017 CLINICAL DATA:  Status post 2 recent falls, with concern for head or cervical spine injury. EXAM: CT HEAD WITHOUT CONTRAST CT CERVICAL SPINE WITHOUT CONTRAST TECHNIQUE: Multidetector CT imaging of the head and cervical spine was performed following the standard protocol without intravenous contrast. Multiplanar CT image reconstructions of the cervical spine were also generated. COMPARISON:  CT of the head, maxillofacial structures and cervical spine performed 08/22/2017 FINDINGS: CT HEAD FINDINGS Brain: No evidence of acute infarction, hemorrhage, hydrocephalus or extra-axial collection. The patient's large 4 cm right inferior frontal lobe meningioma is relatively stable in appearance, with diffuse surrounding vasogenic edema and associated mass effect and 5 mm of midline shift. Prominence of the ventricles and sulci reflects moderate cortical volume loss. Cerebellar atrophy is noted. The brainstem and fourth ventricle are within normal limits. Vascular: No hyperdense vessel or unexpected calcification. Skull: There is no evidence of fracture; visualized osseous structures are unremarkable in appearance. Sinuses/Orbits: The visualized portions of the orbits are within normal limits. The paranasal sinuses and mastoid air cells are well-aerated. Other: No significant soft tissue abnormalities are seen. CT CERVICAL SPINE FINDINGS Alignment: There is mild grade 1 anterolisthesis of C3 on C4. There is underlying fusion of the posterior elements along the upper cervical spine. Skull base and vertebrae: No acute fracture. No primary bone lesion or focal pathologic process. Soft tissues and spinal canal: No prevertebral fluid or swelling. No visible canal hematoma. Disc levels: Mild multilevel disc space narrowing is noted along the cervical spine, with scattered anterior and posterior disc osteophyte complexes. Degenerative change is noted about the dens, with some degree  of underlying deformity raising question for prior healed fracture. Upper chest: The minimally visualized lung apices are clear. The thyroid gland is diminutive and grossly unremarkable in appearance. Other: No additional soft tissue abnormalities are seen. IMPRESSION: 1. No evidence of traumatic intracranial injury or fracture. 2. No evidence of fracture or subluxation along the cervical spine. 3. Mild diffuse degenerative change along the cervical spine. 4. Moderate cortical volume loss. 5. 4 cm large right inferior frontal lobe meningioma is relatively stable in appearance, with surrounding vasogenic edema. Associated mass effect and midline shift are relatively stable. Electronically Signed   By: Garald Balding M.D.   On: 12/20/2017 04:32   Ct Cervical Spine Wo Contrast  Result Date: 12/20/2017 CLINICAL DATA:  Status post 2 recent falls, with concern for head or cervical spine injury. EXAM: CT HEAD WITHOUT CONTRAST CT CERVICAL SPINE WITHOUT CONTRAST TECHNIQUE: Multidetector CT imaging of the head and cervical spine was performed following the standard protocol without intravenous  contrast. Multiplanar CT image reconstructions of the cervical spine were also generated. COMPARISON:  CT of the head, maxillofacial structures and cervical spine performed 08/22/2017 FINDINGS: CT HEAD FINDINGS Brain: No evidence of acute infarction, hemorrhage, hydrocephalus or extra-axial collection. The patient's large 4 cm right inferior frontal lobe meningioma is relatively stable in appearance, with diffuse surrounding vasogenic edema and associated mass effect and 5 mm of midline shift. Prominence of the ventricles and sulci reflects moderate cortical volume loss. Cerebellar atrophy is noted. The brainstem and fourth ventricle are within normal limits. Vascular: No hyperdense vessel or unexpected calcification. Skull: There is no evidence of fracture; visualized osseous structures are unremarkable in appearance.  Sinuses/Orbits: The visualized portions of the orbits are within normal limits. The paranasal sinuses and mastoid air cells are well-aerated. Other: No significant soft tissue abnormalities are seen. CT CERVICAL SPINE FINDINGS Alignment: There is mild grade 1 anterolisthesis of C3 on C4. There is underlying fusion of the posterior elements along the upper cervical spine. Skull base and vertebrae: No acute fracture. No primary bone lesion or focal pathologic process. Soft tissues and spinal canal: No prevertebral fluid or swelling. No visible canal hematoma. Disc levels: Mild multilevel disc space narrowing is noted along the cervical spine, with scattered anterior and posterior disc osteophyte complexes. Degenerative change is noted about the dens, with some degree of underlying deformity raising question for prior healed fracture. Upper chest: The minimally visualized lung apices are clear. The thyroid gland is diminutive and grossly unremarkable in appearance. Other: No additional soft tissue abnormalities are seen. IMPRESSION: 1. No evidence of traumatic intracranial injury or fracture. 2. No evidence of fracture or subluxation along the cervical spine. 3. Mild diffuse degenerative change along the cervical spine. 4. Moderate cortical volume loss. 5. 4 cm large right inferior frontal lobe meningioma is relatively stable in appearance, with surrounding vasogenic edema. Associated mass effect and midline shift are relatively stable. Electronically Signed   By: Garald Balding M.D.   On: 12/20/2017 04:32   Dg Knee Complete 4 Views Left  Result Date: 12/20/2017 CLINICAL DATA:  84 y/o F; 2 recent fall. Pain of the entire left leg. EXAM: DG HIP (WITH OR WITHOUT PELVIS) 2-3V LEFT; LEFT FEMUR 2 VIEWS; LEFT TIBIA AND FIBULA - 2 VIEW; LEFT KNEE - COMPLETE 4+ VIEW COMPARISON:  08/22/2017 pelvis radiographs. FINDINGS: Pelvis and left hip: Chronic fracture deformities of left superior and inferior pubic ramus. Partially  visualized right total hip prosthesis in normal alignment. No acute pelvic fracture or diastasis identified. Severe osteoarthrosis of the left hip joint with flattening deformity of the femoral head, loss of superior joint space, remodeling of the superior acetabulum, and severe sclerosis. Left femur: Acute oblique comminuted fracture of the distal femur extending to the femoral condyle prosthesis with medial and anterior 1/2 shaft's with displacement of the shaft component. Fracture components at the level of prosthesis are obscured by the hardware. The tibial plateau and femoral head prosthesis components appear well aligned. No proximal femur fracture identified. Left knee: Acute oblique comminuted fracture of the distal femur extending to the femoral condyle prosthesis with medial and anterior 1/2 shaft's with displacement of the shaft component. Fracture components at the level of prosthesis are obscured by the hardware. The tibial plateau and femoral head prosthesis components appear well aligned. Left tibia and fibula: There is no evidence of hip fracture or dislocation. There is no evidence of arthropathy or other focal bone abnormality. IMPRESSION: Acute oblique comminuted fracture of the distal  left femur extending to the femoral condyle prosthesis with 1/2 shaft's with medial and anterior displacement of the femoral shaft components. Fracture components at level of prosthesis are obscured by the hardware. Tibial plateau and femoral head prosthesis components are well aligned. Stable severe osteoarthritis of left hip joint with deformity of femoral head. Stable chronic fracture deformities of left superior and inferior pubic rami. No acute pelvic fracture identified. Electronically Signed   By: Kristine Garbe M.D.   On: 12/20/2017 04:15   Dg C-arm 1-60 Min  Result Date: 12/21/2017 CLINICAL DATA:  Distal femur fracture ORIF. EXAM: DG C-ARM 61-120 MIN; LEFT FEMUR 2 VIEWS COMPARISON:  Left femur  x-rays from yesterday. FINDINGS: Intraoperative x-rays demonstrate interval lateral plate and screw fixation of the distal femoral periprosthetic fracture, now in near anatomic alignment. Prior left total knee arthroplasty. IMPRESSION: Interval lateral plate and screw fixation of the distal femoral periprosthetic fracture, now in near anatomic alignment. FLUOROSCOPY TIME:  1 minutes, 27 seconds. C-arm fluoroscopic images were obtained intraoperatively and submitted for post operative interpretation. Electronically Signed   By: Titus Dubin M.D.   On: 12/21/2017 16:37   Dg Hip Unilat W Or Wo Pelvis 2-3 Views Left  Result Date: 12/20/2017 CLINICAL DATA:  84 y/o F; 2 recent fall. Pain of the entire left leg. EXAM: DG HIP (WITH OR WITHOUT PELVIS) 2-3V LEFT; LEFT FEMUR 2 VIEWS; LEFT TIBIA AND FIBULA - 2 VIEW; LEFT KNEE - COMPLETE 4+ VIEW COMPARISON:  08/22/2017 pelvis radiographs. FINDINGS: Pelvis and left hip: Chronic fracture deformities of left superior and inferior pubic ramus. Partially visualized right total hip prosthesis in normal alignment. No acute pelvic fracture or diastasis identified. Severe osteoarthrosis of the left hip joint with flattening deformity of the femoral head, loss of superior joint space, remodeling of the superior acetabulum, and severe sclerosis. Left femur: Acute oblique comminuted fracture of the distal femur extending to the femoral condyle prosthesis with medial and anterior 1/2 shaft's with displacement of the shaft component. Fracture components at the level of prosthesis are obscured by the hardware. The tibial plateau and femoral head prosthesis components appear well aligned. No proximal femur fracture identified. Left knee: Acute oblique comminuted fracture of the distal femur extending to the femoral condyle prosthesis with medial and anterior 1/2 shaft's with displacement of the shaft component. Fracture components at the level of prosthesis are obscured by the hardware.  The tibial plateau and femoral head prosthesis components appear well aligned. Left tibia and fibula: There is no evidence of hip fracture or dislocation. There is no evidence of arthropathy or other focal bone abnormality. IMPRESSION: Acute oblique comminuted fracture of the distal left femur extending to the femoral condyle prosthesis with 1/2 shaft's with medial and anterior displacement of the femoral shaft components. Fracture components at level of prosthesis are obscured by the hardware. Tibial plateau and femoral head prosthesis components are well aligned. Stable severe osteoarthritis of left hip joint with deformity of femoral head. Stable chronic fracture deformities of left superior and inferior pubic rami. No acute pelvic fracture identified. Electronically Signed   By: Kristine Garbe M.D.   On: 12/20/2017 04:15   Dg Femur Min 2 Views Left  Result Date: 12/21/2017 CLINICAL DATA:  Distal femur fracture ORIF. EXAM: DG C-ARM 61-120 MIN; LEFT FEMUR 2 VIEWS COMPARISON:  Left femur x-rays from yesterday. FINDINGS: Intraoperative x-rays demonstrate interval lateral plate and screw fixation of the distal femoral periprosthetic fracture, now in near anatomic alignment. Prior left total knee arthroplasty.  IMPRESSION: Interval lateral plate and screw fixation of the distal femoral periprosthetic fracture, now in near anatomic alignment. FLUOROSCOPY TIME:  1 minutes, 27 seconds. C-arm fluoroscopic images were obtained intraoperatively and submitted for post operative interpretation. Electronically Signed   By: Titus Dubin M.D.   On: 12/21/2017 16:37   Dg Femur Min 2 Views Left  Result Date: 12/20/2017 CLINICAL DATA:  84 y/o F; 2 recent fall. Pain of the entire left leg. EXAM: DG HIP (WITH OR WITHOUT PELVIS) 2-3V LEFT; LEFT FEMUR 2 VIEWS; LEFT TIBIA AND FIBULA - 2 VIEW; LEFT KNEE - COMPLETE 4+ VIEW COMPARISON:  08/22/2017 pelvis radiographs. FINDINGS: Pelvis and left hip: Chronic fracture  deformities of left superior and inferior pubic ramus. Partially visualized right total hip prosthesis in normal alignment. No acute pelvic fracture or diastasis identified. Severe osteoarthrosis of the left hip joint with flattening deformity of the femoral head, loss of superior joint space, remodeling of the superior acetabulum, and severe sclerosis. Left femur: Acute oblique comminuted fracture of the distal femur extending to the femoral condyle prosthesis with medial and anterior 1/2 shaft's with displacement of the shaft component. Fracture components at the level of prosthesis are obscured by the hardware. The tibial plateau and femoral head prosthesis components appear well aligned. No proximal femur fracture identified. Left knee: Acute oblique comminuted fracture of the distal femur extending to the femoral condyle prosthesis with medial and anterior 1/2 shaft's with displacement of the shaft component. Fracture components at the level of prosthesis are obscured by the hardware. The tibial plateau and femoral head prosthesis components appear well aligned. Left tibia and fibula: There is no evidence of hip fracture or dislocation. There is no evidence of arthropathy or other focal bone abnormality. IMPRESSION: Acute oblique comminuted fracture of the distal left femur extending to the femoral condyle prosthesis with 1/2 shaft's with medial and anterior displacement of the femoral shaft components. Fracture components at level of prosthesis are obscured by the hardware. Tibial plateau and femoral head prosthesis components are well aligned. Stable severe osteoarthritis of left hip joint with deformity of femoral head. Stable chronic fracture deformities of left superior and inferior pubic rami. No acute pelvic fracture identified. Electronically Signed   By: Kristine Garbe M.D.   On: 12/20/2017 04:15   Dg Femur Port Min 2 Views Left  Result Date: 12/21/2017 CLINICAL DATA:  Distal femur  fracture EXAM: LEFT FEMUR PORTABLE 2 VIEWS COMPARISON:  12/20/2017 FINDINGS: Interval plate and cortical screw fixation of the distal femoral fracture. Knee prosthetic intact. IMPRESSION: Distal femur fracture dynamic plate  fixation. Electronically Signed   By: Suzy Bouchard M.D.   On: 12/21/2017 19:46    Microbiology: Recent Results (from the past 240 hour(s))  Surgical pcr screen     Status: Abnormal   Collection Time: 12/20/17 10:13 AM  Result Value Ref Range Status   MRSA, PCR POSITIVE (A) NEGATIVE Final    Comment: CRITICAL RESULT CALLED TO, READ BACK BY AND VERIFIED WITH: B. SLOOP, RN AT 1515 ON 12/20/17 BY C. JESSUP, MLT.    Staphylococcus aureus POSITIVE (A) NEGATIVE Final    Comment: (NOTE) The Xpert SA Assay (FDA approved for NASAL specimens in patients 80 years of age and older), is one component of a comprehensive surveillance program. It is not intended to diagnose infection nor to guide or monitor treatment.      Labs: Basic Metabolic Panel: Recent Labs  Lab 12/20/17 0301 12/23/17 1159  NA 141 134*  K 3.9  4.3  CL 100* 97*  CO2 29 28  GLUCOSE 121* 133*  BUN 25* 25*  CREATININE 0.88 0.81  CALCIUM 9.0 8.1*   Liver Function Tests: No results for input(s): AST, ALT, ALKPHOS, BILITOT, PROT, ALBUMIN in the last 168 hours. No results for input(s): LIPASE, AMYLASE in the last 168 hours. No results for input(s): AMMONIA in the last 168 hours. CBC: Recent Labs  Lab 12/20/17 0301 12/21/17 1021 12/23/17 1159  WBC 6.0 8.6 7.6  NEUTROABS 3.6  --   --   HGB 13.5 10.8* 8.6*  HCT 42.9 33.4* 25.9*  MCV 99.3 97.7 97.7  PLT 183 136* 129*   Cardiac Enzymes: No results for input(s): CKTOTAL, CKMB, CKMBINDEX, TROPONINI in the last 168 hours. BNP: BNP (last 3 results) Recent Labs    12/21/17 1021  BNP 68.1    ProBNP (last 3 results) No results for input(s): PROBNP in the last 8760 hours.  CBG: No results for input(s): GLUCAP in the last 168  hours.     Signed:  Kayleen Memos, MD [facility] 12/23/2017, 1:03 PM

## 2017-12-23 NOTE — Discharge Instructions (Signed)

## 2017-12-23 NOTE — Progress Notes (Signed)
Orthopaedic Trauma Progress Note  S: Patient is still aggressive and uncooperative  O:  Vitals:   12/23/17 1430 12/23/17 1437  BP: (!) 102/55   Pulse: (!) 141 90  Resp:    Temp: 98.6 F (37 C)   SpO2: 99%    Incision clean dry and intact. Uncooperative with rest of the exam  Labs:  CBC    Component Value Date/Time   WBC 7.6 12/23/2017 1159   RBC 2.65 (L) 12/23/2017 1159   HGB 8.6 (L) 12/23/2017 1159   HCT 25.9 (L) 12/23/2017 1159   PLT 129 (L) 12/23/2017 1159   MCV 97.7 12/23/2017 1159   MCH 32.5 12/23/2017 1159   MCHC 33.2 12/23/2017 1159   RDW 14.1 12/23/2017 1159   LYMPHSABS 1.6 12/20/2017 0301   MONOABS 0.5 12/20/2017 0301   EOSABS 0.3 12/20/2017 0301   BASOSABS 0.0 12/20/2017 0301    A/P: s/p ORIF of left periprosthetic distal femur  -WBAT LLE -Pain control -PT/OT if patient allows -Follow up in 2-3 weeks  Shona Needles, MD [facility] [phone] (phone)

## 2017-12-23 NOTE — NC FL2 (Signed)
[place] LEVEL OF CARE SCREENING TOOL     IDENTIFICATION  Patient Name: Erica [name] Birthdate: [birth year] Sex: female Admission Date (Current Location): 12/20/2017  [facility] and [place] Number:  [employer] and Address:  [facility], [place] [address] [place], [place] [address]      Provider Number: [id number]  Attending Physician Name and Address:  Kayleen Memos, DO  Relative Name and Phone Number:       Current Level of Care: Hospital Recommended Level of Care: [place] Prior Approval Number:    Date Approved/Denied:   PASRR Number: [id number]  Discharge Plan: SNF    Current Diagnoses: Patient Active Problem List   Diagnosis Date Noted  . Femur fracture, right ([place]) 12/20/2017  . Femur fracture, left ([place]) 12/20/2017  . Recurrent falls 08/22/2017  . Anxiety and depression 04/21/2017  . Chronic CHF ([place]) 01/04/2015  . Valvular heart disease 05/29/2014  . Moderate to severe pulmonary hypertension ([place]) 05/29/2014  . Atrial fibrillation with RVR ([place]) 05/28/2014  . C2 cervical fracture ([place]) 02/05/2013  . Hypertension 03/30/2012  . Hypothyroidism 03/30/2012  . Seizure disorder ([place]) 03/30/2012  . Hyperlipidemia 03/30/2012    Orientation RESPIRATION BLADDER Height & Weight     Self  Normal Incontinent, External catheter Weight: 150 lb (68 kg) Height:  5\' 5"  (165.1 cm)  BEHAVIORAL SYMPTOMS/MOOD NEUROLOGICAL BOWEL NUTRITION STATUS      Continent Diet(Regular Diet with Thin Liquids)  AMBULATORY STATUS COMMUNICATION OF NEEDS Skin   Extensive Assist Verbally Surgical wounds(Right Arm / Left Leg Incision)                       Personal Care Assistance Level of Assistance  Bathing, Feeding, Dressing Bathing Assistance: Maximum assistance Feeding assistance: Limited assistance Dressing Assistance: Maximum assistance     Functional Limitations Info  Sight, Hearing, Speech Sight  Info: Adequate Hearing Info: Adequate Speech Info: Adequate    SPECIAL CARE FACTORS FREQUENCY  PT (By licensed PT), OT (By licensed OT)                    Contractures Contractures Info: Not present    Additional Factors Info  Code Status, Allergies, Isolation Precautions Code Status Info: DNR Allergies Info: Clarithromycin, Ditropan Oxybutynin Chloride, Prednisone, Vioxx Rofecoxib, Biaxin Clarithromycin, Erythromycin, Keflex Cephalexin, Macrodantin Nitrofurantoin Macrocrystal     Isolation Precautions Info: MRSA     Current Medications (12/23/2017):  This is the current hospital active medication list Current Facility-Administered Medications  Medication Dose Route Frequency Provider Last Rate Last Dose  . apixaban (ELIQUIS) tablet 2.5 mg  2.5 mg Oral BID Romona Curls, RPH   2.5 mg at 12/23/17 6568  . busPIRone (BUSPAR) tablet 5 mg  5 mg Oral BID Oswald Hillock, MD   5 mg at 12/23/17 0859  . Chlorhexidine Gluconate Cloth 2 % PADS 6 each  6 each Topical Q0600 Haddix, Thomasene Lot, MD   6 each at 12/23/17 0640  . diclofenac sodium (VOLTAREN) 1 % transdermal gel 2 g  2 g Topical TID Hall, Carole N, DO      . feeding supplement (ENSURE ENLIVE) (ENSURE ENLIVE) liquid 237 mL  237 mL Oral BID BM Hall, Carole N, DO   237 mL at 12/23/17 0910  . lactated ringers infusion   Intravenous Continuous Roderic Palau, MD 10 mL/hr at 12/21/17 1334    . levothyroxine (SYNTHROID, LEVOTHROID) tablet 12.5  mcg  12.5 mcg Oral QAC breakfast Oswald Hillock, MD   12.5 mcg at 12/23/17 0859  . levothyroxine (SYNTHROID, LEVOTHROID) tablet 175 mcg  175 mcg Oral QAC breakfast Oswald Hillock, MD   175 mcg at 12/23/17 0900  . [START ON 12/24/2017] metoprolol succinate (TOPROL-XL) 24 hr tablet 12.5 mg  12.5 mg Oral Daily Hall, Carole N, DO      . mupirocin ointment (BACTROBAN) 2 % 1 application  1 application Nasal BID Haddix, Thomasene Lot, MD   1 application at 29/92/42 0910  . phenytoin (DILANTIN) ER capsule 100 mg   100 mg Oral BID Skeet Simmer, RPH   100 mg at 12/23/17 0900   And  . phenytoin (DILANTIN) chewable tablet 25 mg  25 mg Oral BID Skeet Simmer, RPH   25 mg at 12/23/17 6834  . potassium chloride SA (K-DUR,KLOR-CON) CR tablet 40 mEq  40 mEq Oral Daily Oswald Hillock, MD   40 mEq at 12/23/17 0901  . spironolactone (ALDACTONE) tablet 25 mg  25 mg Oral BID Oswald Hillock, MD   25 mg at 12/23/17 0900     Discharge Medications: Please see discharge summary for a list of discharge medications.  Relevant Imaging Results:  Relevant Lab Results:   Additional Information [id number]    Barbette Or, [place]

## 2017-12-23 NOTE — Clinical Social Work Note (Signed)
Clinical Social Worker facilitated patient discharge including contacting patient family and facility to confirm patient discharge plans.  Clinical information faxed to facility and family agreeable with plan.  CSW arranged ambulance transport via [facility] to [facility].  RN to call report prior to discharge [phone]).  Clinical Social Worker will sign off for now as social work intervention is no longer needed. Please consult [place] again if new need arises.  Barbette Or, [place]

## 2017-12-26 ENCOUNTER — Encounter: Payer: Self-pay | Admitting: Internal Medicine

## 2017-12-26 ENCOUNTER — Encounter (HOSPITAL_COMMUNITY)
Admission: RE | Admit: 2017-12-26 | Discharge: 2017-12-26 | Disposition: A | Discharge status: Home / Self Care | Payer: Medicare Other | Source: Skilled Nursing Facility | Attending: Internal Medicine | Admitting: Internal Medicine

## 2017-12-26 ENCOUNTER — Non-Acute Institutional Stay (SKILLED_NURSING_FACILITY): Payer: Medicare Other | Admitting: Internal Medicine

## 2017-12-26 DIAGNOSIS — S7292XS Unspecified fracture of left femur, sequela: Secondary | ICD-10-CM | POA: Diagnosis not present

## 2017-12-26 DIAGNOSIS — D509 Iron deficiency anemia, unspecified: Secondary | ICD-10-CM | POA: Insufficient documentation

## 2017-12-26 DIAGNOSIS — I10 Essential (primary) hypertension: Secondary | ICD-10-CM

## 2017-12-26 DIAGNOSIS — F419 Anxiety disorder, unspecified: Secondary | ICD-10-CM

## 2017-12-26 DIAGNOSIS — E038 Other specified hypothyroidism: Secondary | ICD-10-CM

## 2017-12-26 DIAGNOSIS — F329 Major depressive disorder, single episode, unspecified: Secondary | ICD-10-CM | POA: Diagnosis not present

## 2017-12-26 DIAGNOSIS — R296 Repeated falls: Secondary | ICD-10-CM | POA: Diagnosis not present

## 2017-12-26 DIAGNOSIS — Z9181 History of falling: Secondary | ICD-10-CM | POA: Insufficient documentation

## 2017-12-26 DIAGNOSIS — Z4789 Encounter for other orthopedic aftercare: Secondary | ICD-10-CM | POA: Insufficient documentation

## 2017-12-26 DIAGNOSIS — G40909 Epilepsy, unspecified, not intractable, without status epilepticus: Secondary | ICD-10-CM | POA: Diagnosis not present

## 2017-12-26 DIAGNOSIS — I4891 Unspecified atrial fibrillation: Secondary | ICD-10-CM | POA: Diagnosis not present

## 2017-12-26 DIAGNOSIS — M9702XD Periprosthetic fracture around internal prosthetic left hip joint, subsequent encounter: Secondary | ICD-10-CM | POA: Insufficient documentation

## 2017-12-26 DIAGNOSIS — F32A Depression, unspecified: Secondary | ICD-10-CM
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
## 2017-12-27 ENCOUNTER — Other Ambulatory Visit: Payer: Self-pay

## 2017-12-27 MED ORDER — TRAMADOL HCL 50 MG PO TABS: 50.0000 mg | ORAL_TABLET | Freq: Four times a day (QID) | ORAL | 0 refills | Status: DC | PRN

## 2017-12-27 NOTE — Telephone Encounter (Signed)
RX Fax for [facility]@ [phone]  

## 2017-12-29 ENCOUNTER — Encounter (HOSPITAL_COMMUNITY)
Admission: RE | Admit: 2017-12-29 | Discharge: 2017-12-29 | Disposition: A | Discharge status: Home / Self Care | Payer: Medicare Other | Source: Skilled Nursing Facility | Attending: Internal Medicine | Admitting: Internal Medicine

## 2017-12-29 DIAGNOSIS — Z9181 History of falling: Secondary | ICD-10-CM | POA: Insufficient documentation

## 2017-12-29 DIAGNOSIS — Z4789 Encounter for other orthopedic aftercare: Secondary | ICD-10-CM | POA: Insufficient documentation

## 2017-12-29 DIAGNOSIS — M9702XD Periprosthetic fracture around internal prosthetic left hip joint, subsequent encounter: Secondary | ICD-10-CM | POA: Insufficient documentation

## 2017-12-29 LAB — CBC WITH DIFFERENTIAL/PLATELET
BASOS ABS: 0 10*3/uL (ref 0.0–0.1)
Basophils Relative: 1 %
EOS ABS: 0.3 10*3/uL (ref 0.0–0.7)
Eosinophils Relative: 6 %
HCT: 28.4 % — ABNORMAL LOW (ref 36.0–46.0)
HEMOGLOBIN: 9.1 g/dL — AB (ref 12.0–15.0)
LYMPHS ABS: 1 10*3/uL (ref 0.7–4.0)
LYMPHS PCT: 18 %
MCH: 32.6 pg (ref 26.0–34.0)
MCHC: 32 g/dL (ref 30.0–36.0)
MCV: 101.8 fL — ABNORMAL HIGH (ref 78.0–100.0)
Monocytes Absolute: 0.5 10*3/uL (ref 0.1–1.0)
Monocytes Relative: 10 %
NEUTROS PCT: 65 %
Neutro Abs: 3.6 10*3/uL (ref 1.7–7.7)
PLATELETS: 209 10*3/uL (ref 150–400)
RBC: 2.79 MIL/uL — ABNORMAL LOW (ref 3.87–5.11)
RDW: 16 % — ABNORMAL HIGH (ref 11.5–15.5)
WBC: 5.4 10*3/uL (ref 4.0–10.5)

## 2017-12-29 LAB — BASIC METABOLIC PANEL
Anion gap: 11 (ref 5–15)
BUN: 17 mg/dL (ref 6–20)
CHLORIDE: 101 mmol/L (ref 101–111)
CO2: 20 mmol/L — ABNORMAL LOW (ref 22–32)
CREATININE: 0.59 mg/dL (ref 0.44–1.00)
Calcium: 8.1 mg/dL — ABNORMAL LOW (ref 8.9–10.3)
Glucose, Bld: 156 mg/dL — ABNORMAL HIGH (ref 65–99)
POTASSIUM: 4.2 mmol/L (ref 3.5–5.1)
SODIUM: 132 mmol/L — AB (ref 135–145)

## 2018-01-03 DIAGNOSIS — M9712XD Periprosthetic fracture around internal prosthetic left knee joint, subsequent encounter: Secondary | ICD-10-CM | POA: Diagnosis not present

## 2018-01-09 NOTE — Anesthesia Postprocedure Evaluation (Signed)
Anesthesia Post Note  Patient: Erica [name]  Procedure(s) Performed: OPEN REDUCTION INTERNAL FIXATION (ORIF) DISTAL FEMUR FRACTURE (Left )     Patient location during evaluation: PACU Anesthesia Type: General Level of consciousness: awake and sedated Pain management: pain level controlled Vital Signs Assessment: post-procedure vital signs reviewed and stable Respiratory status: spontaneous breathing, nonlabored ventilation, respiratory function stable and patient connected to nasal cannula oxygen Cardiovascular status: blood pressure returned to baseline and stable Postop Assessment: no apparent nausea or vomiting Anesthetic complications: no    Last Vitals:  Vitals:   12/23/17 1430 12/23/17 1437  BP: (!) 102/55   Pulse: (!) 141 90  Resp:    Temp: 37 C   SpO2: 99%     Last Pain:  Vitals:   12/23/17 1430  TempSrc: Oral  PainSc:                  Nohealani Medinger,JAMES TERRILL

## 2018-01-12 ENCOUNTER — Encounter (HOSPITAL_COMMUNITY)
Admission: RE | Admit: 2018-01-12 | Discharge: 2018-01-12 | Disposition: A | Discharge status: Home / Self Care | Payer: Medicare Other | Source: Skilled Nursing Facility | Attending: Internal Medicine | Admitting: Internal Medicine

## 2018-01-12 DIAGNOSIS — D509 Iron deficiency anemia, unspecified: Secondary | ICD-10-CM | POA: Insufficient documentation

## 2018-01-12 DIAGNOSIS — M6281 Muscle weakness (generalized): Secondary | ICD-10-CM | POA: Insufficient documentation

## 2018-01-12 DIAGNOSIS — F329 Major depressive disorder, single episode, unspecified: Secondary | ICD-10-CM | POA: Insufficient documentation

## 2018-01-12 DIAGNOSIS — E039 Hypothyroidism, unspecified: Secondary | ICD-10-CM | POA: Insufficient documentation

## 2018-01-12 DIAGNOSIS — I5042 Chronic combined systolic (congestive) and diastolic (congestive) heart failure: Secondary | ICD-10-CM | POA: Insufficient documentation

## 2018-01-13 ENCOUNTER — Encounter (HOSPITAL_COMMUNITY)
Admission: RE | Admit: 2018-01-13 | Discharge: 2018-01-13 | Disposition: A | Discharge status: Home / Self Care | Payer: Medicare Other | Source: Skilled Nursing Facility | Attending: Internal Medicine | Admitting: Internal Medicine

## 2018-01-13 DIAGNOSIS — R279 Unspecified lack of coordination: Secondary | ICD-10-CM | POA: Insufficient documentation

## 2018-01-13 DIAGNOSIS — E039 Hypothyroidism, unspecified: Secondary | ICD-10-CM | POA: Insufficient documentation

## 2018-01-13 DIAGNOSIS — M9702XD Periprosthetic fracture around internal prosthetic left hip joint, subsequent encounter: Secondary | ICD-10-CM | POA: Insufficient documentation

## 2018-01-13 DIAGNOSIS — Z4789 Encounter for other orthopedic aftercare: Secondary | ICD-10-CM | POA: Insufficient documentation

## 2018-01-16 NOTE — Anesthesia Postprocedure Evaluation (Signed)
Anesthesia Post Note  Patient: Erica [name]  Procedure(s) Performed: OPEN REDUCTION INTERNAL FIXATION (ORIF) DISTAL FEMUR FRACTURE (Left )     Patient location during evaluation: PACU Anesthesia Type: General Level of consciousness: awake and alert, awake and sedated Pain management: pain level controlled Vital Signs Assessment: post-procedure vital signs reviewed and stable Respiratory status: spontaneous breathing, nonlabored ventilation, respiratory function stable and patient connected to nasal cannula oxygen Cardiovascular status: blood pressure returned to baseline and stable Postop Assessment: no apparent nausea or vomiting Anesthetic complications: no    Last Vitals:  Vitals:   12/23/17 1430 12/23/17 1437  BP: (!) 102/55   Pulse: (!) 141 90  Resp:    Temp: 37 C   SpO2: 99%     Last Pain:  Vitals:   12/23/17 1430  TempSrc: Oral  PainSc:                  Jourdin Gens,JAMES TERRILL

## 2018-01-16 NOTE — Addendum Note (Signed)
Addendum  created 01/16/18 1339 by Rica Koyanagi, MD   Sign clinical note

## 2018-01-18 ENCOUNTER — Encounter: Payer: Self-pay | Admitting: Internal Medicine

## 2018-01-18 ENCOUNTER — Non-Acute Institutional Stay (SKILLED_NURSING_FACILITY): Payer: Medicare Other | Admitting: Internal Medicine

## 2018-01-18 DIAGNOSIS — G40909 Epilepsy, unspecified, not intractable, without status epilepticus: Secondary | ICD-10-CM

## 2018-01-18 DIAGNOSIS — E038 Other specified hypothyroidism: Secondary | ICD-10-CM

## 2018-01-18 DIAGNOSIS — S7292XS Unspecified fracture of left femur, sequela: Secondary | ICD-10-CM | POA: Diagnosis not present

## 2018-01-18 DIAGNOSIS — I5042 Chronic combined systolic (congestive) and diastolic (congestive) heart failure: Secondary | ICD-10-CM | POA: Diagnosis not present

## 2018-01-18 DIAGNOSIS — R296 Repeated falls: Secondary | ICD-10-CM | POA: Diagnosis not present

## 2018-01-18 DIAGNOSIS — I4891 Unspecified atrial fibrillation: Secondary | ICD-10-CM | POA: Diagnosis not present
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
## 2018-01-19 ENCOUNTER — Encounter (HOSPITAL_COMMUNITY)
Admission: RE | Admit: 2018-01-19 | Discharge: 2018-01-19 | Disposition: A | Discharge status: Home / Self Care | Payer: Medicare Other | Source: Skilled Nursing Facility | Attending: Internal Medicine | Admitting: Internal Medicine

## 2018-01-19 DIAGNOSIS — I5042 Chronic combined systolic (congestive) and diastolic (congestive) heart failure: Secondary | ICD-10-CM | POA: Insufficient documentation

## 2018-01-19 DIAGNOSIS — D509 Iron deficiency anemia, unspecified: Secondary | ICD-10-CM | POA: Insufficient documentation

## 2018-01-19 DIAGNOSIS — M9702XD Periprosthetic fracture around internal prosthetic left hip joint, subsequent encounter: Secondary | ICD-10-CM | POA: Insufficient documentation

## 2018-01-19 DIAGNOSIS — Z4789 Encounter for other orthopedic aftercare: Secondary | ICD-10-CM | POA: Insufficient documentation

## 2018-01-19 DIAGNOSIS — Z9181 History of falling: Secondary | ICD-10-CM | POA: Insufficient documentation

## 2018-01-27 DIAGNOSIS — F0391 Unspecified dementia with behavioral disturbance: Secondary | ICD-10-CM | POA: Diagnosis not present

## 2018-01-27 DIAGNOSIS — F4322 Adjustment disorder with anxiety: Secondary | ICD-10-CM | POA: Diagnosis not present

## 2018-01-31 ENCOUNTER — Non-Acute Institutional Stay (SKILLED_NURSING_FACILITY): Payer: Medicare Other | Admitting: Internal Medicine

## 2018-01-31 ENCOUNTER — Encounter: Payer: Self-pay | Admitting: Internal Medicine

## 2018-01-31 DIAGNOSIS — D4981 Neoplasm of unspecified behavior of retina and choroid: Secondary | ICD-10-CM | POA: Diagnosis not present

## 2018-01-31 DIAGNOSIS — G40909 Epilepsy, unspecified, not intractable, without status epilepticus: Secondary | ICD-10-CM | POA: Diagnosis not present

## 2018-01-31 DIAGNOSIS — R296 Repeated falls: Secondary | ICD-10-CM | POA: Diagnosis not present

## 2018-01-31 DIAGNOSIS — I10 Essential (primary) hypertension: Secondary | ICD-10-CM

## 2018-01-31 DIAGNOSIS — F329 Major depressive disorder, single episode, unspecified: Secondary | ICD-10-CM

## 2018-01-31 DIAGNOSIS — F32A Depression, unspecified: Secondary | ICD-10-CM

## 2018-01-31 DIAGNOSIS — I4891 Unspecified atrial fibrillation: Secondary | ICD-10-CM

## 2018-01-31 DIAGNOSIS — Z961 Presence of intraocular lens: Secondary | ICD-10-CM | POA: Diagnosis not present

## 2018-01-31 DIAGNOSIS — Z7901 Long term (current) use of anticoagulants: Secondary | ICD-10-CM | POA: Diagnosis not present

## 2018-01-31 DIAGNOSIS — I5042 Chronic combined systolic (congestive) and diastolic (congestive) heart failure: Secondary | ICD-10-CM

## 2018-01-31 DIAGNOSIS — E785 Hyperlipidemia, unspecified: Secondary | ICD-10-CM | POA: Diagnosis not present

## 2018-01-31 DIAGNOSIS — E038 Other specified hypothyroidism: Secondary | ICD-10-CM

## 2018-01-31 DIAGNOSIS — F419 Anxiety disorder, unspecified: Secondary | ICD-10-CM | POA: Diagnosis not present
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
## 2018-02-01 ENCOUNTER — Encounter (HOSPITAL_COMMUNITY)
Admission: RE | Admit: 2018-02-01 | Discharge: 2018-02-01 | Disposition: A | Discharge status: Home / Self Care | Payer: Medicare Other | Source: Skilled Nursing Facility | Attending: *Deleted | Admitting: *Deleted

## 2018-02-01 DIAGNOSIS — D509 Iron deficiency anemia, unspecified: Secondary | ICD-10-CM | POA: Diagnosis not present

## 2018-02-01 DIAGNOSIS — E039 Hypothyroidism, unspecified: Secondary | ICD-10-CM | POA: Diagnosis not present

## 2018-02-01 DIAGNOSIS — F329 Major depressive disorder, single episode, unspecified: Secondary | ICD-10-CM | POA: Diagnosis not present

## 2018-02-01 DIAGNOSIS — I5042 Chronic combined systolic (congestive) and diastolic (congestive) heart failure: Secondary | ICD-10-CM | POA: Diagnosis not present

## 2018-02-01 DIAGNOSIS — M6281 Muscle weakness (generalized): Secondary | ICD-10-CM | POA: Diagnosis not present

## 2018-02-01 LAB — CBC
HCT: 40.2 % (ref 36.0–46.0)
HEMOGLOBIN: 12.8 g/dL (ref 12.0–15.0)
MCH: 32.6 pg (ref 26.0–34.0)
MCHC: 31.8 g/dL (ref 30.0–36.0)
MCV: 102.3 fL — ABNORMAL HIGH (ref 78.0–100.0)
PLATELETS: 163 10*3/uL (ref 150–400)
RBC: 3.93 MIL/uL (ref 3.87–5.11)
RDW: 14.1 % (ref 11.5–15.5)
WBC: 5.6 10*3/uL (ref 4.0–10.5)

## 2018-02-01 LAB — COMPREHENSIVE METABOLIC PANEL
ALBUMIN: 3 g/dL — AB (ref 3.5–5.0)
ALT: 17 U/L (ref 14–54)
AST: 23 U/L (ref 15–41)
Alkaline Phosphatase: 143 U/L — ABNORMAL HIGH (ref 38–126)
Anion gap: 10 (ref 5–15)
BUN: 21 mg/dL — ABNORMAL HIGH (ref 6–20)
CHLORIDE: 102 mmol/L (ref 101–111)
CO2: 28 mmol/L (ref 22–32)
CREATININE: 0.77 mg/dL (ref 0.44–1.00)
Calcium: 8.6 mg/dL — ABNORMAL LOW (ref 8.9–10.3)
GFR calc non Af Amer: >60 mL/min (ref >60)
GLUCOSE: 103 mg/dL — AB (ref 65–99)
Potassium: 3.9 mmol/L (ref 3.5–5.1)
SODIUM: 140 mmol/L (ref 135–145)
Total Bilirubin: 0.6 mg/dL (ref 0.3–1.2)
Total Protein: 5.9 g/dL — ABNORMAL LOW (ref 6.5–8.1)

## 2018-02-01 LAB — TSH: TSH: 8.963 u[IU]/mL — ABNORMAL HIGH (ref 0.350–4.500)

## 2018-02-01 LAB — PHENYTOIN LEVEL, TOTAL: Phenytoin Lvl: 17.7 ug/mL (ref 10.0–20.0)

## 2018-02-07 DIAGNOSIS — M9712XD Periprosthetic fracture around internal prosthetic left knee joint, subsequent encounter: Secondary | ICD-10-CM | POA: Diagnosis not present

## 2018-02-22 ENCOUNTER — Encounter: Payer: Self-pay | Admitting: Internal Medicine

## 2018-02-22 ENCOUNTER — Non-Acute Institutional Stay (SKILLED_NURSING_FACILITY): Payer: Medicare Other | Admitting: Internal Medicine

## 2018-02-22 DIAGNOSIS — E038 Other specified hypothyroidism: Secondary | ICD-10-CM | POA: Diagnosis not present

## 2018-02-22 DIAGNOSIS — H109 Unspecified conjunctivitis: Secondary | ICD-10-CM
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
## 2018-02-23 ENCOUNTER — Encounter (HOSPITAL_COMMUNITY)
Admission: RE | Admit: 2018-02-23 | Discharge: 2018-02-23 | Disposition: A | Discharge status: Home / Self Care | Payer: Medicare Other | Source: Skilled Nursing Facility | Attending: Internal Medicine | Admitting: Internal Medicine

## 2018-02-23 DIAGNOSIS — M9702XD Periprosthetic fracture around internal prosthetic left hip joint, subsequent encounter: Secondary | ICD-10-CM | POA: Insufficient documentation

## 2018-02-23 DIAGNOSIS — Z9181 History of falling: Secondary | ICD-10-CM | POA: Insufficient documentation

## 2018-02-23 DIAGNOSIS — I5042 Chronic combined systolic (congestive) and diastolic (congestive) heart failure: Secondary | ICD-10-CM | POA: Insufficient documentation

## 2018-02-23 DIAGNOSIS — D509 Iron deficiency anemia, unspecified: Secondary | ICD-10-CM | POA: Insufficient documentation

## 2018-02-23 DIAGNOSIS — Z4789 Encounter for other orthopedic aftercare: Secondary | ICD-10-CM | POA: Insufficient documentation

## 2018-04-04 ENCOUNTER — Encounter (HOSPITAL_COMMUNITY)
Admission: RE | Admit: 2018-04-04 | Discharge: 2018-04-04 | Disposition: A | Discharge status: Home / Self Care | Payer: Medicare Other | Source: Skilled Nursing Facility | Attending: Internal Medicine | Admitting: Internal Medicine

## 2018-04-04 ENCOUNTER — Encounter: Payer: Self-pay | Admitting: Internal Medicine

## 2018-04-04 ENCOUNTER — Non-Acute Institutional Stay (SKILLED_NURSING_FACILITY): Payer: Medicare Other | Admitting: Internal Medicine

## 2018-04-04 DIAGNOSIS — I4891 Unspecified atrial fibrillation: Secondary | ICD-10-CM

## 2018-04-04 DIAGNOSIS — Z4789 Encounter for other orthopedic aftercare: Secondary | ICD-10-CM | POA: Insufficient documentation

## 2018-04-04 DIAGNOSIS — M9702XD Periprosthetic fracture around internal prosthetic left hip joint, subsequent encounter: Secondary | ICD-10-CM | POA: Diagnosis not present

## 2018-04-04 DIAGNOSIS — E039 Hypothyroidism, unspecified: Secondary | ICD-10-CM | POA: Insufficient documentation

## 2018-04-04 DIAGNOSIS — E038 Other specified hypothyroidism: Secondary | ICD-10-CM

## 2018-04-04 DIAGNOSIS — R296 Repeated falls: Secondary | ICD-10-CM

## 2018-04-04 DIAGNOSIS — D509 Iron deficiency anemia, unspecified: Secondary | ICD-10-CM | POA: Diagnosis not present

## 2018-04-04 DIAGNOSIS — I5042 Chronic combined systolic (congestive) and diastolic (congestive) heart failure: Secondary | ICD-10-CM

## 2018-04-04 LAB — TSH: TSH: 8.901 u[IU]/mL — AB (ref 0.350–4.500)
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
## 2018-04-10 DIAGNOSIS — R262 Difficulty in walking, not elsewhere classified: Secondary | ICD-10-CM | POA: Diagnosis not present

## 2018-04-10 DIAGNOSIS — B351 Tinea unguium: Secondary | ICD-10-CM | POA: Diagnosis not present

## 2018-04-10 DIAGNOSIS — I739 Peripheral vascular disease, unspecified: Secondary | ICD-10-CM | POA: Diagnosis not present

## 2018-05-08 ENCOUNTER — Encounter: Payer: Self-pay | Admitting: Internal Medicine

## 2018-05-08 ENCOUNTER — Non-Acute Institutional Stay (SKILLED_NURSING_FACILITY): Payer: Medicare Other | Admitting: Internal Medicine

## 2018-05-08 DIAGNOSIS — F419 Anxiety disorder, unspecified: Secondary | ICD-10-CM | POA: Diagnosis not present

## 2018-05-08 DIAGNOSIS — I5042 Chronic combined systolic (congestive) and diastolic (congestive) heart failure: Secondary | ICD-10-CM | POA: Diagnosis not present

## 2018-05-08 DIAGNOSIS — I10 Essential (primary) hypertension: Secondary | ICD-10-CM | POA: Diagnosis not present

## 2018-05-08 DIAGNOSIS — G40909 Epilepsy, unspecified, not intractable, without status epilepticus: Secondary | ICD-10-CM | POA: Diagnosis not present

## 2018-05-08 DIAGNOSIS — F329 Major depressive disorder, single episode, unspecified: Secondary | ICD-10-CM

## 2018-05-08 DIAGNOSIS — I4891 Unspecified atrial fibrillation: Secondary | ICD-10-CM

## 2018-05-08 DIAGNOSIS — E038 Other specified hypothyroidism: Secondary | ICD-10-CM | POA: Diagnosis not present

## 2018-05-08 DIAGNOSIS — S7292XS Unspecified fracture of left femur, sequela: Secondary | ICD-10-CM | POA: Diagnosis not present

## 2018-05-08 DIAGNOSIS — F32A Depression, unspecified: Secondary | ICD-10-CM
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
## 2018-05-09 DIAGNOSIS — M9712XD Periprosthetic fracture around internal prosthetic left knee joint, subsequent encounter: Secondary | ICD-10-CM | POA: Diagnosis not present

## 2018-05-10 DIAGNOSIS — F0391 Unspecified dementia with behavioral disturbance: Secondary | ICD-10-CM | POA: Diagnosis not present

## 2018-05-10 DIAGNOSIS — F4322 Adjustment disorder with anxiety: Secondary | ICD-10-CM | POA: Diagnosis not present

## 2018-07-03 DIAGNOSIS — I739 Peripheral vascular disease, unspecified: Secondary | ICD-10-CM | POA: Diagnosis not present

## 2018-07-03 DIAGNOSIS — B351 Tinea unguium: Secondary | ICD-10-CM | POA: Diagnosis not present

## 2018-07-08 DIAGNOSIS — F4322 Adjustment disorder with anxiety: Secondary | ICD-10-CM | POA: Diagnosis not present

## 2018-07-08 DIAGNOSIS — F0391 Unspecified dementia with behavioral disturbance: Secondary | ICD-10-CM | POA: Diagnosis not present

## 2018-07-25 ENCOUNTER — Non-Acute Institutional Stay (SKILLED_NURSING_FACILITY): Payer: Medicare Other | Admitting: Internal Medicine

## 2018-07-25 ENCOUNTER — Non-Acute Institutional Stay (SKILLED_NURSING_FACILITY): Payer: Medicare Other

## 2018-07-25 ENCOUNTER — Encounter: Payer: Self-pay | Admitting: Internal Medicine

## 2018-07-25 DIAGNOSIS — Z0000 Encounter for general adult medical examination without abnormal findings: Secondary | ICD-10-CM | POA: Diagnosis not present

## 2018-07-25 DIAGNOSIS — S51011D Laceration without foreign body of right elbow, subsequent encounter: Secondary | ICD-10-CM

## 2018-07-25 DIAGNOSIS — I5042 Chronic combined systolic (congestive) and diastolic (congestive) heart failure: Secondary | ICD-10-CM

## 2018-07-25 DIAGNOSIS — E038 Other specified hypothyroidism: Secondary | ICD-10-CM

## 2018-07-25 DIAGNOSIS — I10 Essential (primary) hypertension: Secondary | ICD-10-CM

## 2018-07-25 DIAGNOSIS — I4891 Unspecified atrial fibrillation: Secondary | ICD-10-CM
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
## 2018-07-25 NOTE — Patient Instructions (Signed)
[name] , Thank you for taking time to come for your Medicare Wellness Visit. I appreciate your ongoing commitment to your health goals. Please review the following plan we discussed and let me know if I can assist you in the future.   Screening recommendations/referrals: Colonoscopy excluded, over age 83 Mammogram excluded, over age 83 Bone Density excluded due to age Recommended yearly ophthalmology/optometry visit for glaucoma screening and checkup Recommended yearly dental visit for hygiene and checkup  Vaccinations: Influenza vaccine due, will receive at [facility] Pneumococcal vaccine up to date, completed Tdap vaccine up to date, due 08/14/2027 Shingles vaccine not in past records    Advanced directives: in chart  Conditions/risks identified: none  Next appointment: Dr. Lyndel Safe makes rounds   Preventive Care 65 Years and Older, Female Preventive care refers to lifestyle choices and visits with your health care provider that can promote health and wellness. What does preventive care include?  A yearly physical exam. This is also called an annual well check.  Dental exams once or twice a year.  Routine eye exams. Ask your health care provider how often you should have your eyes checked.  Personal lifestyle choices, including:  Daily care of your teeth and gums.  Regular physical activity.  Eating a healthy diet.  Avoiding tobacco and drug use.  Limiting alcohol use.  Practicing safe sex.  Taking low-dose aspirin every day.  Taking vitamin and mineral supplements as recommended by your health care provider. What happens during an annual well check? The services and screenings done by your health care provider during your annual well check will depend on your age, overall health, lifestyle risk factors, and family history of disease. Counseling  Your health care provider may ask you questions about your:  Alcohol use.  Tobacco use.  Drug use.  Emotional  well-being.  Home and relationship well-being.  Sexual activity.  Eating habits.  History of falls.  Memory and ability to understand (cognition).  Work and work Statistician.  Reproductive health. Screening  You may have the following tests or measurements:  Height, weight, and BMI.  Blood pressure.  Lipid and cholesterol levels. These may be checked every 5 years, or more frequently if you are over 18 years old.  Skin check.  Lung cancer screening. You may have this screening every year starting at age 66 if you have a 30-pack-year history of smoking and currently smoke or have quit within the past 15 years.  Fecal occult blood test (FOBT) of the stool. You may have this test every year starting at age 69.  Flexible sigmoidoscopy or colonoscopy. You may have a sigmoidoscopy every 5 years or a colonoscopy every 10 years starting at age 64.  Hepatitis C blood test.  Hepatitis B blood test.  Sexually transmitted disease (STD) testing.  Diabetes screening. This is done by checking your blood sugar (glucose) after you have not eaten for a while (fasting). You may have this done every 1-3 years.  Bone density scan. This is done to screen for osteoporosis. You may have this done starting at age 14.  Mammogram. This may be done every 1-2 years. Talk to your health care provider about how often you should have regular mammograms. Talk with your health care provider about your test results, treatment options, and if necessary, the need for more tests. Vaccines  Your health care provider may recommend certain vaccines, such as:  Influenza vaccine. This is recommended every year.  Tetanus, diphtheria, and acellular pertussis (Tdap, Td)  vaccine. You may need a Td booster every 10 years.  Zoster vaccine. You may need this after age 42.  Pneumococcal 13-valent conjugate (PCV13) vaccine. One dose is recommended after age 4.  Pneumococcal polysaccharide (PPSV23) vaccine. One  dose is recommended after age 2. Talk to your health care provider about which screenings and vaccines you need and how often you need them. This information is not intended to replace advice given to you by your health care provider. Make sure you discuss any questions you have with your health care provider. Document Released: 10/24/2015 Document Revised: 06/16/2016 Document Reviewed: 07/29/2015 Elsevier Interactive Patient Education  2017 [place] Prevention in the Home Falls can cause injuries. They can happen to people of all ages. There are many things you can do to make your home safe and to help prevent falls. What can I do on the outside of my home?  Regularly fix the edges of walkways and driveways and fix any cracks.  Remove anything that might make you trip as you walk through a door, such as a raised step or threshold.  Trim any bushes or trees on the path to your home.  Use bright outdoor lighting.  Clear any walking paths of anything that might make someone trip, such as rocks or tools.  Regularly check to see if handrails are loose or broken. Make sure that both sides of any steps have handrails.  Any raised decks and porches should have guardrails on the edges.  Have any leaves, snow, or ice cleared regularly.  Use sand or salt on walking paths during winter.  Clean up any spills in your garage right away. This includes oil or grease spills. What can I do in the bathroom?  Use night lights.  Install grab bars by the toilet and in the tub and shower. Do not use towel bars as grab bars.  Use non-skid mats or decals in the tub or shower.  If you need to sit down in the shower, use a plastic, non-slip stool.  Keep the floor dry. Clean up any water that spills on the floor as soon as it happens.  Remove soap buildup in the tub or shower regularly.  Attach bath mats securely with double-sided non-slip rug tape.  Do not have throw rugs and other  things on the floor that can make you trip. What can I do in the bedroom?  Use night lights.  Make sure that you have a light by your bed that is easy to reach.  Do not use any sheets or blankets that are too big for your bed. They should not hang down onto the floor.  Have a firm chair that has side arms. You can use this for support while you get dressed.  Do not have throw rugs and other things on the floor that can make you trip. What can I do in the kitchen?  Clean up any spills right away.  Avoid walking on wet floors.  Keep items that you use a lot in easy-to-reach places.  If you need to reach something above you, use a strong step stool that has a grab bar.  Keep electrical cords out of the way.  Do not use floor polish or wax that makes floors slippery. If you must use wax, use non-skid floor wax.  Do not have throw rugs and other things on the floor that can make you trip. What can I do with my stairs?  Do not leave  any items on the stairs.  Make sure that there are handrails on both sides of the stairs and use them. Fix handrails that are broken or loose. Make sure that handrails are as long as the stairways.  Check any carpeting to make sure that it is firmly attached to the stairs. Fix any carpet that is loose or worn.  Avoid having throw rugs at the top or bottom of the stairs. If you do have throw rugs, attach them to the floor with carpet tape.  Make sure that you have a light switch at the top of the stairs and the bottom of the stairs. If you do not have them, ask someone to add them for you. What else can I do to help prevent falls?  Wear shoes that:  Do not have high heels.  Have rubber bottoms.  Are comfortable and fit you well.  Are closed at the toe. Do not wear sandals.  If you use a stepladder:  Make sure that it is fully opened. Do not climb a closed stepladder.  Make sure that both sides of the stepladder are locked into place.  Ask  someone to hold it for you, if possible.  Clearly mark and make sure that you can see:  Any grab bars or handrails.  First and last steps.  Where the edge of each step is.  Use tools that help you move around (mobility aids) if they are needed. These include:  Canes.  Walkers.  Scooters.  Crutches.  Turn on the lights when you go into a dark area. Replace any light bulbs as soon as they burn out.  Set up your furniture so you have a clear path. Avoid moving your furniture around.  If any of your floors are uneven, fix them.  If there are any pets around you, be aware of where they are.  Review your medicines with your doctor. Some medicines can make you feel dizzy. This can increase your chance of falling. Ask your doctor what other things that you can do to help prevent falls. This information is not intended to replace advice given to you by your health care provider. Make sure you discuss any questions you have with your health care provider. Document Released: 07/24/2009 Document Revised: 03/04/2016 Document Reviewed: 11/01/2014 Elsevier Interactive Patient Education  2017 [employer].

## 2018-07-26 ENCOUNTER — Encounter (HOSPITAL_COMMUNITY)
Admission: RE | Admit: 2018-07-26 | Discharge: 2018-07-26 | Disposition: A | Discharge status: Home / Self Care | Payer: Medicare Other | Source: Skilled Nursing Facility | Attending: Internal Medicine | Admitting: Internal Medicine

## 2018-07-26 DIAGNOSIS — F418 Other specified anxiety disorders: Secondary | ICD-10-CM | POA: Insufficient documentation

## 2018-07-26 DIAGNOSIS — I5042 Chronic combined systolic (congestive) and diastolic (congestive) heart failure: Secondary | ICD-10-CM | POA: Insufficient documentation

## 2018-07-26 DIAGNOSIS — I4891 Unspecified atrial fibrillation: Secondary | ICD-10-CM | POA: Insufficient documentation

## 2018-07-27 ENCOUNTER — Encounter (HOSPITAL_COMMUNITY)
Admission: RE | Admit: 2018-07-27 | Discharge: 2018-07-27 | Disposition: A | Discharge status: Home / Self Care | Payer: Medicare Other | Source: Skilled Nursing Facility | Attending: Internal Medicine | Admitting: Internal Medicine

## 2018-07-27 DIAGNOSIS — F418 Other specified anxiety disorders: Secondary | ICD-10-CM | POA: Insufficient documentation

## 2018-07-27 DIAGNOSIS — I5042 Chronic combined systolic (congestive) and diastolic (congestive) heart failure: Secondary | ICD-10-CM | POA: Insufficient documentation

## 2018-07-27 DIAGNOSIS — I4891 Unspecified atrial fibrillation: Secondary | ICD-10-CM | POA: Diagnosis not present

## 2018-07-27 DIAGNOSIS — F0390 Unspecified dementia without behavioral disturbance: Secondary | ICD-10-CM | POA: Insufficient documentation

## 2018-07-27 LAB — CBC WITH DIFFERENTIAL/PLATELET
Abs Immature Granulocytes: 0.01 10*3/uL (ref 0.00–0.07)
BASOS ABS: 0 10*3/uL (ref 0.0–0.1)
BASOS PCT: 1 %
EOS ABS: 0.3 10*3/uL (ref 0.0–0.5)
Eosinophils Relative: 6 %
HCT: 39.6 % (ref 36.0–46.0)
Hemoglobin: 12.9 g/dL (ref 12.0–15.0)
IMMATURE GRANULOCYTES: 0 %
LYMPHS ABS: 1.4 10*3/uL (ref 0.7–4.0)
Lymphocytes Relative: 27 %
MCH: 33 pg (ref 26.0–34.0)
MCHC: 32.6 g/dL (ref 30.0–36.0)
MCV: 101.3 fL — ABNORMAL HIGH (ref 80.0–100.0)
MONO ABS: 0.6 10*3/uL (ref 0.1–1.0)
MONOS PCT: 13 %
NEUTROS PCT: 53 %
Neutro Abs: 2.7 10*3/uL (ref 1.7–7.7)
PLATELETS: 166 10*3/uL (ref 150–400)
RBC: 3.91 MIL/uL (ref 3.87–5.11)
RDW: 13.1 % (ref 11.5–15.5)
WBC: 5 10*3/uL (ref 4.0–10.5)
nRBC: 0 % (ref 0.0–0.2)

## 2018-07-27 LAB — BASIC METABOLIC PANEL
Anion gap: 8 (ref 5–15)
BUN: 24 mg/dL — AB (ref 8–23)
CALCIUM: 8.5 mg/dL — AB (ref 8.9–10.3)
CO2: 29 mmol/L (ref 22–32)
CREATININE: 0.82 mg/dL (ref 0.44–1.00)
Chloride: 103 mmol/L (ref 98–111)
GFR calc non Af Amer: 58 mL/min — ABNORMAL LOW (ref >60)
Glucose, Bld: 99 mg/dL (ref 70–99)
Potassium: 3.5 mmol/L (ref 3.5–5.1)
Sodium: 140 mmol/L (ref 135–145)

## 2018-07-27 LAB — TSH: TSH: 4.24 u[IU]/mL (ref 0.350–4.500)

## 2018-08-03 ENCOUNTER — Encounter: Payer: Self-pay | Admitting: Internal Medicine

## 2018-08-03 ENCOUNTER — Non-Acute Institutional Stay (SKILLED_NURSING_FACILITY): Payer: Medicare Other | Admitting: Internal Medicine

## 2018-08-03 DIAGNOSIS — E038 Other specified hypothyroidism: Secondary | ICD-10-CM

## 2018-08-03 DIAGNOSIS — I10 Essential (primary) hypertension: Secondary | ICD-10-CM | POA: Diagnosis not present

## 2018-08-03 DIAGNOSIS — I4891 Unspecified atrial fibrillation: Secondary | ICD-10-CM | POA: Diagnosis not present

## 2018-08-03 DIAGNOSIS — I5042 Chronic combined systolic (congestive) and diastolic (congestive) heart failure: Secondary | ICD-10-CM | POA: Diagnosis not present
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
## 2018-08-29 ENCOUNTER — Encounter: Payer: Self-pay | Admitting: Internal Medicine

## 2018-08-29 ENCOUNTER — Non-Acute Institutional Stay (SKILLED_NURSING_FACILITY): Payer: Medicare Other | Admitting: Internal Medicine

## 2018-08-29 DIAGNOSIS — H109 Unspecified conjunctivitis: Secondary | ICD-10-CM

## 2018-08-29 DIAGNOSIS — G40909 Epilepsy, unspecified, not intractable, without status epilepticus: Secondary | ICD-10-CM

## 2018-08-29 DIAGNOSIS — F419 Anxiety disorder, unspecified: Secondary | ICD-10-CM | POA: Diagnosis not present

## 2018-08-29 DIAGNOSIS — I10 Essential (primary) hypertension: Secondary | ICD-10-CM

## 2018-08-29 DIAGNOSIS — I5042 Chronic combined systolic (congestive) and diastolic (congestive) heart failure: Secondary | ICD-10-CM | POA: Diagnosis not present

## 2018-08-29 DIAGNOSIS — I4891 Unspecified atrial fibrillation: Secondary | ICD-10-CM

## 2018-08-29 DIAGNOSIS — F329 Major depressive disorder, single episode, unspecified: Secondary | ICD-10-CM | POA: Diagnosis not present

## 2018-08-29 DIAGNOSIS — S7292XS Unspecified fracture of left femur, sequela: Secondary | ICD-10-CM
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
## 2018-09-01 ENCOUNTER — Encounter (HOSPITAL_COMMUNITY)
Admission: RE | Admit: 2018-09-01 | Discharge: 2018-09-01 | Disposition: A | Discharge status: Home / Self Care | Payer: Medicare Other | Source: Skilled Nursing Facility | Attending: *Deleted | Admitting: *Deleted

## 2018-09-30 DIAGNOSIS — F0391 Unspecified dementia with behavioral disturbance: Secondary | ICD-10-CM | POA: Diagnosis not present

## 2018-09-30 DIAGNOSIS — F4322 Adjustment disorder with anxiety: Secondary | ICD-10-CM | POA: Diagnosis not present

## 2018-10-05 ENCOUNTER — Encounter: Payer: Self-pay | Admitting: Internal Medicine

## 2018-10-05 ENCOUNTER — Non-Acute Institutional Stay (SKILLED_NURSING_FACILITY): Payer: Medicare Other | Admitting: Internal Medicine

## 2018-10-05 DIAGNOSIS — I4891 Unspecified atrial fibrillation: Secondary | ICD-10-CM

## 2018-10-05 DIAGNOSIS — I10 Essential (primary) hypertension: Secondary | ICD-10-CM

## 2018-10-05 DIAGNOSIS — I5042 Chronic combined systolic (congestive) and diastolic (congestive) heart failure: Secondary | ICD-10-CM | POA: Diagnosis not present

## 2018-10-05 DIAGNOSIS — G40909 Epilepsy, unspecified, not intractable, without status epilepticus: Secondary | ICD-10-CM
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
## 2018-10-06 ENCOUNTER — Encounter (HOSPITAL_COMMUNITY)
Admission: RE | Admit: 2018-10-06 | Discharge: 2018-10-06 | Disposition: A | Discharge status: Home / Self Care | Payer: Medicare Other | Source: Skilled Nursing Facility | Attending: Internal Medicine | Admitting: Internal Medicine

## 2018-10-06 DIAGNOSIS — D519 Vitamin B12 deficiency anemia, unspecified: Secondary | ICD-10-CM | POA: Insufficient documentation

## 2018-10-06 DIAGNOSIS — E559 Vitamin D deficiency, unspecified: Secondary | ICD-10-CM | POA: Insufficient documentation

## 2018-10-06 DIAGNOSIS — I4891 Unspecified atrial fibrillation: Secondary | ICD-10-CM | POA: Insufficient documentation

## 2018-10-06 DIAGNOSIS — F0390 Unspecified dementia without behavioral disturbance: Secondary | ICD-10-CM | POA: Insufficient documentation

## 2018-10-06 DIAGNOSIS — R945 Abnormal results of liver function studies: Secondary | ICD-10-CM | POA: Insufficient documentation

## 2018-10-06 DIAGNOSIS — I5042 Chronic combined systolic (congestive) and diastolic (congestive) heart failure: Secondary | ICD-10-CM | POA: Insufficient documentation

## 2018-10-09 ENCOUNTER — Encounter (HOSPITAL_COMMUNITY)
Admission: RE | Admit: 2018-10-09 | Discharge: 2018-10-09 | Disposition: A | Discharge status: Home / Self Care | Payer: Medicare Other | Source: Skilled Nursing Facility | Attending: *Deleted | Admitting: *Deleted

## 2018-10-09 DIAGNOSIS — I4891 Unspecified atrial fibrillation: Secondary | ICD-10-CM | POA: Diagnosis not present

## 2018-10-09 DIAGNOSIS — E559 Vitamin D deficiency, unspecified: Secondary | ICD-10-CM | POA: Diagnosis not present

## 2018-10-09 DIAGNOSIS — F0390 Unspecified dementia without behavioral disturbance: Secondary | ICD-10-CM | POA: Diagnosis not present

## 2018-10-09 DIAGNOSIS — R945 Abnormal results of liver function studies: Secondary | ICD-10-CM | POA: Diagnosis not present

## 2018-10-09 DIAGNOSIS — I5042 Chronic combined systolic (congestive) and diastolic (congestive) heart failure: Secondary | ICD-10-CM | POA: Diagnosis not present

## 2018-10-09 DIAGNOSIS — D519 Vitamin B12 deficiency anemia, unspecified: Secondary | ICD-10-CM | POA: Diagnosis not present

## 2018-10-09 LAB — HEPATIC FUNCTION PANEL
ALK PHOS: 98 U/L (ref 38–126)
ALT: 17 U/L (ref 0–44)
AST: 22 U/L (ref 15–41)
Albumin: 3 g/dL — ABNORMAL LOW (ref 3.5–5.0)
BILIRUBIN DIRECT: 0.1 mg/dL (ref 0.0–0.2)
BILIRUBIN INDIRECT: 0.4 mg/dL (ref 0.3–0.9)
Total Bilirubin: 0.5 mg/dL (ref 0.3–1.2)
Total Protein: 5.6 g/dL — ABNORMAL LOW (ref 6.5–8.1)

## 2018-10-09 LAB — PHENYTOIN LEVEL, TOTAL: PHENYTOIN LVL: 17.8 ug/mL (ref 10.0–20.0)

## 2018-10-09 LAB — VITAMIN B12: Vitamin B-12: 168 pg/mL — ABNORMAL LOW (ref 180–914)

## 2018-10-10 LAB — VITAMIN D 25 HYDROXY (VIT D DEFICIENCY, FRACTURES): Vit D, 25-Hydroxy: 33.4 ng/mL (ref 30.0–100.0)

## 2018-11-16 DIAGNOSIS — I5042 Chronic combined systolic (congestive) and diastolic (congestive) heart failure: Secondary | ICD-10-CM | POA: Diagnosis not present

## 2018-11-16 DIAGNOSIS — Z993 Dependence on wheelchair: Secondary | ICD-10-CM | POA: Diagnosis not present

## 2018-11-16 DIAGNOSIS — M2042 Other hammer toe(s) (acquired), left foot: Secondary | ICD-10-CM | POA: Diagnosis not present

## 2018-11-16 DIAGNOSIS — B351 Tinea unguium: Secondary | ICD-10-CM | POA: Diagnosis not present

## 2018-11-16 DIAGNOSIS — M2041 Other hammer toe(s) (acquired), right foot: Secondary | ICD-10-CM | POA: Diagnosis not present

## 2018-11-16 DIAGNOSIS — M6281 Muscle weakness (generalized): Secondary | ICD-10-CM | POA: Diagnosis not present

## 2018-11-16 DIAGNOSIS — L603 Nail dystrophy: Secondary | ICD-10-CM | POA: Diagnosis not present

## 2018-11-16 DIAGNOSIS — I739 Peripheral vascular disease, unspecified: Secondary | ICD-10-CM | POA: Diagnosis not present

## 2018-11-21 DIAGNOSIS — M6281 Muscle weakness (generalized): Secondary | ICD-10-CM | POA: Diagnosis not present

## 2018-11-21 DIAGNOSIS — I5042 Chronic combined systolic (congestive) and diastolic (congestive) heart failure: Secondary | ICD-10-CM | POA: Diagnosis not present

## 2018-11-21 DIAGNOSIS — Z993 Dependence on wheelchair: Secondary | ICD-10-CM | POA: Diagnosis not present

## 2018-11-27 DIAGNOSIS — Z993 Dependence on wheelchair: Secondary | ICD-10-CM | POA: Diagnosis not present

## 2018-11-27 DIAGNOSIS — I5042 Chronic combined systolic (congestive) and diastolic (congestive) heart failure: Secondary | ICD-10-CM | POA: Diagnosis not present

## 2018-11-27 DIAGNOSIS — M6281 Muscle weakness (generalized): Secondary | ICD-10-CM | POA: Diagnosis not present

## 2018-11-28 DIAGNOSIS — I5042 Chronic combined systolic (congestive) and diastolic (congestive) heart failure: Secondary | ICD-10-CM | POA: Diagnosis not present

## 2018-11-28 DIAGNOSIS — Z993 Dependence on wheelchair: Secondary | ICD-10-CM | POA: Diagnosis not present

## 2018-11-28 DIAGNOSIS — M6281 Muscle weakness (generalized): Secondary | ICD-10-CM | POA: Diagnosis not present

## 2018-11-29 ENCOUNTER — Non-Acute Institutional Stay (SKILLED_NURSING_FACILITY): Payer: Medicare Other | Admitting: Adult Health

## 2018-11-29 ENCOUNTER — Encounter: Payer: Self-pay | Admitting: Adult Health

## 2018-11-29 DIAGNOSIS — F419 Anxiety disorder, unspecified: Secondary | ICD-10-CM

## 2018-11-29 DIAGNOSIS — K5909 Other constipation: Secondary | ICD-10-CM | POA: Diagnosis not present

## 2018-11-29 DIAGNOSIS — I4891 Unspecified atrial fibrillation: Secondary | ICD-10-CM

## 2018-11-29 DIAGNOSIS — Z993 Dependence on wheelchair: Secondary | ICD-10-CM | POA: Diagnosis not present

## 2018-11-29 DIAGNOSIS — G40909 Epilepsy, unspecified, not intractable, without status epilepticus: Secondary | ICD-10-CM

## 2018-11-29 DIAGNOSIS — E441 Mild protein-calorie malnutrition: Secondary | ICD-10-CM | POA: Diagnosis not present

## 2018-11-29 DIAGNOSIS — M6281 Muscle weakness (generalized): Secondary | ICD-10-CM | POA: Diagnosis not present

## 2018-11-29 DIAGNOSIS — I272 Pulmonary hypertension, unspecified: Secondary | ICD-10-CM

## 2018-11-29 DIAGNOSIS — E038 Other specified hypothyroidism: Secondary | ICD-10-CM

## 2018-11-29 DIAGNOSIS — I5042 Chronic combined systolic (congestive) and diastolic (congestive) heart failure: Secondary | ICD-10-CM
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
## 2018-11-30 DIAGNOSIS — M6281 Muscle weakness (generalized): Secondary | ICD-10-CM | POA: Diagnosis not present

## 2018-11-30 DIAGNOSIS — I5042 Chronic combined systolic (congestive) and diastolic (congestive) heart failure: Secondary | ICD-10-CM | POA: Diagnosis not present

## 2018-11-30 DIAGNOSIS — Z993 Dependence on wheelchair: Secondary | ICD-10-CM | POA: Diagnosis not present

## 2018-12-01 DIAGNOSIS — I5042 Chronic combined systolic (congestive) and diastolic (congestive) heart failure: Secondary | ICD-10-CM | POA: Diagnosis not present

## 2018-12-01 DIAGNOSIS — Z993 Dependence on wheelchair: Secondary | ICD-10-CM | POA: Diagnosis not present

## 2018-12-01 DIAGNOSIS — M6281 Muscle weakness (generalized): Secondary | ICD-10-CM | POA: Diagnosis not present

## 2018-12-04 DIAGNOSIS — M6281 Muscle weakness (generalized): Secondary | ICD-10-CM | POA: Diagnosis not present

## 2018-12-04 DIAGNOSIS — Z993 Dependence on wheelchair: Secondary | ICD-10-CM | POA: Diagnosis not present

## 2018-12-04 DIAGNOSIS — I5042 Chronic combined systolic (congestive) and diastolic (congestive) heart failure: Secondary | ICD-10-CM | POA: Diagnosis not present

## 2018-12-05 DIAGNOSIS — I5042 Chronic combined systolic (congestive) and diastolic (congestive) heart failure: Secondary | ICD-10-CM | POA: Diagnosis not present

## 2018-12-05 DIAGNOSIS — Z993 Dependence on wheelchair: Secondary | ICD-10-CM | POA: Diagnosis not present

## 2018-12-05 DIAGNOSIS — M6281 Muscle weakness (generalized): Secondary | ICD-10-CM | POA: Diagnosis not present

## 2018-12-06 DIAGNOSIS — M6281 Muscle weakness (generalized): Secondary | ICD-10-CM | POA: Diagnosis not present

## 2018-12-06 DIAGNOSIS — Z993 Dependence on wheelchair: Secondary | ICD-10-CM | POA: Diagnosis not present

## 2018-12-06 DIAGNOSIS — I5042 Chronic combined systolic (congestive) and diastolic (congestive) heart failure: Secondary | ICD-10-CM | POA: Diagnosis not present

## 2018-12-07 DIAGNOSIS — I5042 Chronic combined systolic (congestive) and diastolic (congestive) heart failure: Secondary | ICD-10-CM | POA: Diagnosis not present

## 2018-12-07 DIAGNOSIS — K5909 Other constipation: Secondary | ICD-10-CM | POA: Insufficient documentation

## 2018-12-07 DIAGNOSIS — Z993 Dependence on wheelchair: Secondary | ICD-10-CM | POA: Diagnosis not present

## 2018-12-07 DIAGNOSIS — F419 Anxiety disorder, unspecified: Secondary | ICD-10-CM | POA: Insufficient documentation

## 2018-12-07 DIAGNOSIS — M6281 Muscle weakness (generalized): Secondary | ICD-10-CM | POA: Diagnosis not present

## 2018-12-07 DIAGNOSIS — E441 Mild protein-calorie malnutrition: Secondary | ICD-10-CM | POA: Insufficient documentation

## 2018-12-08 DIAGNOSIS — M6281 Muscle weakness (generalized): Secondary | ICD-10-CM | POA: Diagnosis not present

## 2018-12-08 DIAGNOSIS — Z993 Dependence on wheelchair: Secondary | ICD-10-CM | POA: Diagnosis not present

## 2018-12-08 DIAGNOSIS — I5042 Chronic combined systolic (congestive) and diastolic (congestive) heart failure: Secondary | ICD-10-CM | POA: Diagnosis not present

## 2018-12-13 ENCOUNTER — Non-Acute Institutional Stay (SKILLED_NURSING_FACILITY): Payer: Medicare Other | Admitting: Adult Health

## 2018-12-13 ENCOUNTER — Encounter: Payer: Self-pay | Admitting: Adult Health

## 2018-12-13 DIAGNOSIS — I4891 Unspecified atrial fibrillation: Secondary | ICD-10-CM | POA: Diagnosis not present

## 2018-12-13 DIAGNOSIS — I38 Endocarditis, valve unspecified: Secondary | ICD-10-CM | POA: Diagnosis not present

## 2018-12-13 DIAGNOSIS — I272 Pulmonary hypertension, unspecified: Secondary | ICD-10-CM

## 2018-12-13 DIAGNOSIS — I5042 Chronic combined systolic (congestive) and diastolic (congestive) heart failure: Secondary | ICD-10-CM | POA: Diagnosis not present
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
## 2018-12-28 ENCOUNTER — Encounter: Payer: Self-pay | Admitting: Internal Medicine

## 2018-12-28 NOTE — Progress Notes (Signed)
Entered in error
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
## 2019-01-02 ENCOUNTER — Non-Acute Institutional Stay (SKILLED_NURSING_FACILITY): Payer: Medicare Other | Admitting: Internal Medicine

## 2019-01-02 ENCOUNTER — Encounter: Payer: Self-pay | Admitting: Internal Medicine

## 2019-01-02 DIAGNOSIS — I4891 Unspecified atrial fibrillation: Secondary | ICD-10-CM

## 2019-01-02 DIAGNOSIS — I10 Essential (primary) hypertension: Secondary | ICD-10-CM

## 2019-01-02 DIAGNOSIS — E038 Other specified hypothyroidism: Secondary | ICD-10-CM

## 2019-01-02 DIAGNOSIS — E538 Deficiency of other specified B group vitamins: Secondary | ICD-10-CM | POA: Diagnosis not present

## 2019-01-02 DIAGNOSIS — I5042 Chronic combined systolic (congestive) and diastolic (congestive) heart failure: Secondary | ICD-10-CM | POA: Diagnosis not present
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
## 2019-01-07 DIAGNOSIS — E538 Deficiency of other specified B group vitamins: Secondary | ICD-10-CM | POA: Insufficient documentation

## 2019-01-10 ENCOUNTER — Encounter (HOSPITAL_COMMUNITY)
Admission: RE | Admit: 2019-01-10 | Discharge: 2019-01-10 | Disposition: A | Discharge status: Home / Self Care | Payer: Medicare Other | Source: Skilled Nursing Facility | Attending: Internal Medicine | Admitting: Internal Medicine

## 2019-01-10 DIAGNOSIS — Z993 Dependence on wheelchair: Secondary | ICD-10-CM | POA: Diagnosis not present

## 2019-01-10 DIAGNOSIS — F0390 Unspecified dementia without behavioral disturbance: Secondary | ICD-10-CM | POA: Insufficient documentation

## 2019-01-10 DIAGNOSIS — E039 Hypothyroidism, unspecified: Secondary | ICD-10-CM | POA: Diagnosis not present

## 2019-01-10 DIAGNOSIS — I4891 Unspecified atrial fibrillation: Secondary | ICD-10-CM | POA: Diagnosis not present

## 2019-01-10 DIAGNOSIS — I5042 Chronic combined systolic (congestive) and diastolic (congestive) heart failure: Secondary | ICD-10-CM | POA: Diagnosis not present

## 2019-01-10 LAB — CBC WITH DIFFERENTIAL/PLATELET
Abs Immature Granulocytes: 0.04 10*3/uL (ref 0.00–0.07)
Basophils Absolute: 0 10*3/uL (ref 0.0–0.1)
Basophils Relative: 1 %
Eosinophils Absolute: 0.3 10*3/uL (ref 0.0–0.5)
Eosinophils Relative: 5 %
HCT: 42.3 % (ref 36.0–46.0)
Hemoglobin: 13.8 g/dL (ref 12.0–15.0)
Immature Granulocytes: 1 %
Lymphocytes Relative: 26 %
Lymphs Abs: 1.7 10*3/uL (ref 0.7–4.0)
MCH: 31.8 pg (ref 26.0–34.0)
MCHC: 32.6 g/dL (ref 30.0–36.0)
MCV: 97.5 fL (ref 80.0–100.0)
Monocytes Absolute: 0.5 10*3/uL (ref 0.1–1.0)
Monocytes Relative: 8 %
Neutro Abs: 4 10*3/uL (ref 1.7–7.7)
Neutrophils Relative %: 59 %
Platelets: 177 10*3/uL (ref 150–400)
RBC: 4.34 MIL/uL (ref 3.87–5.11)
RDW: 13.2 % (ref 11.5–15.5)
WBC: 6.5 10*3/uL (ref 4.0–10.5)
nRBC: 0 % (ref 0.0–0.2)

## 2019-01-10 LAB — BASIC METABOLIC PANEL
Anion gap: 9 (ref 5–15)
BUN: 18 mg/dL (ref 8–23)
CO2: 28 mmol/L (ref 22–32)
Calcium: 8.6 mg/dL — ABNORMAL LOW (ref 8.9–10.3)
Chloride: 103 mmol/L (ref 98–111)
Creatinine, Ser: 0.73 mg/dL (ref 0.44–1.00)
GFR calc Af Amer: >60 mL/min (ref >60)
GFR calc non Af Amer: >60 mL/min (ref >60)
Glucose, Bld: 95 mg/dL (ref 70–99)
Potassium: 4 mmol/L (ref 3.5–5.1)
Sodium: 140 mmol/L (ref 135–145)

## 2019-01-10 LAB — TSH: TSH: 1.56 u[IU]/mL (ref 0.350–4.500)

## 2019-01-12 DIAGNOSIS — F0391 Unspecified dementia with behavioral disturbance: Secondary | ICD-10-CM | POA: Diagnosis not present

## 2019-01-12 DIAGNOSIS — F4322 Adjustment disorder with anxiety: Secondary | ICD-10-CM | POA: Diagnosis not present

## 2019-02-01 ENCOUNTER — Non-Acute Institutional Stay (SKILLED_NURSING_FACILITY): Payer: Medicare Other | Admitting: Adult Health

## 2019-02-01 ENCOUNTER — Encounter: Payer: Self-pay | Admitting: Adult Health

## 2019-02-01 DIAGNOSIS — K5909 Other constipation: Secondary | ICD-10-CM | POA: Diagnosis not present

## 2019-02-01 DIAGNOSIS — G40909 Epilepsy, unspecified, not intractable, without status epilepticus: Secondary | ICD-10-CM | POA: Diagnosis not present

## 2019-02-01 DIAGNOSIS — E038 Other specified hypothyroidism: Secondary | ICD-10-CM
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
## 2019-02-09 DIAGNOSIS — F0391 Unspecified dementia with behavioral disturbance: Secondary | ICD-10-CM | POA: Diagnosis not present

## 2019-02-09 DIAGNOSIS — F4322 Adjustment disorder with anxiety: Secondary | ICD-10-CM | POA: Diagnosis not present

## 2019-02-28 ENCOUNTER — Encounter: Payer: Self-pay | Admitting: Adult Health

## 2019-02-28 ENCOUNTER — Non-Acute Institutional Stay (SKILLED_NURSING_FACILITY): Payer: Medicare Other | Admitting: Adult Health

## 2019-02-28 DIAGNOSIS — I4891 Unspecified atrial fibrillation: Secondary | ICD-10-CM

## 2019-02-28 DIAGNOSIS — I272 Pulmonary hypertension, unspecified: Secondary | ICD-10-CM | POA: Diagnosis not present

## 2019-02-28 DIAGNOSIS — I5042 Chronic combined systolic (congestive) and diastolic (congestive) heart failure: Secondary | ICD-10-CM | POA: Diagnosis not present
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
## 2019-03-15 DIAGNOSIS — F4322 Adjustment disorder with anxiety: Secondary | ICD-10-CM | POA: Diagnosis not present

## 2019-03-15 DIAGNOSIS — F0391 Unspecified dementia with behavioral disturbance: Secondary | ICD-10-CM | POA: Diagnosis not present

## 2019-03-16 DIAGNOSIS — F0391 Unspecified dementia with behavioral disturbance: Secondary | ICD-10-CM | POA: Diagnosis not present

## 2019-03-16 DIAGNOSIS — F4322 Adjustment disorder with anxiety: Secondary | ICD-10-CM | POA: Diagnosis not present

## 2019-03-20 DIAGNOSIS — Z1159 Encounter for screening for other viral diseases: Secondary | ICD-10-CM | POA: Diagnosis not present

## 2019-03-20 DIAGNOSIS — I4891 Unspecified atrial fibrillation: Secondary | ICD-10-CM | POA: Diagnosis not present

## 2019-03-20 DIAGNOSIS — I5042 Chronic combined systolic (congestive) and diastolic (congestive) heart failure: Secondary | ICD-10-CM | POA: Diagnosis not present

## 2019-03-21 ENCOUNTER — Encounter (HOSPITAL_COMMUNITY)
Admission: RE | Admit: 2019-03-21 | Discharge: 2019-03-21 | Disposition: A | Discharge status: Home / Self Care | Payer: Medicare Other | Source: Skilled Nursing Facility | Attending: Adult Health | Admitting: Adult Health

## 2019-03-21 DIAGNOSIS — Z993 Dependence on wheelchair: Secondary | ICD-10-CM | POA: Insufficient documentation

## 2019-03-21 DIAGNOSIS — I5042 Chronic combined systolic (congestive) and diastolic (congestive) heart failure: Secondary | ICD-10-CM | POA: Insufficient documentation

## 2019-03-21 DIAGNOSIS — I4891 Unspecified atrial fibrillation: Secondary | ICD-10-CM | POA: Insufficient documentation

## 2019-03-21 DIAGNOSIS — F0390 Unspecified dementia without behavioral disturbance: Secondary | ICD-10-CM | POA: Insufficient documentation

## 2019-03-26 ENCOUNTER — Encounter (HOSPITAL_COMMUNITY)
Admission: RE | Admit: 2019-03-26 | Discharge: 2019-03-26 | Disposition: A | Discharge status: Home / Self Care | Payer: Medicare Other | Source: Skilled Nursing Facility | Attending: *Deleted | Admitting: *Deleted

## 2019-03-26 ENCOUNTER — Encounter: Payer: Self-pay | Admitting: Internal Medicine

## 2019-03-26 DIAGNOSIS — F0390 Unspecified dementia without behavioral disturbance: Secondary | ICD-10-CM | POA: Diagnosis not present

## 2019-03-26 DIAGNOSIS — Z993 Dependence on wheelchair: Secondary | ICD-10-CM | POA: Diagnosis not present

## 2019-03-26 DIAGNOSIS — I5042 Chronic combined systolic (congestive) and diastolic (congestive) heart failure: Secondary | ICD-10-CM | POA: Diagnosis not present

## 2019-03-26 DIAGNOSIS — I4891 Unspecified atrial fibrillation: Secondary | ICD-10-CM | POA: Diagnosis not present

## 2019-03-26 LAB — PHENYTOIN LEVEL, TOTAL: Phenytoin Lvl: 20.3 ug/mL — ABNORMAL HIGH (ref 10.0–20.0)
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
## 2019-03-26 NOTE — Progress Notes (Signed)
Entered in error

## 2019-03-30 ENCOUNTER — Encounter: Payer: Self-pay | Admitting: Adult Health

## 2019-03-30 ENCOUNTER — Non-Acute Institutional Stay (SKILLED_NURSING_FACILITY): Payer: Medicare Other | Admitting: Adult Health

## 2019-03-30 DIAGNOSIS — K5909 Other constipation: Secondary | ICD-10-CM

## 2019-03-30 DIAGNOSIS — F419 Anxiety disorder, unspecified: Secondary | ICD-10-CM

## 2019-03-30 DIAGNOSIS — E441 Mild protein-calorie malnutrition: Secondary | ICD-10-CM | POA: Diagnosis not present
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
## 2019-04-03 ENCOUNTER — Other Ambulatory Visit (HOSPITAL_COMMUNITY)
Admission: RE | Admit: 2019-04-03 | Discharge: 2019-04-03 | Disposition: A | Discharge status: Home / Self Care | Payer: Medicare Other | Source: Skilled Nursing Facility | Attending: Adult Health | Admitting: Adult Health

## 2019-04-03 ENCOUNTER — Encounter: Payer: Self-pay | Admitting: Adult Health

## 2019-04-03 ENCOUNTER — Non-Acute Institutional Stay (SKILLED_NURSING_FACILITY): Payer: Medicare Other | Admitting: Adult Health

## 2019-04-03 DIAGNOSIS — R569 Unspecified convulsions: Secondary | ICD-10-CM | POA: Insufficient documentation

## 2019-04-03 DIAGNOSIS — G40909 Epilepsy, unspecified, not intractable, without status epilepticus: Secondary | ICD-10-CM | POA: Diagnosis not present

## 2019-04-03 LAB — PHENYTOIN LEVEL, TOTAL: Phenytoin Lvl: 17.1 ug/mL (ref 10.0–20.0)

## 2019-04-03 LAB — ALBUMIN: Albumin: 3.3 g/dL — ABNORMAL LOW (ref 3.5–5.0)
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
## 2019-04-12 ENCOUNTER — Encounter (HOSPITAL_COMMUNITY)
Admission: RE | Admit: 2019-04-12 | Discharge: 2019-04-12 | Disposition: A | Discharge status: Home / Self Care | Payer: Medicare Other | Source: Ambulatory Visit | Attending: Adult Health | Admitting: Adult Health

## 2019-04-12 DIAGNOSIS — R569 Unspecified convulsions: Secondary | ICD-10-CM | POA: Insufficient documentation

## 2019-04-12 LAB — PHENYTOIN LEVEL, TOTAL: Phenytoin Lvl: 11.3 ug/mL (ref 10.0–20.0)

## 2019-04-27 ENCOUNTER — Non-Acute Institutional Stay (SKILLED_NURSING_FACILITY): Payer: Medicare Other | Admitting: Adult Health

## 2019-04-27 ENCOUNTER — Encounter: Payer: Self-pay | Admitting: Adult Health

## 2019-04-27 DIAGNOSIS — E038 Other specified hypothyroidism: Secondary | ICD-10-CM

## 2019-04-27 DIAGNOSIS — G40909 Epilepsy, unspecified, not intractable, without status epilepticus: Secondary | ICD-10-CM | POA: Diagnosis not present

## 2019-04-27 DIAGNOSIS — I272 Pulmonary hypertension, unspecified: Secondary | ICD-10-CM
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
## 2019-04-30 ENCOUNTER — Encounter: Payer: Self-pay | Admitting: Adult Health

## 2019-04-30 DIAGNOSIS — R1310 Dysphagia, unspecified: Secondary | ICD-10-CM | POA: Diagnosis not present

## 2019-04-30 DIAGNOSIS — F0390 Unspecified dementia without behavioral disturbance: Secondary | ICD-10-CM | POA: Diagnosis not present

## 2019-04-30 DIAGNOSIS — I5042 Chronic combined systolic (congestive) and diastolic (congestive) heart failure: Secondary | ICD-10-CM | POA: Diagnosis not present

## 2019-04-30 MED ORDER — PHENYTOIN 50 MG PO CHEW: 100.0000 mg | CHEWABLE_TABLET | Freq: Two times a day (BID) | ORAL | Status: DC

## 2019-05-07 ENCOUNTER — Non-Acute Institutional Stay (SKILLED_NURSING_FACILITY): Payer: Medicare Other | Admitting: Adult Health

## 2019-05-07 ENCOUNTER — Encounter: Payer: Self-pay | Admitting: Adult Health

## 2019-05-07 DIAGNOSIS — I110 Hypertensive heart disease with heart failure: Secondary | ICD-10-CM | POA: Diagnosis not present

## 2019-05-07 DIAGNOSIS — I5042 Chronic combined systolic (congestive) and diastolic (congestive) heart failure: Secondary | ICD-10-CM

## 2019-05-07 DIAGNOSIS — R634 Abnormal weight loss: Secondary | ICD-10-CM

## 2019-05-07 DIAGNOSIS — F419 Anxiety disorder, unspecified: Secondary | ICD-10-CM | POA: Diagnosis not present

## 2019-05-07 DIAGNOSIS — R451 Restlessness and agitation: Secondary | ICD-10-CM | POA: Diagnosis not present
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
## 2019-05-08 DIAGNOSIS — R634 Abnormal weight loss: Secondary | ICD-10-CM | POA: Insufficient documentation

## 2019-05-08 DIAGNOSIS — I110 Hypertensive heart disease with heart failure: Secondary | ICD-10-CM | POA: Insufficient documentation

## 2019-05-08 DIAGNOSIS — R451 Restlessness and agitation: Secondary | ICD-10-CM | POA: Insufficient documentation

## 2019-05-09 ENCOUNTER — Encounter: Payer: Self-pay | Admitting: Adult Health

## 2019-05-09 ENCOUNTER — Other Ambulatory Visit (HOSPITAL_COMMUNITY)
Admission: RE | Admit: 2019-05-09 | Discharge: 2019-05-09 | Disposition: A | Discharge status: Home / Self Care | Payer: Medicare Other | Source: Skilled Nursing Facility | Attending: Adult Health | Admitting: Adult Health

## 2019-05-09 ENCOUNTER — Non-Acute Institutional Stay (SKILLED_NURSING_FACILITY): Payer: Medicare Other | Admitting: Adult Health

## 2019-05-09 DIAGNOSIS — E876 Hypokalemia: Secondary | ICD-10-CM | POA: Diagnosis not present

## 2019-05-09 DIAGNOSIS — I110 Hypertensive heart disease with heart failure: Secondary | ICD-10-CM | POA: Insufficient documentation

## 2019-05-09 DIAGNOSIS — I5042 Chronic combined systolic (congestive) and diastolic (congestive) heart failure: Secondary | ICD-10-CM | POA: Diagnosis not present

## 2019-05-09 DIAGNOSIS — G40909 Epilepsy, unspecified, not intractable, without status epilepticus: Secondary | ICD-10-CM

## 2019-05-09 LAB — COMPREHENSIVE METABOLIC PANEL
ALT: 13 U/L (ref 0–44)
AST: 20 U/L (ref 15–41)
Albumin: 3.1 g/dL — ABNORMAL LOW (ref 3.5–5.0)
Alkaline Phosphatase: 95 U/L (ref 38–126)
Anion gap: 10 (ref 5–15)
BUN: 23 mg/dL (ref 8–23)
CO2: 29 mmol/L (ref 22–32)
Calcium: 8.3 mg/dL — ABNORMAL LOW (ref 8.9–10.3)
Chloride: 102 mmol/L (ref 98–111)
Creatinine, Ser: 0.65 mg/dL (ref 0.44–1.00)
GFR calc Af Amer: >60 mL/min (ref >60)
GFR calc non Af Amer: >60 mL/min (ref >60)
Glucose, Bld: 89 mg/dL (ref 70–99)
Potassium: 2.9 mmol/L — ABNORMAL LOW (ref 3.5–5.1)
Sodium: 141 mmol/L (ref 135–145)
Total Bilirubin: 0.8 mg/dL (ref 0.3–1.2)
Total Protein: 5.7 g/dL — ABNORMAL LOW (ref 6.5–8.1)

## 2019-05-09 LAB — CBC
HCT: 39.6 % (ref 36.0–46.0)
Hemoglobin: 13 g/dL (ref 12.0–15.0)
MCH: 32.5 pg (ref 26.0–34.0)
MCHC: 32.8 g/dL (ref 30.0–36.0)
MCV: 99 fL (ref 80.0–100.0)
Platelets: 156 10*3/uL (ref 150–400)
RBC: 4 MIL/uL (ref 3.87–5.11)
RDW: 13.4 % (ref 11.5–15.5)
WBC: 6.2 10*3/uL (ref 4.0–10.5)
nRBC: 0 % (ref 0.0–0.2)

## 2019-05-09 LAB — TSH: TSH: 0.689 u[IU]/mL (ref 0.350–4.500)

## 2019-05-09 LAB — PHENYTOIN LEVEL, TOTAL: Phenytoin Lvl: 5.6 ug/mL — ABNORMAL LOW (ref 10.0–20.0)
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
## 2019-05-10 ENCOUNTER — Other Ambulatory Visit (HOSPITAL_COMMUNITY)
Admission: RE | Admit: 2019-05-10 | Discharge: 2019-05-10 | Disposition: A | Discharge status: Home / Self Care | Payer: Medicare Other | Source: Skilled Nursing Facility | Attending: Adult Health | Admitting: Adult Health

## 2019-05-10 DIAGNOSIS — E876 Hypokalemia: Secondary | ICD-10-CM | POA: Diagnosis not present

## 2019-05-10 LAB — POTASSIUM: Potassium: 3.8 mmol/L (ref 3.5–5.1)

## 2019-05-11 ENCOUNTER — Other Ambulatory Visit (HOSPITAL_COMMUNITY)
Admission: RE | Admit: 2019-05-11 | Discharge: 2019-05-11 | Disposition: A | Discharge status: Home / Self Care | Payer: Medicare Other | Source: Skilled Nursing Facility | Attending: Adult Health | Admitting: Adult Health

## 2019-05-11 DIAGNOSIS — E876 Hypokalemia: Secondary | ICD-10-CM | POA: Diagnosis not present

## 2019-05-11 LAB — POTASSIUM: Potassium: 4.1 mmol/L (ref 3.5–5.1)

## 2019-05-15 DIAGNOSIS — Z20828 Contact with and (suspected) exposure to other viral communicable diseases: Secondary | ICD-10-CM | POA: Diagnosis not present

## 2019-05-17 ENCOUNTER — Encounter: Payer: Self-pay | Admitting: Adult Health

## 2019-05-17 ENCOUNTER — Non-Acute Institutional Stay (SKILLED_NURSING_FACILITY): Payer: Medicare Other | Admitting: Adult Health

## 2019-05-17 ENCOUNTER — Other Ambulatory Visit (HOSPITAL_COMMUNITY)
Admission: RE | Admit: 2019-05-17 | Discharge: 2019-05-17 | Disposition: A | Discharge status: Home / Self Care | Payer: Medicare Other | Source: Ambulatory Visit | Attending: Adult Health | Admitting: Adult Health

## 2019-05-17 DIAGNOSIS — G40909 Epilepsy, unspecified, not intractable, without status epilepticus: Secondary | ICD-10-CM

## 2019-05-17 DIAGNOSIS — R569 Unspecified convulsions: Secondary | ICD-10-CM | POA: Diagnosis not present

## 2019-05-17 LAB — PHENYTOIN LEVEL, TOTAL: Phenytoin Lvl: 5.5 ug/mL — ABNORMAL LOW (ref 10.0–20.0)
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
## 2019-05-24 ENCOUNTER — Encounter (HOSPITAL_COMMUNITY)
Admission: RE | Admit: 2019-05-24 | Discharge: 2019-05-24 | Disposition: A | Discharge status: Home / Self Care | Payer: Medicare Other | Source: Skilled Nursing Facility | Attending: Adult Health | Admitting: Adult Health

## 2019-05-24 DIAGNOSIS — E876 Hypokalemia: Secondary | ICD-10-CM | POA: Diagnosis not present

## 2019-05-24 DIAGNOSIS — I4891 Unspecified atrial fibrillation: Secondary | ICD-10-CM | POA: Insufficient documentation

## 2019-05-24 DIAGNOSIS — Z993 Dependence on wheelchair: Secondary | ICD-10-CM | POA: Diagnosis not present

## 2019-05-24 DIAGNOSIS — I5042 Chronic combined systolic (congestive) and diastolic (congestive) heart failure: Secondary | ICD-10-CM | POA: Insufficient documentation

## 2019-05-24 DIAGNOSIS — F0390 Unspecified dementia without behavioral disturbance: Secondary | ICD-10-CM | POA: Diagnosis not present

## 2019-05-24 LAB — PHENYTOIN LEVEL, TOTAL: Phenytoin Lvl: 9.3 ug/mL — ABNORMAL LOW (ref 10.0–20.0)

## 2019-05-31 ENCOUNTER — Non-Acute Institutional Stay (SKILLED_NURSING_FACILITY): Payer: Medicare Other | Admitting: Adult Health

## 2019-05-31 ENCOUNTER — Encounter: Payer: Self-pay | Admitting: Adult Health

## 2019-05-31 DIAGNOSIS — G40909 Epilepsy, unspecified, not intractable, without status epilepticus: Secondary | ICD-10-CM | POA: Diagnosis not present

## 2019-05-31 DIAGNOSIS — I38 Endocarditis, valve unspecified: Secondary | ICD-10-CM | POA: Diagnosis not present

## 2019-05-31 DIAGNOSIS — E441 Mild protein-calorie malnutrition: Secondary | ICD-10-CM

## 2019-05-31 DIAGNOSIS — E038 Other specified hypothyroidism: Secondary | ICD-10-CM | POA: Diagnosis not present

## 2019-05-31 DIAGNOSIS — I272 Pulmonary hypertension, unspecified: Secondary | ICD-10-CM

## 2019-05-31 DIAGNOSIS — I5042 Chronic combined systolic (congestive) and diastolic (congestive) heart failure: Secondary | ICD-10-CM | POA: Diagnosis not present

## 2019-05-31 DIAGNOSIS — I110 Hypertensive heart disease with heart failure: Secondary | ICD-10-CM

## 2019-05-31 DIAGNOSIS — E876 Hypokalemia: Secondary | ICD-10-CM | POA: Diagnosis not present

## 2019-05-31 DIAGNOSIS — K5909 Other constipation: Secondary | ICD-10-CM | POA: Diagnosis not present
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
## 2019-06-01 DIAGNOSIS — F4322 Adjustment disorder with anxiety: Secondary | ICD-10-CM | POA: Diagnosis not present

## 2019-06-01 DIAGNOSIS — F0391 Unspecified dementia with behavioral disturbance: Secondary | ICD-10-CM | POA: Diagnosis not present

## 2019-06-03 DIAGNOSIS — I5042 Chronic combined systolic (congestive) and diastolic (congestive) heart failure: Secondary | ICD-10-CM | POA: Insufficient documentation

## 2019-06-06 ENCOUNTER — Encounter: Payer: Self-pay | Admitting: Adult Health

## 2019-06-06 ENCOUNTER — Non-Acute Institutional Stay (SKILLED_NURSING_FACILITY): Payer: Medicare Other | Admitting: Adult Health

## 2019-06-06 DIAGNOSIS — F01518 Vascular dementia, unspecified severity, with other behavioral disturbance: Secondary | ICD-10-CM

## 2019-06-06 DIAGNOSIS — R451 Restlessness and agitation: Secondary | ICD-10-CM

## 2019-06-06 DIAGNOSIS — F0151 Vascular dementia with behavioral disturbance: Secondary | ICD-10-CM | POA: Diagnosis not present

## 2019-06-06 DIAGNOSIS — G40909 Epilepsy, unspecified, not intractable, without status epilepticus: Secondary | ICD-10-CM
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
## 2019-06-08 DIAGNOSIS — F01518 Vascular dementia, unspecified severity, with other behavioral disturbance: Secondary | ICD-10-CM | POA: Insufficient documentation

## 2019-06-08 DIAGNOSIS — F0151 Vascular dementia with behavioral disturbance: Secondary | ICD-10-CM | POA: Insufficient documentation

## 2019-06-30 ENCOUNTER — Emergency Department (HOSPITAL_COMMUNITY)
Admission: EM | Admit: 2019-06-30 | Discharge: 2019-06-30 | Disposition: A | Discharge status: Home / Self Care | Payer: Medicare Other | Attending: Emergency Medicine | Admitting: Emergency Medicine

## 2019-06-30 ENCOUNTER — Other Ambulatory Visit: Payer: Self-pay

## 2019-06-30 ENCOUNTER — Inpatient Hospital Stay
Admission: RE | Admit: 2019-06-30 | Discharge: 2020-08-11 | Disposition: E | Payer: Medicare Other | Source: Ambulatory Visit | Attending: Internal Medicine | Admitting: Internal Medicine

## 2019-06-30 ENCOUNTER — Encounter (HOSPITAL_COMMUNITY): Payer: Self-pay | Admitting: Emergency Medicine

## 2019-06-30 DIAGNOSIS — I5042 Chronic combined systolic (congestive) and diastolic (congestive) heart failure: Secondary | ICD-10-CM | POA: Insufficient documentation

## 2019-06-30 DIAGNOSIS — S81811A Laceration without foreign body, right lower leg, initial encounter: Secondary | ICD-10-CM

## 2019-06-30 DIAGNOSIS — W1812XA Fall from or off toilet with subsequent striking against object, initial encounter: Secondary | ICD-10-CM | POA: Insufficient documentation

## 2019-06-30 DIAGNOSIS — Z96653 Presence of artificial knee joint, bilateral: Secondary | ICD-10-CM | POA: Insufficient documentation

## 2019-06-30 DIAGNOSIS — Y92129 Unspecified place in nursing home as the place of occurrence of the external cause: Secondary | ICD-10-CM | POA: Insufficient documentation

## 2019-06-30 DIAGNOSIS — Y9389 Activity, other specified: Secondary | ICD-10-CM | POA: Insufficient documentation

## 2019-06-30 DIAGNOSIS — Y999 Unspecified external cause status: Secondary | ICD-10-CM | POA: Insufficient documentation

## 2019-06-30 DIAGNOSIS — Z96641 Presence of right artificial hip joint: Secondary | ICD-10-CM | POA: Diagnosis not present

## 2019-06-30 DIAGNOSIS — I110 Hypertensive heart disease with heart failure: Secondary | ICD-10-CM | POA: Diagnosis not present

## 2019-06-30 DIAGNOSIS — E039 Hypothyroidism, unspecified: Secondary | ICD-10-CM | POA: Insufficient documentation

## 2019-06-30 DIAGNOSIS — Z79899 Other long term (current) drug therapy: Secondary | ICD-10-CM | POA: Insufficient documentation

## 2019-06-30 DIAGNOSIS — Z85828 Personal history of other malignant neoplasm of skin: Secondary | ICD-10-CM | POA: Insufficient documentation

## 2019-06-30 MED ORDER — TETANUS-DIPHTH-ACELL PERTUSSIS 5-2.5-18.5 LF-MCG/0.5 IM SUSP
0.5000 mL | Freq: Once | INTRAMUSCULAR | Status: AC
  Administered 2019-06-30: 0.5 mL via INTRAMUSCULAR
  Filled 2019-06-30: qty 0.5

## 2019-06-30 NOTE — ED Triage Notes (Signed)
Sent from [place] for lac/skintear to R lower leg  Wound is a skin tear adjacent to shin It is jagged and appear deep in the middle of an 1.5 in lac  No caregiver with save to transport   Per transport, staff moving pt into wheelchair when injury occurred

## 2019-06-30 NOTE — ED Notes (Signed)
Awaiting eval  

## 2019-06-30 NOTE — ED Notes (Signed)
Report to [facility]  Pt moved without assist to [facility] to return to [facility]

## 2019-06-30 NOTE — ED Notes (Signed)
HB in to assess and repair

## 2019-06-30 NOTE — Discharge Instructions (Addendum)
Your laceration was repaired with Dermabond/glue and Steri-Strips.  These will come off in about 7 to 10 days.  Please allow them to come off on their own.  Please do not get the wound wet today, but you may get it gently wet tomorrow.  Please return to the emergency department if any red streaks going up your leg, or signs of advancing infection.
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
## 2019-06-30 NOTE — ED Notes (Signed)
Pt diaper changed of heavy, dried feces and urine  Pt cleansed with new diaper application after gentle cleaning of dried feces to skin   Redness noted to sacral area  Lower legs bilaterally are mottled and dark

## 2019-07-11 ENCOUNTER — Non-Acute Institutional Stay (SKILLED_NURSING_FACILITY): Payer: Medicare Other | Admitting: Adult Health

## 2019-07-11 ENCOUNTER — Encounter: Payer: Self-pay | Admitting: Adult Health

## 2019-07-11 DIAGNOSIS — I38 Endocarditis, valve unspecified: Secondary | ICD-10-CM

## 2019-07-11 DIAGNOSIS — I5042 Chronic combined systolic (congestive) and diastolic (congestive) heart failure: Secondary | ICD-10-CM

## 2019-07-11 DIAGNOSIS — F0151 Vascular dementia with behavioral disturbance: Secondary | ICD-10-CM

## 2019-07-11 DIAGNOSIS — F01518 Vascular dementia, unspecified severity, with other behavioral disturbance: Secondary | ICD-10-CM

## 2019-07-11 DIAGNOSIS — I4891 Unspecified atrial fibrillation: Secondary | ICD-10-CM | POA: Diagnosis not present
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
## 2019-07-12 DEATH — deceased

## 2019-08-01 ENCOUNTER — Other Ambulatory Visit: Payer: Self-pay | Admitting: Adult Health

## 2019-08-03 DIAGNOSIS — M79674 Pain in right toe(s): Secondary | ICD-10-CM | POA: Diagnosis not present

## 2019-08-03 DIAGNOSIS — M79675 Pain in left toe(s): Secondary | ICD-10-CM | POA: Diagnosis not present

## 2019-08-03 DIAGNOSIS — I739 Peripheral vascular disease, unspecified: Secondary | ICD-10-CM | POA: Diagnosis not present

## 2019-08-03 DIAGNOSIS — B351 Tinea unguium: Secondary | ICD-10-CM | POA: Diagnosis not present

## 2019-08-08 ENCOUNTER — Encounter: Payer: Self-pay | Admitting: Internal Medicine

## 2019-08-08 ENCOUNTER — Non-Acute Institutional Stay (SKILLED_NURSING_FACILITY): Payer: Medicare Other | Admitting: Internal Medicine

## 2019-08-08 DIAGNOSIS — I272 Pulmonary hypertension, unspecified: Secondary | ICD-10-CM | POA: Diagnosis not present

## 2019-08-08 DIAGNOSIS — I5042 Chronic combined systolic (congestive) and diastolic (congestive) heart failure: Secondary | ICD-10-CM

## 2019-08-08 DIAGNOSIS — I110 Hypertensive heart disease with heart failure: Secondary | ICD-10-CM
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
## 2019-08-12 ENCOUNTER — Encounter: Payer: Self-pay | Admitting: Internal Medicine

## 2019-08-12 NOTE — Assessment & Plan Note (Signed)
Controlled; continue spironolactone 25 mg twice daily and metoprolol XL 12.5 mg daily

## 2019-08-12 NOTE — Assessment & Plan Note (Signed)
No reported recent exacerbations; continue Toprol-XL 12.5 mg daily and spironolactone 25 mg twice daily

## 2019-08-12 NOTE — Assessment & Plan Note (Signed)
No reported problems; continue spironolactone 25 mg twice daily

## 2019-08-20 DIAGNOSIS — Z794 Long term (current) use of insulin: Secondary | ICD-10-CM | POA: Diagnosis not present

## 2019-08-20 DIAGNOSIS — Z961 Presence of intraocular lens: Secondary | ICD-10-CM | POA: Diagnosis not present

## 2019-08-20 DIAGNOSIS — E119 Type 2 diabetes mellitus without complications: Secondary | ICD-10-CM | POA: Diagnosis not present

## 2019-08-20 DIAGNOSIS — Z7901 Long term (current) use of anticoagulants: Secondary | ICD-10-CM | POA: Diagnosis not present

## 2019-09-03 ENCOUNTER — Non-Acute Institutional Stay: Payer: Self-pay | Admitting: Adult Health

## 2019-09-03 ENCOUNTER — Encounter: Payer: Self-pay | Admitting: Adult Health

## 2019-09-03 DIAGNOSIS — Z0000 Encounter for general adult medical examination without abnormal findings: Secondary | ICD-10-CM

## 2019-09-03 DIAGNOSIS — I5042 Chronic combined systolic (congestive) and diastolic (congestive) heart failure: Secondary | ICD-10-CM

## 2019-09-03 DIAGNOSIS — F0151 Vascular dementia with behavioral disturbance: Secondary | ICD-10-CM

## 2019-09-03 DIAGNOSIS — F01518 Vascular dementia, unspecified severity, with other behavioral disturbance: Secondary | ICD-10-CM

## 2019-09-03 DIAGNOSIS — G40909 Epilepsy, unspecified, not intractable, without status epilepticus: Secondary | ICD-10-CM
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
## 2019-09-04 ENCOUNTER — Encounter: Payer: Self-pay | Admitting: Adult Health

## 2019-09-04 ENCOUNTER — Non-Acute Institutional Stay (SKILLED_NURSING_FACILITY): Payer: Medicare Other | Admitting: Adult Health

## 2019-09-04 DIAGNOSIS — F419 Anxiety disorder, unspecified: Secondary | ICD-10-CM

## 2019-09-04 DIAGNOSIS — K5909 Other constipation: Secondary | ICD-10-CM

## 2019-09-04 DIAGNOSIS — E441 Mild protein-calorie malnutrition: Secondary | ICD-10-CM | POA: Diagnosis not present
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
## 2019-09-10 ENCOUNTER — Encounter: Payer: Self-pay | Admitting: Internal Medicine

## 2019-09-10 ENCOUNTER — Encounter (HOSPITAL_COMMUNITY)
Admission: RE | Admit: 2019-09-10 | Discharge: 2019-09-10 | Disposition: A | Discharge status: Home / Self Care | Payer: Medicare Other | Source: Skilled Nursing Facility | Attending: Adult Health | Admitting: Adult Health

## 2019-09-10 ENCOUNTER — Non-Acute Institutional Stay (SKILLED_NURSING_FACILITY): Payer: Medicare Other | Admitting: Internal Medicine

## 2019-09-10 DIAGNOSIS — E876 Hypokalemia: Secondary | ICD-10-CM

## 2019-09-10 DIAGNOSIS — I5042 Chronic combined systolic (congestive) and diastolic (congestive) heart failure: Secondary | ICD-10-CM | POA: Insufficient documentation

## 2019-09-10 DIAGNOSIS — I4891 Unspecified atrial fibrillation: Secondary | ICD-10-CM | POA: Diagnosis not present

## 2019-09-10 DIAGNOSIS — G40909 Epilepsy, unspecified, not intractable, without status epilepticus: Secondary | ICD-10-CM | POA: Diagnosis not present

## 2019-09-10 LAB — COMPREHENSIVE METABOLIC PANEL
ALT: 13 U/L (ref 0–44)
AST: 17 U/L (ref 15–41)
Albumin: 3.1 g/dL — ABNORMAL LOW (ref 3.5–5.0)
Alkaline Phosphatase: 102 U/L (ref 38–126)
Anion gap: 11 (ref 5–15)
BUN: 18 mg/dL (ref 8–23)
CO2: 25 mmol/L (ref 22–32)
Calcium: 8.5 mg/dL — ABNORMAL LOW (ref 8.9–10.3)
Chloride: 104 mmol/L (ref 98–111)
Creatinine, Ser: 0.63 mg/dL (ref 0.44–1.00)
GFR calc Af Amer: >60 mL/min (ref >60)
GFR calc non Af Amer: >60 mL/min (ref >60)
Glucose, Bld: 124 mg/dL — ABNORMAL HIGH (ref 70–99)
Potassium: 3.8 mmol/L (ref 3.5–5.1)
Sodium: 140 mmol/L (ref 135–145)
Total Bilirubin: 0.5 mg/dL (ref 0.3–1.2)
Total Protein: 5.8 g/dL — ABNORMAL LOW (ref 6.5–8.1)

## 2019-09-10 LAB — PHENYTOIN LEVEL, TOTAL: Phenytoin Lvl: 11 ug/mL (ref 10.0–20.0)
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
## 2019-09-13 ENCOUNTER — Encounter: Payer: Self-pay | Admitting: Internal Medicine

## 2019-09-16 ENCOUNTER — Other Ambulatory Visit: Payer: Self-pay | Admitting: Internal Medicine

## 2019-09-16 ENCOUNTER — Other Ambulatory Visit (HOSPITAL_COMMUNITY)
Admission: RE | Admit: 2019-09-16 | Discharge: 2019-09-16 | Disposition: A | Discharge status: Home / Self Care | Payer: Medicare Other | Source: Ambulatory Visit | Attending: Internal Medicine | Admitting: Internal Medicine

## 2019-09-16 DIAGNOSIS — Z20828 Contact with and (suspected) exposure to other viral communicable diseases: Secondary | ICD-10-CM | POA: Insufficient documentation

## 2019-09-16 DIAGNOSIS — Z9189 Other specified personal risk factors, not elsewhere classified: Secondary | ICD-10-CM

## 2019-09-21 LAB — SARS CORONAVIRUS 2 (TAT 6-24 HRS): SARS Coronavirus 2: NEGATIVE

## 2019-09-22 ENCOUNTER — Other Ambulatory Visit: Payer: Self-pay | Admitting: Internal Medicine

## 2019-09-22 ENCOUNTER — Other Ambulatory Visit (HOSPITAL_COMMUNITY)
Admission: RE | Admit: 2019-09-22 | Discharge: 2019-09-22 | Disposition: A | Discharge status: Home / Self Care | Payer: Medicare Other | Source: Ambulatory Visit | Attending: Internal Medicine | Admitting: Internal Medicine

## 2019-09-22 DIAGNOSIS — Z9189 Other specified personal risk factors, not elsewhere classified: Secondary | ICD-10-CM

## 2019-09-22 DIAGNOSIS — Z20828 Contact with and (suspected) exposure to other viral communicable diseases: Secondary | ICD-10-CM | POA: Diagnosis present

## 2019-09-22 LAB — SARS CORONAVIRUS 2 (TAT 6-24 HRS): SARS Coronavirus 2: NEGATIVE

## 2019-09-28 LAB — NOVEL CORONAVIRUS, NAA (HOSP ORDER, SEND-OUT TO REF LAB; TAT 18-24 HRS): SARS-CoV-2, NAA: NOT DETECTED

## 2019-10-02 ENCOUNTER — Non-Acute Institutional Stay (SKILLED_NURSING_FACILITY): Payer: Medicare Other | Admitting: Adult Health

## 2019-10-02 DIAGNOSIS — F0151 Vascular dementia with behavioral disturbance: Secondary | ICD-10-CM | POA: Diagnosis not present

## 2019-10-02 DIAGNOSIS — I5042 Chronic combined systolic (congestive) and diastolic (congestive) heart failure: Secondary | ICD-10-CM | POA: Diagnosis not present

## 2019-10-02 DIAGNOSIS — I110 Hypertensive heart disease with heart failure: Secondary | ICD-10-CM | POA: Diagnosis not present

## 2019-10-02 DIAGNOSIS — I38 Endocarditis, valve unspecified: Secondary | ICD-10-CM

## 2019-10-02 DIAGNOSIS — F01518 Vascular dementia, unspecified severity, with other behavioral disturbance: Secondary | ICD-10-CM

## 2019-10-03 ENCOUNTER — Encounter: Payer: Self-pay | Admitting: Internal Medicine

## 2019-10-03 ENCOUNTER — Non-Acute Institutional Stay (SKILLED_NURSING_FACILITY): Payer: Medicare Other | Admitting: Internal Medicine

## 2019-10-03 ENCOUNTER — Encounter: Payer: Self-pay | Admitting: Adult Health

## 2019-10-03 DIAGNOSIS — K5909 Other constipation: Secondary | ICD-10-CM

## 2019-10-03 DIAGNOSIS — G40909 Epilepsy, unspecified, not intractable, without status epilepticus: Secondary | ICD-10-CM | POA: Diagnosis not present

## 2019-10-03 DIAGNOSIS — E038 Other specified hypothyroidism: Secondary | ICD-10-CM

## 2019-10-03 LAB — NOVEL CORONAVIRUS, NAA (HOSP ORDER, SEND-OUT TO REF LAB; TAT 18-24 HRS): SARS-CoV-2, NAA: NOT DETECTED
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
## 2019-10-07 ENCOUNTER — Other Ambulatory Visit (HOSPITAL_COMMUNITY)
Admission: RE | Admit: 2019-10-07 | Discharge: 2019-10-07 | Disposition: A | Discharge status: Home / Self Care | Payer: Medicare Other | Source: Ambulatory Visit | Attending: Adult Health | Admitting: Adult Health

## 2019-10-08 LAB — NOVEL CORONAVIRUS, NAA (HOSP ORDER, SEND-OUT TO REF LAB; TAT 18-24 HRS): SARS-CoV-2, NAA: NOT DETECTED

## 2019-10-11 ENCOUNTER — Encounter: Payer: Self-pay | Admitting: Internal Medicine

## 2019-10-11 NOTE — Assessment & Plan Note (Signed)
Chronic and stable; continue Senokot 8.6 mg 2 p.o. daily

## 2019-10-11 NOTE — Assessment & Plan Note (Signed)
Last TSH 0.69; continue Synthroid 200 mcg daily

## 2019-10-11 NOTE — Assessment & Plan Note (Signed)
Most recent Dilantin level is 11 which is therapeutic; continue Dilantin 100 mg twice daily

## 2019-10-17 DIAGNOSIS — I5042 Chronic combined systolic (congestive) and diastolic (congestive) heart failure: Secondary | ICD-10-CM | POA: Diagnosis not present

## 2019-10-17 DIAGNOSIS — I4891 Unspecified atrial fibrillation: Secondary | ICD-10-CM | POA: Diagnosis not present

## 2019-10-17 DIAGNOSIS — Z23 Encounter for immunization: Secondary | ICD-10-CM | POA: Diagnosis not present

## 2019-10-17 DIAGNOSIS — Z1159 Encounter for screening for other viral diseases: Secondary | ICD-10-CM | POA: Diagnosis not present

## 2019-10-25 DIAGNOSIS — Z1159 Encounter for screening for other viral diseases: Secondary | ICD-10-CM | POA: Diagnosis not present

## 2019-10-25 DIAGNOSIS — I5042 Chronic combined systolic (congestive) and diastolic (congestive) heart failure: Secondary | ICD-10-CM | POA: Diagnosis not present

## 2019-10-25 DIAGNOSIS — I4891 Unspecified atrial fibrillation: Secondary | ICD-10-CM | POA: Diagnosis not present

## 2019-11-01 DIAGNOSIS — Z1159 Encounter for screening for other viral diseases: Secondary | ICD-10-CM | POA: Diagnosis not present

## 2019-11-01 DIAGNOSIS — I4891 Unspecified atrial fibrillation: Secondary | ICD-10-CM | POA: Diagnosis not present

## 2019-11-01 DIAGNOSIS — I5042 Chronic combined systolic (congestive) and diastolic (congestive) heart failure: Secondary | ICD-10-CM | POA: Diagnosis not present

## 2019-11-08 ENCOUNTER — Encounter: Payer: Self-pay | Admitting: Internal Medicine

## 2019-11-08 ENCOUNTER — Non-Acute Institutional Stay (SKILLED_NURSING_FACILITY): Payer: Medicare Other | Admitting: Internal Medicine

## 2019-11-08 DIAGNOSIS — F419 Anxiety disorder, unspecified: Secondary | ICD-10-CM | POA: Diagnosis not present

## 2019-11-08 DIAGNOSIS — I5042 Chronic combined systolic (congestive) and diastolic (congestive) heart failure: Secondary | ICD-10-CM

## 2019-11-08 DIAGNOSIS — G40909 Epilepsy, unspecified, not intractable, without status epilepticus: Secondary | ICD-10-CM

## 2019-11-08 DIAGNOSIS — I4891 Unspecified atrial fibrillation: Secondary | ICD-10-CM | POA: Diagnosis not present

## 2019-11-08 DIAGNOSIS — F0151 Vascular dementia with behavioral disturbance: Secondary | ICD-10-CM | POA: Diagnosis not present

## 2019-11-08 DIAGNOSIS — Z1159 Encounter for screening for other viral diseases: Secondary | ICD-10-CM | POA: Diagnosis not present

## 2019-11-08 DIAGNOSIS — F01518 Vascular dementia, unspecified severity, with other behavioral disturbance: Secondary | ICD-10-CM
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
## 2019-11-10 ENCOUNTER — Encounter: Payer: Self-pay | Admitting: Internal Medicine

## 2019-11-14 DIAGNOSIS — Z23 Encounter for immunization: Secondary | ICD-10-CM | POA: Diagnosis not present

## 2019-11-15 DIAGNOSIS — F4322 Adjustment disorder with anxiety: Secondary | ICD-10-CM | POA: Diagnosis not present

## 2019-11-15 DIAGNOSIS — F0391 Unspecified dementia with behavioral disturbance: Secondary | ICD-10-CM | POA: Diagnosis not present

## 2019-11-15 DIAGNOSIS — I4891 Unspecified atrial fibrillation: Secondary | ICD-10-CM | POA: Diagnosis not present

## 2019-11-15 DIAGNOSIS — I5042 Chronic combined systolic (congestive) and diastolic (congestive) heart failure: Secondary | ICD-10-CM | POA: Diagnosis not present

## 2019-11-15 DIAGNOSIS — Z1159 Encounter for screening for other viral diseases: Secondary | ICD-10-CM | POA: Diagnosis not present

## 2019-11-23 DIAGNOSIS — I5042 Chronic combined systolic (congestive) and diastolic (congestive) heart failure: Secondary | ICD-10-CM | POA: Diagnosis not present

## 2019-11-23 DIAGNOSIS — Z1159 Encounter for screening for other viral diseases: Secondary | ICD-10-CM | POA: Diagnosis not present

## 2019-11-23 DIAGNOSIS — I4891 Unspecified atrial fibrillation: Secondary | ICD-10-CM | POA: Diagnosis not present

## 2019-11-30 DIAGNOSIS — I5042 Chronic combined systolic (congestive) and diastolic (congestive) heart failure: Secondary | ICD-10-CM | POA: Diagnosis not present

## 2019-11-30 DIAGNOSIS — I4891 Unspecified atrial fibrillation: Secondary | ICD-10-CM | POA: Diagnosis not present

## 2019-11-30 DIAGNOSIS — Z1159 Encounter for screening for other viral diseases: Secondary | ICD-10-CM | POA: Diagnosis not present

## 2019-12-07 ENCOUNTER — Encounter: Payer: Self-pay | Admitting: Adult Health

## 2019-12-07 ENCOUNTER — Non-Acute Institutional Stay (SKILLED_NURSING_FACILITY): Payer: Medicare Other | Admitting: Adult Health

## 2019-12-07 DIAGNOSIS — G40909 Epilepsy, unspecified, not intractable, without status epilepticus: Secondary | ICD-10-CM | POA: Diagnosis not present

## 2019-12-07 DIAGNOSIS — E876 Hypokalemia: Secondary | ICD-10-CM | POA: Diagnosis not present

## 2019-12-07 DIAGNOSIS — E038 Other specified hypothyroidism: Secondary | ICD-10-CM
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
## 2019-12-12 DIAGNOSIS — I5042 Chronic combined systolic (congestive) and diastolic (congestive) heart failure: Secondary | ICD-10-CM | POA: Diagnosis not present

## 2019-12-12 DIAGNOSIS — I4891 Unspecified atrial fibrillation: Secondary | ICD-10-CM | POA: Diagnosis not present

## 2019-12-12 DIAGNOSIS — Z1159 Encounter for screening for other viral diseases: Secondary | ICD-10-CM | POA: Diagnosis not present

## 2019-12-20 DIAGNOSIS — I5042 Chronic combined systolic (congestive) and diastolic (congestive) heart failure: Secondary | ICD-10-CM | POA: Diagnosis not present

## 2019-12-20 DIAGNOSIS — Z1159 Encounter for screening for other viral diseases: Secondary | ICD-10-CM | POA: Diagnosis not present

## 2019-12-20 DIAGNOSIS — I4891 Unspecified atrial fibrillation: Secondary | ICD-10-CM | POA: Diagnosis not present

## 2019-12-26 DIAGNOSIS — I4891 Unspecified atrial fibrillation: Secondary | ICD-10-CM | POA: Diagnosis not present

## 2019-12-26 DIAGNOSIS — I5042 Chronic combined systolic (congestive) and diastolic (congestive) heart failure: Secondary | ICD-10-CM | POA: Diagnosis not present

## 2019-12-26 DIAGNOSIS — Z1159 Encounter for screening for other viral diseases: Secondary | ICD-10-CM | POA: Diagnosis not present

## 2019-12-28 ENCOUNTER — Encounter: Payer: Self-pay | Admitting: Adult Health

## 2019-12-28 NOTE — Progress Notes (Signed)
This encounter was created in error - please disregard.

## 2019-12-31 ENCOUNTER — Non-Acute Institutional Stay (SKILLED_NURSING_FACILITY): Payer: Medicare Other | Admitting: Adult Health

## 2019-12-31 ENCOUNTER — Encounter: Payer: Self-pay | Admitting: Adult Health

## 2019-12-31 DIAGNOSIS — F01518 Vascular dementia, unspecified severity, with other behavioral disturbance: Secondary | ICD-10-CM

## 2019-12-31 DIAGNOSIS — I110 Hypertensive heart disease with heart failure: Secondary | ICD-10-CM

## 2019-12-31 DIAGNOSIS — Z0000 Encounter for general adult medical examination without abnormal findings: Secondary | ICD-10-CM | POA: Diagnosis not present

## 2019-12-31 DIAGNOSIS — I5042 Chronic combined systolic (congestive) and diastolic (congestive) heart failure: Secondary | ICD-10-CM | POA: Diagnosis not present

## 2019-12-31 DIAGNOSIS — I272 Pulmonary hypertension, unspecified: Secondary | ICD-10-CM | POA: Diagnosis not present

## 2019-12-31 DIAGNOSIS — G40909 Epilepsy, unspecified, not intractable, without status epilepticus: Secondary | ICD-10-CM

## 2019-12-31 DIAGNOSIS — F0151 Vascular dementia with behavioral disturbance: Secondary | ICD-10-CM

## 2019-12-31 NOTE — Patient Instructions (Signed)
  Erica [name] , Thank you for taking time to come for your Medicare Wellness Visit. I appreciate your ongoing commitment to your health goals. Please review the following plan we discussed and let me know if I can assist you in the future.   These are the goals we discussed: Goals    . Follow up with Primary Care Provider       This is a list of the screening recommended for you and due dates:  Health Maintenance  Topic Date Due  . Urine Protein Check  Never done  . Tetanus Vaccine  06/29/2029  . Flu Shot  Completed  . Pneumonia vaccines  Completed  . DEXA scan (bone density measurement)  Discontinued
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
## 2020-01-02 ENCOUNTER — Encounter: Payer: Self-pay | Admitting: Internal Medicine

## 2020-01-02 ENCOUNTER — Non-Acute Institutional Stay (SKILLED_NURSING_FACILITY): Payer: Medicare Other | Admitting: Internal Medicine

## 2020-01-02 DIAGNOSIS — F0151 Vascular dementia with behavioral disturbance: Secondary | ICD-10-CM

## 2020-01-02 DIAGNOSIS — F419 Anxiety disorder, unspecified: Secondary | ICD-10-CM | POA: Diagnosis not present

## 2020-01-02 DIAGNOSIS — E876 Hypokalemia: Secondary | ICD-10-CM

## 2020-01-02 DIAGNOSIS — F01518 Vascular dementia, unspecified severity, with other behavioral disturbance: Secondary | ICD-10-CM
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
## 2020-01-06 ENCOUNTER — Encounter: Payer: Self-pay | Admitting: Internal Medicine

## 2020-01-06 NOTE — Assessment & Plan Note (Signed)
Chronic and stable; continue potassium 20 mEq a daily along with the use of potassium sparing spironolactone and his diuretic

## 2020-01-06 NOTE — Assessment & Plan Note (Signed)
Stable onno dementia specific medications; continue supportive care

## 2020-01-06 NOTE — Assessment & Plan Note (Signed)
There is stable; continue BuSpar 10 mg daily

## 2020-01-15 DIAGNOSIS — Z1159 Encounter for screening for other viral diseases: Secondary | ICD-10-CM | POA: Diagnosis not present

## 2020-01-15 DIAGNOSIS — I5042 Chronic combined systolic (congestive) and diastolic (congestive) heart failure: Secondary | ICD-10-CM | POA: Diagnosis not present

## 2020-01-22 DIAGNOSIS — F4322 Adjustment disorder with anxiety: Secondary | ICD-10-CM | POA: Diagnosis not present

## 2020-01-22 DIAGNOSIS — F0391 Unspecified dementia with behavioral disturbance: Secondary | ICD-10-CM | POA: Diagnosis not present

## 2020-02-06 ENCOUNTER — Non-Acute Institutional Stay (SKILLED_NURSING_FACILITY): Payer: Medicare Other | Admitting: Adult Health

## 2020-02-06 ENCOUNTER — Encounter: Payer: Self-pay | Admitting: Adult Health

## 2020-02-06 DIAGNOSIS — F01518 Vascular dementia, unspecified severity, with other behavioral disturbance: Secondary | ICD-10-CM

## 2020-02-06 DIAGNOSIS — F0151 Vascular dementia with behavioral disturbance: Secondary | ICD-10-CM | POA: Diagnosis not present

## 2020-02-06 DIAGNOSIS — I272 Pulmonary hypertension, unspecified: Secondary | ICD-10-CM | POA: Diagnosis not present

## 2020-02-06 DIAGNOSIS — I4891 Unspecified atrial fibrillation: Secondary | ICD-10-CM | POA: Diagnosis not present

## 2020-02-06 DIAGNOSIS — I5042 Chronic combined systolic (congestive) and diastolic (congestive) heart failure: Secondary | ICD-10-CM | POA: Diagnosis not present

## 2020-02-06 DIAGNOSIS — Z1159 Encounter for screening for other viral diseases: Secondary | ICD-10-CM | POA: Diagnosis not present
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
## 2020-03-05 ENCOUNTER — Non-Acute Institutional Stay (SKILLED_NURSING_FACILITY): Payer: Medicare Other | Admitting: Adult Health

## 2020-03-05 ENCOUNTER — Encounter: Payer: Self-pay | Admitting: Adult Health

## 2020-03-05 DIAGNOSIS — E441 Mild protein-calorie malnutrition: Secondary | ICD-10-CM | POA: Diagnosis not present

## 2020-03-05 DIAGNOSIS — F419 Anxiety disorder, unspecified: Secondary | ICD-10-CM

## 2020-03-05 DIAGNOSIS — I5042 Chronic combined systolic (congestive) and diastolic (congestive) heart failure: Secondary | ICD-10-CM
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
## 2020-03-06 ENCOUNTER — Encounter: Payer: Self-pay | Admitting: Adult Health

## 2020-03-06 ENCOUNTER — Non-Acute Institutional Stay (SKILLED_NURSING_FACILITY): Payer: Medicare Other | Admitting: Adult Health

## 2020-03-06 DIAGNOSIS — F01518 Vascular dementia, unspecified severity, with other behavioral disturbance: Secondary | ICD-10-CM

## 2020-03-06 DIAGNOSIS — F0151 Vascular dementia with behavioral disturbance: Secondary | ICD-10-CM

## 2020-03-06 DIAGNOSIS — G40909 Epilepsy, unspecified, not intractable, without status epilepticus: Secondary | ICD-10-CM

## 2020-03-06 DIAGNOSIS — I5042 Chronic combined systolic (congestive) and diastolic (congestive) heart failure: Secondary | ICD-10-CM
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
## 2020-03-13 ENCOUNTER — Other Ambulatory Visit (HOSPITAL_COMMUNITY)
Admission: RE | Admit: 2020-03-13 | Discharge: 2020-03-13 | Disposition: A | Discharge status: Home / Self Care | Payer: Medicare Other | Source: Skilled Nursing Facility | Attending: Adult Health | Admitting: Adult Health

## 2020-03-13 DIAGNOSIS — I5042 Chronic combined systolic (congestive) and diastolic (congestive) heart failure: Secondary | ICD-10-CM | POA: Insufficient documentation

## 2020-03-13 LAB — PHENYTOIN LEVEL, TOTAL: Phenytoin Lvl: 6.2 ug/mL — ABNORMAL LOW (ref 10.0–20.0)

## 2020-03-13 LAB — COMPREHENSIVE METABOLIC PANEL
ALT: 12 U/L (ref 0–44)
AST: 18 U/L (ref 15–41)
Albumin: 3.1 g/dL — ABNORMAL LOW (ref 3.5–5.0)
Alkaline Phosphatase: 97 U/L (ref 38–126)
Anion gap: 8 (ref 5–15)
BUN: 19 mg/dL (ref 8–23)
CO2: 26 mmol/L (ref 22–32)
Calcium: 8.2 mg/dL — ABNORMAL LOW (ref 8.9–10.3)
Chloride: 101 mmol/L (ref 98–111)
Creatinine, Ser: 0.66 mg/dL (ref 0.44–1.00)
GFR calc Af Amer: >60 mL/min (ref >60)
GFR calc non Af Amer: >60 mL/min (ref >60)
Glucose, Bld: 106 mg/dL — ABNORMAL HIGH (ref 70–99)
Potassium: 4.3 mmol/L (ref 3.5–5.1)
Sodium: 135 mmol/L (ref 135–145)
Total Bilirubin: 0.4 mg/dL (ref 0.3–1.2)
Total Protein: 5.9 g/dL — ABNORMAL LOW (ref 6.5–8.1)

## 2020-03-13 LAB — CBC
HCT: 40.7 % (ref 36.0–46.0)
Hemoglobin: 13.2 g/dL (ref 12.0–15.0)
MCH: 32.8 pg (ref 26.0–34.0)
MCHC: 32.4 g/dL (ref 30.0–36.0)
MCV: 101.2 fL — ABNORMAL HIGH (ref 80.0–100.0)
Platelets: 154 10*3/uL (ref 150–400)
RBC: 4.02 MIL/uL (ref 3.87–5.11)
RDW: 13.2 % (ref 11.5–15.5)
WBC: 5 10*3/uL (ref 4.0–10.5)
nRBC: 0 % (ref 0.0–0.2)

## 2020-03-13 LAB — TSH: TSH: 5.541 u[IU]/mL — ABNORMAL HIGH (ref 0.350–4.500)

## 2020-03-21 DIAGNOSIS — F4322 Adjustment disorder with anxiety: Secondary | ICD-10-CM | POA: Diagnosis not present

## 2020-03-21 DIAGNOSIS — F0391 Unspecified dementia with behavioral disturbance: Secondary | ICD-10-CM | POA: Diagnosis not present

## 2020-04-02 ENCOUNTER — Non-Acute Institutional Stay (SKILLED_NURSING_FACILITY): Payer: Medicare Other | Admitting: Adult Health

## 2020-04-02 ENCOUNTER — Encounter: Payer: Self-pay | Admitting: Adult Health

## 2020-04-02 DIAGNOSIS — E876 Hypokalemia: Secondary | ICD-10-CM | POA: Diagnosis not present

## 2020-04-02 DIAGNOSIS — K5909 Other constipation: Secondary | ICD-10-CM

## 2020-04-02 DIAGNOSIS — E038 Other specified hypothyroidism: Secondary | ICD-10-CM
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
## 2020-05-05 ENCOUNTER — Encounter: Payer: Self-pay | Admitting: Adult Health

## 2020-05-05 ENCOUNTER — Other Ambulatory Visit (HOSPITAL_COMMUNITY)
Admission: RE | Admit: 2020-05-05 | Discharge: 2020-05-05 | Disposition: A | Discharge status: Home / Self Care | Payer: Medicare Other | Source: Skilled Nursing Facility | Attending: Adult Health | Admitting: Adult Health

## 2020-05-05 ENCOUNTER — Non-Acute Institutional Stay (SKILLED_NURSING_FACILITY): Payer: Medicare Other | Admitting: Adult Health

## 2020-05-05 DIAGNOSIS — G40909 Epilepsy, unspecified, not intractable, without status epilepticus: Secondary | ICD-10-CM

## 2020-05-05 DIAGNOSIS — I272 Pulmonary hypertension, unspecified: Secondary | ICD-10-CM

## 2020-05-05 DIAGNOSIS — F01518 Vascular dementia, unspecified severity, with other behavioral disturbance: Secondary | ICD-10-CM

## 2020-05-05 DIAGNOSIS — F0151 Vascular dementia with behavioral disturbance: Secondary | ICD-10-CM

## 2020-05-05 DIAGNOSIS — R569 Unspecified convulsions: Secondary | ICD-10-CM | POA: Diagnosis not present

## 2020-05-05 LAB — PHENYTOIN LEVEL, TOTAL: Phenytoin Lvl: 5.4 ug/mL — ABNORMAL LOW (ref 10.0–20.0)
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
## 2020-05-06 ENCOUNTER — Encounter: Payer: Self-pay | Admitting: Adult Health

## 2020-05-06 ENCOUNTER — Non-Acute Institutional Stay (SKILLED_NURSING_FACILITY): Payer: Medicare Other | Admitting: Adult Health

## 2020-05-06 DIAGNOSIS — G40909 Epilepsy, unspecified, not intractable, without status epilepticus: Secondary | ICD-10-CM | POA: Diagnosis not present
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
## 2020-05-15 ENCOUNTER — Other Ambulatory Visit (HOSPITAL_COMMUNITY)
Admission: RE | Admit: 2020-05-15 | Discharge: 2020-05-15 | Disposition: A | Discharge status: Home / Self Care | Payer: Medicare Other | Source: Ambulatory Visit | Attending: Adult Health | Admitting: Adult Health

## 2020-05-15 DIAGNOSIS — R569 Unspecified convulsions: Secondary | ICD-10-CM | POA: Diagnosis not present

## 2020-05-15 LAB — PHENYTOIN LEVEL, TOTAL: Phenytoin Lvl: 15 ug/mL (ref 10.0–20.0)

## 2020-05-29 DIAGNOSIS — Z1159 Encounter for screening for other viral diseases: Secondary | ICD-10-CM | POA: Diagnosis not present

## 2020-05-29 DIAGNOSIS — I5042 Chronic combined systolic (congestive) and diastolic (congestive) heart failure: Secondary | ICD-10-CM | POA: Diagnosis not present

## 2020-05-29 DIAGNOSIS — I4891 Unspecified atrial fibrillation: Secondary | ICD-10-CM | POA: Diagnosis not present

## 2020-06-02 DIAGNOSIS — I5042 Chronic combined systolic (congestive) and diastolic (congestive) heart failure: Secondary | ICD-10-CM | POA: Diagnosis not present

## 2020-06-02 DIAGNOSIS — Z1159 Encounter for screening for other viral diseases: Secondary | ICD-10-CM | POA: Diagnosis not present

## 2020-06-02 DIAGNOSIS — I4891 Unspecified atrial fibrillation: Secondary | ICD-10-CM | POA: Diagnosis not present

## 2020-06-05 ENCOUNTER — Non-Acute Institutional Stay (SKILLED_NURSING_FACILITY): Payer: Medicare Other | Admitting: Adult Health

## 2020-06-05 ENCOUNTER — Encounter: Payer: Self-pay | Admitting: Adult Health

## 2020-06-05 DIAGNOSIS — I272 Pulmonary hypertension, unspecified: Secondary | ICD-10-CM

## 2020-06-05 DIAGNOSIS — F0151 Vascular dementia with behavioral disturbance: Secondary | ICD-10-CM

## 2020-06-05 DIAGNOSIS — G40909 Epilepsy, unspecified, not intractable, without status epilepticus: Secondary | ICD-10-CM

## 2020-06-05 DIAGNOSIS — F01518 Vascular dementia, unspecified severity, with other behavioral disturbance: Secondary | ICD-10-CM
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
## 2020-06-06 ENCOUNTER — Non-Acute Institutional Stay: Payer: Self-pay | Admitting: Adult Health

## 2020-06-06 ENCOUNTER — Encounter: Payer: Self-pay | Admitting: Adult Health

## 2020-06-06 DIAGNOSIS — F0151 Vascular dementia with behavioral disturbance: Secondary | ICD-10-CM | POA: Diagnosis not present

## 2020-06-06 DIAGNOSIS — F01518 Vascular dementia, unspecified severity, with other behavioral disturbance: Secondary | ICD-10-CM

## 2020-06-06 DIAGNOSIS — F339 Major depressive disorder, recurrent, unspecified: Secondary | ICD-10-CM | POA: Diagnosis not present

## 2020-06-06 DIAGNOSIS — R634 Abnormal weight loss: Secondary | ICD-10-CM | POA: Diagnosis not present

## 2020-06-09 DIAGNOSIS — Z1159 Encounter for screening for other viral diseases: Secondary | ICD-10-CM | POA: Diagnosis not present

## 2020-06-09 DIAGNOSIS — I4891 Unspecified atrial fibrillation: Secondary | ICD-10-CM | POA: Diagnosis not present

## 2020-06-09 DIAGNOSIS — I5042 Chronic combined systolic (congestive) and diastolic (congestive) heart failure: Secondary | ICD-10-CM | POA: Diagnosis not present

## 2020-06-10 ENCOUNTER — Non-Acute Institutional Stay (SKILLED_NURSING_FACILITY): Payer: Medicare Other | Admitting: Adult Health

## 2020-06-10 ENCOUNTER — Encounter: Payer: Self-pay | Admitting: Adult Health

## 2020-06-10 DIAGNOSIS — I5042 Chronic combined systolic (congestive) and diastolic (congestive) heart failure: Secondary | ICD-10-CM | POA: Diagnosis not present

## 2020-06-10 DIAGNOSIS — F01518 Vascular dementia, unspecified severity, with other behavioral disturbance: Secondary | ICD-10-CM

## 2020-06-10 DIAGNOSIS — I4891 Unspecified atrial fibrillation: Secondary | ICD-10-CM | POA: Diagnosis not present

## 2020-06-10 DIAGNOSIS — F0151 Vascular dementia with behavioral disturbance: Secondary | ICD-10-CM

## 2020-06-10 DIAGNOSIS — F339 Major depressive disorder, recurrent, unspecified: Secondary | ICD-10-CM | POA: Insufficient documentation
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
## 2020-06-12 DIAGNOSIS — I4891 Unspecified atrial fibrillation: Secondary | ICD-10-CM | POA: Diagnosis not present

## 2020-06-12 DIAGNOSIS — Z1159 Encounter for screening for other viral diseases: Secondary | ICD-10-CM | POA: Diagnosis not present

## 2020-06-12 DIAGNOSIS — I5042 Chronic combined systolic (congestive) and diastolic (congestive) heart failure: Secondary | ICD-10-CM | POA: Diagnosis not present

## 2020-06-16 DIAGNOSIS — I4891 Unspecified atrial fibrillation: Secondary | ICD-10-CM | POA: Diagnosis not present

## 2020-06-16 DIAGNOSIS — I5042 Chronic combined systolic (congestive) and diastolic (congestive) heart failure: Secondary | ICD-10-CM | POA: Diagnosis not present

## 2020-06-16 DIAGNOSIS — Z1159 Encounter for screening for other viral diseases: Secondary | ICD-10-CM | POA: Diagnosis not present

## 2020-06-20 DIAGNOSIS — Z1159 Encounter for screening for other viral diseases: Secondary | ICD-10-CM | POA: Diagnosis not present

## 2020-06-20 DIAGNOSIS — I5042 Chronic combined systolic (congestive) and diastolic (congestive) heart failure: Secondary | ICD-10-CM | POA: Diagnosis not present

## 2020-06-20 DIAGNOSIS — I4891 Unspecified atrial fibrillation: Secondary | ICD-10-CM | POA: Diagnosis not present

## 2020-06-23 DIAGNOSIS — I5042 Chronic combined systolic (congestive) and diastolic (congestive) heart failure: Secondary | ICD-10-CM | POA: Diagnosis not present

## 2020-06-23 DIAGNOSIS — I4891 Unspecified atrial fibrillation: Secondary | ICD-10-CM | POA: Diagnosis not present

## 2020-06-23 DIAGNOSIS — Z1159 Encounter for screening for other viral diseases: Secondary | ICD-10-CM | POA: Diagnosis not present

## 2020-06-26 ENCOUNTER — Encounter: Payer: Self-pay | Admitting: Adult Health

## 2020-06-26 ENCOUNTER — Non-Acute Institutional Stay (SKILLED_NURSING_FACILITY): Payer: Medicare Other | Admitting: Adult Health

## 2020-06-26 DIAGNOSIS — F0151 Vascular dementia with behavioral disturbance: Secondary | ICD-10-CM | POA: Diagnosis not present

## 2020-06-26 DIAGNOSIS — I5042 Chronic combined systolic (congestive) and diastolic (congestive) heart failure: Secondary | ICD-10-CM | POA: Diagnosis not present

## 2020-06-26 DIAGNOSIS — I4891 Unspecified atrial fibrillation: Secondary | ICD-10-CM | POA: Diagnosis not present

## 2020-06-26 DIAGNOSIS — F01518 Vascular dementia, unspecified severity, with other behavioral disturbance: Secondary | ICD-10-CM

## 2020-06-26 DIAGNOSIS — Z1159 Encounter for screening for other viral diseases: Secondary | ICD-10-CM | POA: Diagnosis not present
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
## 2020-06-27 ENCOUNTER — Non-Acute Institutional Stay (SKILLED_NURSING_FACILITY): Payer: Medicare Other | Admitting: Adult Health

## 2020-06-27 ENCOUNTER — Encounter: Payer: Self-pay | Admitting: Adult Health

## 2020-06-27 ENCOUNTER — Encounter (HOSPITAL_COMMUNITY)
Admission: AD | Admit: 2020-06-27 | Discharge: 2020-06-27 | Disposition: A | Discharge status: Home / Self Care | Payer: Medicare Other | Source: Skilled Nursing Facility | Attending: Adult Health | Admitting: Adult Health

## 2020-06-27 DIAGNOSIS — F01518 Vascular dementia, unspecified severity, with other behavioral disturbance: Secondary | ICD-10-CM

## 2020-06-27 DIAGNOSIS — R627 Adult failure to thrive: Secondary | ICD-10-CM | POA: Diagnosis not present

## 2020-06-27 DIAGNOSIS — F4322 Adjustment disorder with anxiety: Secondary | ICD-10-CM | POA: Diagnosis not present

## 2020-06-27 DIAGNOSIS — I5042 Chronic combined systolic (congestive) and diastolic (congestive) heart failure: Secondary | ICD-10-CM | POA: Diagnosis not present

## 2020-06-27 DIAGNOSIS — G40909 Epilepsy, unspecified, not intractable, without status epilepticus: Secondary | ICD-10-CM | POA: Diagnosis not present

## 2020-06-27 DIAGNOSIS — F0391 Unspecified dementia with behavioral disturbance: Secondary | ICD-10-CM | POA: Diagnosis not present

## 2020-06-27 DIAGNOSIS — F0151 Vascular dementia with behavioral disturbance: Secondary | ICD-10-CM | POA: Diagnosis not present

## 2020-06-27 LAB — PHENYTOIN LEVEL, TOTAL: Phenytoin Lvl: 30.5 ug/mL (ref 10.0–20.0)

## 2020-06-27 LAB — COMPREHENSIVE METABOLIC PANEL
ALT: 17 U/L (ref 0–44)
AST: 22 U/L (ref 15–41)
Albumin: 3.6 g/dL (ref 3.5–5.0)
Alkaline Phosphatase: 101 U/L (ref 38–126)
Anion gap: 9 (ref 5–15)
BUN: 27 mg/dL — ABNORMAL HIGH (ref 8–23)
CO2: 28 mmol/L (ref 22–32)
Calcium: 9 mg/dL (ref 8.9–10.3)
Chloride: 117 mmol/L — ABNORMAL HIGH (ref 98–111)
Creatinine, Ser: 0.74 mg/dL (ref 0.44–1.00)
GFR calc Af Amer: >60 mL/min (ref >60)
GFR calc non Af Amer: >60 mL/min (ref >60)
Glucose, Bld: 113 mg/dL — ABNORMAL HIGH (ref 70–99)
Potassium: 4.5 mmol/L (ref 3.5–5.1)
Sodium: 154 mmol/L — ABNORMAL HIGH (ref 135–145)
Total Bilirubin: 1.2 mg/dL (ref 0.3–1.2)
Total Protein: 6.7 g/dL (ref 6.5–8.1)

## 2020-06-27 LAB — CBC
HCT: 52.1 % — ABNORMAL HIGH (ref 36.0–46.0)
Hemoglobin: 16.2 g/dL — ABNORMAL HIGH (ref 12.0–15.0)
MCH: 32.5 pg (ref 26.0–34.0)
MCHC: 31.1 g/dL (ref 30.0–36.0)
MCV: 104.6 fL — ABNORMAL HIGH (ref 80.0–100.0)
Platelets: 177 10*3/uL (ref 150–400)
RBC: 4.98 MIL/uL (ref 3.87–5.11)
RDW: 15.9 % — ABNORMAL HIGH (ref 11.5–15.5)
WBC: 9.2 10*3/uL (ref 4.0–10.5)
nRBC: 0 % (ref 0.0–0.2)
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
## 2020-06-28 ENCOUNTER — Telehealth: Payer: Self-pay | Admitting: Family

## 2020-06-28 NOTE — Telephone Encounter (Signed)
Late Entry 06/27/2020 : Facility Nurse called on call provider stated dilantin level 30.5 states was seen earlier  today by provider Jerene Dilling and dilantin was decreased to 150 mg twice daily then 100 bid today then to 50 mg twice daily on 08/04/2020.States patient now on comfort care.Advised to hold dilantin level then recheck on 06/29/2020 but Nurse states NP prefers labs to be done on weekdays instead of weekends.Advised to check dilantin level 06/30/2020.

## 2020-06-30 ENCOUNTER — Encounter (HOSPITAL_COMMUNITY)
Admission: RE | Admit: 2020-06-30 | Discharge: 2020-06-30 | Disposition: A | Discharge status: Home / Self Care | Payer: Medicare Other | Source: Skilled Nursing Facility | Attending: Adult Health | Admitting: Adult Health

## 2020-06-30 DIAGNOSIS — I5042 Chronic combined systolic (congestive) and diastolic (congestive) heart failure: Secondary | ICD-10-CM | POA: Diagnosis not present

## 2020-06-30 DIAGNOSIS — I4891 Unspecified atrial fibrillation: Secondary | ICD-10-CM | POA: Diagnosis not present

## 2020-06-30 DIAGNOSIS — Z1159 Encounter for screening for other viral diseases: Secondary | ICD-10-CM | POA: Diagnosis not present

## 2020-06-30 DIAGNOSIS — R627 Adult failure to thrive: Secondary | ICD-10-CM | POA: Insufficient documentation

## 2020-06-30 LAB — PHENYTOIN LEVEL, TOTAL: Phenytoin Lvl: 21.7 ug/mL — ABNORMAL HIGH (ref 10.0–20.0)

## 2020-07-09 ENCOUNTER — Non-Acute Institutional Stay (SKILLED_NURSING_FACILITY): Payer: Medicare Other | Admitting: Adult Health

## 2020-07-09 ENCOUNTER — Encounter: Payer: Self-pay | Admitting: Adult Health

## 2020-07-09 DIAGNOSIS — I5042 Chronic combined systolic (congestive) and diastolic (congestive) heart failure: Secondary | ICD-10-CM

## 2020-07-09 DIAGNOSIS — F0151 Vascular dementia with behavioral disturbance: Secondary | ICD-10-CM

## 2020-07-09 DIAGNOSIS — K5909 Other constipation: Secondary | ICD-10-CM

## 2020-07-09 DIAGNOSIS — F01518 Vascular dementia, unspecified severity, with other behavioral disturbance: Secondary | ICD-10-CM

## 2020-07-09 DIAGNOSIS — I4891 Unspecified atrial fibrillation: Secondary | ICD-10-CM | POA: Diagnosis not present

## 2020-07-09 DIAGNOSIS — E038 Other specified hypothyroidism: Secondary | ICD-10-CM

## 2020-07-09 DIAGNOSIS — R627 Adult failure to thrive: Secondary | ICD-10-CM

## 2020-07-09 DIAGNOSIS — G40909 Epilepsy, unspecified, not intractable, without status epilepticus: Secondary | ICD-10-CM | POA: Diagnosis not present

## 2020-07-09 DIAGNOSIS — I272 Pulmonary hypertension, unspecified: Secondary | ICD-10-CM | POA: Diagnosis not present
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
## 2020-07-25 DIAGNOSIS — F0391 Unspecified dementia with behavioral disturbance: Secondary | ICD-10-CM | POA: Diagnosis not present

## 2020-07-25 DIAGNOSIS — F4322 Adjustment disorder with anxiety: Secondary | ICD-10-CM | POA: Diagnosis not present

## 2020-08-01 ENCOUNTER — Non-Acute Institutional Stay (SKILLED_NURSING_FACILITY): Payer: Medicare Other | Admitting: Adult Health

## 2020-08-01 ENCOUNTER — Encounter: Payer: Self-pay | Admitting: Adult Health

## 2020-08-01 DIAGNOSIS — Z66 Do not resuscitate: Secondary | ICD-10-CM

## 2020-08-01 DIAGNOSIS — G40909 Epilepsy, unspecified, not intractable, without status epilepticus: Secondary | ICD-10-CM

## 2020-08-01 DIAGNOSIS — R627 Adult failure to thrive: Secondary | ICD-10-CM
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
## 2020-08-06 ENCOUNTER — Non-Acute Institutional Stay (SKILLED_NURSING_FACILITY): Payer: Medicare Other | Admitting: Adult Health

## 2020-08-06 ENCOUNTER — Encounter: Payer: Self-pay | Admitting: Adult Health

## 2020-08-06 DIAGNOSIS — R627 Adult failure to thrive: Secondary | ICD-10-CM | POA: Diagnosis not present

## 2020-08-06 DIAGNOSIS — G40909 Epilepsy, unspecified, not intractable, without status epilepticus: Secondary | ICD-10-CM

## 2020-08-06 DIAGNOSIS — F01518 Vascular dementia, unspecified severity, with other behavioral disturbance: Secondary | ICD-10-CM

## 2020-08-06 DIAGNOSIS — F0151 Vascular dementia with behavioral disturbance: Secondary | ICD-10-CM | POA: Diagnosis not present
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
## 2020-08-11 DEATH — deceased
